# Patient Record
Sex: Female | Born: 1945 | Race: White | Hispanic: No | Smoking: Never smoker
Health system: Southern US, Community
[De-identification: ages and names within clinical notes are randomized; demographics above are authoritative.]

## PROBLEM LIST (undated history)

## (undated) DIAGNOSIS — J019 Acute sinusitis, unspecified: Secondary | ICD-10-CM

## (undated) DIAGNOSIS — E785 Hyperlipidemia, unspecified: Secondary | ICD-10-CM

## (undated) DIAGNOSIS — Z9109 Other allergy status, other than to drugs and biological substances: Secondary | ICD-10-CM

## (undated) DIAGNOSIS — K631 Perforation of intestine (nontraumatic): Secondary | ICD-10-CM

## (undated) DIAGNOSIS — I10 Essential (primary) hypertension: Secondary | ICD-10-CM

## (undated) DIAGNOSIS — J342 Deviated nasal septum: Secondary | ICD-10-CM

## (undated) DIAGNOSIS — K219 Gastro-esophageal reflux disease without esophagitis: Secondary | ICD-10-CM

## (undated) HISTORY — DX: Deviated nasal septum: J34.2

## (undated) HISTORY — PX: CHOLECYSTECTOMY: SHX55

## (undated) HISTORY — DX: Gastro-esophageal reflux disease without esophagitis: K21.9

## (undated) HISTORY — PX: TONSILLECTOMY: SUR1361

## (undated) HISTORY — DX: Perforation of intestine (nontraumatic): K63.1

## (undated) HISTORY — DX: Essential (primary) hypertension: I10

## (undated) HISTORY — PX: TUBAL LIGATION: SHX77

## (undated) HISTORY — DX: Acute sinusitis, unspecified: J01.90

## (undated) HISTORY — PX: HERNIA REPAIR: SHX51

## (undated) HISTORY — DX: Hyperlipidemia, unspecified: E78.5

---

## 1999-12-14 ENCOUNTER — Encounter: Admission: RE | Admit: 1999-12-14 | Discharge: 1999-12-14 | Payer: Self-pay | Admitting: Family Medicine

## 1999-12-14 ENCOUNTER — Encounter: Payer: Self-pay | Admitting: Family Medicine

## 2000-01-25 ENCOUNTER — Other Ambulatory Visit: Admission: RE | Admit: 2000-01-25 | Discharge: 2000-01-25 | Payer: Self-pay | Admitting: Obstetrics and Gynecology

## 2001-02-18 ENCOUNTER — Other Ambulatory Visit: Admission: RE | Admit: 2001-02-18 | Discharge: 2001-02-18 | Payer: Self-pay | Admitting: Obstetrics and Gynecology

## 2002-02-25 ENCOUNTER — Other Ambulatory Visit: Admission: RE | Admit: 2002-02-25 | Discharge: 2002-02-25 | Payer: Self-pay | Admitting: Obstetrics and Gynecology

## 2002-10-02 ENCOUNTER — Encounter: Admission: RE | Admit: 2002-10-02 | Discharge: 2002-10-02 | Payer: Self-pay | Admitting: Family Medicine

## 2002-10-02 ENCOUNTER — Encounter: Payer: Self-pay | Admitting: Family Medicine

## 2004-09-25 DIAGNOSIS — K631 Perforation of intestine (nontraumatic): Secondary | ICD-10-CM

## 2004-09-25 HISTORY — PX: COLON SURGERY: SHX602

## 2004-09-25 HISTORY — DX: Perforation of intestine (nontraumatic): K63.1

## 2005-01-18 ENCOUNTER — Ambulatory Visit: Payer: Self-pay | Admitting: Internal Medicine

## 2005-02-02 ENCOUNTER — Ambulatory Visit: Payer: Self-pay | Admitting: Internal Medicine

## 2005-02-02 ENCOUNTER — Encounter (INDEPENDENT_AMBULATORY_CARE_PROVIDER_SITE_OTHER): Payer: Self-pay | Admitting: Specialist

## 2005-02-02 ENCOUNTER — Inpatient Hospital Stay (HOSPITAL_COMMUNITY): Admission: EM | Admit: 2005-02-02 | Discharge: 2005-02-16 | Payer: Self-pay | Admitting: Emergency Medicine

## 2005-02-03 ENCOUNTER — Ambulatory Visit: Payer: Self-pay | Admitting: Gastroenterology

## 2005-02-24 ENCOUNTER — Ambulatory Visit (HOSPITAL_COMMUNITY): Admission: RE | Admit: 2005-02-24 | Discharge: 2005-02-24 | Payer: Self-pay | Admitting: General Surgery

## 2006-04-05 ENCOUNTER — Other Ambulatory Visit: Admission: RE | Admit: 2006-04-05 | Discharge: 2006-04-05 | Payer: Self-pay | Admitting: Family Medicine

## 2008-02-24 ENCOUNTER — Ambulatory Visit (HOSPITAL_COMMUNITY): Admission: RE | Admit: 2008-02-24 | Discharge: 2008-02-24 | Payer: Self-pay | Admitting: Family Medicine

## 2008-02-24 ENCOUNTER — Ambulatory Visit: Payer: Self-pay | Admitting: *Deleted

## 2008-04-09 ENCOUNTER — Ambulatory Visit: Payer: Self-pay | Admitting: Cardiovascular Disease

## 2008-05-14 ENCOUNTER — Ambulatory Visit: Payer: Self-pay

## 2010-02-16 ENCOUNTER — Encounter (INDEPENDENT_AMBULATORY_CARE_PROVIDER_SITE_OTHER): Payer: Self-pay | Admitting: *Deleted

## 2010-10-27 NOTE — Letter (Signed)
Summary: Colonoscopy-Changed to Office Visit Letter  [facility]  [address] [place], [place] [address]   Phone: [phone]  Fax: [phone]      Feb 16, 2010 MRN: [id number]   Amanda Potter [address], [place]  [address]   Dear Amanda Potter,   According to our records, it is time for you to schedule a Colonoscopy. However, after reviewing your medical record, I feel that an office visit would be most appropriate to more completely evaluate you and determine your need for a repeat procedure.  Please call [phone] (option #2) at your convenience to schedule an office visit. If you have any questions, concerns, or feel that this letter is in error, we would appreciate your call.   Sincerely,  Iva Boop, M.D.  [facility] [phone]

## 2011-02-07 NOTE — Assessment & Plan Note (Signed)
[facility]                            CARDIOLOGY OFFICE NOTE   Amanda Potter, Amanda Potter                     MRN:          [id number]  DATE:04/09/2008                            DOB:          1946-03-18    Amanda Potter returned and she is seen today as a new patient per Dr. Herb Grays.  She has coronary risk factors including positive family history  of hypertension and hypercholesterolemia.  She has had chest pain and  shortness of breath.  Her pain initially occurred on about February 24, 2008.   She describes significant substernal chest pain.  It radiated more to  the right side.  It was associated with heaviness.  She had some  shortness of breath.  There was no PND or orthopnea.  She is a  nonsmoker.  There was no cough or sputum production.  She was referred  to [facility] for an upper extremity duplex, which showed no evidence of  DVT.  The pain in the chest and arm resolved after about 2-3 days.   She does on occasion get exertional dyspnea.  On occasion when she is  short of breath, there is a pressure in her chest.  However, she does a  lot of work in the yard, rides a IT trainer, and works at [employer] and in  general does fairly well without classic angina.   REVIEW OF SYSTEMS:  Otherwise negative.   PAST MEDICAL HISTORY:  Remarkable for hypertension,  hypercholesterolemia, previous gallbladder surgery, and previous  colectomy.   FAMILY HISTORY:  Positive for premature coronary artery disease on her  father's side, he died of a heart attack at a young age, less than 40.  Mother is still alive.   The patient is a Merchandiser, retail at [employer].  She is divorced.  She has 4  children, 1 of her son still lives with her.  She is otherwise fairly  sedentary.  She does not smoke or drink.   The patient has no known allergies.   CURRENT MEDICATIONS:  1. Amlodipine 10 a day.  2. Ramipril 10 a day.  3. Simvastatin 40 a day.  4. Hydrochlorothiazide 12.5  a day.   PHYSICAL EXAMINATION:  GENERAL:  Remarkable for an elderly white female  in no distress.  She looks older than her stated age.  VITAL SIGNS:  Weight is 202, blood pressure 150/80, pulse 90 and  regular, respiratory rate 14, and afebrile.  HEENT:  Unremarkable.  NECK:  Carotids are normal without bruit.  No lymphadenopathy,  thyromegaly, or JVP elevation.  LUNGS:  Clear with good diaphragmatic motion.  No wheezing.  HEART:  S1 and S2 with normal heart sounds.  PMI normal.  ABDOMEN:  Benign.  Bowel sounds positive.  No AAA.  No tenderness.  No  hepatosplenomegaly or hepatojugular reflux.  She is status post  colectomy.  No bruit.  EXTREMITIES:  Distal pulses are intact.  No edema.  NEURO:  Nonfocal.  SKIN:  Warm and dry.  MUSCULOSKELETAL:  No muscular weakness.   Her electrocardiogram shows sinus rhythm with nonspecific ST-T  wave  changes.   IMPRESSION:  1. Chest pain with multiple coronary risk factors.  Followup stress      Myoview.  2. Hypertension, currently well controlled.  Continue low-salt diet as      well as calcium blocker, angiotensin converting enzyme inhibitor,      and diuretic.  3. Hypercholesterolemia.  Continue simvastatin.  Lipid and liver      profile in 6 months.  4. Right upper extremity pain.  Duplex showed no deep vein thrombosis,      possible muscle strain versus rotator cuff injury.  Continue to      treat conservatively.  Continue anti-inflammatories.   The patient will be seen on an as-needed basis so long as her stress  Myoview is normal.     Theron Arista C. Eden Emms, MD, [facility]  Electronically Signed    PCN/MedQ  DD: 04/09/2008  DT: 04/10/2008  Job #: [id number]   cc:   Amanda Potter, M.D.

## 2011-02-10 NOTE — Op Note (Signed)
[name], [name] NO.:  [id number]   MEDICAL RECORD NO.:  [id number]          PATIENT TYPE:  INP   LOCATION:  0156                         FACILITY:  [facility]   PHYSICIAN:  Adolph Pollack, M.D.DATE OF BIRTH:  [birth year]   DATE OF PROCEDURE:  02/02/2005  DATE OF DISCHARGE:                                 OPERATIVE REPORT   PREOPERATIVE DIAGNOSIS:  Perforated colon.   POSTOPERATIVE DIAGNOSIS:  Perforated cecum.   OPERATION/PROCEDURE:  1.  Exploratory laparotomy.  2.  Right colectomy.   SURGEON:  Adolph Pollack, M.D.   ASSISTANT:  Currie Paris, M.D.   ANESTHESIA:  General.   INDICATIONS:  [name] is a 65 year old female who had a screening  colonoscopy today at approximately 2:30.  Developing worsening abdominal  pain, bloating, nausea and vomiting and x-rays were performed demonstrating  a large pneumoperitoneum.  She subsequently was brought to the operating  room for emergency exploratory laparotomy for perforated colon.   DESCRIPTION OF PROCEDURE:  She was brought to the operating room, placed  supine on the operating table and a general anesthetic was administered.  Foley catheter was placed in the bladder and nasogastric tube inserted.  The  abdominal wall was sterilely prepped and draped.   A lower midline incision was made through the skin and subcutaneous tissue,  fascia and peritoneum and a large amount of air was released.  I then  eviscerated the small bowel and examined the sigmoid colon.  Although there  was some inflammatory change and cloudy fluid in the pelvis, there was no  perforation of the sigmoid colon.  I subsequently examined the cecum and saw  a perforation in the cecal area with deserosalization.  I closed the  perforation hole with silk suture.  I subsequently mobilized the proximal  ascending colon and distal ileum.  I performed an ileocecectomy by dividing  the ascending colon just distal to the perforation  and dividing the distal  ileum.  Mesentery was divided between clamps.  She had very fragile tissue  and had a fair amount of oozing.  I sent this specimen off the field.  I  then performed a side-to-side anastomosis between the ileum and ascending  colon.  However, part of the ascending colon was ischemic.  I resected part  of this anastomosis and performed another side-to-side anastomosis and part  of the ascending colon was still ischemic.  At this time I decided to  mobilize the entire right colon and hepatic flexure which I did.  I  subsequently divided the colon with a stapler just distal to the hepatic  flexure, the proximal transverse colon and completed a formal right  hemicolectomy by dividing the mesentery between clamps and ligating the  vessels.  A side-to-side anastomosis between the distal ileum and the  transverse colon was then performed in a stapled fashion.  The remaining  enterotomy was closed with a linear non-cutting stapler.  This was oversewn  with interrupted 3-0 silk sutures and the distal staple line reinforced  with a 3-0 silk suture.  This anastomosis was patent, under no  tension.  The  mesenteric defect was then closed with interrupted 3-0 Vicryl sutures.   Gloves were changed.  The abdominal cavity was copiously irrigated.  Bleeding points were controlled with electrocautery and suture ligation.  Once hemostasis was adequate, I asked for a needle, sponge and instrument  count which was reported to be correct.  The anastomosis was once again  inspected and viability insured.  Intestines were placed back in the  abdominal cavity and omentum placed over them.  The fascia was then closed  with a running #1 PDS suture.  The subcutaneous tissue was irrigated.  The  skin was partially closed with staples and the rest was packed with moist  gauze followed by a dry dressing.   She tolerated the procedure fairly well.  She did have a blood loss of  approximately 1000  mL from just diffuse-type oozing (and she is known to  take aspirin).  She subsequently was taken to the recovery room in critical  condition and will be taken to the step-down/ICU for observation.      TJR/MEDQ  D:  02/02/2005  T:  02/03/2005  Job:  [id number]   cc:   Iva Boop, M.D. [facility]

## 2011-02-10 NOTE — Consult Note (Signed)
NAMEMALIKA, DEMARIO NO.:  [id number]   MEDICAL RECORD NO.:  [id number]          PATIENT TYPE:  EMS   LOCATION:  ED                           FACILITY:  [facility]   PHYSICIAN:  Adolph Pollack, M.D.DATE OF BIRTH:  Mar 13, 1946   DATE OF CONSULTATION:  02/02/2005  DATE OF DISCHARGE:                                   CONSULTATION   REASON FOR CONSULTATION:  Severe abdominal pain, pneumoperitoneum following  colonoscopy.   HISTORY OF PRESENT ILLNESS:  This 66 year old female presented today to  [facility] for a screening colonoscopy.  This occurred about 2:30.  She had trouble evacuating the gas and complained of lower abdominal pain  and distention.  She had some belching but could not pass flatus.  A rectal  tube was tried.  She subsequently had increasing severity of her pain and  began vomiting.  She then had abdominal x-rays performed which showed a  large pneumoperitoneum.  She was then brought to the emergency department,  and I was asked to see her.   PAST MEDICAL HISTORY:  1.  Hypertension.  2.  Hypercholesterolemia.  3.  Sinus disease.   PREVIOUS OPERATIONS:  1.  Cholecystectomy.  2.  Laparoscopic tubal ligation.  3.  Tonsillectomy.   ALLERGIES:  None.   MEDICATIONS:  Hydrochlorothiazide, Altace, Claritin, aspirin, calcium  carbonate, vitamin B12.  She is also on Zocor.   SOCIAL HISTORY:  Per her significant other, no tobacco or alcohol use.  She  does work at [employer].   REVIEW OF SYSTEMS:  It is unobtainable in her current state.   PHYSICAL EXAMINATION:  GENERAL:  A very ill-appearing female, somewhat  sedated, not able to converse much.  She has received some Phenergan.  VITAL SIGNS:  Temperature is 98.1, blood pressure 111/68, pulse 92,  respiratory rate is 28.  EYES:  Extraocular motions are intact.  There is no icterus present.  NECK:  Supple without masses or thyroid enlargement.  RESPIRATORY:  The breath sounds are  shallow but equal and clear.  CARDIOVASCULAR:  Heart demonstrates a regular rate with a regular rhythm.  No JVD noted.  No lower extremity edema.  ABDOMEN:  Distended with some lower abdominal firmness.  Tenderness to  palpation and percussion.  Hypoactive bowel sounds noted.  Right upper  quadrant scar noted.  Subumbilical scar noted.  SKIN:  Pale and cool.  EXTREMITIES:  No edema noted.  Adequate muscle tone.  NEUROLOGIC:  She is somewhat sedated after receiving some Phenergan.   LABORATORY DATA:  Potassium 3.0, glucose 163.  The rest of her electrolytes  within normal limits.  Liver function is normal.  White blood cell count  7500 but with a leftward shift.  Hemoglobin 13.4, INR normal.   Acute abdominal series x-rays demonstrate a large amount of free air  underneath the diaphragms.   IMPRESSION:  Postcolonoscopy colonic perforation.  Did have a biopsy of a  rectosigmoid polyp.   PLAN:  To the operating room for emergency exploratory laparotomy.  Possibilities include primary closure versus partial colectomy and colostomy  or partial  colectomy with anastomosis and diverting loop ileostomy.  I have  discussed the procedure and the risks with her and her son.  The risks  include but not limited to bleeding, infection, wound healing problems,  actual damage to intra-abdominal organs, risk of anesthesia.  They both seem  to understand this and are agreeable to proceeding.      TJR/MEDQ  D:  02/02/2005  T:  02/02/2005  Job:  [id number]   cc:   Iva Boop, M.D. [facility] C. Andrey Campanile, M.D.  [address]  [place]  [place] [address]  Fax: [phone]

## 2011-02-10 NOTE — Discharge Summary (Signed)
Amanda Potter, FAUX NO.:  [id number]   MEDICAL RECORD NO.:  [id number]          PATIENT TYPE:  INP   LOCATION:  1516                         [facility]ACILITY[facility]  [facility]   PHYSICIAN:  Adolph Pollack, M.D.DATE OF BIRTH:  1946/04/16   DATE OF ADMISSION:  02/02/2005  DATE OF DISCHARGE:  02/05/2005                                 DISCHARGE SUMMARY   DISCHARGE DIAGNOSIS:  Right colon perforation followed colonoscopy.   SECONDARY DIAGNOSES:  1.  Hypertension.  2.  Postoperative intraabdominal abscess.  3.  Postoperative ileus.  4.  Hypertension.  5.  Hyperglycemia.   PROCEDURE:  Emergency exploratory laparotomy with right colectomy.   REASON FOR ADMISSION:  This is a 65 year old female who had a screening  colonoscopy in the afternoon, and had lower abdominal pain, nausea and  vomiting after the procedure.  X-ray was performed which demonstrated  pneumoperitoneum.   HOSPITAL COURSE:  She was started on antibiotics, resuscitated with IV  fluids, and taken to the operating room where she underwent emergency  exploratory laparotomy and a right colectomy.  Postoperatively, she had an  NG tube, and was kept on broad-spectrum antibiotics.  Volume resuscitation  was initiated.  She is noted to have hyperglycemia with a blood sugar as  high as 213.  This is covered with some sliding scale insulin.  Her  abdominal wound was left open, and dressing changes were started.  She was  maintained on IV Zosyn, and has continued to have some fever and ileus.  I  was able to DC the NG and Foley on postop day #6, and tried a liquid diet.  She did spike a fever to 102.5 on her 7th postoperative day, and I had a CT  of the abdomen and pelvis performed which demonstrated a large fluid  collection in the right gut.  However, some smaller fluid collections were  in the pelvis along the paraduodenal area.  I had percutaneous drainage  performed of the fluid collection in the right lower  quadrant region, and  cultures performed.  All of these came back sterile.  However, her  temperature did seem to improve some after that.  I changed the antibiotics  to Primaxin for broader coverage, and she has responded to this well.  White  count was normal a few days after this.  She was starting to tolerate a  regular diet, and I started some iron for some blood-loss anemia and  hemoglobin of 8.8.  Repeat CT scan demonstrated improvement at all of the  fluid collections including the one that was drained but also some of the  others that were not.  The drain output was less, and I had to remove the  drain.  She was improving.  Her diet was advanced.  She, subsequently, on  Feb 16, 2005, was afebrile.  The wound was healing well.  She was tolerating  a diet, ambulating independently, and was felt to be ready for discharge.   DISPOSITION:  Discharged home on Feb 16, 2005.  She has been given Cipro and  Flagyl to take for continued antibiotic  coverage as well as iron and  Vicodin.  She is told to continue her home medicines.  Activity restrictions  have been given.  I have suggested that she have her blood sugar checked by  Dr. Andrey Campanile in the near future.  Home health nurses will help with her  dressing changes.  Back to see me in the office in 1-2 weeks.      TJR/MEDQ  D:  02/22/2005  T:  02/22/2005  Job:  [id number]   cc:   [place]. Andrey Campanile, M.D.  [address]  [place]  [place] [address]  Fax: [phone]   Iva Boop, M.D. [facility]

## 2011-02-10 NOTE — H&P (Signed)
NAME:  Amanda Potter, Amanda Potter NO.:  [id number]   MEDICAL RECORD NO.:  [id number]          PATIENT TYPE:  EMS   LOCATION:  ED                           FACILITY:  [facility]   PHYSICIAN:  Iva Boop, M.D. LHCDATE OF BIRTH:  [birth year]   DATE OF ADMISSION:  02/02/2005  DATE OF DISCHARGE:                                HISTORY & PHYSICAL   CHIEF COMPLAINT:  Abdominal pain, vomiting after colonoscopy and  polypectomy.   HISTORY:  Ms. Sivertson is a 65 year old white woman that presented for a  screening colonoscopy secondary to family history of colon cancer (in her  brother). The procedure was uneventful, she did have moderately severe  sigmoid diverticulosis, a rectal polyp was snared with cautery and external  hemorrhoids were noted. She could not pass air well in recovery, did not  respond to rectal tubes and began to have belching and vomiting and  worsening abdominal pain over time. Initially she seemed to improve a little  bit and then worsened. She had quite a few sweats as well. She was  transferred to the emergency department at [facility] after we  gave her 12.5 mg Phenergan IV to relieve her vomiting at the endoscopy  center. She was tender in the lower quadrants, left more than right at first  and then right more than left. Bowel sounds were present but very diminished  it seemed. Acute abdominal series taken here in the emergency department  demonstrates marked free air.   MEDICATIONS:  1.  Zocor 40 mg q.d.  2.  HCTZ 25 mg q.d.  3.  Altace 10 mg q.d.  4.  Claritin p.r.n.  5.  Vitamin B12 q.d.  6.  Calcium, magnesium and zinc supplements q.d.  7.  Mucinex DM as needed.  8.  Aspirin 81 mg per day.  9.  Aleve p.r.n.   ALLERGIES:  No known drug allergies.   PAST MEDICAL HISTORY:  1.  Hypertension.  2.  Tonsillectomy and adenoidectomy.  3.  Cholecystectomy.  4.  Tubal ligation.  5.  Dyslipidemia.   PHYSICAL EXAMINATION:  GENERAL:  At this  point, the patient is sleepy after  her Phenergan, she is arousable but really not conversant.  VITAL SIGNS:  Her pulse is 94, respirations 24, blood pressure 156/102.  LUNGS:  Clear.  HEART:  S1, S2, no rubs or gallops.  ABDOMEN:  Obese, soft, tender in the lower quadrants. There was no clear  rebound initially though she was tender to movement and could not get  comfortable. Bowel sounds were remarkably diminished.  EXTREMITIES:  No edema.   LABORATORY DATA:  Pending at this time.   ASSESSMENT:  1.  Perforated colon after colonoscopy. I am presuming is related to a      ruptured diverticulum. I think that is most likely. The polyp was low      and not that large so I do not think it is a post polypectomy      perforation.  2.  Additional medical problems include hypertension and allergies and her      previous  surgeries as listed and dyslipidemia.   PLAN:  I have called Dr. Abbey Chatters of surgery and he is coming to evaluate  the patient. Unasyn 3 g IV is given stat and intravenous fluids will be  administered. I assume she will need to go to the operating room but will  defer to Dr. Maris Berger judgment at this time. Further plans pending  clinical course. At this point, I have held her blood pressure medicines as  well as her Zocor and we will determine the need for those overtime.      CEG/MEDQ  D:  02/02/2005  T:  02/02/2005  Job:  [id number]   cc:   Adolph Pollack, M.D.  [address]  [place]  [place] [address]   [place]. Andrey Campanile, M.D.  [address]  [place]  [place] [address]  Fax: [phone]

## 2011-04-27 ENCOUNTER — Telehealth: Payer: Self-pay | Admitting: *Deleted

## 2011-04-27 NOTE — Telephone Encounter (Signed)
Called patient and left message to call back and scheduled office visit.

## 2011-05-04 NOTE — Telephone Encounter (Signed)
2nd attempt to contact the patient. Left a message on home voicemail and with emergency contact person. Noted to send the patient a letter.

## 2011-05-05 ENCOUNTER — Telehealth: Payer: Self-pay | Admitting: Gastroenterology

## 2011-05-05 NOTE — Telephone Encounter (Signed)
Patient called back stating she "DOES NOT want to schedule an Office visit with [place]."

## 2012-01-02 ENCOUNTER — Encounter: Payer: Self-pay | Admitting: Internal Medicine

## 2013-01-29 ENCOUNTER — Telehealth (INDEPENDENT_AMBULATORY_CARE_PROVIDER_SITE_OTHER): Payer: Self-pay | Admitting: General Surgery

## 2013-01-29 ENCOUNTER — Ambulatory Visit (INDEPENDENT_AMBULATORY_CARE_PROVIDER_SITE_OTHER): Payer: Medicare Other | Admitting: General Surgery

## 2013-01-29 ENCOUNTER — Encounter (INDEPENDENT_AMBULATORY_CARE_PROVIDER_SITE_OTHER): Payer: Self-pay

## 2013-01-29 ENCOUNTER — Other Ambulatory Visit (INDEPENDENT_AMBULATORY_CARE_PROVIDER_SITE_OTHER): Payer: Self-pay | Admitting: General Surgery

## 2013-01-29 ENCOUNTER — Encounter (INDEPENDENT_AMBULATORY_CARE_PROVIDER_SITE_OTHER): Payer: Self-pay | Admitting: General Surgery

## 2013-01-29 VITALS — BP 152/98 | HR 96 | Resp 16 | Ht 66.25 in | Wt 204.0 lb

## 2013-01-29 DIAGNOSIS — K432 Incisional hernia without obstruction or gangrene: Secondary | ICD-10-CM

## 2013-01-29 NOTE — Patient Instructions (Signed)
Avoid repetitive heavy lifting. 

## 2013-01-29 NOTE — Progress Notes (Signed)
Patient ID: Amanda Potter, female   DOB: Oct 20, 1945, 67 y.o.   MRN: [id number]  No chief complaint on file.   HPI Amanda Potter is a 67 y.o. female.   HPI  She is referred by Mady Gemma, physician's assistant, to evaluate a ventral hernia.  In 2006, she underwent an emergency right hemicolectomy for a perforation following colonoscopy. This was complicated by postoperative abscesses which were treated with antibiotics and drainage and eventually resolved. Over the past 3 years, she's been gaining some weight and has noticed some asymmetric bulging in her lower abdomen which is persistently been getting larger. It does not interfere with her bowel or bladder habits. She denies difficulty with urination or chronic constipation. She was referred here to evaluate this. It does not cause her a lot of pain.  Past Medical History  Diagnosis Date  . Hyperlipidemia   . Hypertension   . GERD (gastroesophageal reflux disease)   . Deviated nasal septum   . Sinusitis, acute     Perforation of colon following colonoscopy in 2006  Past surgical history: Tubal ligation, open cholecystectomy, emergency right hemicolectomy in 2006.  No family history on file.  Social History History  Substance Use Topics  . Smoking status: Never Smoker   . Smokeless tobacco: Not on file  . Alcohol Use: No    No Known Allergies  Current Outpatient Prescriptions  Medication Sig Dispense Refill  . AMLODIPINE BESYLATE PO Take 10 mg by mouth.      Marland Kitchen aspirin 81 MG tablet Take 81 mg by mouth daily.      Marland Kitchen esomeprazole (NEXIUM) 40 MG capsule Take 40 mg by mouth daily before breakfast.      . fish oil-omega-3 fatty acids 1000 MG capsule Take 2 g by mouth daily.      . ramipril (ALTACE) 10 MG capsule Take 10 mg by mouth daily.      . simvastatin (ZOCOR) 20 MG tablet Take 20 mg by mouth every evening.       No current facility-administered medications for this visit.    Review of Systems Review of Systems   Constitutional: Negative.   HENT: Positive for voice change (Hoarseness from postnasal drip.) and postnasal drip.   Respiratory: Positive for cough.   Cardiovascular: Negative.   Gastrointestinal: Positive for abdominal distention. Negative for vomiting and constipation.  Genitourinary: Negative.   Allergic/Immunologic: Negative.   Neurological: Negative.   Hematological: Negative.     Blood pressure 152/98, pulse 96, resp. rate 16, height 5' 6.25" (1.683 m), weight 204 lb (92.534 kg).  Physical Exam Physical Exam  Constitutional: No distress.  Obese female.  HENT:  Head: Normocephalic and atraumatic.  Eyes: EOM are normal. No scleral icterus.  Neck: Neck supple.  Cardiovascular: Normal rate and regular rhythm.   Pulmonary/Chest: Effort normal and breath sounds normal.  Abdominal: She exhibits distension. She exhibits no mass. There is no tenderness.  Right upper quadrant transverse scars present. Long midline scar is present with a reducible bulge and a palpable defect in the fascia. There is asymmetric bulging with the right side being greater than the left side.  Musculoskeletal: She exhibits no edema.  Lymphadenopathy:    She has no cervical adenopathy.  Neurological: She is alert.  Skin: Skin is warm and dry.  Psychiatric: She has a normal mood and affect. Her behavior is normal.    Data Reviewed Ms. Kaplan's note.  Old discharge summary from 2006.  Assessment    Enlarging ventral incisional  hernia. She is interested in repair versus expectant management.     Plan    Will obtain CT scan of the abdomen and pelvis to look at the definite size of the hernia and what it involves. Following this we'll schedule a laparoscopic possible open ventral hernia with mesh.  I have discussed the procedure, risks, and aftercare. Risks include but are not limited to bleeding, infection, wound healing problems, anesthesia, recurrence, accidental injury to intra-abdominal organs-such  as intestine, liver, spleen, bladder, etc. We also discussed the rare complication of mesh rejection. All questions were answered.        Skyelar Swigart J 01/29/2013, 11:01 AM

## 2013-01-29 NOTE — Telephone Encounter (Signed)
Spoke with patient  She is aware of ct scan on 02/04/13 at 930  She was given instructions on how to drink contrast and eating labs will be drawn first

## 2013-01-29 NOTE — Addendum Note (Signed)
Addended by: Milas Hock on: 01/29/2013 11:59 AM   Modules accepted: Orders

## 2013-02-03 ENCOUNTER — Other Ambulatory Visit (INDEPENDENT_AMBULATORY_CARE_PROVIDER_SITE_OTHER): Payer: Self-pay | Admitting: General Surgery

## 2013-02-03 DIAGNOSIS — K432 Incisional hernia without obstruction or gangrene: Secondary | ICD-10-CM

## 2013-02-04 ENCOUNTER — Encounter (INDEPENDENT_AMBULATORY_CARE_PROVIDER_SITE_OTHER): Payer: Self-pay | Admitting: General Surgery

## 2013-02-04 ENCOUNTER — Inpatient Hospital Stay: Admission: RE | Admit: 2013-02-04 | Payer: Self-pay | Source: Ambulatory Visit

## 2013-02-04 ENCOUNTER — Ambulatory Visit
Admission: RE | Admit: 2013-02-04 | Discharge: 2013-02-04 | Disposition: A | Discharge status: Home / Self Care | Payer: Medicare Other | Source: Ambulatory Visit | Attending: General Surgery | Admitting: General Surgery

## 2013-02-04 DIAGNOSIS — K432 Incisional hernia without obstruction or gangrene: Secondary | ICD-10-CM

## 2013-02-04 NOTE — Progress Notes (Signed)
Patient ID: Amanda Potter, female   DOB: [birth year], 67 y.o.   MRN: [id number] CT shows a broad based hernia with small bowel protruding through it.  Discussed this with her.  Operation is schedule for next month.

## 2013-02-18 ENCOUNTER — Encounter (HOSPITAL_COMMUNITY): Payer: Self-pay | Admitting: Pharmacy Technician

## 2013-02-24 ENCOUNTER — Other Ambulatory Visit: Payer: Self-pay

## 2013-02-24 ENCOUNTER — Encounter (HOSPITAL_COMMUNITY)
Admission: RE | Admit: 2013-02-24 | Discharge: 2013-02-24 | Disposition: A | Discharge status: Home / Self Care | Payer: Medicare Other | Source: Ambulatory Visit | Attending: Internal Medicine | Admitting: Internal Medicine

## 2013-02-24 ENCOUNTER — Encounter (HOSPITAL_COMMUNITY): Payer: Self-pay

## 2013-02-24 ENCOUNTER — Ambulatory Visit (HOSPITAL_COMMUNITY)
Admission: RE | Admit: 2013-02-24 | Discharge: 2013-02-24 | Disposition: A | Discharge status: Home / Self Care | Payer: Medicare Other | Source: Ambulatory Visit | Attending: General Surgery | Admitting: General Surgery

## 2013-02-24 DIAGNOSIS — R05 Cough: Secondary | ICD-10-CM | POA: Insufficient documentation

## 2013-02-24 DIAGNOSIS — Z01818 Encounter for other preprocedural examination: Secondary | ICD-10-CM | POA: Insufficient documentation

## 2013-02-24 DIAGNOSIS — Z01810 Encounter for preprocedural cardiovascular examination: Secondary | ICD-10-CM | POA: Insufficient documentation

## 2013-02-24 DIAGNOSIS — K432 Incisional hernia without obstruction or gangrene: Secondary | ICD-10-CM | POA: Insufficient documentation

## 2013-02-24 DIAGNOSIS — Z01812 Encounter for preprocedural laboratory examination: Secondary | ICD-10-CM | POA: Insufficient documentation

## 2013-02-24 DIAGNOSIS — I10 Essential (primary) hypertension: Secondary | ICD-10-CM | POA: Insufficient documentation

## 2013-02-24 DIAGNOSIS — R059 Cough, unspecified: Secondary | ICD-10-CM | POA: Insufficient documentation

## 2013-02-24 HISTORY — DX: Other allergy status, other than to drugs and biological substances: Z91.09

## 2013-02-24 LAB — CBC WITH DIFFERENTIAL/PLATELET
Basophils Absolute: 0 10*3/uL (ref 0.0–0.1)
Basophils Relative: 1 % (ref 0–1)
Eosinophils Absolute: 0.1 10*3/uL (ref 0.0–0.7)
Eosinophils Relative: 2 % (ref 0–5)
HCT: 36.8 % (ref 36.0–46.0)
Hemoglobin: 12.7 g/dL (ref 12.0–15.0)
Lymphocytes Relative: 39 % (ref 12–46)
Lymphs Abs: 2.3 10*3/uL (ref 0.7–4.0)
MCH: 29.4 pg (ref 26.0–34.0)
MCHC: 34.5 g/dL (ref 30.0–36.0)
MCV: 85.2 fL (ref 78.0–100.0)
Monocytes Absolute: 0.4 10*3/uL (ref 0.1–1.0)
Monocytes Relative: 7 % (ref 3–12)
Neutro Abs: 3 10*3/uL (ref 1.7–7.7)
Neutrophils Relative %: 51 % (ref 43–77)
Platelets: 277 10*3/uL (ref 150–400)
RBC: 4.32 MIL/uL (ref 3.87–5.11)
RDW: 12.8 % (ref 11.5–15.5)
WBC: 5.9 10*3/uL (ref 4.0–10.5)

## 2013-02-24 LAB — COMPREHENSIVE METABOLIC PANEL
ALT: 15 U/L (ref 0–35)
AST: 16 U/L (ref 0–37)
Albumin: 3.7 g/dL (ref 3.5–5.2)
Alkaline Phosphatase: 87 U/L (ref 39–117)
BUN: 6 mg/dL (ref 6–23)
Calcium: 9.8 mg/dL (ref 8.4–10.5)
Creatinine, Ser: 0.63 mg/dL (ref 0.50–1.10)
GFR calc Af Amer: >90 mL/min (ref >90)
GFR calc non Af Amer: 89 mL/min — ABNORMAL LOW (ref >90)
Glucose, Bld: 102 mg/dL — ABNORMAL HIGH (ref 70–99)

## 2013-02-24 LAB — ABO/RH: ABO/RH(D): A POS

## 2013-02-24 NOTE — Patient Instructions (Addendum)
Amanda Potter  02/24/2013                           YOUR PROCEDURE IS SCHEDULED ON: 03/03/13               PLEASE REPORT TO SHORT STAY CENTER AT : 5:15 AM               CALL THIS NUMBER IF ANY PROBLEMS THE DAY OF SURGERY :               [phone]                      REMEMBER:   Do not eat food or drink liquids AFTER MIDNIGHT .  Take these medicines the morning of surgery with A SIP OF WATER: NONE   Do not wear jewelry, make-up   Do not wear lotions, powders, or perfumes.   Do not shave legs or underarms 12 hrs. before surgery (men may shave face)  Do not bring valuables to the hospital.  Contacts, dentures or bridgework may not be worn into surgery.  Leave suitcase in the car. After surgery it may be brought to your room.  For patients admitted to the hospital more than one night, checkout time is 11:00                          The day of discharge.   Patients discharged the day of surgery will not be allowed to drive home                             If going home same day of surgery, must have someone stay with you first                           24 hrs at home and arrange for some one to drive you home from hospital.    Special Instructions:   Please read over the following fact sheets that you were given:               1. MRSA  INFORMATION                      2. [facility] PREPARING FOR SURGERY SHEET               3. STOP ASPIRIN / FISH OIL 7 DAYS PREOP                                                X_____________________________________________________________________        Failure to follow these instructions may result in cancellation of your surgery

## 2013-03-03 ENCOUNTER — Encounter (HOSPITAL_COMMUNITY)
Admission: RE | Disposition: A | Discharge status: Home / Self Care | Payer: Self-pay | Source: Ambulatory Visit | Attending: General Surgery

## 2013-03-03 ENCOUNTER — Inpatient Hospital Stay (HOSPITAL_COMMUNITY)
Admission: RE | Admit: 2013-03-03 | Discharge: 2013-03-07 | DRG: 336 | Disposition: A | Discharge status: Home / Self Care | Payer: Medicare Other | Source: Ambulatory Visit | Attending: General Surgery | Admitting: General Surgery

## 2013-03-03 ENCOUNTER — Ambulatory Visit (HOSPITAL_COMMUNITY): Payer: Medicare Other | Admitting: Certified Registered Nurse Anesthetist

## 2013-03-03 ENCOUNTER — Encounter (HOSPITAL_COMMUNITY): Payer: Self-pay | Admitting: Certified Registered Nurse Anesthetist

## 2013-03-03 ENCOUNTER — Encounter (HOSPITAL_COMMUNITY): Payer: Self-pay | Admitting: *Deleted

## 2013-03-03 DIAGNOSIS — I10 Essential (primary) hypertension: Secondary | ICD-10-CM | POA: Diagnosis present

## 2013-03-03 DIAGNOSIS — Z7982 Long term (current) use of aspirin: Secondary | ICD-10-CM

## 2013-03-03 DIAGNOSIS — Z79899 Other long term (current) drug therapy: Secondary | ICD-10-CM

## 2013-03-03 DIAGNOSIS — K432 Incisional hernia without obstruction or gangrene: Principal | ICD-10-CM | POA: Diagnosis present

## 2013-03-03 DIAGNOSIS — K660 Peritoneal adhesions (postprocedural) (postinfection): Secondary | ICD-10-CM | POA: Diagnosis present

## 2013-03-03 DIAGNOSIS — E785 Hyperlipidemia, unspecified: Secondary | ICD-10-CM | POA: Diagnosis present

## 2013-03-03 DIAGNOSIS — K560 Paralytic ileus: Secondary | ICD-10-CM | POA: Diagnosis not present

## 2013-03-03 DIAGNOSIS — K219 Gastro-esophageal reflux disease without esophagitis: Secondary | ICD-10-CM | POA: Diagnosis present

## 2013-03-03 HISTORY — PX: INSERTION OF MESH: SHX5868

## 2013-03-03 HISTORY — PX: INCISIONAL HERNIA REPAIR: SHX193

## 2013-03-03 LAB — TYPE AND SCREEN

## 2013-03-03 SURGERY — REPAIR, HERNIA, INCISIONAL, LAPAROSCOPIC
Anesthesia: General | Site: Abdomen | Wound class: Clean

## 2013-03-03 MED ORDER — 0.9 % SODIUM CHLORIDE (POUR BTL) OPTIME
TOPICAL | Status: DC | PRN
  Administered 2013-03-03: 1000 mL

## 2013-03-03 MED ORDER — OXYCODONE HCL 5 MG/5ML PO SOLN
5.0000 mg | Freq: Once | ORAL | Status: DC | PRN
  Filled 2013-03-03: qty 5

## 2013-03-03 MED ORDER — AMLODIPINE BESYLATE 10 MG PO TABS
10.0000 mg | ORAL_TABLET | Freq: Every day | ORAL | Status: DC
  Administered 2013-03-03 – 2013-03-06 (×4): 10 mg via ORAL
  Filled 2013-03-03 (×7): qty 1

## 2013-03-03 MED ORDER — GLYCOPYRROLATE 0.2 MG/ML IJ SOLN
INTRAMUSCULAR | Status: DC | PRN
  Administered 2013-03-03: .4 mg via INTRAVENOUS

## 2013-03-03 MED ORDER — BENZONATATE 100 MG PO CAPS
100.0000 mg | ORAL_CAPSULE | Freq: Three times a day (TID) | ORAL | Status: DC | PRN
  Filled 2013-03-03: qty 1

## 2013-03-03 MED ORDER — CISATRACURIUM BESYLATE (PF) 10 MG/5ML IV SOLN
INTRAVENOUS | Status: DC | PRN
  Administered 2013-03-03: 6 mg via INTRAVENOUS
  Administered 2013-03-03: 4 mg via INTRAVENOUS
  Administered 2013-03-03: 2 mg via INTRAVENOUS

## 2013-03-03 MED ORDER — CEFAZOLIN SODIUM 1-5 GM-% IV SOLN
1.0000 g | Freq: Four times a day (QID) | INTRAVENOUS | Status: AC
  Administered 2013-03-03 – 2013-03-04 (×3): 1 g via INTRAVENOUS
  Filled 2013-03-03 (×4): qty 50

## 2013-03-03 MED ORDER — ONDANSETRON HCL 4 MG/2ML IJ SOLN
4.0000 mg | INTRAMUSCULAR | Status: DC | PRN
  Administered 2013-03-05: 4 mg via INTRAVENOUS
  Filled 2013-03-03: qty 2

## 2013-03-03 MED ORDER — LACTATED RINGERS IV SOLN: INTRAVENOUS | Status: DC

## 2013-03-03 MED ORDER — ONDANSETRON HCL 4 MG PO TABS: 4.0000 mg | ORAL_TABLET | Freq: Four times a day (QID) | ORAL | Status: DC | PRN

## 2013-03-03 MED ORDER — MORPHINE SULFATE 2 MG/ML IJ SOLN
INTRAMUSCULAR | Status: AC
  Administered 2013-03-03: 2 mg via INTRAVENOUS
  Filled 2013-03-03: qty 1

## 2013-03-03 MED ORDER — OXYCODONE-ACETAMINOPHEN 5-325 MG PO TABS
1.0000 | ORAL_TABLET | ORAL | Status: DC | PRN
  Administered 2013-03-03 – 2013-03-05 (×8): 1 via ORAL
  Administered 2013-03-05: 2 via ORAL
  Administered 2013-03-05 – 2013-03-07 (×6): 1 via ORAL
  Filled 2013-03-03 (×5): qty 1
  Filled 2013-03-03: qty 2
  Filled 2013-03-03 (×6): qty 1
  Filled 2013-03-03: qty 2
  Filled 2013-03-03 (×2): qty 1

## 2013-03-03 MED ORDER — CEFAZOLIN SODIUM-DEXTROSE 2-3 GM-% IV SOLR
2.0000 g | INTRAVENOUS | Status: AC
  Administered 2013-03-03: 2 g via INTRAVENOUS

## 2013-03-03 MED ORDER — LIDOCAINE HCL (CARDIAC) 20 MG/ML IV SOLN
INTRAVENOUS | Status: DC | PRN
  Administered 2013-03-03: 80 mg via INTRAVENOUS

## 2013-03-03 MED ORDER — LACTATED RINGERS IV SOLN
INTRAVENOUS | Status: DC | PRN
  Administered 2013-03-03 (×2): via INTRAVENOUS

## 2013-03-03 MED ORDER — BUPIVACAINE HCL (PF) 0.5 % IJ SOLN
INTRAMUSCULAR | Status: DC | PRN
  Administered 2013-03-03: 15 mL

## 2013-03-03 MED ORDER — ONDANSETRON HCL 4 MG/2ML IJ SOLN
INTRAMUSCULAR | Status: DC | PRN
  Administered 2013-03-03: 4 mg via INTRAVENOUS

## 2013-03-03 MED ORDER — PROPOFOL 10 MG/ML IV BOLUS
INTRAVENOUS | Status: DC | PRN
  Administered 2013-03-03: 180 mg via INTRAVENOUS

## 2013-03-03 MED ORDER — OXYCODONE HCL 5 MG PO TABS: 5.0000 mg | ORAL_TABLET | Freq: Once | ORAL | Status: DC | PRN

## 2013-03-03 MED ORDER — LACTATED RINGERS IR SOLN
Status: DC | PRN
  Administered 2013-03-03: 1

## 2013-03-03 MED ORDER — NEOSTIGMINE METHYLSULFATE 1 MG/ML IJ SOLN
INTRAMUSCULAR | Status: DC | PRN
  Administered 2013-03-03: 4 mg via INTRAVENOUS

## 2013-03-03 MED ORDER — LABETALOL HCL 5 MG/ML IV SOLN
INTRAVENOUS | Status: DC | PRN
  Administered 2013-03-03: 5 mg via INTRAVENOUS

## 2013-03-03 MED ORDER — BUPIVACAINE HCL (PF) 0.5 % IJ SOLN
INTRAMUSCULAR | Status: AC
  Filled 2013-03-03: qty 30

## 2013-03-03 MED ORDER — FENTANYL CITRATE 0.05 MG/ML IJ SOLN
INTRAMUSCULAR | Status: DC | PRN
  Administered 2013-03-03 (×3): 50 ug via INTRAVENOUS
  Administered 2013-03-03: 100 ug via INTRAVENOUS

## 2013-03-03 MED ORDER — ACETAMINOPHEN 10 MG/ML IV SOLN: 1000.0000 mg | Freq: Once | INTRAVENOUS | Status: DC | PRN

## 2013-03-03 MED ORDER — MORPHINE SULFATE 2 MG/ML IJ SOLN
INTRAMUSCULAR | Status: AC
  Filled 2013-03-03: qty 1

## 2013-03-03 MED ORDER — PROMETHAZINE HCL 25 MG/ML IJ SOLN: 6.2500 mg | INTRAMUSCULAR | Status: DC | PRN

## 2013-03-03 MED ORDER — HYDROMORPHONE HCL PF 1 MG/ML IJ SOLN
0.2500 mg | INTRAMUSCULAR | Status: DC | PRN
  Administered 2013-03-03: 0.5 mg via INTRAVENOUS

## 2013-03-03 MED ORDER — CEFAZOLIN SODIUM-DEXTROSE 2-3 GM-% IV SOLR
INTRAVENOUS | Status: AC
  Filled 2013-03-03: qty 50

## 2013-03-03 MED ORDER — SUCCINYLCHOLINE CHLORIDE 20 MG/ML IJ SOLN
INTRAMUSCULAR | Status: DC | PRN
  Administered 2013-03-03: 100 mg via INTRAVENOUS

## 2013-03-03 MED ORDER — MEPERIDINE HCL 50 MG/ML IJ SOLN: 6.2500 mg | INTRAMUSCULAR | Status: DC | PRN

## 2013-03-03 MED ORDER — MIDAZOLAM HCL 5 MG/5ML IJ SOLN
INTRAMUSCULAR | Status: DC | PRN
  Administered 2013-03-03: 2 mg via INTRAVENOUS

## 2013-03-03 MED ORDER — HYDROMORPHONE HCL PF 1 MG/ML IJ SOLN
INTRAMUSCULAR | Status: AC
  Filled 2013-03-03: qty 1

## 2013-03-03 MED ORDER — KCL-LACTATED RINGERS-D5W 20 MEQ/L IV SOLN
INTRAVENOUS | Status: DC
  Administered 2013-03-03 – 2013-03-06 (×6): via INTRAVENOUS
  Filled 2013-03-03 (×14): qty 1000

## 2013-03-03 MED ORDER — MORPHINE SULFATE 10 MG/ML IJ SOLN
2.0000 mg | INTRAMUSCULAR | Status: DC | PRN
  Administered 2013-03-03: 2 mg via INTRAVENOUS
  Administered 2013-03-06: 6 mg via INTRAVENOUS
  Filled 2013-03-03: qty 1

## 2013-03-03 SURGICAL SUPPLY — 49 items
ADH SKN CLS APL DERMABOND .7 (GAUZE/BANDAGES/DRESSINGS)
APL SKNCLS STERI-STRIP NONHPOA (GAUZE/BANDAGES/DRESSINGS) ×1
BANDAGE ADHESIVE 1X3 (GAUZE/BANDAGES/DRESSINGS) ×2 IMPLANT
BENZOIN TINCTURE PRP APPL 2/3 (GAUZE/BANDAGES/DRESSINGS) ×2 IMPLANT
BINDER ABD UNIV 12 45-62 (WOUND CARE) ×1 IMPLANT
BINDER ABDOMINAL 46IN 62IN (WOUND CARE) ×2
CANISTER SUCTION 2500CC (MISCELLANEOUS) ×2 IMPLANT
CANNULA ENDOPATH XCEL 11M (ENDOMECHANICALS) IMPLANT
CHLORAPREP W/TINT 26ML (MISCELLANEOUS) ×2 IMPLANT
CLOTH BEACON ORANGE TIMEOUT ST (SAFETY) ×2 IMPLANT
DECANTER SPIKE VIAL GLASS SM (MISCELLANEOUS) IMPLANT
DERMABOND ADVANCED (GAUZE/BANDAGES/DRESSINGS)
DERMABOND ADVANCED .7 DNX12 (GAUZE/BANDAGES/DRESSINGS) IMPLANT
DEVICE TROCAR PUNCTURE CLOSURE (ENDOMECHANICALS) ×2 IMPLANT
DISSECTOR BLUNT TIP ENDO 5MM (MISCELLANEOUS) IMPLANT
DRAPE INCISE IOBAN 66X45 STRL (DRAPES) ×2 IMPLANT
DRAPE LAPAROSCOPIC ABDOMINAL (DRAPES) ×2 IMPLANT
DRAPE UTILITY XL STRL (DRAPES) ×2 IMPLANT
DRSG TEGADERM 2-3/8X2-3/4 SM (GAUZE/BANDAGES/DRESSINGS) ×8 IMPLANT
ELECT REM PT RETURN 9FT ADLT (ELECTROSURGICAL) ×2
ELECTRODE REM PT RTRN 9FT ADLT (ELECTROSURGICAL) ×1 IMPLANT
GLOVE BIOGEL PI IND STRL 7.0 (GLOVE) ×1 IMPLANT
GLOVE BIOGEL PI INDICATOR 7.0 (GLOVE) ×1
GLOVE ECLIPSE 8.0 STRL XLNG CF (GLOVE) ×4 IMPLANT
GLOVE INDICATOR 8.0 STRL GRN (GLOVE) ×4 IMPLANT
GOWN STRL NON-REIN LRG LVL3 (GOWN DISPOSABLE) ×2 IMPLANT
GOWN STRL REIN XL XLG (GOWN DISPOSABLE) ×6 IMPLANT
HAND ACTIVATED (MISCELLANEOUS) ×2 IMPLANT
KIT BASIN OR (CUSTOM PROCEDURE TRAY) ×2 IMPLANT
MARKER SKIN DUAL TIP RULER LAB (MISCELLANEOUS) ×2 IMPLANT
MESH VENTRALIGHT ST 10X13IN (Mesh General) ×2 IMPLANT
NEEDLE INSUFFLATION 14GA 120MM (NEEDLE) IMPLANT
NEEDLE SPNL 22GX3.5 QUINCKE BK (NEEDLE) ×2 IMPLANT
NS IRRIG 1000ML POUR BTL (IV SOLUTION) ×2 IMPLANT
PENCIL BUTTON HOLSTER BLD 10FT (ELECTRODE) IMPLANT
SCISSORS LAP 5X35 DISP (ENDOMECHANICALS) ×2 IMPLANT
SET IRRIG TUBING LAPAROSCOPIC (IRRIGATION / IRRIGATOR) ×2 IMPLANT
SOLUTION ANTI FOG 6CC (MISCELLANEOUS) ×2 IMPLANT
STRIP CLOSURE SKIN 1/2X4 (GAUZE/BANDAGES/DRESSINGS) ×2 IMPLANT
SUT MNCRL AB 4-0 PS2 18 (SUTURE) ×2 IMPLANT
SUT NOVA NAB DX-16 0-1 5-0 T12 (SUTURE) ×4 IMPLANT
TACKER 5MM HERNIA 3.5CML NAB (ENDOMECHANICALS) ×4 IMPLANT
TOWEL OR 17X26 10 PK STRL BLUE (TOWEL DISPOSABLE) ×4 IMPLANT
TRAY FOLEY CATH 14FRSI W/METER (CATHETERS) ×4 IMPLANT
TRAY LAP CHOLE (CUSTOM PROCEDURE TRAY) ×2 IMPLANT
TROCAR BLADELESS OPT 5 75 (ENDOMECHANICALS) ×8 IMPLANT
TROCAR XCEL BLUNT TIP 100MML (ENDOMECHANICALS) IMPLANT
TROCAR XCEL NON-BLD 11X100MML (ENDOMECHANICALS) ×2 IMPLANT
TUBING INSUFFLATION 10FT LAP (TUBING) ×2 IMPLANT

## 2013-03-03 NOTE — Plan of Care (Signed)
Problem: Phase I Progression Outcomes Goal: OOB as tolerated unless otherwise ordered Outcome: Progressing progressing

## 2013-03-03 NOTE — Interval H&P Note (Signed)
History and Physical Interval Note:  03/03/2013 7:41 AM  Amanda Potter  has presented today for surgery, with the diagnosis of  incisional hernia  The various methods of treatment have been discussed with the patient and family. After consideration of risks, benefits and other options for treatment, the patient has consented to  Procedure(s): LAPAROSCOPIC POSSIBLE OPEN INCISIONAL HERNIA REPAIR (N/A) INSERTION OF MESH (N/A) as a surgical intervention .  The patient's history has been reviewed, patient examined, no change in status, stable for surgery.  I have reviewed the patient's chart and labs.  Questions were answered to the patient's satisfaction.     [name] [name]

## 2013-03-03 NOTE — Anesthesia Postprocedure Evaluation (Signed)
Anesthesia Post Note  Patient: Amanda Potter  Procedure(s) Performed: Procedure(s) (LRB): LAPAROSCOPIC INCISIONAL HERNIA REPAIR (N/A) INSERTION OF MESH (N/A)  Anesthesia type: General  Patient location: PACU  Post pain: Pain level controlled  Post assessment: Post-op Vital signs reviewed  Last Vitals: BP 135/85  Pulse 83  Temp(Src) 36.9 C (Oral)  Resp 18  Ht 5\' 6"  (1.676 m)  Wt 217 lb 3.7 oz (98.534 kg)  BMI 35.08 kg/m2  SpO2 98%  Post vital signs: Reviewed  Level of consciousness: sedated  Complications: No apparent anesthesia complications

## 2013-03-03 NOTE — Op Note (Signed)
Operative Note  Amanda Potter female  67 y.o. 03/03/2013  PREOPERATIVE DX:  Ventral Incisional Hernia  POSTOPERATIVE DX:  Same  PROCEDURE:  Laparoscopic repair of ventral incisional hernia (19 cm x 12 cm) with composite mesh.  Laparoscopic lysis of adhesions for one hour.         Surgeon: Adolph Pollack   Assistants: Estelle Grumbles M.D.  Anesthesia: General endotracheal anesthesia  Indications:   This is a 67 year old female who underwent an emergency right hemicolectomy in the past. She has an enlarging ventral incisional hernia containing small intestine. She now presents for repair. The procedure, risks, and after care were discussed with her preoperatively.    Procedure Detail:  She was brought to the operating room placed supine on the operating table and a general anesthetic was administered. Some of the hair in the groin was clipped. A Foley catheter was inserted. An oral gastric tube was inserted through the abdominal wall was widely sterilely prepped and draped.  She was placed in slight reverse Trendelenburg position. A 5 mm incision was made in the left subcostal area. Using a 5 mm Optiview trocar a laparoscope access was gained to the peritoneal cavity. A pneumoperitoneum was created. The laparoscope was introduced into the peritoneal cavity and there was no evidence of underlying organ injury or bleeding. Multiple adhesions were noted between the omentum and anterior abdominal wall. A hernia containing omentum and bowel was noted in the lower midline area.  A 5 mm trocar was placed in the left midabdomen and in the left lower quadrant. Using sharp dissection, blunt dissection, and harmonic scalpel lysis of adhesions was performed between the omentum and hernia sac and omentum and anterior abdominal wall. The small bowel was able to be reduced out of the hernia. Adhesions were separated from the abdominal wall the entire midline area as well as the right side of the midline.   An 11 mm trocar was then placed in the right upper quadrant. A 5 mm trocar was placed in the right lower quadrant. There were adhesions between the liver and anterior abdominal wall and these were divided sharply along the liver to fall back. Fatty liver was noted. Lysis of adhesions took approximately one hour.  I inspected all quadrants and the intestine and noted no evidence of bleeding or organ injury.    Using a spinal needle, the edges the hernia were marked. I marked 4 cm back from this. A piece of polypropylene composite mesh was then brought into the field and cut to appropriate size. 8 anchoring sutures of #1 Novofil were placed around the edges of the mesh. The rough side of the mesh was marked. The mesh was then hydrated and then placed into the abdominal cavity Through the right upper quadrant incision. The mesh was deployed so that the nonadherent side faced the viscera and the rough side placed abdominal wall. 8 small incisions were then made around the abdominal wall. The anchoring sutures were then pulled up across a fascial bridge and tied down anchoring the mesh to the abdominal wall with the rough side facing the abdominal wall. Using the spiral tacker the mesh was further anchored with an outer rim and an inner rim of tacks. This provided for good coverage with good overlap of the hernia defect.  Four-quadrant incidental inspection was then performed. There is no evidence of bleeding or organ injury.  CO2 gas was released and I watched the mesh approximate the viscera. The trocars were removed.  All skin  incisions were closed with 4-0 Monocryl subcuticular stitches. Steri-Strips and sterile dressings were applied.  She tolerated the procedure without apparent complications and was taken to the recovery room in satisfactory condition.   Estimated Blood Loss:  200 mL         Drains: none  Blood Given: none          Specimens: none        Complications:  * No complications  entered in OR log *         Disposition: PACU - hemodynamically stable.         Condition: stable

## 2013-03-03 NOTE — Transfer of Care (Signed)
Immediate Anesthesia Transfer of Care Note  Patient: Amanda Potter  Procedure(s) Performed: Procedure(s) with comments: LAPAROSCOPIC INCISIONAL HERNIA REPAIR (N/A) - LYSIS OF ADHESIONS FOR 60 MINUTES INSERTION OF MESH (N/A)  Patient Location: PACU  Anesthesia Type:General  Level of Consciousness: awake, alert  and sedated  Airway & Oxygen Therapy: Patient Spontanous Breathing and Patient connected to face mask oxygen  Post-op Assessment: Report given to PACU RN  Post vital signs: Reviewed and stable  Complications: No apparent anesthesia complications

## 2013-03-03 NOTE — H&P (Signed)
Amanda Potter is an 67 y.o. female.   Chief Complaint:   Here for elective surgery HPI:  She has an enlarging ventral incisional hernia following an emergency right colectomy in the past.  Past Medical History  Diagnosis Date  . Hyperlipidemia   . Hypertension   . GERD (gastroesophageal reflux disease)   . Deviated nasal septum   . Sinusitis, acute   . Perforation of colon 2006    Following colonoscopy  . Environmental allergies     Past Surgical History  Procedure Laterality Date  . Colon surgery Right 2006    hemicolectomy  . Tubal ligation    . Cholecystectomy    . Tonsillectomy      History reviewed. No pertinent family history. Social History:  reports that she has never smoked. She does not have any smokeless tobacco history on file. She reports that she does not drink alcohol or use illicit drugs.  Allergies: No Known Allergies  Medications Prior to Admission  Medication Sig Dispense Refill  . amLODipine (NORVASC) 10 MG tablet Take 10 mg by mouth at bedtime.      Marland Kitchen aspirin 81 MG tablet Take 81 mg by mouth daily.      . benzonatate (TESSALON) 100 MG capsule Take 100 mg by mouth 3 (three) times daily as needed for cough.      . Carboxymethylcellul-Glycerin (OPTIVE) 0.5-0.9 % SOLN Apply 1 drop to eye daily as needed (for dry eyes).      . fish oil-omega-3 fatty acids 1000 MG capsule Take 1 g by mouth daily.       . naproxen sodium (ANAPROX) 220 MG tablet Take 220 mg by mouth 2 (two) times daily as needed (for pain).      . ramipril (ALTACE) 10 MG capsule Take 10 mg by mouth daily.      . simvastatin (ZOCOR) 20 MG tablet Take 20 mg by mouth at bedtime.         No results found for this or any previous visit (from the past 48 hour(s)). No results found.  Review of Systems  Constitutional: Negative for fever and chills.  Respiratory: Positive for cough.   Gastrointestinal: Negative for nausea, vomiting and diarrhea.    Blood pressure 140/85, pulse 84, temperature  97.8 F (36.6 C), temperature source Oral, resp. rate 18, SpO2 99.00%. Physical Exam  Constitutional: No distress.  Obese.  HENT:  Head: Normocephalic and atraumatic.  Neck: Neck supple.  Cardiovascular: Normal rate.   Respiratory: Effort normal and breath sounds normal.  GI: There is no tenderness.  Long midline scar with inferior bulge the deviates to the right.  RUQ scar.  Musculoskeletal: She exhibits no edema.  Lymphadenopathy:    She has no cervical adenopathy.  Neurological: She is alert.  Skin: Skin is warm and dry.     Assessment/Plan Enlarging ventral incisional hernia  Plan:  Laparoscopic possible open repair.  Haunani Dickard J 03/03/2013, 7:39 AM

## 2013-03-03 NOTE — Anesthesia Preprocedure Evaluation (Addendum)
Anesthesia Evaluation  Patient identified by MRN, date of birth, ID band Patient awake    Reviewed: Allergy & Precautions, H&P , NPO status , Patient's Chart, lab work & pertinent test results  Airway Mallampati: II TM Distance: >3 FB Neck ROM: Full    Dental  (+) Dental Advisory Given and Teeth Intact   Pulmonary neg pulmonary ROS,  breath sounds clear to auscultation  Pulmonary exam normal       Cardiovascular hypertension, Pt. on medications Rhythm:Regular Rate:Normal     Neuro/Psych negative neurological ROS  negative psych ROS   GI/Hepatic Neg liver ROS, GERD-  Medicated,  Endo/Other  negative endocrine ROS  Renal/GU negative Renal ROS     Musculoskeletal negative musculoskeletal ROS (+)   Abdominal   Peds  Hematology negative hematology ROS (+)   Anesthesia Other Findings   Reproductive/Obstetrics negative OB ROS                          Anesthesia Physical Anesthesia Plan  ASA: II  Anesthesia Plan: General   Post-op Pain Management:    Induction: Intravenous  Airway Management Planned: Oral ETT  Additional Equipment:   Intra-op Plan:   Post-operative Plan: Extubation in OR  Informed Consent: I have reviewed the patients History and Physical, chart, labs and discussed the procedure including the risks, benefits and alternatives for the proposed anesthesia with the patient or authorized representative who has indicated his/her understanding and acceptance.   Dental advisory given  Plan Discussed with: CRNA  Anesthesia Plan Comments:         Anesthesia Quick Evaluation

## 2013-03-03 NOTE — Anesthesia Procedure Notes (Signed)
Procedure Name: Intubation Date/Time: 03/03/2013 7:53 AM Performed by: Hulan Fess Pre-anesthesia Checklist: Patient identified, Emergency Drugs available, Suction available, Patient being monitored and Timeout performed Patient Re-evaluated:Patient Re-evaluated prior to inductionOxygen Delivery Method: Circle system utilized Preoxygenation: Pre-oxygenation with 100% oxygen Intubation Type: IV induction Ventilation: Mask ventilation without difficulty Laryngoscope Size: Mac and 3 Grade View: Grade II Tube type: Oral Tube size: 7.5 mm Number of attempts: 1 Placement Confirmation: ETT inserted through vocal cords under direct vision,  positive ETCO2 and breath sounds checked- equal and bilateral Secured at: 22.5 cm Dental Injury: Teeth and Oropharynx as per pre-operative assessment

## 2013-03-04 ENCOUNTER — Encounter (HOSPITAL_COMMUNITY): Payer: Self-pay | Admitting: General Surgery

## 2013-03-04 NOTE — Care Management Note (Signed)
    Page 1 of 1   03/04/2013     10:52:57 AM   CARE MANAGEMENT NOTE 03/04/2013  Patient:  Amanda Potter, Amanda Potter   Account Number:  [id number]  Date Initiated:  03/04/2013  Documentation initiated by:  Lorenda Ishihara  Subjective/Objective Assessment:   67 yo female admitted s/p ventral hernia repair. PTA lived at home with family.     Action/Plan:   Home when stable   Anticipated DC Date:  03/06/2013   Anticipated DC Plan:  HOME/SELF CARE      DC Planning Services  CM consult      Choice offered to / List presented to:             Status of service:  Completed, signed off Medicare Important Message given?   (If response is "NO", the following Medicare IM given date fields will be blank) Date Medicare IM given:   Date Additional Medicare IM given:    Discharge Disposition:  HOME/SELF CARE  Per UR Regulation:  Reviewed for med. necessity/level of care/duration of stay  If discussed at Long Length of Stay Meetings, dates discussed:    Comments:

## 2013-03-04 NOTE — Progress Notes (Signed)
1 Day Post-Op  Subjective: Very sore.  Difficult to move because of the pain.  Has not been out of bed yet.  Objective: Vital signs in last 24 hours: Temp:  [97.5 F (36.4 C)-99.4 F (37.4 C)] 98.4 F (36.9 C) (06/10 0553) Pulse Rate:  [72-96] 90 (06/10 0553) Resp:  [16-21] 18 (06/10 0553) BP: (95-139)/(54-85) 122/75 mmHg (06/10 0553) SpO2:  [96 %-100 %] 99 % (06/10 0553) Weight:  [217 lb 3.7 oz (98.534 kg)] 217 lb 3.7 oz (98.534 kg) (06/09 1300)    Intake/Output from previous day: 06/09 0701 - 06/10 0700 In: 3905 [P.O.:580; I.V.:3275; IV Piggyback:50] Out: 1660 [Urine:1660] Intake/Output this shift:    PE: General- In NAD Abdomen-soft, dressings dry  Lab Results:  No results found for this basename: WBC, HGB, HCT, PLT,  in the last 72 hours BMET No results found for this basename: NA, K, CL, CO2, GLUCOSE, BUN, CREATININE, CALCIUM,  in the last 72 hours PT/INR No results found for this basename: LABPROT, INR,  in the last 72 hours Comprehensive Metabolic Panel:    Component Value Date/Time   NA 140 02/24/2013 1120   K 4.2 02/24/2013 1120   CL 104 02/24/2013 1120   CO2 27 02/24/2013 1120   BUN 6 02/24/2013 1120   CREATININE 0.63 02/24/2013 1120   GLUCOSE 102* 02/24/2013 1120   CALCIUM 9.8 02/24/2013 1120   AST 16 02/24/2013 1120   ALT 15 02/24/2013 1120   ALKPHOS 87 02/24/2013 1120   BILITOT 0.4 02/24/2013 1120   PROT 7.2 02/24/2013 1120   ALBUMIN 3.7 02/24/2013 1120     Studies/Results: No results found.  Anti-infectives: Anti-infectives   Start     Dose/Rate Route Frequency Ordered Stop   03/03/13 1400  ceFAZolin (ANCEF) IVPB 1 g/50 mL premix     1 g 100 mL/hr over 30 Minutes Intravenous Every 6 hours 03/03/13 1139 03/04/13 0224   03/03/13 0548  ceFAZolin (ANCEF) IVPB 2 g/50 mL premix     2 g 100 mL/hr over 30 Minutes Intravenous On call to O.R. 03/03/13 0548 03/03/13 0745      Assessment Active Problems:   S/p lap VIR repair with mesh 03/03/13-still with significant postop  pain    LOS: 1 day   Plan: Mobilize.  Remove foley tomorrow.   Amanda Potter J 03/04/2013

## 2013-03-05 ENCOUNTER — Encounter (HOSPITAL_COMMUNITY): Payer: Self-pay | Admitting: *Deleted

## 2013-03-05 NOTE — Progress Notes (Signed)
2 Days Post-Op  Subjective: Having some nausea.  Not eating much.  Passed a little gas. Pain not as bad. Objective: Vital signs in last 24 hours: Temp:  [98.1 F (36.7 C)-98.8 F (37.1 C)] 98.5 F (36.9 C) (06/11 0500) Pulse Rate:  [90-100] 100 (06/11 0500) Resp:  [14-18] 16 (06/11 0500) BP: (130-147)/(74-83) 138/77 mmHg (06/11 0500) SpO2:  [97 %-98 %] 98 % (06/11 0500)    Intake/Output from previous day: 06/10 0701 - 06/11 0700 In: 3120 [P.O.:720; I.V.:2400] Out: 4800 [Urine:4800] Intake/Output this shift: Total I/O In: -  Out: 400 [Urine:400]  PE: General- In NAD Abdomen-soft, slightly distended, dressings dry, hypoactive bowel sounds  Lab Results:  No results found for this basename: WBC, HGB, HCT, PLT,  in the last 72 hours BMET No results found for this basename: NA, K, CL, CO2, GLUCOSE, BUN, CREATININE, CALCIUM,  in the last 72 hours PT/INR No results found for this basename: LABPROT, INR,  in the last 72 hours Comprehensive Metabolic Panel:    Component Value Date/Time   NA 140 02/24/2013 1120   K 4.2 02/24/2013 1120   CL 104 02/24/2013 1120   CO2 27 02/24/2013 1120   BUN 6 02/24/2013 1120   CREATININE 0.63 02/24/2013 1120   GLUCOSE 102* 02/24/2013 1120   CALCIUM 9.8 02/24/2013 1120   AST 16 02/24/2013 1120   ALT 15 02/24/2013 1120   ALKPHOS 87 02/24/2013 1120   BILITOT 0.4 02/24/2013 1120   PROT 7.2 02/24/2013 1120   ALBUMIN 3.7 02/24/2013 1120     Studies/Results: No results found.  Anti-infectives: Anti-infectives   Start     Dose/Rate Route Frequency Ordered Stop   03/03/13 1400  ceFAZolin (ANCEF) IVPB 1 g/50 mL premix     1 g 100 mL/hr over 30 Minutes Intravenous Every 6 hours 03/03/13 1139 03/04/13 0224   03/03/13 0548  ceFAZolin (ANCEF) IVPB 2 g/50 mL premix     2 g 100 mL/hr over 30 Minutes Intravenous On call to O.R. 03/03/13 0548 03/03/13 0745      Assessment Active Problems:   S/p lap VIR repair with mesh 03/03/13-some post op ileus    LOS: 2 days    Plan:  Keep on clear liquids until nausea and bowel function improve.   Gaius Ishaq J 03/05/2013

## 2013-03-06 MED ORDER — MORPHINE SULFATE 10 MG/ML IJ SOLN: 2.0000 mg | INTRAMUSCULAR | Status: DC | PRN

## 2013-03-06 NOTE — Progress Notes (Signed)
3 Days Post-Op  Subjective: Having less pain.  Passing a lot of gas. Objective: Vital signs in last 24 hours: Temp:  [97.6 F (36.4 C)-98.4 F (36.9 C)] 97.6 F (36.4 C) (06/12 0520) Pulse Rate:  [97-100] 97 (06/12 0520) Resp:  [20] 20 (06/12 0520) BP: (135-144)/(80-84) 135/83 mmHg (06/12 0520) SpO2:  [93 %-96 %] 96 % (06/12 0520)    Intake/Output from previous day: 06/11 0701 - 06/12 0700 In: 3000 [P.O.:1200; I.V.:1800] Out: 3050 [Urine:3050] Intake/Output this shift:    PE: General- In NAD Abdomen-soft, no distension, incisions are clean and intact Lab Results:  No results found for this basename: WBC, HGB, HCT, PLT,  in the last 72 hours BMET No results found for this basename: NA, K, CL, CO2, GLUCOSE, BUN, CREATININE, CALCIUM,  in the last 72 hours PT/INR No results found for this basename: LABPROT, INR,  in the last 72 hours Comprehensive Metabolic Panel:    Component Value Date/Time   NA 140 02/24/2013 1120   K 4.2 02/24/2013 1120   CL 104 02/24/2013 1120   CO2 27 02/24/2013 1120   BUN 6 02/24/2013 1120   CREATININE 0.63 02/24/2013 1120   GLUCOSE 102* 02/24/2013 1120   CALCIUM 9.8 02/24/2013 1120   AST 16 02/24/2013 1120   ALT 15 02/24/2013 1120   ALKPHOS 87 02/24/2013 1120   BILITOT 0.4 02/24/2013 1120   PROT 7.2 02/24/2013 1120   ALBUMIN 3.7 02/24/2013 1120     Studies/Results: No results found.  Anti-infectives: Anti-infectives   Start     Dose/Rate Route Frequency Ordered Stop   03/03/13 1400  ceFAZolin (ANCEF) IVPB 1 g/50 mL premix     1 g 100 mL/hr over 30 Minutes Intravenous Every 6 hours 03/03/13 1139 03/04/13 0224   03/03/13 0548  ceFAZolin (ANCEF) IVPB 2 g/50 mL premix     2 g 100 mL/hr over 30 Minutes Intravenous On call to O.R. 03/03/13 0548 03/03/13 0745      Assessment Active Problems:   S/p lap VIR repair with mesh 03/03/13-improving slowly    LOS: 3 days   Plan:  Advance diet.  Oral analgesic.  Decrease IVF.   Vang Kraeger J 03/06/2013

## 2013-03-07 MED ORDER — OXYCODONE-ACETAMINOPHEN 5-325 MG PO TABS: 1.0000 | ORAL_TABLET | ORAL | Status: DC | PRN

## 2013-03-07 NOTE — Progress Notes (Signed)
Patient states understanding of discharge instructions. Rx for percocet given.

## 2013-03-07 NOTE — Progress Notes (Signed)
4 Days Post-Op  Subjective: Bowels are moving.  Tolerating diet.  Moving easier. Objective: Vital signs in last 24 hours: Temp:  [98.1 F (36.7 C)-98.7 F (37.1 C)] 98.1 F (36.7 C) (06/13 0620) Pulse Rate:  [95-105] 97 (06/13 0620) Resp:  [18] 18 (06/13 0620) BP: (112-137)/(73-85) 115/73 mmHg (06/13 0620) SpO2:  [96 %-99 %] 99 % (06/13 0620) Last BM Date: 03/06/13  Intake/Output from previous day: 06/12 0701 - 06/13 0700 In: 2900 [P.O.:840; I.V.:2060] Out: 3250 [Urine:3250] Intake/Output this shift:    PE: General- In NAD Abdomen-soft, no distension, incisions are clean and intact Lab Results:  No results found for this basename: WBC, HGB, HCT, PLT,  in the last 72 hours BMET No results found for this basename: NA, K, CL, CO2, GLUCOSE, BUN, CREATININE, CALCIUM,  in the last 72 hours PT/INR No results found for this basename: LABPROT, INR,  in the last 72 hours Comprehensive Metabolic Panel:    Component Value Date/Time   NA 140 02/24/2013 1120   K 4.2 02/24/2013 1120   CL 104 02/24/2013 1120   CO2 27 02/24/2013 1120   BUN 6 02/24/2013 1120   CREATININE 0.63 02/24/2013 1120   GLUCOSE 102* 02/24/2013 1120   CALCIUM 9.8 02/24/2013 1120   AST 16 02/24/2013 1120   ALT 15 02/24/2013 1120   ALKPHOS 87 02/24/2013 1120   BILITOT 0.4 02/24/2013 1120   PROT 7.2 02/24/2013 1120   ALBUMIN 3.7 02/24/2013 1120     Studies/Results: No results found.  Anti-infectives: Anti-infectives   Start     Dose/Rate Route Frequency Ordered Stop   03/03/13 1400  ceFAZolin (ANCEF) IVPB 1 g/50 mL premix     1 g 100 mL/hr over 30 Minutes Intravenous Every 6 hours 03/03/13 1139 03/04/13 0224   03/03/13 0548  ceFAZolin (ANCEF) IVPB 2 g/50 mL premix     2 g 100 mL/hr over 30 Minutes Intravenous On call to O.R. 03/03/13 0548 03/03/13 0745      Assessment Active Problems:   S/p lap VIR repair with mesh 03/03/13-continues to improve.    LOS: 4 days   Plan:  Discharge.  Instructions given.   Amanda Suchan  Potter 03/07/2013

## 2013-03-07 NOTE — Discharge Summary (Signed)
Physician Discharge Summary  Patient ID: Amanda Potter MRN: [id number] DOB/AGE: 27-Mar-1946 67 y.o.  Admit date: 03/03/2013 Discharge date: 03/07/2013  Admission Diagnoses:  Large ventral incisional hernia  Discharge Diagnoses:  Principal Problem:   Incisional hernia, without obstruction or gangrene s/p laparoscopic repair 03/03/13   Discharged Condition: good  Hospital Course: She underwent laparoscopic repair of a large ventral incisional hernia 03/03/2013. Initially, she had difficulty getting out of bed because of pain issues. She also had some nausea. Over time he slowly improved. Her bowels started moving. She is tolerating her diet. She is able to get out of bed independently and comfortably. Her incisions were clean and intact and the hernia repair is solid. She was able to be discharged 03/07/2013. Specific discharge instructions were given to her.  Consults: None  Significant Diagnostic Studies: none  Treatments: surgery: Laparoscopic ventral incisional hernia repair mesh  Discharge Exam: Blood pressure 115/73, pulse 97, temperature 98.1 F (36.7 C), temperature source Oral, resp. rate 18, height 5\' 6"  (1.676 m), weight 217 lb 3.7 oz (98.534 kg), SpO2 99.00%.   Disposition: 01-Home or Self Care   Future Appointments Provider Department Dept Phone   03/17/2013 10:40 AM Adolph Pollack, MD [facility], [place] [phone]       Medication List    TAKE these medications       amLODipine 10 MG tablet  Commonly known as:  NORVASC  Take 10 mg by mouth at bedtime.     aspirin 81 MG tablet  Take 81 mg by mouth daily.     benzonatate 100 MG capsule  Commonly known as:  TESSALON  Take 100 mg by mouth 3 (three) times daily as needed for cough.     fish oil-omega-3 fatty acids 1000 MG capsule  Take 1 g by mouth daily.     naproxen sodium 220 MG tablet  Commonly known as:  ANAPROX  Take 220 mg by mouth 2 (two) times daily as needed (for pain).     OPTIVE  0.5-0.9 % Soln  Generic drug:  Carboxymethylcellul-Glycerin  Apply 1 drop to eye daily as needed (for dry eyes).     oxyCODONE-acetaminophen 5-325 MG per tablet  Commonly known as:  PERCOCET/ROXICET  Take 1-2 tablets by mouth every 4 (four) hours as needed.     ramipril 10 MG capsule  Commonly known as:  ALTACE  Take 10 mg by mouth daily.     simvastatin 20 MG tablet  Commonly known as:  ZOCOR  Take 20 mg by mouth at bedtime.         Signed: Adolph Pollack 03/07/2013, 2:02 PM

## 2013-03-17 ENCOUNTER — Encounter (INDEPENDENT_AMBULATORY_CARE_PROVIDER_SITE_OTHER): Payer: Self-pay | Admitting: General Surgery

## 2013-03-17 ENCOUNTER — Ambulatory Visit (INDEPENDENT_AMBULATORY_CARE_PROVIDER_SITE_OTHER): Payer: Medicare Other | Admitting: General Surgery

## 2013-03-17 VITALS — BP 108/68 | HR 90 | Temp 97.4°F | Resp 16 | Ht 66.0 in | Wt 198.4 lb

## 2013-03-17 DIAGNOSIS — Z9889 Other specified postprocedural states: Secondary | ICD-10-CM

## 2013-03-17 NOTE — Patient Instructions (Signed)
Continue light activities. 

## 2013-03-17 NOTE — Progress Notes (Signed)
Procedure:  Laparoscopic repair of ventral incisional hernia  Date:  03/03/2013  Pathology: na  History:  She is here for her first postoperative visit. She is moving better. She is less sore. Bowels are working.  Exam: General- Is in NAD.  Abdomen-soft, incisions are clean and intact with Steri-Strips present, hernia repair and appears solid.  Assessment:  Doing well postoperatively.  Plan:  Continue light activities for now. Return visit in one month.

## 2013-04-14 ENCOUNTER — Encounter (INDEPENDENT_AMBULATORY_CARE_PROVIDER_SITE_OTHER): Payer: Self-pay | Admitting: General Surgery

## 2013-04-14 ENCOUNTER — Ambulatory Visit (INDEPENDENT_AMBULATORY_CARE_PROVIDER_SITE_OTHER): Payer: Medicare Other | Admitting: General Surgery

## 2013-04-14 VITALS — BP 140/80 | HR 88 | Resp 14 | Ht 66.0 in | Wt 195.8 lb

## 2013-04-14 DIAGNOSIS — Z9889 Other specified postprocedural states: Secondary | ICD-10-CM

## 2013-04-14 NOTE — Progress Notes (Signed)
Procedure:  Laparoscopic repair of ventral incisional hernia  Date:  03/03/2013  Pathology: na  History:  She is here for her second postoperative visit.  She is slowly improving. She is slowly regaining her strength. Exam: General- Is in NAD.  Abdomen-soft, incisions are clean and intact, hernia repair  appears solid.  Assessment:  Continues to improve postoperatively.  Plan:  Start to resume her usual activities. Return visit one month.

## 2013-04-14 NOTE — Patient Instructions (Signed)
May resume your usual activities as tolerated as we discussed.

## 2013-05-16 ENCOUNTER — Encounter (INDEPENDENT_AMBULATORY_CARE_PROVIDER_SITE_OTHER): Payer: Self-pay | Admitting: General Surgery

## 2013-05-16 ENCOUNTER — Ambulatory Visit (INDEPENDENT_AMBULATORY_CARE_PROVIDER_SITE_OTHER): Payer: Medicare Other | Admitting: General Surgery

## 2013-05-16 VITALS — BP 158/100 | HR 80 | Temp 98.4°F | Resp 15 | Ht 66.0 in | Wt 192.0 lb

## 2013-05-16 DIAGNOSIS — Z9889 Other specified postprocedural states: Secondary | ICD-10-CM

## 2013-05-16 NOTE — Patient Instructions (Signed)
Avoid weight gain. Avoid repetitive heavy lifting. Activities as tolerated as we discussed.

## 2013-05-16 NOTE — Progress Notes (Signed)
Procedure:  Laparoscopic repair of ventral incisional hernia  Date:  03/03/2013  Pathology: na  History:  She is here for another postoperative visit.  She is doing well and has regained her strength. Exam: General- Is in NAD.  Abdomen-soft, incisions are clean and intact, hernia repair  is solid.  Assessment:  Doing well.  Plan: Activities as tolerated. Return when necessary.

## 2013-07-31 ENCOUNTER — Other Ambulatory Visit: Payer: Self-pay

## 2015-05-08 IMAGING — CT CT ABD-PELV W/O CM
3 of 4 series · 13 of 36 positions shown, 19 images · non-contrast
Comparison: 02/24/2005 CT

CLINICAL DATA: 67-year-old female with abdominal and pelvic pain.
Large incisional hernia.

CT ABDOMEN AND PELVIS WITHOUT CONTRAST
TECHNIQUE: Multidetector CT imaging of the abdomen and pelvis was
performed following the standard protocol without intravenous
contrast.

[Series 3: abd/pelvis w/o · axial · non-contrast · 0.86mm/px · z∈[-440,-55]mm · 8 of 99 slices shown, 13 images]
[im 11/99  soft-tissue]
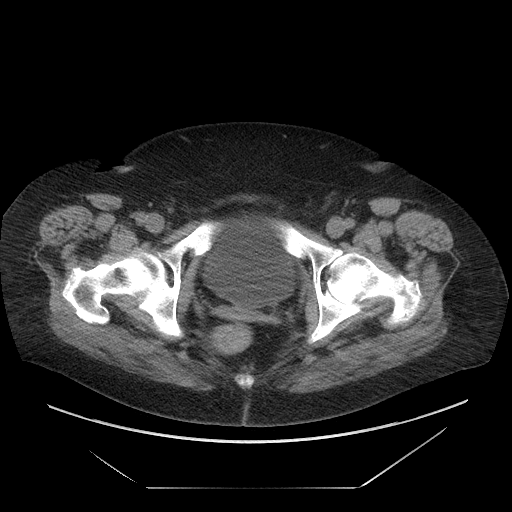
[im 11/99  bone]
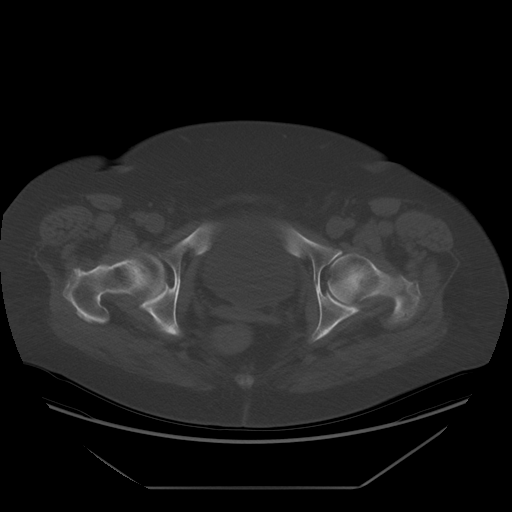
[im 22/99  soft-tissue]
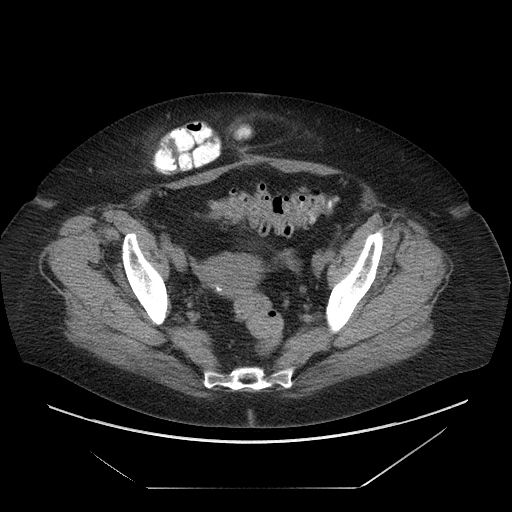
[im 33/99  soft-tissue]
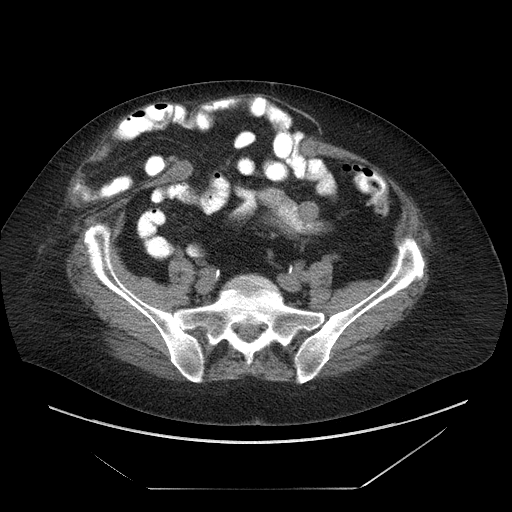
[im 44/99  soft-tissue]
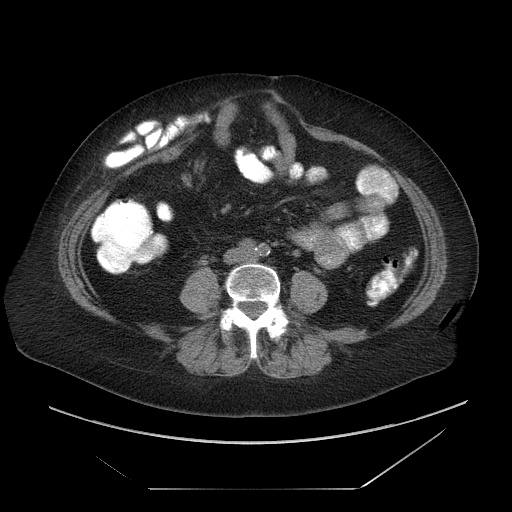
[im 55/99  soft-tissue]
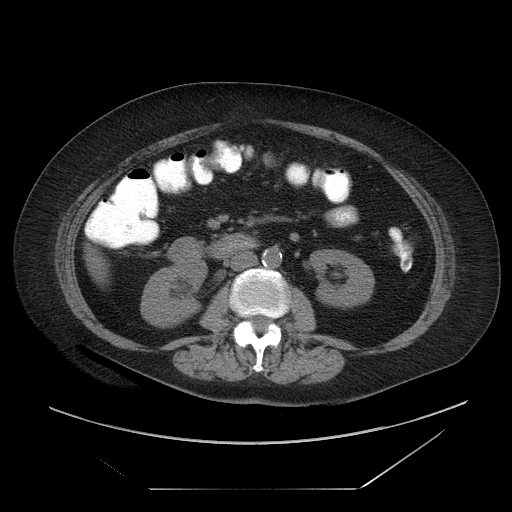
[im 55/99  lung]
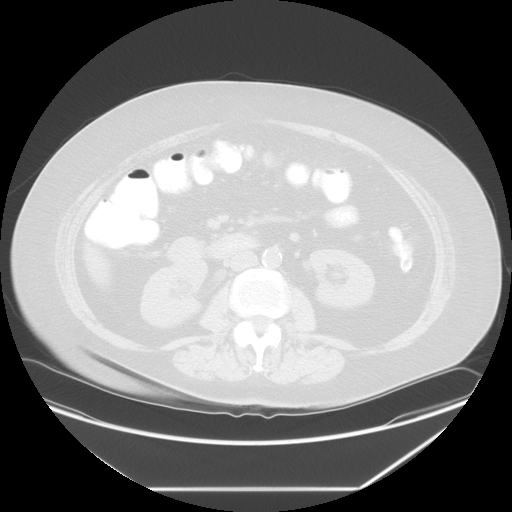
[im 66/99  soft-tissue]
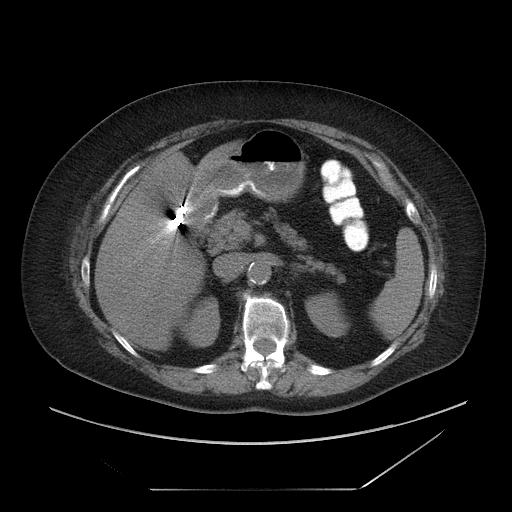
[im 66/99  lung]
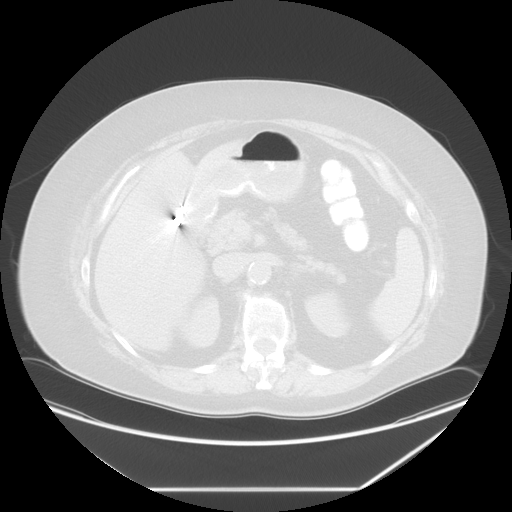
[im 77/99  soft-tissue]
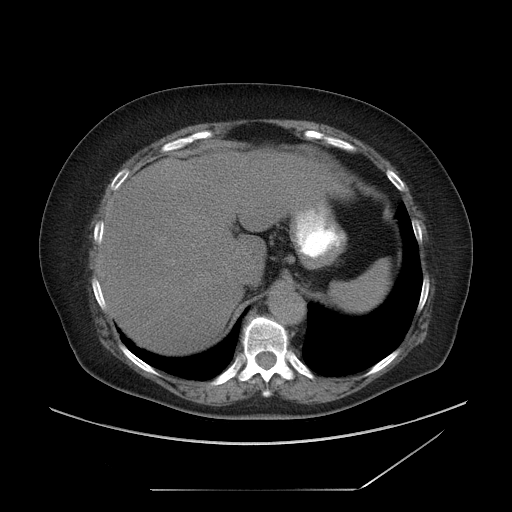
[im 77/99  lung]
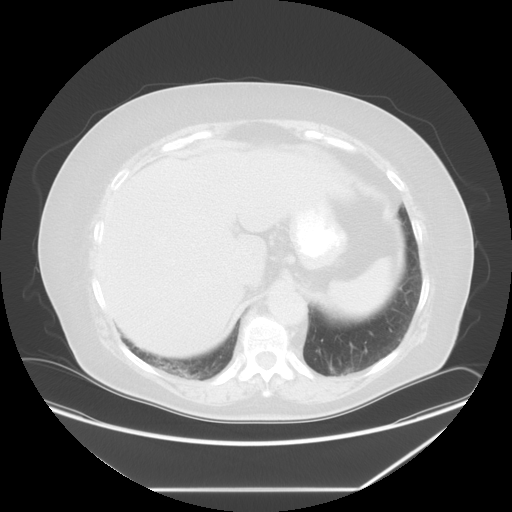
[im 88/99  soft-tissue]
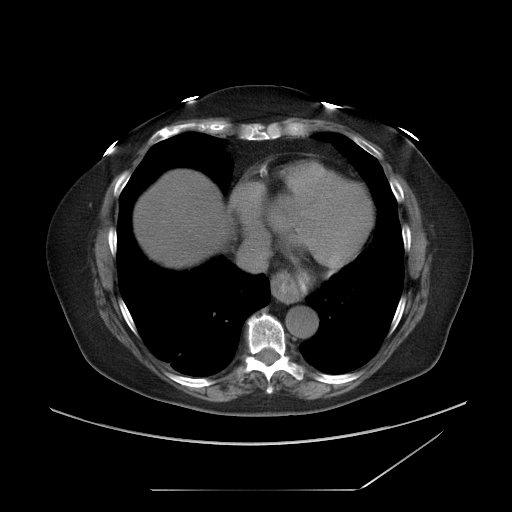
[im 88/99  lung]
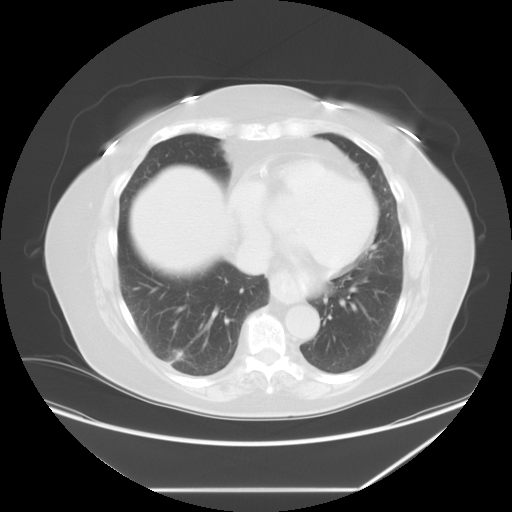

[Series 601: coronal body · coronal · 0.98mm/px · 1 of 127 slices shown, 2 images]
[im 43/127  soft-tissue]
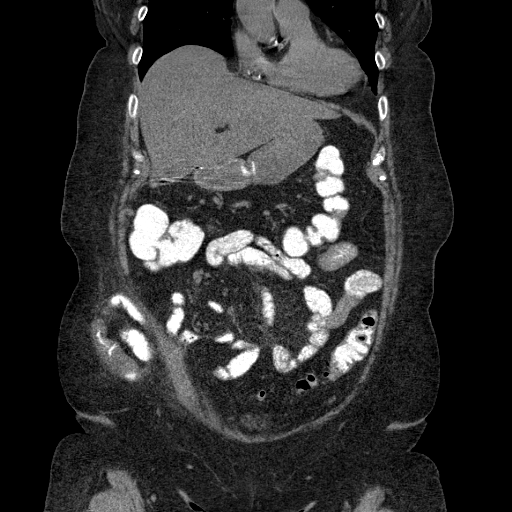
[im 43/127  bone]
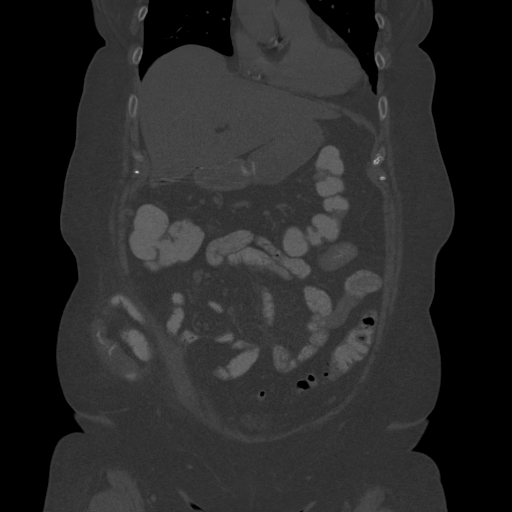

[Series 602: sagittal body · sagittal · 0.98mm/px · 4 of 177 slices shown]
[im 20/177  soft-tissue]
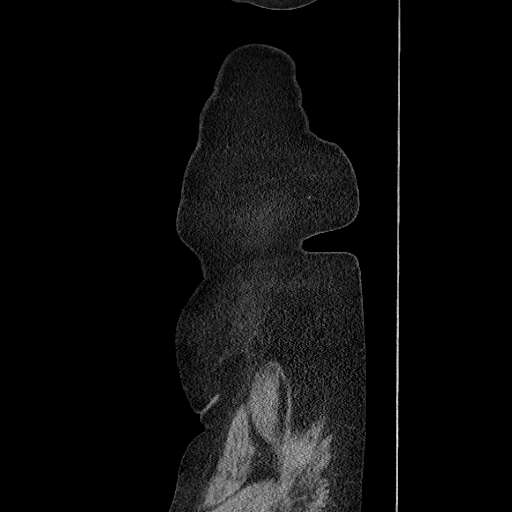
[im 40/177  soft-tissue]
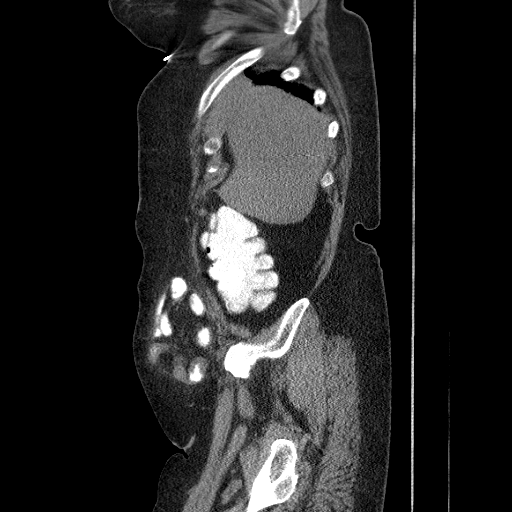
[im 59/177  soft-tissue]
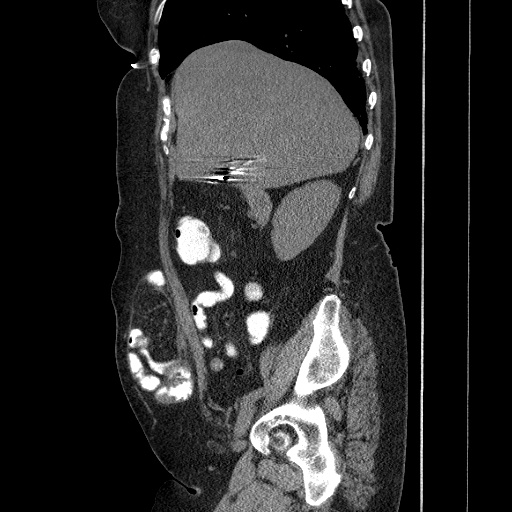
[im 79/177  soft-tissue]
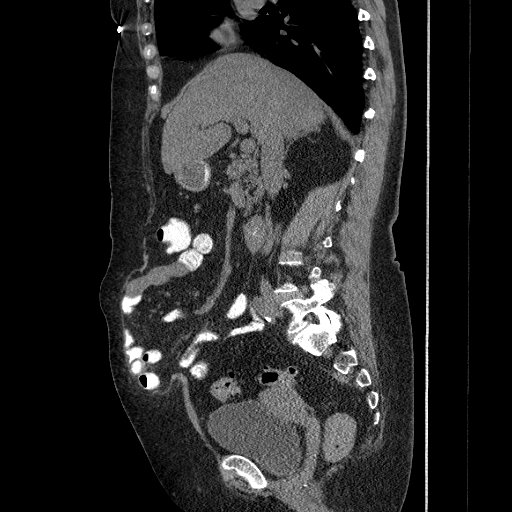

[13 of 36 positions shown; findings below may reference images not displayed]

FINDINGS: A moderate to large hiatal hernia is noted.

Mild fatty infiltration of the liver is noted.
No focal hepatic abnormalities are noted.
The spleen, adrenal glands, pancreas and kidneys are unremarkable.
Please note that parenchymal abnormalities may be missed as
intravenous contrast was not administered.
The patient is status post cholecystectomy.

A large wide mouthed abdominal/upper pelvic ventral hernia is noted
containing multiple small bowel loops.  There is no evidence of
bowel obstruction, small bowel wall thickening or pneumoperitoneum.

Descending and sigmoid colonic diverticulosis is noted without
evidence of diverticulitis.

No free fluid, enlarged lymph nodes, biliary dilation or abdominal
aortic aneurysm identified.

The bladder is unremarkable.
No acute or suspicious bony abnormalities are identified.
IMPRESSION: Large wide mouthed abdominal/upper pelvic ventral hernia containing
multiple small bowel loops - no evidence of bowel obstruction or
bowel wall thickening.

Moderate to large hiatal hernia.

Mild fatty infiltration of the liver.

## 2015-05-28 IMAGING — CR DG CHEST 2V
2 series · 2 of 2 positions shown · non-contrast
Comparison: None.

CLINICAL DATA: Cough.  Incisional hernia.  Preop respiratory exam.

CHEST - 2 VIEW

[w chest pa]
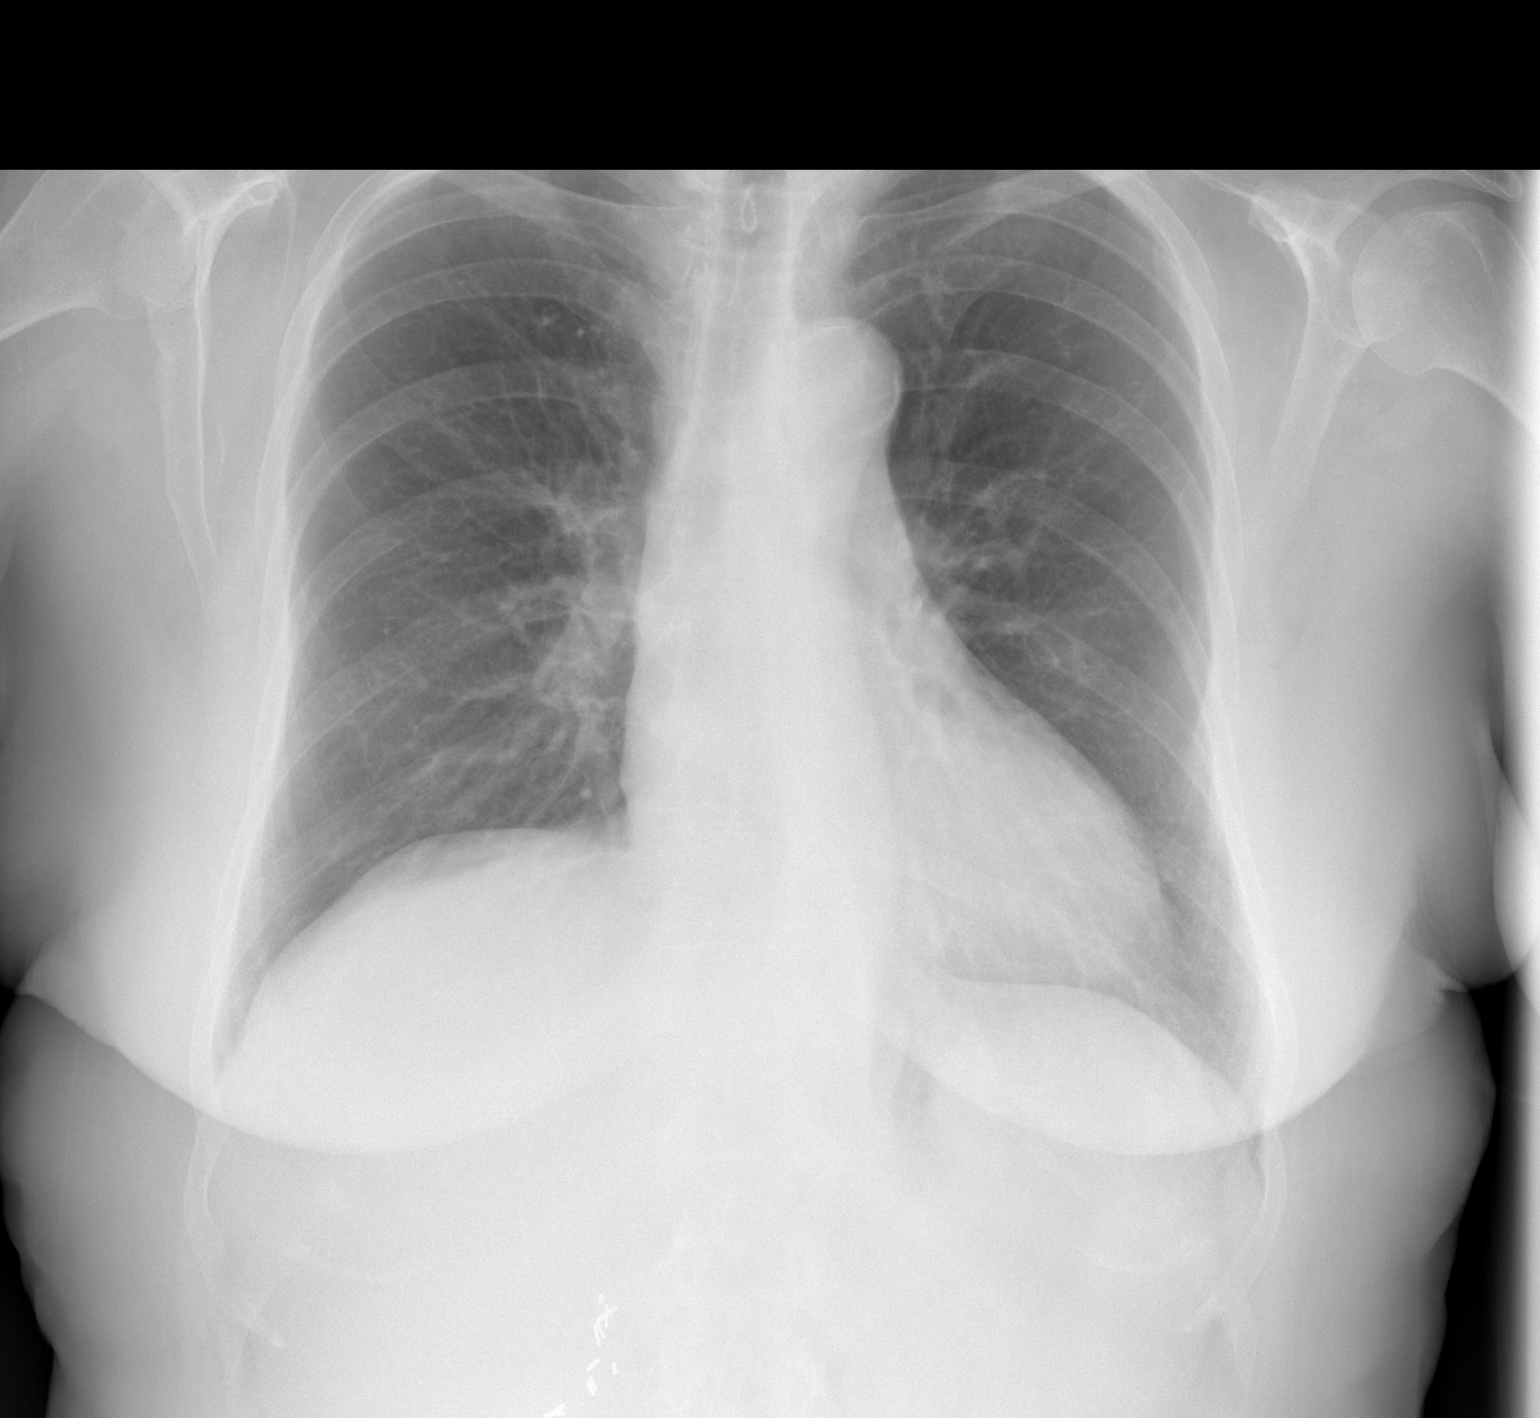

[w chest lat]
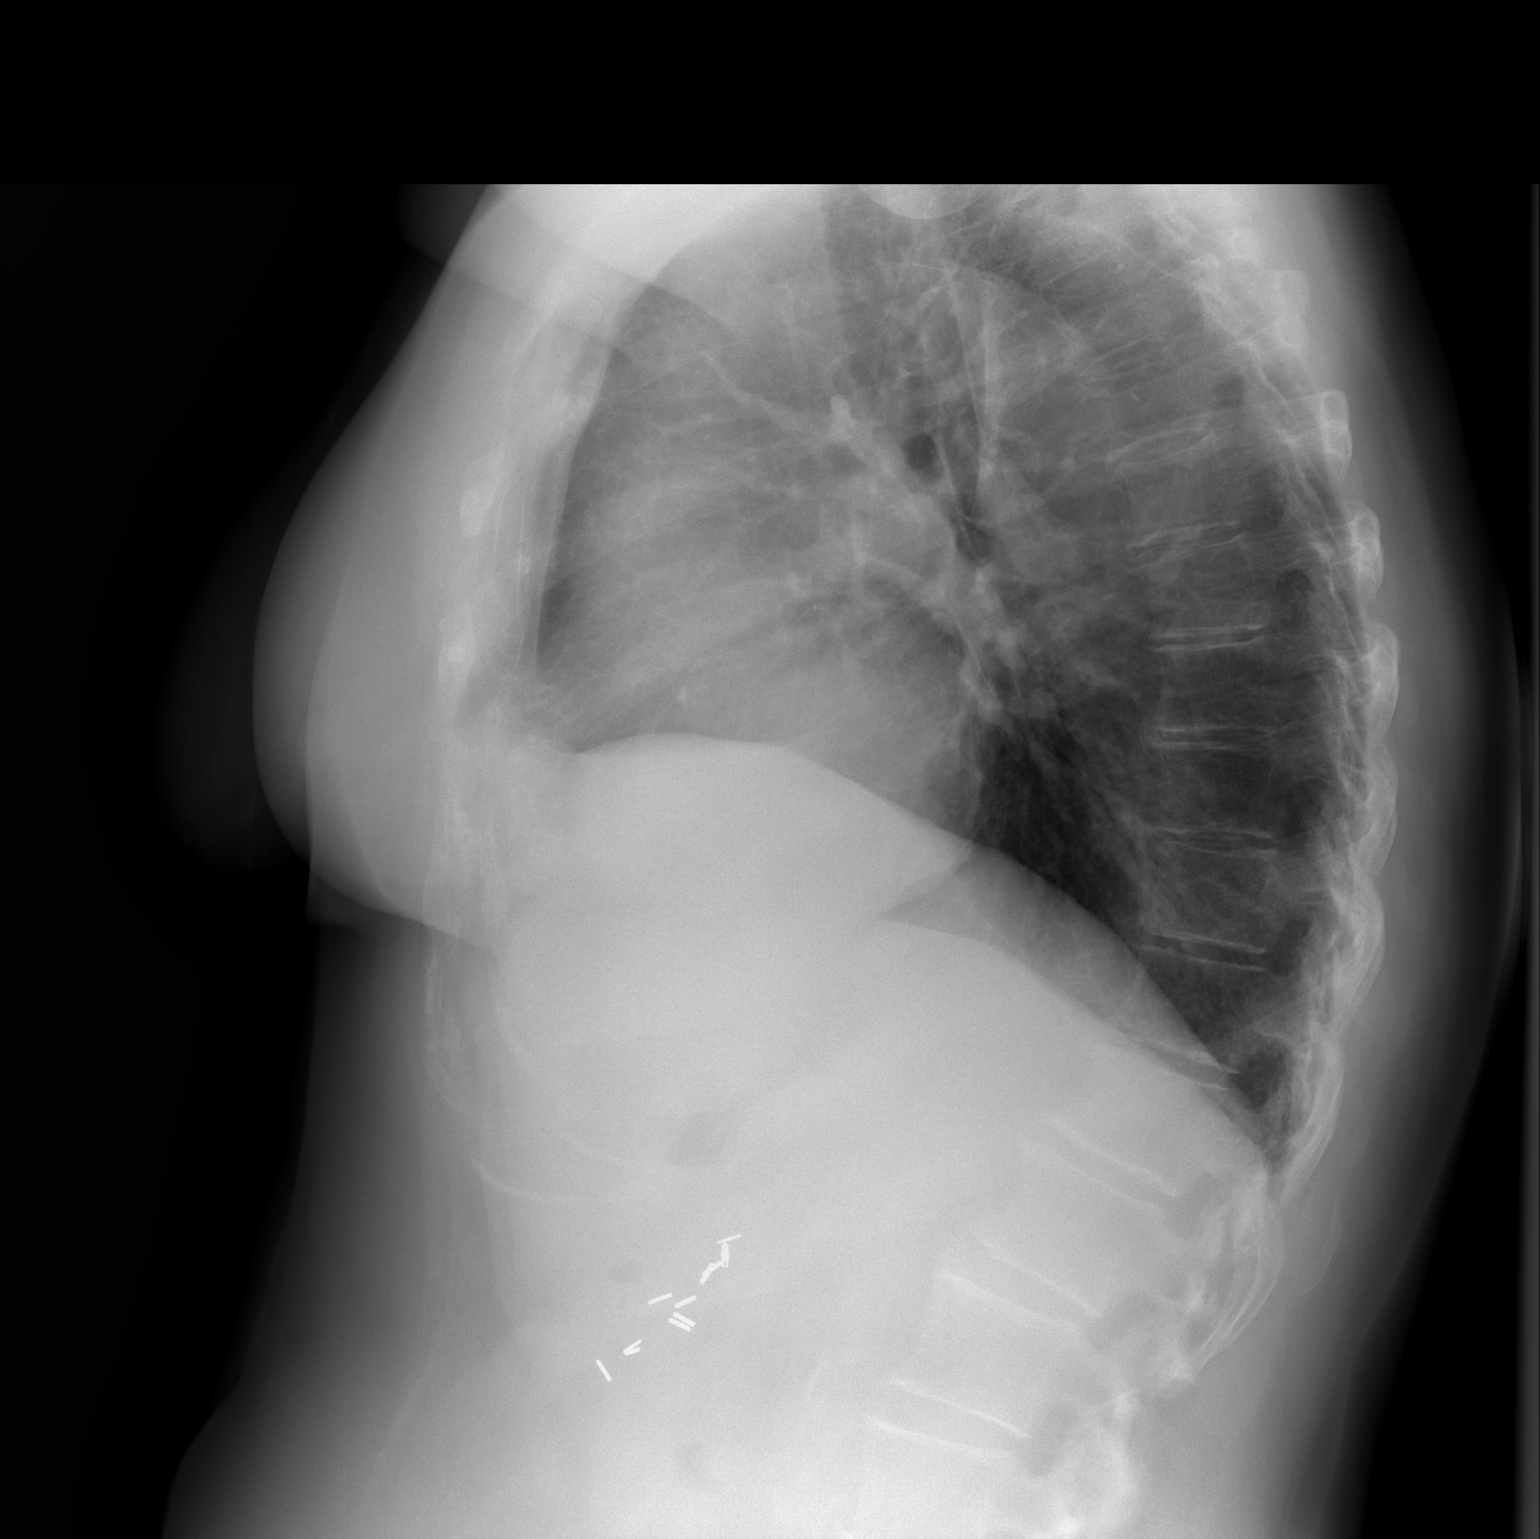

[2 of 2 positions shown; findings below may reference images not displayed]

FINDINGS: The heart size and mediastinal contours are within
normal limits.  Both lungs are clear.  The visualized skeletal
structures are unremarkable.
IMPRESSION: No active cardiopulmonary disease.

## 2016-04-05 DIAGNOSIS — E781 Pure hyperglyceridemia: Secondary | ICD-10-CM | POA: Diagnosis present

## 2016-04-05 DIAGNOSIS — K2100 Gastro-esophageal reflux disease with esophagitis, without bleeding: Secondary | ICD-10-CM | POA: Diagnosis present

## 2016-04-05 DIAGNOSIS — I10 Essential (primary) hypertension: Secondary | ICD-10-CM | POA: Diagnosis present

## 2016-12-16 ENCOUNTER — Emergency Department (HOSPITAL_BASED_OUTPATIENT_CLINIC_OR_DEPARTMENT_OTHER)
Admission: EM | Admit: 2016-12-16 | Discharge: 2016-12-16 | Disposition: A | Discharge status: Home / Self Care | Payer: Medicare Other | Attending: Emergency Medicine | Admitting: Emergency Medicine

## 2016-12-16 ENCOUNTER — Emergency Department (HOSPITAL_BASED_OUTPATIENT_CLINIC_OR_DEPARTMENT_OTHER): Payer: Medicare Other

## 2016-12-16 ENCOUNTER — Encounter (HOSPITAL_BASED_OUTPATIENT_CLINIC_OR_DEPARTMENT_OTHER): Payer: Self-pay | Admitting: Emergency Medicine

## 2016-12-16 DIAGNOSIS — R079 Chest pain, unspecified: Secondary | ICD-10-CM | POA: Diagnosis present

## 2016-12-16 DIAGNOSIS — I10 Essential (primary) hypertension: Secondary | ICD-10-CM | POA: Insufficient documentation

## 2016-12-16 DIAGNOSIS — Z79899 Other long term (current) drug therapy: Secondary | ICD-10-CM | POA: Insufficient documentation

## 2016-12-16 DIAGNOSIS — J189 Pneumonia, unspecified organism: Secondary | ICD-10-CM | POA: Diagnosis not present

## 2016-12-16 DIAGNOSIS — Z7982 Long term (current) use of aspirin: Secondary | ICD-10-CM | POA: Insufficient documentation

## 2016-12-16 DIAGNOSIS — R112 Nausea with vomiting, unspecified: Secondary | ICD-10-CM | POA: Diagnosis not present

## 2016-12-16 LAB — BASIC METABOLIC PANEL
Anion gap: 10 (ref 5–15)
BUN: 12 mg/dL (ref 6–20)
CALCIUM: 9.3 mg/dL (ref 8.9–10.3)
CO2: 24 mmol/L (ref 22–32)
CREATININE: 0.65 mg/dL (ref 0.44–1.00)
Chloride: 106 mmol/L (ref 101–111)
GFR calc non Af Amer: >60 mL/min (ref >60)
GLUCOSE: 131 mg/dL — AB (ref 65–99)
Potassium: 3.1 mmol/L — ABNORMAL LOW (ref 3.5–5.1)
Sodium: 140 mmol/L (ref 135–145)

## 2016-12-16 LAB — CBC
HCT: 35.3 % — ABNORMAL LOW (ref 36.0–46.0)
Hemoglobin: 11.8 g/dL — ABNORMAL LOW (ref 12.0–15.0)
MCH: 28.5 pg (ref 26.0–34.0)
MCHC: 33.4 g/dL (ref 30.0–36.0)
MCV: 85.3 fL (ref 78.0–100.0)
Platelets: 332 10*3/uL (ref 150–400)
RBC: 4.14 MIL/uL (ref 3.87–5.11)
RDW: 14.6 % (ref 11.5–15.5)
WBC: 9.6 10*3/uL (ref 4.0–10.5)

## 2016-12-16 LAB — TROPONIN I: Troponin I: <0.03 ng/mL (ref <0.03)

## 2016-12-16 MED ORDER — AZITHROMYCIN 250 MG PO TABS: ORAL_TABLET | ORAL | 0 refills | Status: DC

## 2016-12-16 MED ORDER — IPRATROPIUM-ALBUTEROL 0.5-2.5 (3) MG/3ML IN SOLN
3.0000 mL | Freq: Four times a day (QID) | RESPIRATORY_TRACT | Status: DC
  Administered 2016-12-16: 3 mL via RESPIRATORY_TRACT
  Filled 2016-12-16: qty 3

## 2016-12-16 MED ORDER — ALBUTEROL SULFATE HFA 108 (90 BASE) MCG/ACT IN AERS
2.0000 | INHALATION_SPRAY | RESPIRATORY_TRACT | Status: DC | PRN
  Administered 2016-12-16: 2 via RESPIRATORY_TRACT
  Filled 2016-12-16: qty 6.7

## 2016-12-16 MED ORDER — GUAIFENESIN-CODEINE 100-10 MG/5ML PO SOLN: 10.0000 mL | Freq: Four times a day (QID) | ORAL | 0 refills | Status: DC | PRN

## 2016-12-16 NOTE — ED Notes (Signed)
Pt given d/c instructions as per chart. Rx x 2. Verbalizes understanding. No questions. 

## 2016-12-16 NOTE — ED Triage Notes (Signed)
Pt reports heaviness in chest since about 1300 today, along with sudden nausea and vomiting.  Pt states symptoms persist and pt also feels lightheadedness off and on since 1300.  Pt actively vomiting clear emesis in triage.

## 2016-12-16 NOTE — ED Provider Notes (Signed)
[facility] Provider Note   CSN: [id number] Arrival date & time: 12/16/16  1829  By signing my name below, I, Dora Sims, attest that this documentation has been prepared under the direction and in the presence of physician practitioner, Veryl Speak, MD. Electronically Signed: Dora Sims, Scribe. 12/16/2016. 8:17 PM.  History   Chief Complaint Chief Complaint  Patient presents with  . Chest Pain    The history is provided by the patient. No language interpreter was used.     HPI Comments: Amanda Potter is a 71 y.o. female with PMHx including HTN, HLD, and GERD who presents to the Emergency Department complaining of intermittent chest pain beginning around 1 PM this afternoon. She states she developed a cough productive of clear sputum earlier today and her chest pain is only present when she coughs. She notes associated subjective fevers/chills and some occasional nausea/vomiting. No known sick contacts. No h/o asthma, emphysema, or COPD. Pt is a non-smoker. She denies abdominal pain, lower extremity pain/swelling, or any other associated symptoms.  Past Medical History:  Diagnosis Date  . Deviated nasal septum   . Environmental allergies   . GERD (gastroesophageal reflux disease)   . Hyperlipidemia   . Hypertension   . Perforation of colon ([place]) 2006   Following colonoscopy  . Sinusitis, acute     Patient Active Problem List   Diagnosis Date Noted  . Incisional hernia, without obstruction or gangrene s/p laparoscopic repair 03/03/13 01/29/2013    Past Surgical History:  Procedure Laterality Date  . CHOLECYSTECTOMY    . COLON SURGERY Right 2006   hemicolectomy  . HERNIA REPAIR  [id number]  . INCISIONAL HERNIA REPAIR N/A 03/03/2013   Procedure: LAPAROSCOPIC INCISIONAL HERNIA REPAIR;  Surgeon: Odis Hollingshead, MD;  Location: [facility] ORS;  Service: General;  Laterality: N/A;  LYSIS OF ADHESIONS FOR 60 MINUTES  . INSERTION OF MESH N/A 03/03/2013   Procedure:  INSERTION OF MESH;  Surgeon: Odis Hollingshead, MD;  Location: [facility] ORS;  Service: General;  Laterality: N/A;  . TONSILLECTOMY    . TUBAL LIGATION      OB History    No data available       Home Medications    Prior to Admission medications   Medication Sig Start Date End Date Taking? Authorizing Provider  amLODipine (NORVASC) 10 MG tablet Take 10 mg by mouth at bedtime.   Yes Historical Provider, MD  aspirin 81 MG tablet Take 81 mg by mouth daily.   Yes Historical Provider, MD  ramipril (ALTACE) 10 MG capsule Take 10 mg by mouth daily.   Yes Historical Provider, MD  simvastatin (ZOCOR) 20 MG tablet Take 20 mg by mouth at bedtime.    Yes Historical Provider, MD  benzonatate (TESSALON) 100 MG capsule Take 100 mg by mouth 3 (three) times daily as needed for cough.    Historical Provider, MD  Carboxymethylcellul-Glycerin (OPTIVE) 0.5-0.9 % SOLN Apply 1 drop to eye daily as needed (for dry eyes).    Historical Provider, MD  naproxen sodium (ANAPROX) 220 MG tablet Take 220 mg by mouth 2 (two) times daily as needed (for pain).    Historical Provider, MD  oxyCODONE-acetaminophen (PERCOCET/ROXICET) 5-325 MG per tablet Take 1-2 tablets by mouth every 4 (four) hours as needed. 03/07/13   Jackolyn Confer, MD    Family History Family History  Problem Relation Age of Onset  . Hearing loss Mother   . Diabetes Mother   . Hyperlipidemia Mother   .  Hypertension Mother   . Heart disease Father     Social History Social History  Substance Use Topics  . Smoking status: Never Smoker  . Smokeless tobacco: Never Used  . Alcohol use No     Allergies   Tape   Review of Systems Review of Systems  Constitutional: Positive for chills and fever.  Respiratory: Positive for cough.   Cardiovascular: Positive for chest pain. Negative for leg swelling.  Gastrointestinal: Positive for nausea and vomiting. Negative for abdominal pain.  Musculoskeletal: Negative for myalgias.  All other systems  reviewed and are negative.  Physical Exam Updated Vital Signs BP 137/77 (BP Location: Left Arm)   Pulse (!) 109   Temp 99.2 F (37.3 C) (Oral)   Resp (!) 28   Ht 5' 6.5" (1.689 m)   Wt 190 lb (86.2 kg)   SpO2 100%   BMI 30.21 kg/m   Physical Exam  Constitutional: She is oriented to person, place, and time. She appears well-developed and well-nourished. No distress.  HENT:  Head: Normocephalic and atraumatic.  Eyes: EOM are normal.  Neck: Normal range of motion.  Cardiovascular: Normal rate, regular rhythm and normal heart sounds.   Pulmonary/Chest: Effort normal and breath sounds normal.  Abdominal: Soft. She exhibits no distension. There is no tenderness.  Musculoskeletal: Normal range of motion.  Neurological: She is alert and oriented to person, place, and time.  Skin: Skin is warm and dry.  Psychiatric: She has a normal mood and affect. Judgment normal.  Nursing note and vitals reviewed.  ED Treatments / Results  Labs (all labs ordered are listed, but only abnormal results are displayed) Labs Reviewed  CBC  BASIC METABOLIC PANEL  TROPONIN I    EKG  EKG Interpretation  Date/Time:  Saturday December 16 2016 20:01:06 EDT Ventricular Rate:  102 PR Interval:  162 QRS Duration: 86 QT Interval:  364 QTC Calculation: 474 R Axis:   24 Text Interpretation:  Sinus tachycardia Otherwise normal ECG Confirmed by Sten Dematteo  MD, Shravya Wickwire ([id number]) on 12/16/2016 8:14:45 PM       Radiology No results found.  Procedures Procedures (including critical care time)  DIAGNOSTIC STUDIES: Oxygen Saturation is 100% on RA, normal by my interpretation.    COORDINATION OF CARE: 8:21 PM Discussed treatment plan with pt at bedside and pt agreed to plan.  Medications Ordered in ED Medications - No data to display   Initial Impression / Assessment and Plan / ED Course  I have reviewed the triage vital signs and the nursing notes.  Pertinent labs & imaging results that were available  during my care of the patient were reviewed by me and considered in my medical decision making (see chart for details).  Patient presents here with cough and difficulty breathing. She is also describing burning in her chest when she coughs. Her EKG is unchanged and troponin is negative. I highly doubt a cardiac etiology. She is feeling better after a breathing treatment. Workup is essentially unremarkable with the exception of the possibility of a subtle opacity on the chest x-ray consistent with developing pneumonia. She will be treated with Zithromax, Robitussin with codeine, and given an albuterol inhaler which she can use as needed for difficulty breathing.  Final Clinical Impressions(s) / ED Diagnoses   Final diagnoses:  None    New Prescriptions New Prescriptions   No medications on file   I personally performed the services described in this documentation, which was scribed in my presence. The recorded information  has been reviewed and is accurate.       Veryl Speak, MD 12/16/16 2129

## 2016-12-16 NOTE — ED Notes (Addendum)
Dr. Stark Jock into room. Alert, NAD, calm, interactive, resps e/u, voice hoarse, speaking in clearly, skin W&D, VSS.  c/o allergies, cough, "coughing leads to sob while coughing", also mentions episodes of vomiting, feeling hot, and chest pressure, describes sx as "comes and goes", (denies at this time: pain, sob, nausea, dizziness or visual changes). Family at [facility].

## 2016-12-16 NOTE — Discharge Instructions (Signed)
Zithromax as prescribed.  Robitussin with codeine as prescribed as needed for cough.  Albuterol inhaler: 2 puffs every 4 hours as needed for wheezing or difficulty breathing.  Return to the emergency department if your symptoms significantly worsen or change.

## 2017-12-10 ENCOUNTER — Other Ambulatory Visit: Payer: Self-pay | Admitting: Family Medicine

## 2017-12-10 ENCOUNTER — Ambulatory Visit
Admission: RE | Admit: 2017-12-10 | Discharge: 2017-12-10 | Disposition: A | Discharge status: Home / Self Care | Payer: Medicare Other | Source: Ambulatory Visit | Attending: Family Medicine | Admitting: Family Medicine

## 2017-12-10 DIAGNOSIS — R05 Cough: Secondary | ICD-10-CM

## 2017-12-10 DIAGNOSIS — R059 Cough, unspecified: Secondary | ICD-10-CM

## 2017-12-10 DIAGNOSIS — R0689 Other abnormalities of breathing: Secondary | ICD-10-CM

## 2019-05-12 ENCOUNTER — Other Ambulatory Visit: Payer: Self-pay | Admitting: Gastroenterology

## 2019-05-27 ENCOUNTER — Other Ambulatory Visit (HOSPITAL_COMMUNITY)
Admission: RE | Admit: 2019-05-27 | Discharge: 2019-05-27 | Disposition: A | Discharge status: Home / Self Care | Payer: Medicare Other | Source: Ambulatory Visit | Attending: Gastroenterology | Admitting: Gastroenterology

## 2019-05-27 DIAGNOSIS — Z20828 Contact with and (suspected) exposure to other viral communicable diseases: Secondary | ICD-10-CM | POA: Insufficient documentation

## 2019-05-27 DIAGNOSIS — Z01812 Encounter for preprocedural laboratory examination: Secondary | ICD-10-CM | POA: Insufficient documentation

## 2019-05-27 LAB — SARS CORONAVIRUS 2 (TAT 6-24 HRS): SARS Coronavirus 2: NEGATIVE

## 2019-05-28 ENCOUNTER — Encounter (HOSPITAL_COMMUNITY): Payer: Self-pay | Admitting: *Deleted

## 2019-05-28 ENCOUNTER — Other Ambulatory Visit: Payer: Self-pay

## 2019-05-30 ENCOUNTER — Encounter (HOSPITAL_COMMUNITY)
Admission: RE | Disposition: A | Discharge status: Home / Self Care | Payer: Self-pay | Source: Home / Self Care | Attending: Gastroenterology

## 2019-05-30 ENCOUNTER — Ambulatory Visit (HOSPITAL_COMMUNITY): Payer: Medicare Other | Admitting: Certified Registered"

## 2019-05-30 ENCOUNTER — Ambulatory Visit (HOSPITAL_COMMUNITY)
Admission: RE | Admit: 2019-05-30 | Discharge: 2019-05-30 | Disposition: A | Discharge status: Home / Self Care | Payer: Medicare Other | Attending: Gastroenterology | Admitting: Gastroenterology

## 2019-05-30 ENCOUNTER — Encounter (HOSPITAL_COMMUNITY): Payer: Self-pay | Admitting: Emergency Medicine

## 2019-05-30 ENCOUNTER — Other Ambulatory Visit: Payer: Self-pay

## 2019-05-30 DIAGNOSIS — Z7982 Long term (current) use of aspirin: Secondary | ICD-10-CM | POA: Insufficient documentation

## 2019-05-30 DIAGNOSIS — K219 Gastro-esophageal reflux disease without esophagitis: Secondary | ICD-10-CM | POA: Diagnosis not present

## 2019-05-30 DIAGNOSIS — D122 Benign neoplasm of ascending colon: Secondary | ICD-10-CM | POA: Insufficient documentation

## 2019-05-30 DIAGNOSIS — Z980 Intestinal bypass and anastomosis status: Secondary | ICD-10-CM | POA: Diagnosis not present

## 2019-05-30 DIAGNOSIS — K449 Diaphragmatic hernia without obstruction or gangrene: Secondary | ICD-10-CM | POA: Insufficient documentation

## 2019-05-30 DIAGNOSIS — E785 Hyperlipidemia, unspecified: Secondary | ICD-10-CM | POA: Diagnosis not present

## 2019-05-30 DIAGNOSIS — K222 Esophageal obstruction: Secondary | ICD-10-CM | POA: Insufficient documentation

## 2019-05-30 DIAGNOSIS — I10 Essential (primary) hypertension: Secondary | ICD-10-CM | POA: Insufficient documentation

## 2019-05-30 DIAGNOSIS — Z7951 Long term (current) use of inhaled steroids: Secondary | ICD-10-CM | POA: Diagnosis not present

## 2019-05-30 DIAGNOSIS — Z79899 Other long term (current) drug therapy: Secondary | ICD-10-CM | POA: Insufficient documentation

## 2019-05-30 DIAGNOSIS — D509 Iron deficiency anemia, unspecified: Secondary | ICD-10-CM | POA: Insufficient documentation

## 2019-05-30 DIAGNOSIS — K5730 Diverticulosis of large intestine without perforation or abscess without bleeding: Secondary | ICD-10-CM | POA: Diagnosis not present

## 2019-05-30 HISTORY — PX: ENTEROSCOPY: SHX5533

## 2019-05-30 HISTORY — PX: COLONOSCOPY WITH PROPOFOL: SHX5780

## 2019-05-30 HISTORY — PX: POLYPECTOMY: SHX5525

## 2019-05-30 SURGERY — ENTEROSCOPY
Anesthesia: Monitor Anesthesia Care

## 2019-05-30 MED ORDER — PROPOFOL 10 MG/ML IV BOLUS
INTRAVENOUS | Status: DC | PRN
  Administered 2019-05-30 (×8): 40 mg via INTRAVENOUS

## 2019-05-30 MED ORDER — LIDOCAINE 2% (20 MG/ML) 5 ML SYRINGE
INTRAMUSCULAR | Status: DC | PRN
  Administered 2019-05-30: 20 mg via INTRAVENOUS
  Administered 2019-05-30 (×2): 40 mg via INTRAVENOUS

## 2019-05-30 MED ORDER — LACTATED RINGERS IV SOLN
INTRAVENOUS | Status: DC
  Administered 2019-05-30: 08:00:00 via INTRAVENOUS

## 2019-05-30 MED ORDER — PROPOFOL 10 MG/ML IV BOLUS
INTRAVENOUS | Status: AC
  Filled 2019-05-30: qty 40

## 2019-05-30 MED ORDER — ONDANSETRON HCL 4 MG/2ML IJ SOLN
INTRAMUSCULAR | Status: DC | PRN
  Administered 2019-05-30: 4 mg via INTRAVENOUS

## 2019-05-30 MED ORDER — PROPOFOL 10 MG/ML IV BOLUS
INTRAVENOUS | Status: AC
  Filled 2019-05-30: qty 20

## 2019-05-30 MED ORDER — DEXAMETHASONE SODIUM PHOSPHATE 10 MG/ML IJ SOLN
INTRAMUSCULAR | Status: DC | PRN
  Administered 2019-05-30: 4 mg via INTRAVENOUS

## 2019-05-30 MED ORDER — SODIUM CHLORIDE 0.9 % IV SOLN: INTRAVENOUS | Status: DC

## 2019-05-30 SURGICAL SUPPLY — 22 items

## 2019-05-30 NOTE — Op Note (Signed)
[facility] Patient Name: Amanda Potter Procedure Date: 05/30/2019 MRN: [id number] Attending MD: Carol Ada , MD Date of Birth: June 11, 1946 CSN: [id number] Age: 73 Admit Type: Outpatient Procedure:                Small bowel enteroscopy Indications:              Iron deficiency anemia Providers:                Carol Ada, MD, Raynelle Bring, RN, Elspeth Cho                            Tech., Technician, Glenis Smoker, CRNA Referring MD:              Medicines:                Propofol per Anesthesia Complications:            No immediate complications. Estimated Blood Loss:     Estimated blood loss: none. Procedure:                Pre-Anesthesia Assessment:                           - Prior to the procedure, a History and Physical                            was performed, and patient medications and                            allergies were reviewed. The patient's tolerance of                            previous anesthesia was also reviewed. The risks                            and benefits of the procedure and the sedation                            options and risks were discussed with the patient.                            All questions were answered, and informed consent                            was obtained. Prior Anticoagulants: The patient has                            taken no previous anticoagulant or antiplatelet                            agents. ASA Grade Assessment: III - A patient with                            severe systemic disease. After reviewing the risks  and benefits, the patient was deemed in                            satisfactory condition to undergo the procedure.                           - Sedation was administered by an anesthesia                            professional. Deep sedation was attained.                           After obtaining informed consent, the endoscope was   passed under direct vision. Throughout the                            procedure, the patient's blood pressure, pulse, and                            oxygen saturations were monitored continuously. The                            [id number] Olympus pediatric colonscope                            was introduced through the mouth and advanced to                            the small bowel distal to the Ligament of Treitz.                            The small bowel enteroscopy was accomplished                            without difficulty. The patient tolerated the                            procedure well. Scope In: Scope Out: Findings:      A non-obstructing and mild Schatzki ring was found at the       gastroesophageal junction.      A 5 cm hiatal hernia was present.      The stomach was normal.      The examined duodenum was normal.      There was no evidence of significant pathology in the entire examined       portion of jejunum. Impression:               - Non-obstructing and mild Schatzki ring.                           - 5 cm hiatal hernia.                           - Normal stomach.                           -  Normal examined duodenum.                           - The examined portion of the jejunum was normal.                           - No specimens collected. Recommendation:           - Patient has a contact number available for                            emergencies. The signs and symptoms of potential                            delayed complications were discussed with the                            patient. Return to normal activities tomorrow.                            Written discharge instructions were provided to the                            patient.                           - Resume previous diet.                           - Schedule VCE. Procedure Code(s):        --- Professional ---                           [phone], Small intestinal endoscopy,  enteroscopy                            beyond second portion of duodenum, not including                            ileum; diagnostic, including collection of                            specimen(s) by brushing or washing, when performed                            (separate procedure) Diagnosis Code(s):        --- Professional ---                           D50.9, Iron deficiency anemia, unspecified                           K22.2, Esophageal obstruction                           K44.9, Diaphragmatic hernia without obstruction or  gangrene CPT copyright 2019 [employer]. All rights reserved. The codes documented in this report are preliminary and upon coder review may  be revised to meet current compliance requirements. Carol Ada, MD Carol Ada, MD 05/30/2019 10:23:34 AM This report has been signed electronically. Number of Addenda: 0

## 2019-05-30 NOTE — Discharge Instructions (Signed)

## 2019-05-30 NOTE — Op Note (Addendum)
[facility] Patient Name: Amanda Potter Procedure Date: 05/30/2019 MRN: [id number] Attending MD: Carol Ada , MD Date of Birth: 16-Apr-1946 CSN: [id number] Age: 73 Admit Type: Outpatient Procedure:                Colonoscopy Indications:              Iron deficiency anemia Providers:                Carol Ada, MD, Raynelle Bring, RN, Elspeth Cho                            Tech., Technician, Glenis Smoker, CRNA Referring MD:              Medicines:                Propofol per Anesthesia Complications:            No immediate complications. Estimated Blood Loss:     Estimated blood loss: none. Procedure:                Pre-Anesthesia Assessment:                           - Prior to the procedure, a History and Physical                            was performed, and patient medications and                            allergies were reviewed. The patient's tolerance of                            previous anesthesia was also reviewed. The risks                            and benefits of the procedure and the sedation                            options and risks were discussed with the patient.                            All questions were answered, and informed consent                            was obtained. Prior Anticoagulants: The patient has                            taken no previous anticoagulant or antiplatelet                            agents. ASA Grade Assessment: III - A patient with                            severe systemic disease. After reviewing the risks  and benefits, the patient was deemed in                            satisfactory condition to undergo the procedure.                           - Deep sedation was attained.                           After obtaining informed consent, the colonoscope                            was passed under direct vision. Throughout the                            procedure, the patient's  blood pressure, pulse, and                            oxygen saturations were monitored continuously. The                            [id number] Olympus pediatric colonscope                            was introduced through the anus and advanced to the                            the ileocolonic anastomosis. The colonoscopy was                            performed without difficulty. The patient tolerated                            the procedure well. The quality of the bowel                            preparation was good. The terminal ileum was                            photographed. Scope In: 9:53:51 AM Scope Out: 10:17:19 AM Scope Withdrawal Time: 0 hours 16 minutes 47 seconds  Total Procedure Duration: 0 hours 23 minutes 28 seconds  Findings:      Two sessile polyps were found in the ascending colon. The polyps were 2       to 5 mm in size. These polyps were removed with a cold snare. Resection       and retrieval were complete.      There was evidence of a prior end-to-side ileo-colonic anastomosis in       the ascending colon. This was patent and was characterized by healthy       appearing mucosa.      Scattered small and large-mouthed diverticula were found in the sigmoid       colon.      With her history of the prior perforation, great care was taken with the       colonosope  insertion. There was no difficulty encountered and looping       did not occur. Abdominal pressure was not required. Impression:               - Two 2 to 5 mm polyps in the ascending colon,                            removed with a cold snare. Resected and retrieved.                           - Patent end-to-side ileo-colonic anastomosis,                            characterized by healthy appearing mucosa.                           - Diverticulosis in the sigmoid colon. Moderate Sedation:      Not Applicable - Patient had care per Anesthesia. Recommendation:           - Patient has a contact  number available for                            emergencies. The signs and symptoms of potential                            delayed complications were discussed with the                            patient. Return to normal activities tomorrow.                            Written discharge instructions were provided to the                            patient.                           - Resume previous diet.                           - Continue present medications.                           - Await pathology results.                           - Repeat colonoscopy is not recommended due to                            current age (8 years or older) for surveillance. Procedure Code(s):        --- Professional ---                           [id number], Colonoscopy, flexible; with removal of  tumor(s), polyp(s), or other lesion(s) by snare                            technique Diagnosis Code(s):        --- Professional ---                           K63.5, Polyp of colon                           Z98.0, Intestinal bypass and anastomosis status                           D50.9, Iron deficiency anemia, unspecified                           K57.30, Diverticulosis of large intestine without                            perforation or abscess without bleeding CPT copyright 2019 [employer]. All rights reserved. The codes documented in this report are preliminary and upon coder review may  be revised to meet current compliance requirements. Carol Ada, MD Carol Ada, MD 05/30/2019 10:27:08 AM This report has been signed electronically. Number of Addenda: 0

## 2019-05-30 NOTE — Anesthesia Postprocedure Evaluation (Signed)
Anesthesia Post Note  Patient: Amanda Potter  Procedure(s) Performed: ENTEROSCOPY (N/A ) COLONOSCOPY WITH PROPOFOL (N/A ) POLYPECTOMY     Patient location during evaluation: Endoscopy Anesthesia Type: MAC Level of consciousness: awake and alert Pain management: pain level controlled Vital Signs Assessment: post-procedure vital signs reviewed and stable Respiratory status: spontaneous breathing, nonlabored ventilation, respiratory function stable and patient connected to nasal cannula oxygen Cardiovascular status: stable and blood pressure returned to baseline Postop Assessment: no apparent nausea or vomiting Anesthetic complications: no    Last Vitals:  Vitals:   05/30/19 1030 05/30/19 1040  BP: (!) 132/99 139/71  Pulse: 91 87  Resp: 17 15  Temp:    SpO2: 96% 98%    Last Pain:  Vitals:   05/30/19 1040  TempSrc:   PainSc: 0-No pain                 Montez Hageman

## 2019-05-30 NOTE — Transfer of Care (Signed)
Immediate Anesthesia Transfer of Care Note  Patient: Amanda Potter  Procedure(s) Performed: ENTEROSCOPY (N/A ) COLONOSCOPY WITH PROPOFOL (N/A ) POLYPECTOMY  Patient Location: PACU and Endoscopy Unit  Anesthesia Type:MAC  Level of Consciousness: awake and alert   Airway & Oxygen Therapy: Patient Spontanous Breathing and Patient connected to nasal cannula oxygen  Post-op Assessment: Report given to RN and Post -op Vital signs reviewed and stable  Post vital signs: Reviewed and stable  Last Vitals:  Vitals Value Taken Time  BP    Temp    Pulse 87 05/30/19 1026  Resp 17 05/30/19 1026  SpO2 98 % 05/30/19 1026  Vitals shown include unvalidated device data.  Last Pain:  Vitals:   05/30/19 0810  TempSrc: Oral         Complications: No apparent anesthesia complications

## 2019-05-30 NOTE — Anesthesia Procedure Notes (Signed)
Procedure Name: MAC Date/Time: 05/30/2019 9:40 AM Performed by: Cynda Familia, CRNA Pre-anesthesia Checklist: Patient identified, Emergency Drugs available, Suction available, Patient being monitored and Timeout performed Oxygen Delivery Method: Nasal cannula Airway Equipment and Method: Bite block (by gi tech) Placement Confirmation: positive ETCO2 and breath sounds checked- equal and bilateral Dental Injury: Teeth and Oropharynx as per pre-operative assessment

## 2019-05-30 NOTE — Anesthesia Preprocedure Evaluation (Addendum)
Anesthesia Evaluation  Patient identified by MRN, date of birth, ID band Patient awake    Reviewed: Allergy & Precautions, H&P , NPO status , Patient's Chart, lab work & pertinent test results  Airway Mallampati: II  TM Distance: >3 FB Neck ROM: Full    Dental  (+) Dental Advisory Given, Teeth Intact   Pulmonary neg pulmonary ROS,    Pulmonary exam normal breath sounds clear to auscultation       Cardiovascular hypertension, Pt. on medications Normal cardiovascular exam Rhythm:Regular Rate:Normal     Neuro/Psych negative neurological ROS  negative psych ROS   GI/Hepatic Neg liver ROS, GERD  Medicated,  Endo/Other  negative endocrine ROS  Renal/GU negative Renal ROS     Musculoskeletal negative musculoskeletal ROS (+)   Abdominal   Peds  Hematology negative hematology ROS (+)   Anesthesia Other Findings   Reproductive/Obstetrics negative OB ROS                             Anesthesia Physical  Anesthesia Plan  ASA: II  Anesthesia Plan: MAC   Post-op Pain Management:    Induction:   PONV Risk Score and Plan: 2 and Ondansetron, Dexamethasone and Treatment may vary due to age or medical condition  Airway Management Planned: Nasal Cannula and Simple Face Mask  Additional Equipment:   Intra-op Plan:   Post-operative Plan:   Informed Consent: I have reviewed the patients History and Physical, chart, labs and discussed the procedure including the risks, benefits and alternatives for the proposed anesthesia with the patient or authorized representative who has indicated his/her understanding and acceptance.     Dental advisory given  Plan Discussed with: CRNA  Anesthesia Plan Comments:        Anesthesia Quick Evaluation

## 2019-05-30 NOTE — H&P (Signed)
  Bhavika Bianconi HPI: The patient's HGB on 04/02/2019 was 9.3 g/dL with an MCV of 73. In Epic a value of 11.8 with an MCV of 85 was noted on 12/16/2016. The patient underwent a colonoscopy with Dr. Carlean Purl on 02/02/2005 for a family history of colon cancer. She was noted to have diverticula in the sigmoid colon and an 8 mm polyp. 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 A perforation occurred and she was rushed to [facility]. Dr. Zella Richer performed a right hemicolectomy for the perforation and in 2014 she underwent a ventral hernia repair. She currently denies any issues with hematochezia or melena.   Past Medical History:  Diagnosis Date  . Deviated nasal septum   . Environmental allergies   . GERD (gastroesophageal reflux disease)   . Hyperlipidemia   . Hypertension   . Perforation of colon ([place]) 2006   Following colonoscopy  . Sinusitis, acute     Past Surgical History:  Procedure Laterality Date  . CHOLECYSTECTOMY    . COLON SURGERY Right 2006   hemicolectomy  . HERNIA REPAIR  [id number]  . INCISIONAL HERNIA REPAIR N/A 03/03/2013   Procedure: LAPAROSCOPIC INCISIONAL HERNIA REPAIR;  Surgeon: Odis Hollingshead, MD;  Location: [facility] ORS;  Service: General;  Laterality: N/A;  LYSIS OF ADHESIONS FOR 60 MINUTES  . INSERTION OF MESH N/A 03/03/2013   Procedure: INSERTION OF MESH;  Surgeon: Odis Hollingshead, MD;  Location: [facility]S;  Service: General;  Laterality: N/A;  . TONSILLECTOMY    . TUBAL LIGATION      Family History  Problem Relation Age of Onset  . Hearing loss Mother   . Diabetes Mother   . Hyperlipidemia Mother   . Hypertension Mother   . Heart disease Father     Social History:  reports that she has never smoked. She has never used smokeless tobacco. She reports that she does not drink alcohol or use drugs.  Allergies:  Allergies  Allergen Reactions  . Tape Rash    Glue on tape    Medications: Scheduled: Continuous:  No results found for this or any previous visit (from the past 24  hour(s)).   No results found.  ROS:  As stated above in the HPI otherwise negative.  There were no vitals taken for this visit.    PE: Gen: NAD, Alert and Oriented HEENT:  [place]/AT, EOMI Neck: Supple, no LAD Lungs: CTA Bilaterally CV: RRR without M/G/R ABM: Soft, NTND, +BS Ext: No C/C/E  Assessment/Plan: 1) IDA.   There is a definite drop in her HGB. She will undergo an enteroscopy and colonoscopy for further evaluation. She is willing to undergo the procedures, but it is very anxiety provoking to undergo the colonoscopy. Care will be taken with inserting the colonoscope and to avoid excessive looping. I have discussed the risks of bleeding, infection, perforation, medication reactions, a 10% miss rate for a small colon cancer or polyp, and the risk of death. All questions were answered and the patient acknowledges these risks and wishes to proceed.    Megham Dwyer D 05/30/2019, 7:44 AM

## 2019-06-03 ENCOUNTER — Encounter (HOSPITAL_COMMUNITY): Payer: Self-pay | Admitting: Gastroenterology

## 2020-04-26 ENCOUNTER — Other Ambulatory Visit: Payer: Self-pay | Admitting: Radiology

## 2023-09-26 ENCOUNTER — Encounter (HOSPITAL_BASED_OUTPATIENT_CLINIC_OR_DEPARTMENT_OTHER): Payer: Self-pay

## 2023-09-26 ENCOUNTER — Other Ambulatory Visit: Payer: Self-pay

## 2023-09-26 ENCOUNTER — Emergency Department (HOSPITAL_BASED_OUTPATIENT_CLINIC_OR_DEPARTMENT_OTHER): Payer: Medicare Other

## 2023-09-26 ENCOUNTER — Inpatient Hospital Stay (HOSPITAL_BASED_OUTPATIENT_CLINIC_OR_DEPARTMENT_OTHER)
Admission: EM | Admit: 2023-09-26 | Discharge: 2023-10-04 | DRG: 843 | Disposition: A | Discharge status: Home / Self Care | Payer: Medicare Other | Attending: Internal Medicine | Admitting: Internal Medicine

## 2023-09-26 DIAGNOSIS — Z8249 Family history of ischemic heart disease and other diseases of the circulatory system: Secondary | ICD-10-CM

## 2023-09-26 DIAGNOSIS — E878 Other disorders of electrolyte and fluid balance, not elsewhere classified: Secondary | ICD-10-CM | POA: Diagnosis present

## 2023-09-26 DIAGNOSIS — J9382 Other air leak: Secondary | ICD-10-CM | POA: Diagnosis present

## 2023-09-26 DIAGNOSIS — Z8719 Personal history of other diseases of the digestive system: Secondary | ICD-10-CM

## 2023-09-26 DIAGNOSIS — Z8701 Personal history of pneumonia (recurrent): Secondary | ICD-10-CM

## 2023-09-26 DIAGNOSIS — J90 Pleural effusion, not elsewhere classified: Secondary | ICD-10-CM | POA: Diagnosis present

## 2023-09-26 DIAGNOSIS — E860 Dehydration: Secondary | ICD-10-CM | POA: Diagnosis present

## 2023-09-26 DIAGNOSIS — J939 Pneumothorax, unspecified: Secondary | ICD-10-CM | POA: Diagnosis present

## 2023-09-26 DIAGNOSIS — K219 Gastro-esophageal reflux disease without esophagitis: Secondary | ICD-10-CM | POA: Insufficient documentation

## 2023-09-26 DIAGNOSIS — J320 Chronic maxillary sinusitis: Secondary | ICD-10-CM | POA: Diagnosis present

## 2023-09-26 DIAGNOSIS — J342 Deviated nasal septum: Secondary | ICD-10-CM | POA: Diagnosis present

## 2023-09-26 DIAGNOSIS — K2100 Gastro-esophageal reflux disease with esophagitis, without bleeding: Secondary | ICD-10-CM | POA: Diagnosis present

## 2023-09-26 DIAGNOSIS — R0603 Acute respiratory distress: Principal | ICD-10-CM

## 2023-09-26 DIAGNOSIS — Z83438 Family history of other disorder of lipoprotein metabolism and other lipidemia: Secondary | ICD-10-CM

## 2023-09-26 DIAGNOSIS — J9601 Acute respiratory failure with hypoxia: Secondary | ICD-10-CM | POA: Diagnosis not present

## 2023-09-26 DIAGNOSIS — E876 Hypokalemia: Secondary | ICD-10-CM | POA: Diagnosis present

## 2023-09-26 DIAGNOSIS — E781 Pure hyperglyceridemia: Secondary | ICD-10-CM | POA: Diagnosis present

## 2023-09-26 DIAGNOSIS — I10 Essential (primary) hypertension: Secondary | ICD-10-CM | POA: Diagnosis present

## 2023-09-26 DIAGNOSIS — E871 Hypo-osmolality and hyponatremia: Secondary | ICD-10-CM | POA: Diagnosis not present

## 2023-09-26 DIAGNOSIS — J9602 Acute respiratory failure with hypercapnia: Secondary | ICD-10-CM | POA: Diagnosis present

## 2023-09-26 DIAGNOSIS — E0590 Thyrotoxicosis, unspecified without thyrotoxic crisis or storm: Secondary | ICD-10-CM | POA: Diagnosis present

## 2023-09-26 DIAGNOSIS — Z9109 Other allergy status, other than to drugs and biological substances: Secondary | ICD-10-CM

## 2023-09-26 DIAGNOSIS — J948 Other specified pleural conditions: Secondary | ICD-10-CM | POA: Diagnosis present

## 2023-09-26 DIAGNOSIS — Z1152 Encounter for screening for COVID-19: Secondary | ICD-10-CM

## 2023-09-26 DIAGNOSIS — Z79899 Other long term (current) drug therapy: Secondary | ICD-10-CM

## 2023-09-26 DIAGNOSIS — Z7982 Long term (current) use of aspirin: Secondary | ICD-10-CM

## 2023-09-26 DIAGNOSIS — D509 Iron deficiency anemia, unspecified: Secondary | ICD-10-CM | POA: Diagnosis present

## 2023-09-26 DIAGNOSIS — J910 Malignant pleural effusion: Secondary | ICD-10-CM | POA: Diagnosis present

## 2023-09-26 DIAGNOSIS — J189 Pneumonia, unspecified organism: Principal | ICD-10-CM | POA: Diagnosis present

## 2023-09-26 DIAGNOSIS — C801 Malignant (primary) neoplasm, unspecified: Secondary | ICD-10-CM | POA: Diagnosis not present

## 2023-09-26 DIAGNOSIS — E861 Hypovolemia: Secondary | ICD-10-CM | POA: Diagnosis present

## 2023-09-26 DIAGNOSIS — R0602 Shortness of breath: Secondary | ICD-10-CM | POA: Diagnosis not present

## 2023-09-26 LAB — I-STAT VENOUS BLOOD GAS, ED
Acid-Base Excess: 7 mmol/L — ABNORMAL HIGH (ref 0.0–2.0)
Bicarbonate: 30.8 mmol/L — ABNORMAL HIGH (ref 20.0–28.0)
Calcium, Ion: 1.15 mmol/L (ref 1.15–1.40)
HCT: 37 % (ref 36.0–46.0)
Hemoglobin: 12.6 g/dL (ref 12.0–15.0)
O2 Saturation: 61 %
Patient temperature: 98.4
Potassium: 2.1 mmol/L — CL (ref 3.5–5.1)
Sodium: 127 mmol/L — ABNORMAL LOW (ref 135–145)
TCO2: 32 mmol/L (ref 22–32)
pCO2, Ven: 39.5 mm[Hg] — ABNORMAL LOW (ref 44–60)
pH, Ven: 7.5 — ABNORMAL HIGH (ref 7.25–7.43)
pO2, Ven: 29 mm[Hg] — CL (ref 32–45)

## 2023-09-26 LAB — C-REACTIVE PROTEIN: CRP: 0.7 mg/dL (ref <1.0)

## 2023-09-26 LAB — OSMOLALITY: Osmolality: 274 mosm/kg — ABNORMAL LOW (ref 275–295)

## 2023-09-26 LAB — RESP PANEL BY RT-PCR (RSV, FLU A&B, COVID)  RVPGX2
Influenza A by PCR: NEGATIVE
Influenza B by PCR: NEGATIVE
Resp Syncytial Virus by PCR: NEGATIVE
SARS Coronavirus 2 by RT PCR: NEGATIVE

## 2023-09-26 LAB — COMPREHENSIVE METABOLIC PANEL
ALT: 7 U/L (ref 0–44)
AST: 12 U/L — ABNORMAL LOW (ref 15–41)
Albumin: 3.6 g/dL (ref 3.5–5.0)
Alkaline Phosphatase: 75 U/L (ref 38–126)
Anion gap: 9 (ref 5–15)
BUN: 9 mg/dL (ref 8–23)
CO2: 33 mmol/L — ABNORMAL HIGH (ref 22–32)
Calcium: 8.9 mg/dL (ref 8.9–10.3)
Chloride: 85 mmol/L — ABNORMAL LOW (ref 98–111)
Creatinine, Ser: 0.6 mg/dL (ref 0.44–1.00)
GFR, Estimated: >60 mL/min (ref >60)
Glucose, Bld: 130 mg/dL — ABNORMAL HIGH (ref 70–99)
Potassium: 2.4 mmol/L — CL (ref 3.5–5.1)
Sodium: 127 mmol/L — ABNORMAL LOW (ref 135–145)
Total Bilirubin: 1.1 mg/dL (ref 0.0–1.2)
Total Protein: 6.2 g/dL — ABNORMAL LOW (ref 6.5–8.1)

## 2023-09-26 LAB — CBC
HCT: 38.1 % (ref 36.0–46.0)
HCT: 36.6 % (ref 36.0–46.0)
Hemoglobin: 13 g/dL (ref 12.0–15.0)
Hemoglobin: 12.5 g/dL (ref 12.0–15.0)
MCH: 27.7 pg (ref 26.0–34.0)
MCH: 27.8 pg (ref 26.0–34.0)
MCHC: 34.1 g/dL (ref 30.0–36.0)
MCHC: 34.2 g/dL (ref 30.0–36.0)
MCV: 81.2 fL (ref 80.0–100.0)
MCV: 81.3 fL (ref 80.0–100.0)
Platelets: 370 10*3/uL (ref 150–400)
Platelets: 355 10*3/uL (ref 150–400)
RBC: 4.69 MIL/uL (ref 3.87–5.11)
RBC: 4.5 MIL/uL (ref 3.87–5.11)
RDW: 13.1 % (ref 11.5–15.5)
RDW: 13.2 % (ref 11.5–15.5)
WBC: 10.6 10*3/uL — ABNORMAL HIGH (ref 4.0–10.5)
WBC: 14 10*3/uL — ABNORMAL HIGH (ref 4.0–10.5)
nRBC: 0 % (ref 0.0–0.2)
nRBC: 0 % (ref 0.0–0.2)

## 2023-09-26 LAB — MRSA NEXT GEN BY PCR, NASAL: MRSA by PCR Next Gen: NOT DETECTED

## 2023-09-26 LAB — URIC ACID: Uric Acid, Serum: 4.7 mg/dL (ref 2.5–7.1)

## 2023-09-26 LAB — CREATININE, SERUM
Creatinine, Ser: 0.65 mg/dL (ref 0.44–1.00)
GFR, Estimated: >60 mL/min (ref >60)

## 2023-09-26 LAB — PROTEIN, PLEURAL OR PERITONEAL FLUID: Total protein, fluid: 3.8 g/dL

## 2023-09-26 LAB — BODY FLUID CELL COUNT WITH DIFFERENTIAL
Eos, Fluid: 1 %
Lymphs, Fluid: 59 %
Monocyte-Macrophage-Serous Fluid: 18 % — ABNORMAL LOW (ref 50–90)
Neutrophil Count, Fluid: 22 % (ref 0–25)
Total Nucleated Cell Count, Fluid: 66 uL (ref 0–1000)

## 2023-09-26 LAB — TSH: TSH: 0.301 u[IU]/mL — ABNORMAL LOW (ref 0.350–4.500)

## 2023-09-26 LAB — ALBUMIN, PLEURAL OR PERITONEAL FLUID: Albumin, Fluid: 2.2 g/dL

## 2023-09-26 LAB — POTASSIUM: Potassium: 4.2 mmol/L (ref 3.5–5.1)

## 2023-09-26 LAB — OSMOLALITY, URINE: Osmolality, Ur: 148 mosm/kg — ABNORMAL LOW (ref 300–900)

## 2023-09-26 LAB — GLUCOSE, PLEURAL OR PERITONEAL FLUID: Glucose, Fluid: 119 mg/dL

## 2023-09-26 LAB — STREP PNEUMONIAE URINARY ANTIGEN: Strep Pneumo Urinary Antigen: NEGATIVE

## 2023-09-26 LAB — PROCALCITONIN: Procalcitonin: <0.1 ng/mL

## 2023-09-26 LAB — BRAIN NATRIURETIC PEPTIDE: B Natriuretic Peptide: 61.4 pg/mL (ref 0.0–100.0)

## 2023-09-26 LAB — LACTATE DEHYDROGENASE: LDH: 144 U/L (ref 98–192)

## 2023-09-26 LAB — LACTATE DEHYDROGENASE, PLEURAL OR PERITONEAL FLUID: LD, Fluid: 118 U/L — ABNORMAL HIGH (ref 3–23)

## 2023-09-26 LAB — SODIUM, URINE, RANDOM: Sodium, Ur: <10 mmol/L

## 2023-09-26 LAB — CREATININE, URINE, RANDOM: Creatinine, Urine: 51 mg/dL

## 2023-09-26 MED ORDER — SODIUM CHLORIDE 0.9 % IV SOLN
INTRAVENOUS | Status: AC
Start: 2023-09-26 — End: 2023-09-27

## 2023-09-26 MED ORDER — ALBUTEROL SULFATE (2.5 MG/3ML) 0.083% IN NEBU
2.5000 mg | INHALATION_SOLUTION | Freq: Once | RESPIRATORY_TRACT | Status: AC
  Administered 2023-09-26: 2.5 mg via RESPIRATORY_TRACT
  Filled 2023-09-26: qty 3

## 2023-09-26 MED ORDER — HEPARIN SODIUM (PORCINE) 5000 UNIT/ML IJ SOLN
5000.0000 [IU] | Freq: Three times a day (TID) | INTRAMUSCULAR | Status: DC
  Administered 2023-09-26 – 2023-10-04 (×23): 5000 [IU] via SUBCUTANEOUS
  Filled 2023-09-26 (×22): qty 1

## 2023-09-26 MED ORDER — IPRATROPIUM-ALBUTEROL 0.5-2.5 (3) MG/3ML IN SOLN
3.0000 mL | Freq: Once | RESPIRATORY_TRACT | Status: AC
  Administered 2023-09-26: 3 mL via RESPIRATORY_TRACT
  Filled 2023-09-26: qty 3

## 2023-09-26 MED ORDER — POTASSIUM CHLORIDE CRYS ER 20 MEQ PO TBCR
60.0000 meq | EXTENDED_RELEASE_TABLET | Freq: Once | ORAL | Status: AC
  Administered 2023-09-26: 60 meq via ORAL
  Filled 2023-09-26: qty 3

## 2023-09-26 MED ORDER — POTASSIUM CHLORIDE 10 MEQ/100ML IV SOLN
10.0000 meq | INTRAVENOUS | Status: AC
  Administered 2023-09-26 (×2): 10 meq via INTRAVENOUS
  Filled 2023-09-26 (×3): qty 100

## 2023-09-26 MED ORDER — DOXYCYCLINE HYCLATE 100 MG PO TABS
100.0000 mg | ORAL_TABLET | Freq: Two times a day (BID) | ORAL | Status: AC
  Administered 2023-09-26 – 2023-10-02 (×13): 100 mg via ORAL
  Filled 2023-09-26 (×13): qty 1

## 2023-09-26 MED ORDER — ACETAMINOPHEN 325 MG PO TABS: 650.0000 mg | ORAL_TABLET | Freq: Four times a day (QID) | ORAL | Status: DC | PRN

## 2023-09-26 MED ORDER — DIAZEPAM 5 MG/ML IJ SOLN: 2.5000 mg | Freq: Once | INTRAMUSCULAR | Status: DC

## 2023-09-26 MED ORDER — POTASSIUM CHLORIDE 10 MEQ/100ML IV SOLN
10.0000 meq | INTRAVENOUS | Status: AC
  Administered 2023-09-26 (×4): 10 meq via INTRAVENOUS
  Filled 2023-09-26 (×4): qty 100

## 2023-09-26 MED ORDER — ACETAMINOPHEN 650 MG RE SUPP: 650.0000 mg | Freq: Four times a day (QID) | RECTAL | Status: DC | PRN

## 2023-09-26 MED ORDER — BISACODYL 5 MG PO TBEC
5.0000 mg | DELAYED_RELEASE_TABLET | Freq: Every day | ORAL | Status: DC | PRN
  Administered 2023-09-28: 5 mg via ORAL
  Filled 2023-09-26: qty 1

## 2023-09-26 MED ORDER — AMLODIPINE BESYLATE 10 MG PO TABS: 10.0000 mg | ORAL_TABLET | Freq: Every day | ORAL | Status: DC

## 2023-09-26 MED ORDER — SIMVASTATIN 20 MG PO TABS
20.0000 mg | ORAL_TABLET | Freq: Every day | ORAL | Status: DC
  Administered 2023-09-26 – 2023-10-03 (×8): 20 mg via ORAL
  Filled 2023-09-26 (×8): qty 1

## 2023-09-26 MED ORDER — MAGNESIUM SULFATE 2 GM/50ML IV SOLN
2.0000 g | INTRAVENOUS | Status: AC
  Administered 2023-09-26: 2 g via INTRAVENOUS
  Filled 2023-09-26: qty 50

## 2023-09-26 MED ORDER — MAGNESIUM OXIDE -MG SUPPLEMENT 400 (240 MG) MG PO TABS
800.0000 mg | ORAL_TABLET | Freq: Once | ORAL | Status: AC
  Administered 2023-09-26: 800 mg via ORAL
  Filled 2023-09-26: qty 2

## 2023-09-26 MED ORDER — SODIUM CHLORIDE 0.9 % IV SOLN: Freq: Once | INTRAVENOUS | Status: AC

## 2023-09-26 MED ORDER — METHYLPREDNISOLONE SODIUM SUCC 125 MG IJ SOLR
125.0000 mg | Freq: Once | INTRAMUSCULAR | Status: AC
  Administered 2023-09-26: 125 mg via INTRAVENOUS
  Filled 2023-09-26: qty 2

## 2023-09-26 MED ORDER — FENTANYL CITRATE PF 50 MCG/ML IJ SOSY
50.0000 ug | PREFILLED_SYRINGE | Freq: Once | INTRAMUSCULAR | Status: AC
  Administered 2023-09-26: 50 ug via INTRAVENOUS
  Filled 2023-09-26: qty 1

## 2023-09-26 MED ORDER — ALBUTEROL SULFATE (2.5 MG/3ML) 0.083% IN NEBU: 2.5000 mg | INHALATION_SOLUTION | RESPIRATORY_TRACT | Status: DC | PRN

## 2023-09-26 MED ORDER — LIDOCAINE-EPINEPHRINE (PF) 2 %-1:200000 IJ SOLN
20.0000 mL | Freq: Once | INTRAMUSCULAR | Status: AC
  Administered 2023-09-26: 20 mL

## 2023-09-26 MED ORDER — ASPIRIN 81 MG PO TBEC
81.0000 mg | DELAYED_RELEASE_TABLET | Freq: Every day | ORAL | Status: DC
  Administered 2023-09-27 – 2023-10-03 (×7): 81 mg via ORAL
  Filled 2023-09-26 (×8): qty 1

## 2023-09-26 MED ORDER — DIAZEPAM 5 MG/ML IJ SOLN
2.5000 mg | Freq: Once | INTRAMUSCULAR | Status: AC
  Administered 2023-09-26: 2.5 mg via INTRAVENOUS
  Filled 2023-09-26: qty 2

## 2023-09-26 MED ORDER — SODIUM CHLORIDE 0.9 % IV SOLN
3.0000 g | Freq: Four times a day (QID) | INTRAVENOUS | Status: DC
  Administered 2023-09-26 – 2023-09-28 (×8): 3 g via INTRAVENOUS
  Filled 2023-09-26 (×9): qty 8

## 2023-09-26 MED ORDER — PANTOPRAZOLE SODIUM 40 MG PO TBEC
40.0000 mg | DELAYED_RELEASE_TABLET | Freq: Every day | ORAL | Status: DC
  Administered 2023-09-27 – 2023-10-04 (×8): 40 mg via ORAL
  Filled 2023-09-26 (×10): qty 1

## 2023-09-26 MED ORDER — PROMETHAZINE HCL 12.5 MG PO TABS
12.5000 mg | ORAL_TABLET | Freq: Four times a day (QID) | ORAL | Status: DC | PRN
  Filled 2023-09-26: qty 1

## 2023-09-26 NOTE — ED Notes (Signed)
 Called [facility] for transport, pt bed assignment is ready

## 2023-09-26 NOTE — Progress Notes (Signed)
 Pharmacy Antibiotic Note  Amanda Potter is a 78 y.o. female admitted on 09/26/2023 with  aspiration pneumonia .  Pharmacy has been consulted for Unasyn  dosing.  Plan: Start Unasyn  3g IV Q6H F/u cultures, clinical progression, renal function, and LOT Pharmacy will continue to monitor peripherally  Height: 5' 6 (167.6 cm) Weight: 81.6 kg (179 lb 14.3 oz) IBW/kg (Calculated) : 59.3  Temp (24hrs), Avg:97.9 F (36.6 C), Min:97.6 F (36.4 C), Max:98.2 F (36.8 C)  Recent Labs  Lab 09/26/23 1100  WBC 10.6*  CREATININE 0.60    Estimated Creatinine Clearance: 61.4 mL/min (by C-G formula based on SCr of 0.6 mg/dL).    Allergies  Allergen Reactions   Tape Rash    Glue on tape    Antimicrobials this admission: Unasyn  1/1 >>  Microbiology results: Legionella IP BCx: to be collected Sputum: to be collected  MRSA PCR: to be collected  Thank you for allowing pharmacy to be a part of this patient's care.  Amanda Potter 09/26/2023 6:24 PM

## 2023-09-26 NOTE — ED Triage Notes (Signed)
 Onset three weeks of short of breath, congestion.  Cough non productive.  Tightness in chest.  Labored breathing.  Diminished lung sounds

## 2023-09-26 NOTE — H&P (Signed)
 [facility] H&P   Patient Demographics:    Amanda Potter, is a 78 y.o. female  MRN: [id number]   DOB - 1946-09-24  Admit Date - 09/26/2023  Outpatient Primary MD for the patient is Debrah Josette ORN., PA-C    Patient coming from: [facility]  Chief Complaint  Patient presents with   Shortness of Breath      HPI:    Amanda Potter  is a 78 y.o. female, with known past medical history of hypertension, GERD, dyslipidemia, recurrent sinusitis infections, pneumonia in the past, deviated nasal septum, perforated colon during a colonoscopy who was in good state of health until about 3 to 4 weeks ago when she started having left-sided maxillary sinus fullness and redness, she has had issues with sinus infections multiple times in the past.  She subsequently saw her PCP and was given 10-day course of oral antibiotics and steroid taper after which her sinus infections improved however about 7 to 8 days ago she started experiencing some shortness of breath and chest tightness, she became progressively more short of breath with minimal exertion and laying flat, denied any cough or fevers, no nausea vomiting or aspiration, no unexpected weight gain or weight loss, no personal or family history of lung cancers, no history of smoking or tuberculosis, no significant fluid retention and her extremities.    Her shortness of breath continued to get worse so she presented to med [facility] ER today where she was diagnosed with a significantly large right-sided pleural effusion, she underwent right-sided chest tube placement in the ER, appropriate studies were sent out for pleural fluid testing, her shortness of breath improved she was placed on 3 L  nasal cannula oxygen and sent to [facility] for admission.  After the placement of chest tube she feels significantly better, currently denies any headache, no fever chills now or before, shortness of breath much improved, no problems with swallowing food or liquids, no nausea vomiting, no diarrhea or dysuria, no blood in stool or urine.  No focal weakness.    Review of systems:       A full 10 point Review of Systems was done, except as stated above, all other Review of Systems were negative.   With Past History of the following :    Past Medical History:  Diagnosis Date  Deviated nasal septum    Environmental allergies    GERD (gastroesophageal reflux disease)    Hyperlipidemia    Hypertension    Perforation of colon (HCC) 2006   Following colonoscopy   Sinusitis, acute       Past Surgical History:  Procedure Laterality Date   CHOLECYSTECTOMY     COLON SURGERY Right 2006   hemicolectomy   COLONOSCOPY WITH PROPOFOL  N/A 05/30/2019   Procedure: COLONOSCOPY WITH PROPOFOL ;  Surgeon: Rollin Dover, MD;  Location: [facility];  Service: Endoscopy;  Laterality: N/A;   ENTEROSCOPY N/A 05/30/2019   Procedure: ENTEROSCOPY;  Surgeon: Rollin Dover, MD;  Location: [facility];  Service: Endoscopy;  Laterality: N/A;   HERNIA REPAIR  [id number]   INCISIONAL HERNIA REPAIR N/A 03/03/2013   Procedure: LAPAROSCOPIC INCISIONAL HERNIA REPAIR;  Surgeon: Krystal JINNY Russell, MD;  Location: [facility];  Service: General;  Laterality: N/A;  LYSIS OF ADHESIONS FOR 60 MINUTES   INSERTION OF MESH N/A 03/03/2013   Procedure: INSERTION OF MESH;  Surgeon: Krystal JINNY Russell, MD;  Location: [facility];  Service: General;  Laterality: N/A;   POLYPECTOMY  05/30/2019   Procedure: POLYPECTOMY;  Surgeon: Rollin Dover, MD;  Location: [facility];  Service: Endoscopy;;   TONSILLECTOMY     TUBAL LIGATION        Social History:     Social History   Tobacco Use   Smoking status: Never   Smokeless tobacco: Never   Substance Use Topics   Alcohol  use: No        Family History :     Family History  Problem Relation Age of Onset   Hearing loss Mother    Diabetes Mother    Hyperlipidemia Mother    Hypertension Mother    Heart disease Father       Home Medications:   Prior to Admission medications   Medication Sig Start Date End Date Taking? Authorizing Provider  amLODipine  (NORVASC ) 10 MG tablet Take 10 mg by mouth daily.     [provider]  aspirin  81 MG tablet Take 81 mg by mouth daily at 12 noon.     [provider]  diphenhydrAMINE  (BENADRYL ) 25 MG tablet Take 25 mg by mouth daily as needed for allergies.    [provider]  fluticasone  (FLONASE ) 50 MCG/ACT nasal spray Place 1 spray into both nostrils daily as needed for allergies or rhinitis.    [provider]  naproxen sodium (ANAPROX) 220 MG tablet Take 220 mg by mouth 2 (two) times daily as needed (for pain).    [provider]  omeprazole (PRILOSEC) 20 MG capsule Take 20 mg by mouth daily.    [provider]  Polyvinyl Alcohol -Povidone (REFRESH OP) Place 1 drop into both eyes daily as needed (dry eyes).    [provider]  ramipril (ALTACE) 10 MG capsule Take 10 mg by mouth daily.    [provider]  simvastatin  (ZOCOR ) 20 MG tablet Take 20 mg by mouth at bedtime.     [provider]     Allergies:     Allergies  Allergen Reactions   Tape Rash    Glue on tape     Physical Exam:   Vitals  Blood pressure 102/65, pulse 97, temperature 97.6 F (36.4 C), temperature source Oral, resp. rate 20, height 5' 6 (1.676 m), weight 81.6 kg, SpO2 93%.   1. General Patient elderly Caucasian female lying in hospital bed in no discomfort wearing 3  L nasal cannula oxygen and has right-sided chest tube,  2. Normal affect and insight, Not Suicidal or Homicidal, Awake Alert,   3. No F.N deficits, ALL C.Nerves Intact, Strength 5/5 all 4 extremities,  Sensation intact all 4 extremities, Plantars down going.  4. Ears and Eyes appear Normal, Conjunctivae clear, PERRLA. Moist Oral Mucosa.  5. Supple Neck, No JVD, No cervical lymphadenopathy appriciated, No Carotid Bruits.  6. Symmetrical Chest wall movement, diminished and coarse right-sided breath sounds, good air movement on the left side without any rales or wheezes.  7. RRR, No Gallops, Rubs or Murmurs, No Parasternal Heave.  8. Positive Bowel Sounds, Abdomen Soft, No tenderness, No organomegaly appriciated,No rebound -guarding or rigidity.  9.  No Cyanosis, Normal Skin Turgor, No Skin Rash or Bruise.  10. Good muscle tone,  joints appear normal , no effusions, Normal ROM.  11. No Palpable Lymph Nodes in Neck or Axillae      Data Review:   Recent Labs  Lab 09/26/23 1100 09/26/23 1138  WBC 10.6*  --   HGB 13.0 12.6  HCT 38.1 37.0  PLT 370  --   MCV 81.2  --   MCH 27.7  --   MCHC 34.1  --   RDW 13.1  --     Recent Labs  Lab 09/26/23 1100 09/26/23 1101 09/26/23 1138  NA 127*  --  127*  K 2.4*  --  2.1*  CL 85*  --   --   CO2 33*  --   --   ANIONGAP 9  --   --   GLUCOSE 130*  --   --   BUN 9  --   --   CREATININE 0.60  --   --   AST 12*  --   --   ALT 7  --   --   ALKPHOS 75  --   --   BILITOT 1.1  --   --   ALBUMIN 3.6  --   --   BNP  --  61.4  --   CALCIUM 8.9  --   --     No results found for: CHOL, HDL, LDLCALC, LDLDIRECT, TRIG, CHOLHDL  Recent Labs  Lab 09/26/23 1100 09/26/23 1101  BNP  --  61.4  CALCIUM 8.9  --     Recent Labs  Lab 09/26/23 1100  WBC 10.6*  PLT 370  CREATININE 0.60    Urinalysis No results found for: COLORURINE, APPEARANCEUR, LABSPEC, PHURINE, GLUCOSEU, HGBUR, BILIRUBINUR, KETONESUR, PROTEINUR, UROBILINOGEN, NITRITE, LEUKOCYTESUR    Imaging Results:    DG Chest Port 1 View Result Date: 09/26/2023 CLINICAL DATA:  [id number] Chest tube in place [id number] EXAM: PORTABLE CHEST 1 VIEW  COMPARISON:  Chest radiograph from earlier today. FINDINGS: Stable cardiomediastinal silhouette with normal heart size and moderate hiatal hernia. Right basilar pigtail pleural catheter in place. Persistent moderate right hydropneumothorax with substantially decreased right pleural effusion component and with new moderate ex vacuo right pneumothorax component. No mediastinal shift. No left pneumothorax. No left pleural effusion. Improved aeration of the right lung with persistent mild to moderate patchy bibasilar lung opacities. No overt pulmonary edema. IMPRESSION: 1. Persistent moderate right hydropneumothorax status post right basilar pigtail pleural catheter placement, with substantially decreased right pleural effusion component and with new moderate ex vacuo right pneumothorax component. No mediastinal shift. 2. Improved aeration of the right lung with persistent mild to moderate patchy bibasilar lung opacities. 3. Chronic moderate hiatal hernia. Electronically Signed   By:  Selinda DELENA Blue M.D.   On: 09/26/2023 14:38   DG Chest Port 1 View Result Date: 09/26/2023 CLINICAL DATA:  Shortness of breath. EXAM: PORTABLE CHEST 1 VIEW COMPARISON:  12/10/2017 FINDINGS: Right base collapse/consolidation with large right pleural effusion. Left lung is hyperexpanded but appears clear. Moderate to large hiatal hernia. The cardio pericardial silhouette is enlarged. Bones are diffusely demineralized. Telemetry leads overlie the chest. IMPRESSION: 1. Right base collapse/consolidation with large right pleural effusion. Given the unilateral disease, neoplasm is a concern. Chest CT (with contrast if renal function permits) recommended to further evaluate. 2. Moderate to large hiatal hernia. Electronically Signed   By: Camellia Candle M.D.   On: 09/26/2023 11:50    My personal review of EKG: Rhythm sinus tachycardia with 110 bpm, nonspecific ST changes    Assessment & Plan:   1.  Acute hypoxic respiratory failure due to  significant right-sided pleural effusion.  In a patient with recent history of complicated left maxillary sinus infection requiring 10-day course of antibiotics and steroid taper.  Patient already has right-sided chest tube placed at [facility] ER, preliminary fluid studies have been sent, upon visual inspection looks highly suspicious for exudative pleural effusion.  It is quite possible that during her maxillary sinus infection she had postnasal drip that she could have aspirated during her sleep and developed a silent pneumonia with exudative effusion, due to her age underlying malignancy cannot be ruled out although she does not have any history of smoking, personal or family history of lung cancers or any unexpected weight loss.  Pulmonary critical care has been consulted, they will manage the chest tube, she will likely require a CT chest tomorrow but will defer this to pulmonary.  For now empiric antibiotics which will be Unasyn  and doxycycline , blood cultures, sputum cultures, MRSA nasal PCR.  Check echocardiogram although I do not think this is CHF, continue with supportive care with oxygen, already better, encouraged to sit in chair in the daytime use I-S for pulmonary toiletry.  Will continue to monitor.   2.  Significant hypokalemia, mild to moderate hyponatremia.  Appears to be dehydrated clinically, potassium has been appropriately replaced, gentle IV fluids, serum osmolality and uric acid level, urine osmolality and sodium levels ordered.  Repeat BMP along with magnesium  and phosphorus levels in the morning.  Have already given her some empiric magnesium  replacement as well.  3.  Hypertension.  Blood pressure soft, likely not eating and drinking well due to her overall poor health lately, hold blood pressure medications hydrate and monitor.  4.  GERD.  PPI.  5.  Dyslipidemia.  Statin.  6. Recurrent sinusitis.  Outpatient ENT follow-up recommended   DVT Prophylaxis Heparin     AM Labs Ordered, also please review Full Orders  Family Communication: Admission, patients condition and plan of care including tests being ordered have been discussed with the patient and daughter who indicate understanding and agree with the plan and Code Status.  Code Status full code  Likely DC to be decided  Condition fair  Consults called: PCCM  Admission status: Observation by the accepting MD  Time spent in minutes : 45 minutes  Signature  -    Lavada Stank M.D on 09/26/2023 at 6:17 PM   -  To page go to [email or web address]

## 2023-09-26 NOTE — ED Provider Notes (Signed)
 [place] EMERGENCY DEPARTMENT AT [facility] Provider Note   CSN: [id number] Arrival date & time: 09/26/23  1005     History  Chief Complaint  Patient presents with   Shortness of Breath    Amanda Potter is a 78 y.o. female who presents emergency department with chief complaint of cough and shortness of breath.  History is gathered by review of outside records from her PCP, daughter at bedside and the patient.  Patient states that she is normally healthy.  For the past month she has had progressively worsening shortness of breath now severe dyspnea at rest and even worse with any sort of movement, orthopnea without peripheral edema and persistent coughing.  She was seen by her PCP 3 weeks ago, presumed to have a sinus infection per EMR and started on doxycycline  for 10 days, given a shot of Decadron , given a prednisone taper, and an albuterol  inhaler.  Per report from the patient she was told not to use the inhaler but if she had to she needed to go to the ER so she has not been using it.  Patient reports that she is now sleeping in an upright position sitting because she is stifled and gasping for air if she tries to lay back.  She reports that even slight exertion including movement of position or trying to stand causes her to gasp for air.  She has no previous history of the same.  She denies a history of COPD, asthma.  She has had no fever.  Her cough is nonproductive.  She feels a sense of tightness and pressure.  She cannot stop coughing.   Shortness of Breath      Home Medications Prior to Admission medications   Medication Sig Start Date End Date Taking? Authorizing Provider  amLODipine  (NORVASC ) 10 MG tablet Take 10 mg by mouth daily.     [provider]  aspirin  81 MG tablet Take 81 mg by mouth daily at 12 noon.     [provider]  diphenhydrAMINE  (BENADRYL ) 25 MG tablet Take 25 mg by mouth daily as needed for allergies.    [provider]   fluticasone  (FLONASE ) 50 MCG/ACT nasal spray Place 1 spray into both nostrils daily as needed for allergies or rhinitis.    [provider]  naproxen sodium (ANAPROX) 220 MG tablet Take 220 mg by mouth 2 (two) times daily as needed (for pain).    [provider]  omeprazole (PRILOSEC) 20 MG capsule Take 20 mg by mouth daily.    [provider]  Polyvinyl Alcohol -Povidone (REFRESH OP) Place 1 drop into both eyes daily as needed (dry eyes).    [provider]  ramipril (ALTACE) 10 MG capsule Take 10 mg by mouth daily.    [provider]  simvastatin  (ZOCOR ) 20 MG tablet Take 20 mg by mouth at bedtime.     [provider]      Allergies    Tape    Review of Systems   Review of Systems  Respiratory:  Positive for shortness of breath.     Physical Exam Updated Vital Signs BP 126/62 (BP Location: Left Arm)   Pulse (!) 109   Temp 98.2 F (36.8 C) (Oral)   Resp (!) 26   Ht 5' 6 (1.676 m)   Wt 81.6 kg   SpO2 94%   BMI 29.04 kg/m  Physical Exam Vitals and nursing note reviewed.  Constitutional:      General: She  is not in acute distress.    Appearance: She is well-developed. She is not diaphoretic.  HENT:     Head: Normocephalic and atraumatic.     Right Ear: External ear normal.     Left Ear: External ear normal.     Nose: Nose normal.     Mouth/Throat:     Mouth: Mucous membranes are moist.  Eyes:     General: No scleral icterus.    Conjunctiva/sclera: Conjunctivae normal.  Cardiovascular:     Rate and Rhythm: Normal rate and regular rhythm.     Heart sounds: Normal heart sounds. No murmur heard.    No friction rub. No gallop.  Pulmonary:     Effort: Tachypnea and respiratory distress (moderate, labored) present.     Breath sounds: Examination of the left-upper field reveals wheezing. Examination of the left-middle field reveals wheezing. Examination of the right-lower field reveals rales. Examination of the left-lower  field reveals wheezing. Decreased breath sounds (Absent breath sounds, all lung field on the Right), wheezing and rales present. No rhonchi.  Abdominal:     General: Bowel sounds are normal. There is no distension.     Palpations: Abdomen is soft. There is no mass.     Tenderness: There is no abdominal tenderness. There is no guarding.  Musculoskeletal:     Cervical back: Normal range of motion.  Skin:    General: Skin is warm and dry.  Neurological:     Mental Status: She is alert and oriented to person, place, and time.  Psychiatric:        Behavior: Behavior normal.     ED Results / Procedures / Treatments   Labs (all labs ordered are listed, but only abnormal results are displayed) Labs Reviewed  CBC - Abnormal; Notable for the following components:      Result Value   WBC 10.6 (*)    All other components within normal limits  RESP PANEL BY RT-PCR (RSV, FLU A&B, COVID)  [id number]  COMPREHENSIVE METABOLIC PANEL  BRAIN NATRIURETIC PEPTIDE  BLOOD GAS, VENOUS  I-STAT VENOUS BLOOD GAS, ED    EKG None  Radiology No results found.  Procedures CHEST TUBE INSERTION  Date/Time: 09/26/2023 1:42 PM  Performed by: Arloa Chroman, PA-C Authorized by: Arloa Chroman, PA-C   Consent:    Consent obtained:  Written and verbal   Consent given by:  Patient   Risks discussed:  Bleeding, damage to surrounding structures, incomplete drainage, infection, nerve damage and pain   Alternatives discussed:  No treatment and delayed treatment Universal protocol:    Procedure explained and questions answered to patient or proxy's satisfaction: yes     Relevant documents present and verified: yes     Test results available: yes     Imaging studies available: yes     Required blood products, implants, devices, and special equipment available: yes     Site/side marked: yes     Immediately prior to procedure, a time out was called: yes (12:50)     Patient identity confirmed:  Verbally with  patient Pre-procedure details:    Skin preparation:  Chlorhexidine    Preparation: Patient was prepped and draped in the usual sterile fashion   Sedation:    Sedation type:  Anxiolysis Anesthesia:    Anesthesia method:  Local infiltration   Local anesthetic:  Lidocaine  1% w/o epi Procedure details:    Placement location:  R lateral   Scalpel size:  11   Tube size (Fr):  Minicatheter (14 fr pigtail)   Dissection instrument: Dilator, Seldinger technique.   Tube connected to:  Water seal   Drainage characteristics:  Yellow and clear   Suture material:  2-0 silk   Dressing: Biopatch, sterile Tegaderm. Post-procedure details:    Post-insertion x-ray findings: tube in good position     Procedure completion:  Tolerated well, no immediate complications .Critical Care  Performed by: Arloa Chroman, PA-C Authorized by: Arloa Chroman, PA-C   Critical care provider statement:    Critical care time (minutes):  75   Critical care time was exclusive of:  Separately billable procedures and treating other patients   Critical care was necessary to treat or prevent imminent or life-threatening deterioration of the following conditions:  Respiratory failure and metabolic crisis (Hypokalemia, respiratory distress)   Critical care was time spent personally by me on the following activities:  Development of treatment plan with patient or surrogate, discussions with consultants, evaluation of patient's response to treatment, examination of patient, ordering and review of laboratory studies, ordering and review of radiographic studies, ordering and performing treatments and interventions, pulse oximetry, re-evaluation of patient's condition, review of old charts and interpretation of cardiac output measurements     Medications Ordered in ED Medications  ipratropium-albuterol  (DUONEB) 0.5-2.5 (3) MG/3ML nebulizer solution 3 mL (has no administration in time range)  magnesium  sulfate IVPB 2 g 50 mL (has  no administration in time range)  diazepam  (VALIUM ) injection 2.5 mg (has no administration in time range)  ipratropium-albuterol  (DUONEB) 0.5-2.5 (3) MG/3ML nebulizer solution 3 mL (3 mLs Nebulization Given 09/26/23 1038)  albuterol  (PROVENTIL ) (2.5 MG/3ML) 0.083% nebulizer solution 2.5 mg (2.5 mg Nebulization Given 09/26/23 1038)  methylPREDNISolone  sodium succinate (SOLU-MEDROL ) 125 mg/2 mL injection 125 mg (125 mg Intravenous Given 09/26/23 1125)    ED Course/ Medical Decision Making/ A&P Clinical Course as of 09/26/23 1358  Wed Sep 26, 2023  1132 Potassium(!!): 2.4 [AH]  1132 Sodium(!): 127 [AH]  1132 Glucose(!): 130 [AH]  1132 DG Chest Port 1 [employer] read of patient's one-view portable chest x-ray shows a large pleural effusion. [AH]  1143 pH, Ven(!): 7.500 [AH]  1150 Discussed with Dr. Theophilus of pulmonary critical care medicine.  He is recommending chest tube placement and admission.  I discussed this with the patient he agrees with and consents to the procedure.  She understands all risks and benefits. [AH]  1200 Case discussed with Dr. Marolyn Scurry who will admit the patient. [AH]    Clinical Course User Index [AH] Arloa Chroman, PA-C                                 Medical Decision Making Amount and/or Complexity of Data Reviewed Labs: ordered. Decision-making details documented in ED Course. Radiology: ordered. Decision-making details documented in ED Course.  Risk Prescription drug management. Decision regarding hospitalization.   This patient presents to the ED with chief complaint(s) of sob with pertinent past medical history of  has a past medical history of Deviated nasal septum, Environmental allergies, GERD (gastroesophageal reflux disease), Hyperlipidemia, Hypertension, Perforation of colon (HCC) (2006), and Sinusitis, acute.  which further complicates the presenting complaint. The complaint involves an extensive differential diagnosis and treatment options and also  carries with it a high risk of complications and morbidity.    The differential diagnosis includes The emergent differential diagnosis for shortness of breath includes, but is not limited to, Pulmonary edema, bronchoconstriction, Pneumonia,  Pulmonary embolism, Pneumotherax/ Hemothorax, Dysrythmia, ACS.     The initial plan is to order labs, imaging, cardiac monitor,  and to treat patient with duounebs, steroids, mag for sob  Additional Tests and treatment considered: I considered CT scan however will wait for full reinflation of the lung  Additional history obtained: Additional history obtained from family Records reviewed Primary Care Documents  Reassessment and review (also see workup area): Lab Tests: I Ordered, and personally interpreted labs.  The pertinent results include: Imaging Studies: I ordered and independently visualized and interpreted the following imaging  this x-ray   which showed  Initial chest x-ray which shows a large right sided pleural effusion.  Wet read of the postprocedural chest x-ray shows the chest tube in good alignment, reinflated lung.  Awaiting official read  The interpretation of the imaging was limited to assessing for emergent pathology, for which purpose it was ordered.  Consultations Obtained: As per ED course.  I also consulted with Dr. Marolyn Lance who will admit the patient to the hospitalist service  Medicines ordered and prescription drug management: I ordered the following medications DuoNebs, Valium  and fentanyl  for lysis and procedure   Reevaluation of the patient after these medicines showed that the patient    improved  Social Determinants of Health: SDOH Screenings   Tobacco Use: Low Risk  (09/26/2023)     Cardiac Monitoring: The patient was maintained on a cardiac monitor.  I personally viewed and interpreted the cardiac monitor which showed an underlying rhythm of:  sinus rhythm  Complexity of problems addressed: Patient's  presentation is most consistent with  acute presentation with potential threat to life or bodily function During patient's assessment  Disposition: After consideration of the diagnostic results and the patient's response to treatment,  I feel that the patent would benefit from admission   .          Final Clinical Impression(s) / ED Diagnoses Final diagnoses:  None    Rx / DC Orders ED Discharge Orders     None         Arloa Chroman, PA-C 09/26/23 1416    Patsey Lot, MD 09/26/23 1454

## 2023-09-26 NOTE — Progress Notes (Signed)
 eLink Physician-Brief Progress Note Patient Name: Amanda Potter DOB: [birth year] MRN: [id number]   Date of Service  09/26/2023  HPI/Events of Note  78 year old female with a history of right-sided pleural To be secondary to right-sided pneumonia hypoxic respiratory failure.  Chest tube in place with hydropneumothorax on chest radiography.  Notified that the patient has an air leak  eICU Interventions  An air leak is an expected outcome of a hydropneumothorax.  Maintain the chest tube to -20 suction.  No intervention is indicated at this time.     Intervention Category Minor Interventions: Clinical assessment - ordering diagnostic tests  Tanita Palinkas 09/26/2023, 7:33 PM

## 2023-09-26 NOTE — Progress Notes (Signed)
 Patient transferred to [facility] to obtain specimens for urine studies. After sitting on the Bsc and standing up patient c/o sob and chest tube container indicating large air leak MD on call for Pulmonary pager number contacted via E-link. Information given about issue awaiting orders and Pulmonary to come to bedside to evaluate air leak. Next shift RN briefed on situation.  Patient in bed with call light in reach safety ensured with current plan of care in progress

## 2023-09-26 NOTE — Consult Note (Signed)
 NAME:  Amanda Potter, MRN:  [id number], DOB:  June 07, 1946, LOS: 0 ADMISSION DATE:  09/26/2023, CONSULTATION DATE: 09/26/2023 REFERRING MD: Rankin River, MD, CHIEF COMPLAINT: Pneumonia, pleural effusion  History of Present Illness:   78 year old with history of GERD, hyperlipidemia, hypertension, sinusitis presenting with progressive cough, shortness of breath.  Symptoms started about 3 weeks ago with a sinus infection which proceeded to cough and dyspnea.  Was given doxycycline , Decadron  and prednisone taper and albuterol  inhaler by primary care as an outpatient.  Complains of worsening dyspnea on minor exertion, inability to lay flat.  Found to have a large right effusion on chest x-ray.  Chest tube placed by emergency room and transferred to [facility] for further management  Pertinent  Medical History    has a past medical history of Deviated nasal septum, Environmental allergies, GERD (gastroesophageal reflux disease), Hyperlipidemia, Hypertension, Perforation of colon (HCC) (2006), and Sinusitis, acute.   Significant Hospital Events: Including procedures, antibiotic start and stop dates in addition to other pertinent events     Interim History / Subjective:    Objective   Blood pressure 102/65, pulse 97, temperature 97.6 F (36.4 C), temperature source Oral, resp. rate 20, height 5' 6 (1.676 m), weight 81.6 kg, SpO2 93%.        Intake/Output Summary (Last 24 hours) at 09/26/2023 1655 Last data filed at 09/26/2023 1412 Gross per 24 hour  Intake --  Output 2000 ml  Net -2000 ml   Filed Weights   09/26/23 1021  Weight: 81.6 kg    Examination: Blood pressure 102/65, pulse 97, temperature 97.6 F (36.4 C), temperature source Oral, resp. rate 20, height 5' 6 (1.676 m), weight 81.6 kg, SpO2 93%. Gen:      No acute distress HEENT:  EOMI, sclera anicteric Neck:     No masses; no thyromegaly Lungs:    Diminished breath sounds on the right CV:         Regular rate and rhythm; no  murmurs Abd:      + bowel sounds; soft, non-tender; no palpable masses, no distension Ext:    No edema; adequate peripheral perfusion Skin:      Warm and dry; no rash Neuro: alert and oriented x 3 Psych: normal mood and affect   Lab/imaging reviewed Significant for sodium 127, potassium 2.1 WBC 10.6, hemoglobin 12.6, platelets 370  Chest x-ray 1/1 reviewed with right consolidation with large effusion, moderate to large hiatal hernia  Resolved Hospital Problem list     Assessment & Plan:  Acute hypoxic respiratory failure Right effusion likely secondary to pneumonia Lesser concern for malignancy as she is a non-smoker Review of chest x-ray shows incomplete expansion of lung with possible entrapment Chest tube placed to suction which may reexpand the lung Will review pleural fluid studies.  Order CT chest without contrast for tomorrow a.m. Consider intrapleural lytics based on review of CT scan Recommend ceftriaxone, azithromycin  for community-acquired pneumonia coverage  Hyponatremia, hypokalemia Replete electrolytes, can start normal saline and follow labs.  Best Practice (right click and Reselect all SmartList Selections daily)   Diet/type: Regular consistency (see orders) DVT prophylaxis not indicated Pressure ulcer(s): N/A GI prophylaxis: N/A Lines: N/A Foley:  N/A Code Status:  full code Last date of multidisciplinary goals of care discussion []   Signature:   Shatara Stanek MD [facility] See Amion for pager  If no response to pager , please call [phone] until 7pm After 7:00 pm call Elink  [phone]  09/26/2023, 6:07 PM

## 2023-09-27 ENCOUNTER — Observation Stay (HOSPITAL_COMMUNITY): Payer: Medicare Other

## 2023-09-27 DIAGNOSIS — Z83438 Family history of other disorder of lipoprotein metabolism and other lipidemia: Secondary | ICD-10-CM | POA: Diagnosis not present

## 2023-09-27 DIAGNOSIS — E871 Hypo-osmolality and hyponatremia: Secondary | ICD-10-CM | POA: Diagnosis present

## 2023-09-27 DIAGNOSIS — I5031 Acute diastolic (congestive) heart failure: Secondary | ICD-10-CM | POA: Diagnosis not present

## 2023-09-27 DIAGNOSIS — I10 Essential (primary) hypertension: Secondary | ICD-10-CM | POA: Diagnosis present

## 2023-09-27 DIAGNOSIS — Z1152 Encounter for screening for COVID-19: Secondary | ICD-10-CM | POA: Diagnosis not present

## 2023-09-27 DIAGNOSIS — J9382 Other air leak: Secondary | ICD-10-CM | POA: Diagnosis present

## 2023-09-27 DIAGNOSIS — Z79899 Other long term (current) drug therapy: Secondary | ICD-10-CM | POA: Diagnosis not present

## 2023-09-27 DIAGNOSIS — J939 Pneumothorax, unspecified: Secondary | ICD-10-CM | POA: Diagnosis present

## 2023-09-27 DIAGNOSIS — J9601 Acute respiratory failure with hypoxia: Secondary | ICD-10-CM | POA: Diagnosis present

## 2023-09-27 DIAGNOSIS — E860 Dehydration: Secondary | ICD-10-CM | POA: Diagnosis present

## 2023-09-27 DIAGNOSIS — J910 Malignant pleural effusion: Secondary | ICD-10-CM | POA: Diagnosis present

## 2023-09-27 DIAGNOSIS — J320 Chronic maxillary sinusitis: Secondary | ICD-10-CM | POA: Diagnosis present

## 2023-09-27 DIAGNOSIS — J189 Pneumonia, unspecified organism: Secondary | ICD-10-CM | POA: Diagnosis present

## 2023-09-27 DIAGNOSIS — Z8719 Personal history of other diseases of the digestive system: Secondary | ICD-10-CM | POA: Diagnosis not present

## 2023-09-27 DIAGNOSIS — E861 Hypovolemia: Secondary | ICD-10-CM | POA: Diagnosis present

## 2023-09-27 DIAGNOSIS — J90 Pleural effusion, not elsewhere classified: Secondary | ICD-10-CM | POA: Diagnosis not present

## 2023-09-27 DIAGNOSIS — J948 Other specified pleural conditions: Secondary | ICD-10-CM | POA: Diagnosis present

## 2023-09-27 DIAGNOSIS — E0590 Thyrotoxicosis, unspecified without thyrotoxic crisis or storm: Secondary | ICD-10-CM | POA: Diagnosis present

## 2023-09-27 DIAGNOSIS — E878 Other disorders of electrolyte and fluid balance, not elsewhere classified: Secondary | ICD-10-CM | POA: Diagnosis present

## 2023-09-27 DIAGNOSIS — J9602 Acute respiratory failure with hypercapnia: Secondary | ICD-10-CM | POA: Diagnosis present

## 2023-09-27 DIAGNOSIS — Z8249 Family history of ischemic heart disease and other diseases of the circulatory system: Secondary | ICD-10-CM | POA: Diagnosis not present

## 2023-09-27 DIAGNOSIS — C801 Malignant (primary) neoplasm, unspecified: Secondary | ICD-10-CM | POA: Diagnosis present

## 2023-09-27 DIAGNOSIS — R0602 Shortness of breath: Secondary | ICD-10-CM | POA: Diagnosis present

## 2023-09-27 DIAGNOSIS — D509 Iron deficiency anemia, unspecified: Secondary | ICD-10-CM | POA: Diagnosis present

## 2023-09-27 DIAGNOSIS — E876 Hypokalemia: Secondary | ICD-10-CM | POA: Diagnosis present

## 2023-09-27 DIAGNOSIS — K2100 Gastro-esophageal reflux disease with esophagitis, without bleeding: Secondary | ICD-10-CM | POA: Diagnosis present

## 2023-09-27 DIAGNOSIS — E781 Pure hyperglyceridemia: Secondary | ICD-10-CM | POA: Diagnosis present

## 2023-09-27 LAB — CBC WITH DIFFERENTIAL/PLATELET
Abs Immature Granulocytes: 0.07 10*3/uL (ref 0.00–0.07)
Basophils Absolute: 0 10*3/uL (ref 0.0–0.1)
Basophils Relative: 0 %
Eosinophils Absolute: 0 10*3/uL (ref 0.0–0.5)
Eosinophils Relative: 0 %
HCT: 37.5 % (ref 36.0–46.0)
Hemoglobin: 12.8 g/dL (ref 12.0–15.0)
Immature Granulocytes: 1 %
Lymphocytes Relative: 10 %
Lymphs Abs: 1 10*3/uL (ref 0.7–4.0)
MCH: 27.8 pg (ref 26.0–34.0)
MCHC: 34.1 g/dL (ref 30.0–36.0)
MCV: 81.3 fL (ref 80.0–100.0)
Monocytes Absolute: 0.5 10*3/uL (ref 0.1–1.0)
Monocytes Relative: 5 %
Neutro Abs: 8.7 10*3/uL — ABNORMAL HIGH (ref 1.7–7.7)
Neutrophils Relative %: 84 %
Platelets: 402 10*3/uL — ABNORMAL HIGH (ref 150–400)
RBC: 4.61 MIL/uL (ref 3.87–5.11)
RDW: 13.4 % (ref 11.5–15.5)
WBC: 10.4 10*3/uL (ref 4.0–10.5)
nRBC: 0 % (ref 0.0–0.2)

## 2023-09-27 LAB — COMPREHENSIVE METABOLIC PANEL
ALT: 9 U/L (ref 0–44)
AST: 16 U/L (ref 15–41)
Albumin: 2.5 g/dL — ABNORMAL LOW (ref 3.5–5.0)
Alkaline Phosphatase: 63 U/L (ref 38–126)
Anion gap: 12 (ref 5–15)
BUN: <5 mg/dL — ABNORMAL LOW (ref 8–23)
CO2: 24 mmol/L (ref 22–32)
Calcium: 8.5 mg/dL — ABNORMAL LOW (ref 8.9–10.3)
Chloride: 93 mmol/L — ABNORMAL LOW (ref 98–111)
Creatinine, Ser: 0.64 mg/dL (ref 0.44–1.00)
GFR, Estimated: >60 mL/min (ref >60)
Glucose, Bld: 139 mg/dL — ABNORMAL HIGH (ref 70–99)
Potassium: 4 mmol/L (ref 3.5–5.1)
Sodium: 129 mmol/L — ABNORMAL LOW (ref 135–145)
Total Bilirubin: 0.9 mg/dL (ref 0.0–1.2)
Total Protein: 5.9 g/dL — ABNORMAL LOW (ref 6.5–8.1)

## 2023-09-27 LAB — ECHOCARDIOGRAM COMPLETE
AR max vel: 3.14 cm2
AV Area VTI: 3.43 cm2
AV Area mean vel: 3.06 cm2
AV Mean grad: 6 mm[Hg]
AV Peak grad: 13.1 mm[Hg]
Ao pk vel: 1.81 m/s
Area-P 1/2: 5.2 cm2
Height: 66 in
S' Lateral: 2.2 cm
Weight: 2878.33 [oz_av]

## 2023-09-27 LAB — MAGNESIUM: Magnesium: 2.2 mg/dL (ref 1.7–2.4)

## 2023-09-27 LAB — RESPIRATORY PANEL BY PCR

## 2023-09-27 LAB — BRAIN NATRIURETIC PEPTIDE: B Natriuretic Peptide: 77.5 pg/mL (ref 0.0–100.0)

## 2023-09-27 LAB — PATHOLOGIST SMEAR REVIEW

## 2023-09-27 LAB — T4, FREE: Free T4: 1.47 ng/dL — ABNORMAL HIGH (ref 0.61–1.12)

## 2023-09-27 LAB — PHOSPHORUS: Phosphorus: 3 mg/dL (ref 2.5–4.6)

## 2023-09-27 MED ORDER — KETOROLAC TROMETHAMINE 15 MG/ML IJ SOLN
15.0000 mg | Freq: Four times a day (QID) | INTRAMUSCULAR | Status: AC
  Administered 2023-09-27 – 2023-10-01 (×17): 15 mg via INTRAVENOUS
  Filled 2023-09-27 (×18): qty 1

## 2023-09-27 MED ORDER — PERFLUTREN LIPID MICROSPHERE
1.0000 mL | INTRAVENOUS | Status: AC | PRN
  Administered 2023-09-27: 3 mL via INTRAVENOUS

## 2023-09-27 MED ORDER — SODIUM CHLORIDE 0.9 % IV SOLN: INTRAVENOUS | Status: AC

## 2023-09-27 NOTE — Progress Notes (Signed)
 Echocardiogram 2D Echocardiogram has been performed.  Amanda Potter 09/27/2023, 9:55 AM

## 2023-09-27 NOTE — Progress Notes (Signed)
 NAME:  Amanda Potter, MRN:  [id number], DOB:  03-14-1946, LOS: 0 ADMISSION DATE:  09/26/2023, CONSULTATION DATE: 09/26/2023 REFERRING MD: Rankin River, MD, CHIEF COMPLAINT: Pneumonia, pleural effusion  History of Present Illness:   78 year old with history of GERD, hyperlipidemia, hypertension, sinusitis presenting with progressive cough, shortness of breath.  Symptoms started about 3 weeks ago with a sinus infection which proceeded to cough and dyspnea.  Was given doxycycline , Decadron  and prednisone taper and albuterol  inhaler by primary care as an outpatient.  Complains of worsening dyspnea on minor exertion, inability to lay flat.  Found to have a large right effusion on chest x-ray.  Chest tube placed by emergency room and transferred to [facility] for further management  Pertinent  Medical History    has a past medical history of Deviated nasal septum, Environmental allergies, GERD (gastroesophageal reflux disease), Hyperlipidemia, Hypertension, Perforation of colon (HCC) (2006), and Sinusitis, acute.   Significant Hospital Events: Including procedures, antibiotic start and stop dates in addition to other pertinent events   1/1 admitted with acute hypoxic respiratory failure in the setting of pneumonia and large right pleural effusion.  Right-sided thoracostomy tube placed in the emergency room fluid consistent with exudate post insertion chest x-ray worrisome for lung entrapment 1/2 oxygen requirements improved, pain controlled, lung aeration improved with decreased pneumothorax (really only minimal apical involvement now).  Added RVP, awaiting pleural culture and cytology, added Toradol  for pain, getting CT chest  Interim History / Subjective:  Feels better no distress  Objective   Blood pressure 106/71, pulse 94, temperature 97.7 F (36.5 C), temperature source Oral, resp. rate 20, height 5' 6 (1.676 m), weight 81.6 kg, SpO2 98%.        Intake/Output Summary (Last 24 hours) at  09/27/2023 0731 Last data filed at 09/27/2023 0400 Gross per 24 hour  Intake 1183.67 ml  Output 3975 ml  Net -2791.33 ml   Filed Weights   09/26/23 1021  Weight: 81.6 kg    Examination:  General This is a very pleasant 78 year old female patient she is lying in bed currently in no acute distress but does have some discomfort when moving, pleuritic discomforts improved, but she still has some chest discomfort at the insertion site HEENT normocephalic atraumatic her mucous membranes are moist there is no jugular venous distention Pulmonary scattered rhonchi, basilar rales right greater than left, currently on 1 L nasal cannula without accessory use.  Chest tube at 20 cm water suction, 1 out of 7 airleak which is a little more persistent than yesterday 1375 mL out from chest tube, since insertion PCXR aeration improved. Minimal apical PTX. Still has some R>L airspace disease w/ right basilar consolidation  Cardiac: Regular rate and rhythm without murmur rub or gallop Extremities warm and dry trace lower extremity edema brisk capillary refill Neuro awake oriented and without focal deficits.  Resolved Hospital Problem list     Assessment & Plan:   Acute hypoxic respiratory failure 2/2 exudative Right effusion complicated by post procedural trapped lung Exudate by Light's criteria  Plan Continue to wean supplemental oxygen Continue pulse oximetry Keeping chest tube to 20 cm water suction, have spoken with nursing staff okay to change to waterseal for ambulation and imaging but then return to suction Added scheduled low-dose Toradol  for pain Follow-up pleural culture and cytology A.m. chest x-ray to follow-up on effusion  Prob CAP Pct neg, ustrep neg, flu and covid neg, bnp unremarkable,  Plan Day 2 unasyn  and doxy F/u u legionella Send RVP  Needs CT non-contrast of chest to eval for lung mass  Fluid and electrolyte imbalance: Hyponatremia (hypo-osmolar hypovolemic),  hypokalemia Plan Per primary   Best Practice (right click and Reselect all SmartList Selections daily)   Diet/type: Regular consistency (see orders) DVT prophylaxis not indicated Pressure ulcer(s): N/A GI prophylaxis: N/A Lines: N/A Foley:  N/A Code Status:  full code Last date of multidisciplinary goals of care discussion []   Signature:

## 2023-09-27 NOTE — Evaluation (Signed)
 Physical Therapy Evaluation Patient Details Name: Amanda Potter MRN: [id number] DOB: 01-07-1946 Today's Date: 09/27/2023  History of Present Illness  Pt is 78 yo presenting to [facility] ED 1/1 with sinus infection which proceeded to respiratory tract infection with large R pleural effusion s/p chest tube placement with incomplete expansion of the lung. PMH: GERD, hyperlipidemia, HTN.  Clinical Impression  Pt presents close to baseline level of functioning. Pt was Mod I for sit to stand, short distance gait and bed mobility without an AD. Pt is ind at baseline. Pt treatment session was limited due to ECHO came into the room and needed to perform imaging. Due to pt current functional status, home set up and available assistance at home no recommended skilled physical therapy services at this time on discharge from acute care hospital setting. Will continue to follow in acute setting in order to ensure that pt returns home with decreased risk for falls, injury, re-hospitalization and improved activity tolerance.          If plan is discharge home, recommend the following: Assistance with cooking/housework;Assist for transportation     Equipment Recommendations None recommended by PT     Functional Status Assessment Patient has had a recent decline in their functional status and demonstrates the ability to make significant improvements in function in a reasonable and predictable amount of time.     Precautions / Restrictions Precautions Precautions: Fall Restrictions Weight Bearing Restrictions Per Provider Order: No      Mobility  Bed Mobility Overal bed mobility: Modified Independent     General bed mobility comments: increased time for sitting to supine with no use of rails and HOB slightly elevated.    Transfers Overall transfer level: Modified independent Equipment used: None     General transfer comment: no noted deficits with sit to stand. Pt moves slowly due to pain from chest  tube.    Ambulation/Gait Ambulation/Gait assistance: Modified independent (Device/Increase time) Gait Distance (Feet): 5 Feet Assistive device: None Gait Pattern/deviations: Step-through pattern, Decreased stride length Gait velocity: decreased Gait velocity interpretation: <1.31 ft/sec, indicative of household ambulator   General Gait Details: Decreased stride length. Steady steps from recliner to EOB without an AD.  Stairs Stairs:  (pt has a ramp)            Balance Overall balance assessment: No apparent balance deficits (not formally assessed)           Pertinent Vitals/Pain Pain Assessment Pain Assessment: 0-10 Pain Score: 3  Pain Location: 1 at rest and 3/10 with movement. Site of chest tube Pain Descriptors / Indicators: Aching Pain Intervention(s): Limited activity within patient's tolerance, Monitored during session    Home Living Family/patient expects to be discharged to:: Private residence Living Arrangements: Children;Other (Comment) (lives with son, has a friend who checks on her) Available Help at Discharge: Available 24 hours/day;Friend(s);Family (daughter and son live close by and youngest son lives with her. She has a friend who comes by during the day) Type of Home: House Home Access: Ramped entrance       Home Layout: One level Home Equipment: Shower seat;Grab bars - toilet;Cane - quad;Cane - single point;Lift chair      Prior Function Prior Level of Function : Independent/Modified Independent;Driving             Mobility Comments: Pt reports that she ambulates without an AD. ADLs Comments: Pt states that she was still driving. Performs all ADL's and IADL's including finances and medications.  Extremity/Trunk Assessment   Upper Extremity Assessment Upper Extremity Assessment: Overall WFL for tasks assessed    Lower Extremity Assessment Lower Extremity Assessment: Overall WFL for tasks assessed    Cervical / Trunk  Assessment Cervical / Trunk Assessment: Normal  Communication   Communication Communication: No apparent difficulties;Other (comment) (slight difficulty speaking due to respiratory status.)  Cognition Arousal: Alert Behavior During Therapy: WFL for tasks assessed/performed Overall Cognitive Status: Within Functional Limits for tasks assessed        General Comments General comments (skin integrity, edema, etc.): No noted skin issues outside of gown. No noted drainage from chest tube.        Assessment/Plan    PT Assessment Patient needs continued PT services  PT Problem List Decreased activity tolerance       PT Treatment Interventions DME instruction;Therapeutic activities;Patient/family education    PT Goals (Current goals can be found in the Care Plan section)  Acute Rehab PT Goals Patient Stated Goal: to return home. PT Goal Formulation: With patient Time For Goal Achievement: 10/11/23 Potential to Achieve Goals: Good    Frequency Min 1X/week        AM-PAC PT 6 Clicks Mobility  Outcome Measure Help needed turning from your back to your side while in a flat bed without using bedrails?: None Help needed moving from lying on your back to sitting on the side of a flat bed without using bedrails?: None Help needed moving to and from a bed to a chair (including a wheelchair)?: None Help needed standing up from a chair using your arms (e.g., wheelchair or bedside chair)?: None Help needed to walk in hospital room?: A Little Help needed climbing 3-5 steps with a railing? : A Little 6 Click Score: 22    End of Session   Activity Tolerance: Patient tolerated treatment well;Patient limited by fatigue Patient left: in bed;with call bell/phone within reach;Other (comment) (ECHO in room) Nurse Communication: Mobility status PT Visit Diagnosis: Other abnormalities of gait and mobility (R26.89)    Time: 9090-9067 PT Time Calculation (min) (ACUTE ONLY): 23  min   Charges:   PT Evaluation $PT Eval Low Complexity: 1 Low PT Treatments $Therapeutic Activity: 8-22 mins PT General Charges $$ ACUTE PT VISIT: 1 Visit         Dorothyann Maier, DPT, CLT  Acute Rehabilitation Services Office: [phone] (Secure chat preferred)   Dorothyann VEAR Maier 09/27/2023, 9:38 AM

## 2023-09-27 NOTE — Progress Notes (Signed)
 PCCM note  CT chest reviewed with moderate-sized right pneumothorax, airspace disease and consolidations in the right lung.  The effusion appears to be completely drained.  Replace chest tube to suction.  Continues to have an air leak which will need monitoring.  Repeat chest x-ray in the morning.  Ivah Girardot MD [facility] See Amion for pager  If no response to pager , please call [phone] until 7pm After 7:00 pm call Elink  [phone] 09/27/2023, 6:26 PM

## 2023-09-27 NOTE — Progress Notes (Addendum)
 Mobility Specialist Progress Note:   09/27/23 1147  Mobility  Activity Transferred from bed to chair  Level of Assistance Standby assist, set-up cues, supervision of patient - no hands on  Assistive Device None  Distance Ambulated (ft) 3 ft  Activity Response Tolerated well  Mobility Referral Yes  Mobility visit 1 Mobility  Mobility Specialist Start Time (ACUTE ONLY) 1130  Mobility Specialist Stop Time (ACUTE ONLY) 1140  Mobility Specialist Time Calculation (min) (ACUTE ONLY) 10 min   Pt received in bed, agreeable to mobility. Unable to ambulate d/t pt being on chest tube suction per RN request. No hands on assist needed to stand and pivot to chair from bedside. VSS. Pt left in chair with call bell in reach and all needs met. Chest tube left on suction.  Amanda Potter  Mobility Specialist Please contact via [employer] office at [phone]

## 2023-09-27 NOTE — Progress Notes (Signed)
 PROGRESS NOTE    Kasha Howeth  FMW:[id number] DOB: Mar 16, 1946 DOA: 09/26/2023 PCP: Debrah Josette ORN., PA-C    Brief Narrative:   Amanda Potter is a 78 y.o. female with past medical history significant for HTN, GERD, dyslipidemia, recurrent sinusitis, perforated colon following colonoscopy who presented to [facility] ED on 09/25/2022 progressive shortness of breath, cough, congestion over the past 3-4 weeks.  Initially was reporting left sided maxillary sinus fullness/redness and saw her PCP and was given a 10-day course of oral antibiotics and steroid taper.  She reports her symptoms improved however 7-8 days ago she started experiencing progressive shortness of breath, chest tightness.  Worse with minimal exertion and laying flat.  In the ED, temperature 97.7 F, HR 109, RR 26, BP 126/62, SpO2 94% on room air.  WBC 10.6, hemoglobin 13.0, platelet count 370.  Sodium 127, potassium 2.4, chloride 85, CO2 33, glucose 130, BUN 9, creatinine 0.60.  AST 12, ALT 7, total bilirubin 1.7.  BNP 16.4.  COVID/influenza/RSV PCR negative.  Chest x-ray with right base collapse/consolidation of large right pleural effusion, with concern of neoplasm given unilateral disease.  Chest tube was placed by ED physician at [facility].  PCCM consulted and patient was transferred to [facility] on the hospital service for further evaluation and management of large right-sided pleural effusion and community-acquired pneumonia.  Assessment & Plan:   Acute hypoxic respiratory failure Exudative right pleural effusion Community-acquired pneumonia Patient presents to ED with progressive shortness of breath.  Chest x-ray with large right-sided pleural effusion.  Chest tube placed by ED physician on 09/26/2023.  COVID/RSV/influenza PCR negative.  Respiratory viral panel negative.  MRSA PCR negative.  Pleural fluid analysis consistent with exudative effusion, possibly from underlying pneumonia but also  concern for malignancy. -- PCCM following, appreciate assistance -- Blood cultures x 2: Pending -- General Fluid culture: No growth less than 12 hours -- Cytology: Pending --Sputum culture: Ordered -- Chest tube output: 3.6L since placement -- Doxycycline  5 mg p.o. every 12 hours -- Unasyn  3 g IV every 6 hours -- Continue chest tube to suction; monitor output -- CXR in am  Hyponatremia Sodium 127 on admission.  Serum osmolality 274 (low), urine sodium less than 20, urine osmolality 148 (low).  TSH slightly low at 0.301. -- Na 127>129 -- Encourage increased oral intake -- BMP daily  Hypokalemia Potassium 2.4 on admission, repleted.  Repeat potassium 4.0 this morning. -- Monitor electrolytes daily  Hypochloremia Chloride 85 on admission, likely secondary to poor oral intake/dehydration -- Cl 85>93 -- BMP daily  Essential hypertension On lisinopril 10 mg p.o. daily outpatient.  Blood pressures have been borderline low. --TTE: Pending -- Continue to hold home lisinopril -- Monitor BP closely  Dyslipidemia -- Zocor  20 mg p.o. daily  GERD -- Protonix  20 mg p.o. daily   DVT prophylaxis: heparin  injection 5,000 Units Start: 09/26/23 2200    Code Status: Full Code Family Communication: No family present at bedside this morning  Disposition Plan:  Level of care: Progressive Status is: Inpatient Remains inpatient appropriate because: Chest tube, IV antibiotics, awaiting further recommendations from PCCM    Consultants:  PCCM  Procedures:  Chest tube placement by ED physician, 1/1  Antimicrobials:  Doxycycline  1/1>> Unasyn  1/1>>   Subjective: Patient seen examined bedside, resting,.  Sitting in bedside chair.  Dyspnea much improved.  Continues with chest tube in place.  Discussed with PCCM, Dr. Theophilus this morning.  Obtaining CT chest without contrast.  Patient with no  other specific complaints, questions, or concerns at this time.  Denies headache, no dizziness, no  chest pain, no fever/chills/night sweats, no nausea/vomiting/diarrhea, abdominal pain, no focal weakness, no fatigue, no paresthesia.  No acute events overnight per nursing staff.  Objective: Vitals:   09/27/23 1000 09/27/23 1002 09/27/23 1003 09/27/23 1212  BP:  106/69  (!) 98/55  Pulse:  (!) 104 (!) 101 95  Resp: 19 19 19 19   Temp:   98.5 F (36.9 C) 98 F (36.7 C)  TempSrc:    Oral  SpO2:  98% 99% 99%  Weight:      Height:        Intake/Output Summary (Last 24 hours) at 09/27/2023 1346 Last data filed at 09/27/2023 1000 Gross per 24 hour  Intake 1735.14 ml  Output 5085 ml  Net -3349.86 ml   Filed Weights   09/26/23 1021  Weight: 81.6 kg    Examination:  Physical Exam: GEN: NAD, alert and oriented x 3, elderly in appearance HEENT: NCAT, PERRL, EOMI, sclera clear, MMM PULM: Diminished breath sounds right midlung/base, normal respiratory effort without accessory muscle use, on 2 L nasal cannula with SpO2 98% at rest, chest tube noted to suction CV: RRR w/o M/G/R GI: abd soft, NTND, NABS, no R/G/M MSK: no peripheral edema, muscle strength globally intact 5/5 bilateral upper/lower extremities NEURO: CN II-XII intact, no focal deficits, sensation to light touch intact PSYCH: normal mood/affect Integumentary: dry/intact, no rashes or wounds    Data Reviewed: I have personally reviewed following labs and imaging studies  CBC: Recent Labs  Lab 09/26/23 1100 09/26/23 1138 09/26/23 1815 09/27/23 0313  WBC 10.6*  --  14.0* 10.4  NEUTROABS  --   --   --  8.7*  HGB 13.0 12.6 12.5 12.8  HCT 38.1 37.0 36.6 37.5  MCV 81.2  --  81.3 81.3  PLT 370  --  355 402*   Basic Metabolic Panel: Recent Labs  Lab 09/26/23 1100 09/26/23 1138 09/26/23 1815 09/26/23 2234 09/27/23 0313  NA 127* 127*  --   --  129*  K 2.4* 2.1*  --  4.2 4.0  CL 85*  --   --   --  93*  CO2 33*  --   --   --  24  GLUCOSE 130*  --   --   --  139*  BUN 9  --   --   --  <5*  CREATININE 0.60  --  0.65   --  0.64  CALCIUM 8.9  --   --   --  8.5*  MG  --   --   --   --  2.2  PHOS  --   --   --   --  3.0   GFR: Estimated Creatinine Clearance: 61.4 mL/min (by C-G formula based on SCr of 0.64 mg/dL). Liver Function Tests: Recent Labs  Lab 09/26/23 1100 09/27/23 0313  AST 12* 16  ALT 7 9  ALKPHOS 75 63  BILITOT 1.1 0.9  PROT 6.2* 5.9*  ALBUMIN 3.6 2.5*   No results for input(s): LIPASE, AMYLASE in the last 168 hours. No results for input(s): AMMONIA in the last 168 hours. Coagulation Profile: No results for input(s): INR, PROTIME in the last 168 hours. Cardiac Enzymes: No results for input(s): CKTOTAL, CKMB, CKMBINDEX, TROPONINI in the last 168 hours. BNP (last 3 results) No results for input(s): PROBNP in the last 8760 hours. HbA1C: No results for input(s): HGBA1C in the last 72  hours. CBG: No results for input(s): GLUCAP in the last 168 hours. Lipid Profile: No results for input(s): CHOL, HDL, LDLCALC, TRIG, CHOLHDL, LDLDIRECT in the last 72 hours. Thyroid  Function Tests: Recent Labs    09/26/23 1815 09/27/23 0313  TSH 0.301*  --   FREET4  --  1.47*   Anemia Panel: No results for input(s): VITAMINB12, FOLATE, FERRITIN, TIBC, IRON, RETICCTPCT in the last 72 hours. Sepsis Labs: Recent Labs  Lab 09/26/23 1817  PROCALCITON <0.10    Recent Results (from the past 240 hours)  Resp panel by RT-PCR (RSV, Flu A&B, Covid) Anterior Nasal Swab     Status: None   Collection Time: 09/26/23 11:39 AM   Specimen: Anterior Nasal Swab  Result Value Ref Range Status   SARS Coronavirus 2 by RT PCR NEGATIVE NEGATIVE Final    Comment: (NOTE) SARS-CoV-2 target nucleic acids are NOT DETECTED.  The SARS-CoV-2 RNA is generally detectable in upper respiratory specimens during the acute phase of infection. The lowest concentration of SARS-CoV-2 viral copies this assay can detect is 138 copies/mL. A negative result does not preclude  SARS-Cov-2 infection and should not be used as the sole basis for treatment or other patient management decisions. A negative result may occur with  improper specimen collection/handling, submission of specimen other than nasopharyngeal swab, presence of viral mutation(s) within the areas targeted by this assay, and inadequate number of viral copies(<138 copies/mL). A negative result must be combined with clinical observations, patient history, and epidemiological information. The expected result is Negative.  Fact Sheet for Patients:  bloggercourse.com  Fact Sheet for Healthcare Providers:  seriousbroker.it  This test is no t yet approved or cleared by the [place]  FDA and  has been authorized for detection and/or diagnosis of SARS-CoV-2 by FDA under an Emergency Use Authorization (EUA). This EUA will remain  in effect (meaning this test can be used) for the duration of the COVID-19 declaration under Section 564(b)(1) of the Act, 21 U.S.C.section 360bbb-3(b)(1), unless the authorization is terminated  or revoked sooner.       Influenza A by PCR NEGATIVE NEGATIVE Final   Influenza B by PCR NEGATIVE NEGATIVE Final    Comment: (NOTE) The Xpert Xpress SARS-CoV-2/FLU/RSV plus assay is intended as an aid in the diagnosis of influenza from Nasopharyngeal swab specimens and should not be used as a sole basis for treatment. Nasal washings and aspirates are unacceptable for Xpert Xpress SARS-CoV-2/FLU/RSV testing.  Fact Sheet for Patients: bloggercourse.com  Fact Sheet for Healthcare Providers: seriousbroker.it  This test is not yet approved or cleared by the [place]  FDA and has been authorized for detection and/or diagnosis of SARS-CoV-2 by FDA under an Emergency Use Authorization (EUA). This EUA will remain in effect (meaning this test can be used) for the duration of  the COVID-19 declaration under Section 564(b)(1) of the Act, 21 U.S.C. section 360bbb-3(b)(1), unless the authorization is terminated or revoked.     Resp Syncytial Virus by PCR NEGATIVE NEGATIVE Final    Comment: (NOTE) Fact Sheet for Patients: bloggercourse.com  Fact Sheet for Healthcare Providers: seriousbroker.it  This test is not yet approved or cleared by the [place]  FDA and has been authorized for detection and/or diagnosis of SARS-CoV-2 by FDA under an Emergency Use Authorization (EUA). This EUA will remain in effect (meaning this test can be used) for the duration of the COVID-19 declaration under Section 564(b)(1) of the Act, 21 U.S.C. section 360bbb-3(b)(1), unless the authorization is terminated or revoked.  Performed  at [facility], [address], [place], [place] [address]   Body fluid culture w Gram Stain     Status: None (Preliminary result)   Collection Time: 09/26/23  1:22 PM   Specimen: Pleural Fluid  Result Value Ref Range Status   Specimen Description   Final    PLEURAL RIGHT Performed at [facility], [address], [place], [place] [address]    Special Requests   Final    NONE Performed at [facility], [address] [place], [place] [address]    Gram Stain PENDING  Incomplete   Culture   Final    NO GROWTH < 12 HOURS Performed at [facility], [address], [place], [place] [address]    Report Status PENDING  Incomplete  MRSA Next Gen by PCR, Nasal     Status: None   Collection Time: 09/26/23  7:00 PM  Result Value Ref Range Status   MRSA by PCR Next Gen NOT DETECTED NOT DETECTED Final    Comment: (NOTE) The GeneXpert MRSA Assay (FDA approved for NASAL specimens only), is one component of a comprehensive MRSA colonization surveillance program. It is not intended to diagnose MRSA infection nor to guide or monitor treatment for MRSA  infections. Test performance is not FDA approved in patients less than 75 years old. Performed at [facility], [address], [place], [place] [address]   Culture, blood (Routine X 2) w Reflex to ID Panel     Status: None (Preliminary result)   Collection Time: 09/26/23  7:27 PM   Specimen: BLOOD RIGHT HAND  Result Value Ref Range Status   Specimen Description BLOOD RIGHT HAND  Final   Special Requests   Final    BOTTLES DRAWN AEROBIC AND ANAEROBIC Blood Culture results may not be optimal due to an inadequate volume of blood received in culture bottles   Culture   Final    NO GROWTH < 12 HOURS Performed at [facility], [address], [place], [place] [address]    Report Status PENDING  Incomplete  Culture, blood (Routine X 2) w Reflex to ID Panel     Status: None (Preliminary result)   Collection Time: 09/26/23  7:46 PM   Specimen: BLOOD LEFT ARM  Result Value Ref Range Status   Specimen Description BLOOD LEFT ARM  Final   Special Requests   Final    BOTTLES DRAWN AEROBIC AND ANAEROBIC Blood Culture results may not be optimal due to an inadequate volume of blood received in culture bottles   Culture   Final    NO GROWTH < 12 HOURS Performed at [facility], [address] [place], [place] [address]    Report Status PENDING  Incomplete  Respiratory (~20 pathogens) panel by PCR     Status: None   Collection Time: 09/27/23  8:59 AM   Specimen: Nasopharyngeal Swab; Respiratory  Result Value Ref Range Status   Adenovirus NOT DETECTED NOT DETECTED Final   Coronavirus 229E NOT DETECTED NOT DETECTED Final    Comment: (NOTE) The Coronavirus on the Respiratory Panel, DOES NOT test for the novel  Coronavirus (2019 nCoV)    Coronavirus HKU1 NOT DETECTED NOT DETECTED Final   Coronavirus NL63 NOT DETECTED NOT DETECTED Final   Coronavirus OC43 NOT DETECTED NOT DETECTED Final   Metapneumovirus NOT DETECTED NOT DETECTED Final   Rhinovirus / Enterovirus NOT DETECTED  NOT DETECTED Final   Influenza  A NOT DETECTED NOT DETECTED Final   Influenza B NOT DETECTED NOT DETECTED Final   Parainfluenza Virus 1 NOT DETECTED NOT DETECTED Final   Parainfluenza Virus 2 NOT DETECTED NOT DETECTED Final   Parainfluenza Virus 3 NOT DETECTED NOT DETECTED Final   Parainfluenza Virus 4 NOT DETECTED NOT DETECTED Final   Respiratory Syncytial Virus NOT DETECTED NOT DETECTED Final   Bordetella pertussis NOT DETECTED NOT DETECTED Final   Bordetella Parapertussis NOT DETECTED NOT DETECTED Final   Chlamydophila pneumoniae NOT DETECTED NOT DETECTED Final   Mycoplasma pneumoniae NOT DETECTED NOT DETECTED Final    Comment: Performed at [facility], [address] [place], [place] [address]         Radiology Studies: CT CHEST WO CONTRAST Result Date: 09/27/2023 CLINICAL DATA:  Pneumonia. EXAM: CT CHEST WITHOUT CONTRAST TECHNIQUE: Multidetector CT imaging of the chest was performed following the standard protocol without IV contrast. RADIATION DOSE REDUCTION: This exam was performed according to the departmental dose-optimization program which includes automated exposure control, adjustment of the mA and/or kV according to patient size and/or use of iterative reconstruction technique. COMPARISON:  Radiograph of same day. FINDINGS: Cardiovascular: Atherosclerosis of thoracic aorta is noted without aneurysm formation. Normal cardiac size. No pericardial effusion. Coronary artery calcifications are noted. Mediastinum/Nodes: Thyroid  gland is unremarkable. No adenopathy. Moderate size hiatal hernia. Lungs/Pleura: Right-sided chest tube is noted. Moderate size right apical and basilar pneumothorax is noted. Minimal right pleural effusion is noted as well. Airspace opacities are noted in right lung concerning for pneumonia or possibly atelectasis. Bronchiectasis is noted in left lower lobe. Upper Abdomen: No acute abnormality. Musculoskeletal: No chest wall mass or suspicious bone lesions  identified. IMPRESSION: Right-sided chest tube is noted with moderate size right apical and basilar pneumothorax with minimal right pleural effusion. Airspace opacities are noted in the right lung concerning for pneumonia or possibly atelectasis. Bronchiectasis is noted in left lower lobe. Moderate size hiatal hernia. Coronary artery calcifications are noted. Aortic Atherosclerosis (ICD10-I70.0). Electronically Signed   By: Lynwood Landy Raddle M.D.   On: 09/27/2023 13:05   DG CHEST PORT 1 VIEW Result Date: 09/27/2023 CLINICAL DATA:  Right-sided chest tube. EXAM: PORTABLE CHEST 1 VIEW COMPARISON:  September 26, 2023. FINDINGS: Stable cardiomediastinal silhouette. Right-sided chest tube is noted with minimal right apical pneumothorax which is improved compared to prior exam, a continued mild right basilar pneumothorax with associated effusion. IMPRESSION: Stable right-sided chest tube with improved left apical pneumothorax, but continued right basilar hydropneumothorax. Electronically Signed   By: Lynwood Landy Raddle M.D.   On: 09/27/2023 09:47   DG Chest Port 1 View Result Date: 09/26/2023 CLINICAL DATA:  [id number] Chest tube in place [id number] EXAM: PORTABLE CHEST 1 VIEW COMPARISON:  Chest radiograph from earlier today. FINDINGS: Stable cardiomediastinal silhouette with normal heart size and moderate hiatal hernia. Right basilar pigtail pleural catheter in place. Persistent moderate right hydropneumothorax with substantially decreased right pleural effusion component and with new moderate ex vacuo right pneumothorax component. No mediastinal shift. No left pneumothorax. No left pleural effusion. Improved aeration of the right lung with persistent mild to moderate patchy bibasilar lung opacities. No overt pulmonary edema. IMPRESSION: 1. Persistent moderate right hydropneumothorax status post right basilar pigtail pleural catheter placement, with substantially decreased right pleural effusion component and with new moderate ex  vacuo right pneumothorax component. No mediastinal shift. 2. Improved aeration of the right lung with persistent mild to moderate patchy bibasilar lung opacities. 3. Chronic moderate hiatal hernia. Electronically  Signed   By: Selinda DELENA Blue M.D.   On: 09/26/2023 14:38   DG Chest Port 1 View Result Date: 09/26/2023 CLINICAL DATA:  Shortness of breath. EXAM: PORTABLE CHEST 1 VIEW COMPARISON:  12/10/2017 FINDINGS: Right base collapse/consolidation with large right pleural effusion. Left lung is hyperexpanded but appears clear. Moderate to large hiatal hernia. The cardio pericardial silhouette is enlarged. Bones are diffusely demineralized. Telemetry leads overlie the chest. IMPRESSION: 1. Right base collapse/consolidation with large right pleural effusion. Given the unilateral disease, neoplasm is a concern. Chest CT (with contrast if renal function permits) recommended to further evaluate. 2. Moderate to large hiatal hernia. Electronically Signed   By: Camellia Candle M.D.   On: 09/26/2023 11:50        Scheduled Meds:  aspirin  EC  81 mg Oral Q1200   doxycycline   100 mg Oral Q12H   heparin   5,000 Units Subcutaneous Q8H   ketorolac   15 mg Intravenous Q6H   pantoprazole   40 mg Oral Daily   simvastatin   20 mg Oral QHS   Continuous Infusions:  sodium chloride  75 mL/hr at 09/27/23 1000   ampicillin -sulbactam (UNASYN ) IV 3 g (09/27/23 1204)     LOS: 0 days    Time spent: 53 minutes spent on chart review, discussion with nursing staff, consultants, updating family and interview/physical exam; more than 50% of that time was spent in counseling and/or coordination of care.    Camellia PARAS Barbara Ahart, DO [facility] Available via Epic secure chat 7am-7pm After these hours, please refer to coverage provider listed on [email or web address] 09/27/2023, 1:46 PM

## 2023-09-27 NOTE — Plan of Care (Signed)

## 2023-09-28 ENCOUNTER — Inpatient Hospital Stay (HOSPITAL_COMMUNITY): Payer: Medicare Other

## 2023-09-28 DIAGNOSIS — E871 Hypo-osmolality and hyponatremia: Secondary | ICD-10-CM | POA: Diagnosis not present

## 2023-09-28 DIAGNOSIS — J9601 Acute respiratory failure with hypoxia: Secondary | ICD-10-CM | POA: Diagnosis not present

## 2023-09-28 DIAGNOSIS — J90 Pleural effusion, not elsewhere classified: Secondary | ICD-10-CM | POA: Diagnosis not present

## 2023-09-28 LAB — COMPREHENSIVE METABOLIC PANEL
ALT: 10 U/L (ref 0–44)
AST: 14 U/L — ABNORMAL LOW (ref 15–41)
Albumin: 2.2 g/dL — ABNORMAL LOW (ref 3.5–5.0)
Alkaline Phosphatase: 48 U/L (ref 38–126)
Anion gap: 7 (ref 5–15)
BUN: 13 mg/dL (ref 8–23)
CO2: 30 mmol/L (ref 22–32)
Calcium: 8.3 mg/dL — ABNORMAL LOW (ref 8.9–10.3)
Chloride: 99 mmol/L (ref 98–111)
Creatinine, Ser: 0.71 mg/dL (ref 0.44–1.00)
GFR, Estimated: >60 mL/min (ref >60)
Glucose, Bld: 95 mg/dL (ref 70–99)
Potassium: 3.5 mmol/L (ref 3.5–5.1)
Sodium: 136 mmol/L (ref 135–145)
Total Bilirubin: 0.4 mg/dL (ref 0.0–1.2)
Total Protein: 5.1 g/dL — ABNORMAL LOW (ref 6.5–8.1)

## 2023-09-28 LAB — CBC WITH DIFFERENTIAL/PLATELET
Abs Immature Granulocytes: 0.04 10*3/uL (ref 0.00–0.07)
Basophils Absolute: 0 10*3/uL (ref 0.0–0.1)
Basophils Relative: 0 %
Eosinophils Absolute: 0.1 10*3/uL (ref 0.0–0.5)
Eosinophils Relative: 1 %
HCT: 33 % — ABNORMAL LOW (ref 36.0–46.0)
Hemoglobin: 10.6 g/dL — ABNORMAL LOW (ref 12.0–15.0)
Immature Granulocytes: 0 %
Lymphocytes Relative: 31 %
Lymphs Abs: 3.1 10*3/uL (ref 0.7–4.0)
MCH: 27.7 pg (ref 26.0–34.0)
MCHC: 32.1 g/dL (ref 30.0–36.0)
MCV: 86.4 fL (ref 80.0–100.0)
Monocytes Absolute: 0.8 10*3/uL (ref 0.1–1.0)
Monocytes Relative: 8 %
Neutro Abs: 6.1 10*3/uL (ref 1.7–7.7)
Neutrophils Relative %: 60 %
Platelets: 304 10*3/uL (ref 150–400)
RBC: 3.82 MIL/uL — ABNORMAL LOW (ref 3.87–5.11)
RDW: 14 % (ref 11.5–15.5)
WBC: 10.2 10*3/uL (ref 4.0–10.5)
nRBC: 0 % (ref 0.0–0.2)

## 2023-09-28 LAB — PHOSPHORUS: Phosphorus: 3.2 mg/dL (ref 2.5–4.6)

## 2023-09-28 LAB — BRAIN NATRIURETIC PEPTIDE: B Natriuretic Peptide: 64.7 pg/mL (ref 0.0–100.0)

## 2023-09-28 LAB — MAGNESIUM: Magnesium: 2 mg/dL (ref 1.7–2.4)

## 2023-09-28 MED ORDER — AMOXICILLIN-POT CLAVULANATE 875-125 MG PO TABS
1.0000 | ORAL_TABLET | Freq: Two times a day (BID) | ORAL | Status: AC
  Administered 2023-09-28 – 2023-10-01 (×6): 1 via ORAL
  Filled 2023-09-28 (×6): qty 1

## 2023-09-28 NOTE — Plan of Care (Signed)
  Problem: Pain Management: Goal: General experience of comfort will improve Outcome: Progressing

## 2023-09-28 NOTE — Progress Notes (Signed)
  Progress Note   Patient: Amanda Potter FMW:[id number] DOB: 01-08-1946 DOA: 09/26/2023     1 DOS: the patient was seen and examined on 09/28/2023 at 2:25PM      Brief hospital course: 78 y.o. female with past medical history significant for HTN, GERD, dyslipidemia, recurrent sinusitis, perforated colon following colonoscopy who presented with pneumonia and right-sided pleural effusion.     Assessment and Plan: Acute respiratory failure with hypoxia and Community-acquired pneumonia Right pleural effusion Respiratory virus panel negative Body fluid culture from pleural fluid no growth yet, cytology pending -Maintain chest tube to suction - Continue antibiotics, narrowed to Augmentin , doxycycline   Subclinical hyperthyroidism TSH low, free T4 elevated. Asymptomatic. - Check T3  Hyponatremia Sodium resolved and normal  Hypertension Blood pressure controlled - Hold lisinopril  Hyperlipidemia -Continue simvastatin   Hypokalemia Supplemented and resolved        Subjective: Patient has some chest pain when she moves around or coughs.  Otherwise she is doing well.  No fever, confusion     Physical Exam: BP 117/73 (BP Location: Right Arm)   Pulse 94   Temp 98 F (36.7 C) (Oral)   Resp 20   Ht 5' 6 (1.676 m)   Wt 81.6 kg   SpO2 91%   BMI 29.04 kg/m   Elderly adult female, lying in bed, interactive and appropriate RRR, no murmurs, no peripheral edema Respiratory rate normal, lungs clear, no airway sounds normal on the left, diminished on the right, chest tube to suction Attention normal, affect appropriate, judgment and insight appear normal    Data Reviewed: Basic metabolic panel shows normal electrolytes and renal function, low albumin BNP normal Magnesium  normal Hemoglobin down to 10, white count normal    Family Communication: None present    Disposition: Status is: Inpatient         Author: Lonni SHAUNNA Dalton, MD 09/28/2023 4:35  PM  For on call review [email or web address].

## 2023-09-28 NOTE — Progress Notes (Signed)
 Mobility Specialist Progress Note:   09/28/23 1037  Mobility  Activity Ambulated with assistance in room  Level of Assistance Standby assist, set-up cues, supervision of patient - no hands on  Assistive Device Front wheel walker  Distance Ambulated (ft) 30 ft  Activity Response Tolerated well  Mobility Referral Yes  Mobility visit 1 Mobility  Mobility Specialist Start Time (ACUTE ONLY) 1000  Mobility Specialist Stop Time (ACUTE ONLY) 1020  Mobility Specialist Time Calculation (min) (ACUTE ONLY) 20 min   Pt received in bed, agreeable to mobility. Chest tube still on suction limiting gait distance but pt was able to ambulate short distance in room without fault with SB assist. Ambulated on RA. SpO2 in low 90's. Pt denied any SOB during session. C/o soreness form chest tube insertion, otherwise asx throughout. Pt left in chair with call bell in reach and all needs met.   Brown Husband  Mobility Specialist Please contact via [employer] office at [phone]

## 2023-09-28 NOTE — Hospital Course (Signed)
 78 y.o. female with past medical history significant for HTN, GERD, dyslipidemia, recurrent sinusitis, perforated colon following colonoscopy who presented with pneumonia and right-sided pleural effusion.

## 2023-09-28 NOTE — Progress Notes (Signed)
 PHARMACY - PHYSICIAN COMMUNICATION CRITICAL VALUE ALERT - BLOOD CULTURE IDENTIFICATION (BCID)  Amanda Potter is an 78 y.o. female who presented to [facility] on 09/26/2023 with a chief complaint of pneumonia and right sided pleural effusion.   Assessment:  1/4 blood cultures with gram stain showing gram positive rods  Name of physician (or Provider) Contacted: Dr. Franky  Current antibiotics: doxycycline  100mg  PO q12 and augmentin  875-125mg  PO q12  Changes to prescribed antibiotics recommended:   -Most like contaminate, continue to monitor patient on current treatment  No results found for this or any previous visit.  [facility] 09/28/2023  11:12 PM

## 2023-09-28 NOTE — Progress Notes (Signed)
   NAME:  Amanda Potter, MRN:  [id number], DOB:  1946/07/29, LOS: 1 ADMISSION DATE:  09/26/2023, CONSULTATION DATE: 09/26/2023 REFERRING MD: Rankin River, MD, CHIEF COMPLAINT: Pneumonia, pleural effusion  History of Present Illness:   78 year old with history of GERD, hyperlipidemia, hypertension, sinusitis presenting with progressive cough, shortness of breath.  Symptoms started about 3 weeks ago with a sinus infection which proceeded to cough and dyspnea.  Was given doxycycline , Decadron  and prednisone taper and albuterol  inhaler by primary care as an outpatient.  Complains of worsening dyspnea on Mikisha Roseland exertion, inability to lay flat.  Found to have a large right effusion on chest x-ray.  Chest tube placed by emergency room and transferred to [facility] for further management  Pertinent  Medical History    has a past medical history of Deviated nasal septum, Environmental allergies, GERD (gastroesophageal reflux disease), Hyperlipidemia, Hypertension, Perforation of colon (HCC) (2006), and Sinusitis, acute.   Significant Hospital Events: Including procedures, antibiotic start and stop dates in addition to other pertinent events   1/1 admitted with acute hypoxic respiratory failure in the setting of pneumonia and large right pleural effusion.  Right-sided thoracostomy tube placed in the emergency room fluid consistent with exudate post insertion chest x-ray worrisome for lung entrapment 1/2 oxygen requirements improved, pain controlled, lung aeration improved with decreased pneumothorax (really only minimal apical involvement now).  Added RVP, awaiting pleural culture and cytology, added Toradol  for pain, getting CT chest  Interim History / Subjective:  Reports feeling better  Objective   Blood pressure (!) 113/56, pulse 94, temperature 97.7 F (36.5 C), temperature source Oral, resp. rate 20, height 5' 6 (1.676 m), weight 81.6 kg, SpO2 96%.        Intake/Output Summary (Last 24 hours) at  09/28/2023 1207 Last data filed at 09/28/2023 9182 Gross per 24 hour  Intake 1323.8 ml  Output 1900 ml  Net -576.2 ml   Filed Weights   09/26/23 1021  Weight: 81.6 kg    Examination:  Awake alert no acute distress Chest tube with 220 cc drainage Creased breath sounds in the bases Heart sounds are regular Abdomen soft nontender Remedies warm and dry Resolved Hospital Problem list     Assessment & Plan:   Acute hypoxic respiratory failure 2/2 exudative Right effusion complicated by post procedural trapped lung Exudate by Light's criteria  Plan Wean O2 as tolerated Chest tube with 665 cc of drainage but slowing Will mobilize as able Chest x-ray pending     Prob CAP Pct neg, ustrep neg, flu and covid neg, bnp unremarkable, rvp neg Plan Chest CT without any mass   Fluid and electrolyte imbalance: Hyponatremia (hypo-osmolar hypovolemic), hypokalemia Plan Per primary   Best Practice (right click and Reselect all SmartList Selections daily)   Diet/type: Regular consistency (see orders) DVT prophylaxis not indicated Pressure ulcer(s): N/A GI prophylaxis: N/A Lines: N/A Foley:  N/A Code Status:  full code Last date of multidisciplinary goals of care discussion []   Signature:   Marcey Carlina Derks ACNP Acute Care Nurse Practitioner [facility] Please consult Amion 09/28/2023, 12:08 PM

## 2023-09-29 ENCOUNTER — Inpatient Hospital Stay (HOSPITAL_COMMUNITY): Payer: Medicare Other

## 2023-09-29 DIAGNOSIS — E871 Hypo-osmolality and hyponatremia: Secondary | ICD-10-CM | POA: Diagnosis not present

## 2023-09-29 DIAGNOSIS — J9601 Acute respiratory failure with hypoxia: Secondary | ICD-10-CM | POA: Diagnosis not present

## 2023-09-29 DIAGNOSIS — J90 Pleural effusion, not elsewhere classified: Secondary | ICD-10-CM | POA: Diagnosis not present

## 2023-09-29 LAB — COMPREHENSIVE METABOLIC PANEL
ALT: 11 U/L (ref 0–44)
AST: 13 U/L — ABNORMAL LOW (ref 15–41)
Albumin: 2.2 g/dL — ABNORMAL LOW (ref 3.5–5.0)
Alkaline Phosphatase: 50 U/L (ref 38–126)
Anion gap: 9 (ref 5–15)
BUN: 9 mg/dL (ref 8–23)
CO2: 29 mmol/L (ref 22–32)
Calcium: 8.3 mg/dL — ABNORMAL LOW (ref 8.9–10.3)
Chloride: 98 mmol/L (ref 98–111)
Creatinine, Ser: 0.56 mg/dL (ref 0.44–1.00)
GFR, Estimated: >60 mL/min (ref >60)
Glucose, Bld: 95 mg/dL (ref 70–99)
Potassium: 3.4 mmol/L — ABNORMAL LOW (ref 3.5–5.1)
Sodium: 136 mmol/L (ref 135–145)
Total Bilirubin: 0.8 mg/dL (ref 0.0–1.2)
Total Protein: 5.1 g/dL — ABNORMAL LOW (ref 6.5–8.1)

## 2023-09-29 LAB — CBC WITH DIFFERENTIAL/PLATELET
Abs Immature Granulocytes: 0.02 10*3/uL (ref 0.00–0.07)
Basophils Absolute: 0 10*3/uL (ref 0.0–0.1)
Basophils Relative: 0 %
Eosinophils Absolute: 0.2 10*3/uL (ref 0.0–0.5)
Eosinophils Relative: 2 %
HCT: 32.8 % — ABNORMAL LOW (ref 36.0–46.0)
Hemoglobin: 10.9 g/dL — ABNORMAL LOW (ref 12.0–15.0)
Immature Granulocytes: 0 %
Lymphocytes Relative: 31 %
Lymphs Abs: 2.5 10*3/uL (ref 0.7–4.0)
MCH: 28.2 pg (ref 26.0–34.0)
MCHC: 33.2 g/dL (ref 30.0–36.0)
MCV: 84.8 fL (ref 80.0–100.0)
Monocytes Absolute: 0.6 10*3/uL (ref 0.1–1.0)
Monocytes Relative: 8 %
Neutro Abs: 4.6 10*3/uL (ref 1.7–7.7)
Neutrophils Relative %: 59 %
Platelets: 289 10*3/uL (ref 150–400)
RBC: 3.87 MIL/uL (ref 3.87–5.11)
RDW: 13.9 % (ref 11.5–15.5)
WBC: 8 10*3/uL (ref 4.0–10.5)
nRBC: 0 % (ref 0.0–0.2)

## 2023-09-29 LAB — LEGIONELLA PNEUMOPHILA SEROGP 1 UR AG: L. pneumophila Serogp 1 Ur Ag: NEGATIVE

## 2023-09-29 LAB — BRAIN NATRIURETIC PEPTIDE: B Natriuretic Peptide: 64.1 pg/mL (ref 0.0–100.0)

## 2023-09-29 LAB — PHOSPHORUS: Phosphorus: 3.3 mg/dL (ref 2.5–4.6)

## 2023-09-29 LAB — MAGNESIUM: Magnesium: 1.7 mg/dL (ref 1.7–2.4)

## 2023-09-29 MED ORDER — POTASSIUM CHLORIDE CRYS ER 20 MEQ PO TBCR
40.0000 meq | EXTENDED_RELEASE_TABLET | Freq: Once | ORAL | Status: AC
Start: 2023-09-29 — End: 2023-09-29
  Administered 2023-09-29: 40 meq via ORAL
  Filled 2023-09-29: qty 2

## 2023-09-29 MED ORDER — IPRATROPIUM-ALBUTEROL 0.5-2.5 (3) MG/3ML IN SOLN
3.0000 mL | Freq: Four times a day (QID) | RESPIRATORY_TRACT | Status: DC
  Administered 2023-09-29 – 2023-09-30 (×3): 3 mL via RESPIRATORY_TRACT
  Filled 2023-09-29 (×3): qty 3

## 2023-09-29 NOTE — Plan of Care (Signed)
   Problem: Education: Goal: Knowledge of General Education information will improve Description Including pain rating scale, medication(s)/side effects and non-pharmacologic comfort measures Outcome: Progressing

## 2023-09-29 NOTE — Progress Notes (Signed)
  Progress Note   Patient: Amanda Potter FMW:[id number] DOB: [birth year] DOA: 09/26/2023     2 DOS: the patient was seen and examined on 09/29/2023 at 9:28 AM      Brief hospital course: 78 y.o. female with past medical history significant for HTN, GERD, dyslipidemia, recurrent sinusitis, perforated colon following colonoscopy who presented with pneumonia and right-sided pleural effusion.     Assessment and Plan:  Acute respiratory failure with hypoxia and hypercapnia due to community-acquired pneumonia and right pleural effusion - Continue Augmentin , doxycycline  - Maintain chest tube to suction -Consult pulmonology, appreciate cares -Hold aspirin   Subclinical hyperthyroidism Asymptoamtic - Follow T3 - Plan for methimazole  and TRAbs  Hypertension Blood pressure controlled - Hold amlodipine  and lisinopril  Hyperlipidemia - Continue simvastatin   Iron deficiency anemia - Hold ferrous sulfate  Hyponatremia Resolved  Hypokalemia Supplemented and resolved       Subjective: Feeling okay, no confusion, dyspnea, fever.  Some cough.  Pain with deep breath.     Physical Exam: BP 116/74 (BP Location: Left Arm)   Pulse 90   Temp 97.7 F (36.5 C) (Oral)   Resp 20   Ht 5' 6 (1.676 m)   Wt 81.6 kg   SpO2 98%   BMI 29.04 kg/m   Elderly adult female, lying in bed, interactive and appropriate RRR, no murmurs, no peripheral edema Respiratory normal, lungs clear without rales or wheezes no obvious goiter, no nodularity on palpation of the thyroid  Attention normal, affect normal, judgment and insight appear normal    Data Reviewed: Free T4 elevated Brain atretic peptide normal Magnesium  and phosphorus normal Potassium 3.4 Hemoglobin stable    Family Communication: None present    Disposition: Status is: Inpatient         Author: Lonni SHAUNNA Dalton, MD 09/29/2023 2:10 PM  For on call review [email or web address].

## 2023-09-29 NOTE — Progress Notes (Signed)
 NAME:  Amanda Potter, MRN:  [id number], DOB:  06-14-1946, LOS: 2 ADMISSION DATE:  09/26/2023, CONSULTATION DATE: 09/26/2023 REFERRING MD: Rankin River, MD, CHIEF COMPLAINT: Pneumonia, pleural effusion  History of Present Illness:   78 year old with history of GERD, hyperlipidemia, hypertension, sinusitis presenting with progressive cough, shortness of breath.  Symptoms started about 3 weeks ago with a sinus infection which proceeded to cough and dyspnea.  Was given doxycycline , Decadron  and prednisone taper and albuterol  inhaler by primary care as an outpatient.  Complains of worsening dyspnea on minor exertion, inability to lay flat.  Found to have a large right effusion on chest x-ray.  Chest tube placed by emergency room and transferred to [facility] for further management  Pertinent  Medical History    has a past medical history of Deviated nasal septum, Environmental allergies, GERD (gastroesophageal reflux disease), Hyperlipidemia, Hypertension, Perforation of colon (HCC) (2006), and Sinusitis, acute.   Significant Hospital Events: Including procedures, antibiotic start and stop dates in addition to other pertinent events   1/1 admitted with acute hypoxic respiratory failure in the setting of pneumonia and large right pleural effusion.  Right-sided thoracostomy tube placed in the emergency room fluid consistent with exudate post insertion chest x-ray worrisome for lung entrapment 1/2 oxygen requirements improved, pain controlled, lung aeration improved with decreased pneumothorax (really only minimal apical involvement now).  Added RVP, awaiting pleural culture and cytology, added Toradol  for pain, getting CT chest 1/4 No issues overnight, in current drainage system, low air leak (level 1) on am assessment    Interim History / Subjective:  States she feels well this am apart from intermittent chest discomfort at chest tube insertion site.   Objective   Blood pressure 125/60, pulse 95,  temperature (!) 97.5 F (36.4 C), temperature source Oral, resp. rate 14, height 5' 6 (1.676 m), weight 81.6 kg, SpO2 98%.        Intake/Output Summary (Last 24 hours) at 09/29/2023 0905 Last data filed at 09/29/2023 9342 Gross per 24 hour  Intake 120 ml  Output 1100 ml  Net -980 ml   Filed Weights   09/26/23 1021  Weight: 81.6 kg    Examination: General: Well appearing adult female sitting up in bed in NAD HEENT: [place]/AT, MM pink/moist, PERRL,  Neuro: Alert and oriented x3, non-focal  CV: s1s2 regular rate and rhythm, no murmur, rubs, or gallops,  PULM:  Clear to auscultation, faint wheeze to right posterior lung, chest tube in place GI: soft, bowel sounds active in all 4 quadrants, non-tender, non-distended, tolerating oral diet Extremities: warm/dry, no edema  Skin: no rashes or lesions  Resolved Hospital Problem list     Assessment & Plan:   Acute hypoxic respiratory failure 2/2 exudative Right effusion complicated by post procedural trapped lung -Exudate by Light's criteria suspect parapneumonic effusion  -Chest CT without any mass Possible CAP -Pct neg, ustrep neg, flu and covid neg, bnp unremarkable, rvp neg P: Continue chest tube as low air leak persist (level 1)  Follow chest tube output  Daily CXR while chest tube is in place  Follow cytology  Mobilize as able  Ensure adequate pain control    PCCM will continue to follow   Best Practice (right click and Reselect all SmartList Selections daily)   Diet/type: Regular consistency (see orders) DVT prophylaxis not indicated Pressure ulcer(s): N/A GI prophylaxis: N/A Lines: N/A Foley:  N/A Code Status:  full code Last date of multidisciplinary goals of care discussion []   Signature:  Echo Propp D. Harris, NP-C [facility] Personal contact information can be found on Amion  If no contact or response made please call [phone] 09/29/2023, 9:44 AM

## 2023-09-29 NOTE — Progress Notes (Signed)
 Mobility Specialist Progress Note:    09/29/23 1200  Mobility  Activity Ambulated with assistance in room  Level of Assistance Contact guard assist, steadying assist  Assistive Device Front wheel walker  Distance Ambulated (ft) 60 ft (10' x6)  Activity Response Tolerated well  Mobility Referral Yes  Mobility visit 1 Mobility  Mobility Specialist Start Time (ACUTE ONLY) 1153  Mobility Specialist Stop Time (ACUTE ONLY) 1210  Mobility Specialist Time Calculation (min) (ACUTE ONLY) 17 min   Pt received in bed, agreeable to mobility. Ambulated laps around bed w/ chest tube remaining on suction. C/o feeling sleepy and groggy, otherwise asymptomatic. Pt left in chair with RN and family present.  D'Vante Nicholaus Mobility Specialist Please contact via Special Educational Needs Teacher or Rehab office at [phone]

## 2023-09-30 ENCOUNTER — Inpatient Hospital Stay (HOSPITAL_COMMUNITY): Payer: Medicare Other

## 2023-09-30 DIAGNOSIS — J9601 Acute respiratory failure with hypoxia: Secondary | ICD-10-CM | POA: Diagnosis not present

## 2023-09-30 DIAGNOSIS — J90 Pleural effusion, not elsewhere classified: Secondary | ICD-10-CM | POA: Diagnosis not present

## 2023-09-30 LAB — CBC WITH DIFFERENTIAL/PLATELET
Abs Immature Granulocytes: 0.03 K/uL (ref 0.00–0.07)
Basophils Absolute: 0 K/uL (ref 0.0–0.1)
Basophils Relative: 0 %
Eosinophils Absolute: 0.2 K/uL (ref 0.0–0.5)
Eosinophils Relative: 3 %
HCT: 34.2 % — ABNORMAL LOW (ref 36.0–46.0)
Hemoglobin: 11.2 g/dL — ABNORMAL LOW (ref 12.0–15.0)
Immature Granulocytes: 0 %
Lymphocytes Relative: 31 %
Lymphs Abs: 2.5 K/uL (ref 0.7–4.0)
MCH: 27.9 pg (ref 26.0–34.0)
MCHC: 32.7 g/dL (ref 30.0–36.0)
MCV: 85.1 fL (ref 80.0–100.0)
Monocytes Absolute: 0.6 K/uL (ref 0.1–1.0)
Monocytes Relative: 8 %
Neutro Abs: 4.7 K/uL (ref 1.7–7.7)
Neutrophils Relative %: 58 %
Platelets: 291 K/uL (ref 150–400)
RBC: 4.02 MIL/uL (ref 3.87–5.11)
RDW: 13.9 % (ref 11.5–15.5)
WBC: 8.1 K/uL (ref 4.0–10.5)
nRBC: 0 % (ref 0.0–0.2)

## 2023-09-30 LAB — BODY FLUID CULTURE W GRAM STAIN: Culture: NO GROWTH

## 2023-09-30 LAB — MAGNESIUM: Magnesium: 1.7 mg/dL (ref 1.7–2.4)

## 2023-09-30 LAB — COMPREHENSIVE METABOLIC PANEL WITH GFR
ALT: 10 U/L (ref 0–44)
AST: 13 U/L — ABNORMAL LOW (ref 15–41)
Albumin: 2.3 g/dL — ABNORMAL LOW (ref 3.5–5.0)
Alkaline Phosphatase: 52 U/L (ref 38–126)
Anion gap: 10 (ref 5–15)
BUN: 7 mg/dL — ABNORMAL LOW (ref 8–23)
CO2: 29 mmol/L (ref 22–32)
Calcium: 8.5 mg/dL — ABNORMAL LOW (ref 8.9–10.3)
Chloride: 98 mmol/L (ref 98–111)
Creatinine, Ser: 0.67 mg/dL (ref 0.44–1.00)
GFR, Estimated: >60 mL/min (ref >60)
Glucose, Bld: 111 mg/dL — ABNORMAL HIGH (ref 70–99)
Potassium: 4 mmol/L (ref 3.5–5.1)
Sodium: 137 mmol/L (ref 135–145)
Total Bilirubin: 0.7 mg/dL (ref 0.0–1.2)
Total Protein: 5.2 g/dL — ABNORMAL LOW (ref 6.5–8.1)

## 2023-09-30 LAB — T3: T3, Total: 108 ng/dL (ref 71–180)

## 2023-09-30 LAB — PHOSPHORUS: Phosphorus: 3.7 mg/dL (ref 2.5–4.6)

## 2023-09-30 LAB — BRAIN NATRIURETIC PEPTIDE: B Natriuretic Peptide: 37.5 pg/mL (ref 0.0–100.0)

## 2023-09-30 MED ORDER — IPRATROPIUM-ALBUTEROL 0.5-2.5 (3) MG/3ML IN SOLN
3.0000 mL | Freq: Two times a day (BID) | RESPIRATORY_TRACT | Status: DC
  Administered 2023-09-30 – 2023-10-01 (×2): 3 mL via RESPIRATORY_TRACT
  Filled 2023-09-30 (×2): qty 3

## 2023-09-30 MED ORDER — METHIMAZOLE 10 MG PO TABS
20.0000 mg | ORAL_TABLET | Freq: Every day | ORAL | Status: DC
  Administered 2023-09-30 – 2023-10-03 (×4): 20 mg via ORAL
  Filled 2023-09-30 (×5): qty 2

## 2023-09-30 NOTE — Progress Notes (Signed)
 NAME:  Amanda Potter, MRN:  [id number], DOB:  [birth year], LOS: 3 ADMISSION DATE:  09/26/2023, CONSULTATION DATE: 09/26/2023 REFERRING MD: Rankin River, MD, CHIEF COMPLAINT: Pneumonia, pleural effusion  History of Present Illness:   78 year old with history of GERD, hyperlipidemia, hypertension, sinusitis presenting with progressive cough, shortness of breath.  Symptoms started about 3 weeks ago with a sinus infection which proceeded to cough and dyspnea.  Was given doxycycline , Decadron  and prednisone taper and albuterol  inhaler by primary care as an outpatient.  Complains of worsening dyspnea on minor exertion, inability to lay flat.  Found to have a large right effusion on chest x-ray.  Chest tube placed by emergency room and transferred to [facility] for further management  Pertinent  Medical History    has a past medical history of Deviated nasal septum, Environmental allergies, GERD (gastroesophageal reflux disease), Hyperlipidemia, Hypertension, Perforation of colon (HCC) (2006), and Sinusitis, acute.   Significant Hospital Events: Including procedures, antibiotic start and stop dates in addition to other pertinent events   1/1 admitted with acute hypoxic respiratory failure in the setting of pneumonia and large right pleural effusion.  Right-sided thoracostomy tube placed in the emergency room fluid consistent with exudate post insertion chest x-ray worrisome for lung entrapment 1/2 oxygen requirements improved, pain controlled, lung aeration improved with decreased pneumothorax (really only minimal apical involvement now).  Added RVP, awaiting pleural culture and cytology, added Toradol  for pain, getting CT chest 1/4 No issues overnight, in current drainage system, low air leak (level 1) on am assessment    Interim History / Subjective:  Feeling ok. No shortness of breath. Was supposed to be on suction overnight but on my arrival suction was attached but off at the wall. Son and  daughter at bedside. Questions answered. Patient has been moving around minimally in the room.  Objective   Blood pressure 135/82, pulse 98, temperature 97.8 F (36.6 C), temperature source Oral, resp. rate 17, height 5' 6 (1.676 m), weight 81.6 kg, SpO2 97%.        Intake/Output Summary (Last 24 hours) at 09/30/2023 1241 Last data filed at 09/30/2023 1030 Gross per 24 hour  Intake 240 ml  Output 0 ml  Net 240 ml   Filed Weights   09/26/23 1021  Weight: 81.6 kg    Examination: Elderly woman, no respiratory distress, on nasal cannula Breath sounds diminished right lung base, breathing non labored Tachycardic, regular Chest tube on right side in appropriate position, small air leak with coughing only No peripheral edema  Pleural fluid studies - exudative, lymphocyte predominant  Resolved Hospital Problem list     Assessment & Plan:   Acute hypoxic respiratory  Possible CAP Right parapneumonic effusion Suspect pneuomothorax ex-vacuo -Pct neg, ustrep neg, flu and covid neg, bnp unremarkable, rvp neg P: Continue chest tube to waterseal for the next 24 hours. Chest xray in am.  Follow cytology - still in process Discuss with thoracic surgery on Monday regarding ongoing air leak. I wonder if she will just fail to re-expand.  suspect Mobilize as able  Ensure adequate pain control    PCCM will continue to follow   Best Practice (right click and Reselect all SmartList Selections daily)   Per primary  Signature:   Verdon Gore, MD Pulmonary and Critical Care Medicine [facility] 09/30/2023 12:46 PM Pager: see AMION  If no response to pager, please call critical care on call (see AMION) until 7pm After 7:00 pm call Elink

## 2023-09-30 NOTE — Progress Notes (Signed)
 Duoneb changed to BID per RT assessment. BBS dec'd and clear. Pt does not take home nebs

## 2023-09-30 NOTE — Progress Notes (Signed)
  Progress Note   Patient: Amanda Potter FMW:[id number] DOB: [birth year] DOA: 09/26/2023     3 DOS: the patient was seen and examined on 09/30/2023 at 10:22AM      Brief hospital course: 78 y.o. female with past medical history significant for HTN, GERD, dyslipidemia, recurrent sinusitis, perforated colon following colonoscopy who presented with pneumonia and right-sided pleural effusion.     Assessment and Plan: Acute respiratory failure with hypoxia and hypercapnia due to community-acquired pneumonia and right pleural effusion Large right pleural effusion status post right thoracostomy tube Echocardiogram unremarkable -Continue Augmentin  and doxycycline , day 5 of 7 - Follow-up pleural fluid cytology - Consult pulmonology, management of chest tube per pulmonology  Hyperthyroidism Asymptomatic, may be slightly tachycardic.  Interesting incidental finding. - Follow T3 - Check TSI is - Start methimazole   Hypertension Blood pressure normal - Hold amlodipine  and lisinopril - Hyperlipidemia - Continue simvastatin   Iron deficiency anemia - On ferrous sulfate as an outpatient  Hyponatremia Resolved  Hypokalemia Supplemented and resolved      Subjective: Adult female, she still has pain from her chest tube, but otherwise she is asymptomatic.  Nursing have no concerns.  Chest tube remains in place, evidently there is problem with suction yesterday or this morning.     Physical Exam: BP 135/82 (BP Location: Right Arm)   Pulse 98   Temp 97.8 F (36.6 C) (Oral)   Resp 17   Ht 5' 6 (1.676 m)   Wt 81.6 kg   SpO2 97%   BMI 29.04 kg/m   Elderly adult female, lying in bed, interactive and appropriate Thyroid  is not enlarged or tender, no nodules palpated Tachycardic, regular, no peripheral edema, no respiratory symptoms, lung sounds clear and equal Attention normal, affect appropriate, judgment and insight appear normal   Data Reviewed: Basic metabolic panel shows  normal electrolytes and renal function LFTs normal, albumin low, BNP normal CBC unremarkable  Family Communication: Daughter and son at the bedside    Disposition: Status is: Inpatient         Author: Lonni SHAUNNA Dalton, MD 09/30/2023 1:13 PM  For on call review [email or web address].

## 2023-09-30 NOTE — Progress Notes (Signed)
 Mobility Specialist Progress Note:    09/30/23 1400  Mobility  Activity Ambulated with assistance in hallway  Level of Assistance Contact guard assist, steadying assist  Assistive Device Front wheel walker  Distance Ambulated (ft) 250 ft  Activity Response Tolerated well  Mobility Referral Yes  Mobility visit 1 Mobility  Mobility Specialist Start Time (ACUTE ONLY) 1353  Mobility Specialist Stop Time (ACUTE ONLY) 1405  Mobility Specialist Time Calculation (min) (ACUTE ONLY) 12 min   Pt received in bed, eager for mobility. HR peaked at 142 bpm. Asymptomatic w/ no complaints throughout ambulation. Pt left in chair with call bell and all needs met. Family present.  D'Vante Nicholaus Mobility Specialist Please contact via Special Educational Needs Teacher or Rehab office at [phone]

## 2023-10-01 ENCOUNTER — Inpatient Hospital Stay (HOSPITAL_COMMUNITY): Payer: Medicare Other

## 2023-10-01 DIAGNOSIS — J90 Pleural effusion, not elsewhere classified: Secondary | ICD-10-CM | POA: Diagnosis not present

## 2023-10-01 DIAGNOSIS — J9601 Acute respiratory failure with hypoxia: Secondary | ICD-10-CM | POA: Diagnosis not present

## 2023-10-01 LAB — CULTURE, BLOOD (ROUTINE X 2): Culture: NO GROWTH

## 2023-10-01 LAB — COMPREHENSIVE METABOLIC PANEL
ALT: 11 U/L (ref 0–44)
AST: 12 U/L — ABNORMAL LOW (ref 15–41)
Albumin: 2.3 g/dL — ABNORMAL LOW (ref 3.5–5.0)
Alkaline Phosphatase: 53 U/L (ref 38–126)
Anion gap: 11 (ref 5–15)
BUN: 7 mg/dL — ABNORMAL LOW (ref 8–23)
CO2: 26 mmol/L (ref 22–32)
Calcium: 8.6 mg/dL — ABNORMAL LOW (ref 8.9–10.3)
Chloride: 99 mmol/L (ref 98–111)
Creatinine, Ser: 0.7 mg/dL (ref 0.44–1.00)
GFR, Estimated: >60 mL/min (ref >60)
Glucose, Bld: 110 mg/dL — ABNORMAL HIGH (ref 70–99)
Potassium: 3.7 mmol/L (ref 3.5–5.1)
Sodium: 136 mmol/L (ref 135–145)
Total Bilirubin: 0.8 mg/dL (ref 0.0–1.2)
Total Protein: 5.3 g/dL — ABNORMAL LOW (ref 6.5–8.1)

## 2023-10-01 LAB — CBC WITH DIFFERENTIAL/PLATELET
Abs Immature Granulocytes: 0.03 10*3/uL (ref 0.00–0.07)
Basophils Absolute: 0 10*3/uL (ref 0.0–0.1)
Basophils Relative: 0 %
Eosinophils Absolute: 0.3 10*3/uL (ref 0.0–0.5)
Eosinophils Relative: 4 %
HCT: 35.1 % — ABNORMAL LOW (ref 36.0–46.0)
Hemoglobin: 11.3 g/dL — ABNORMAL LOW (ref 12.0–15.0)
Immature Granulocytes: 0 %
Lymphocytes Relative: 36 %
Lymphs Abs: 2.5 10*3/uL (ref 0.7–4.0)
MCH: 27.6 pg (ref 26.0–34.0)
MCHC: 32.2 g/dL (ref 30.0–36.0)
MCV: 85.8 fL (ref 80.0–100.0)
Monocytes Absolute: 0.5 10*3/uL (ref 0.1–1.0)
Monocytes Relative: 7 %
Neutro Abs: 3.6 10*3/uL (ref 1.7–7.7)
Neutrophils Relative %: 53 %
Platelets: 297 10*3/uL (ref 150–400)
RBC: 4.09 MIL/uL (ref 3.87–5.11)
RDW: 14 % (ref 11.5–15.5)
WBC: 7 10*3/uL (ref 4.0–10.5)
nRBC: 0 % (ref 0.0–0.2)

## 2023-10-01 NOTE — Plan of Care (Signed)
   Problem: Education: Goal: Knowledge of General Education information will improve Description Including pain rating scale, medication(s)/side effects and non-pharmacologic comfort measures Outcome: Progressing

## 2023-10-01 NOTE — Progress Notes (Signed)
   NAME:  Amanda Potter, MRN:  [id number], DOB:  10-16-1945, LOS: 4 ADMISSION DATE:  09/26/2023, CONSULTATION DATE: 09/26/2023 REFERRING MD: Rankin River, MD, CHIEF COMPLAINT: Pneumonia, pleural effusion  History of Present Illness:   78 year old with history of GERD, hyperlipidemia, hypertension, sinusitis presenting with progressive cough, shortness of breath.  Symptoms started about 3 weeks ago with a sinus infection which proceeded to cough and dyspnea.  Was given doxycycline , Decadron  and prednisone taper and albuterol  inhaler by primary care as an outpatient.  Complains of worsening dyspnea on minor exertion, inability to lay flat.  Found to have a large right effusion on chest x-ray.  Chest tube placed by emergency room and transferred to [facility] for further management  Pertinent  Medical History    has a past medical history of Deviated nasal septum, Environmental allergies, GERD (gastroesophageal reflux disease), Hyperlipidemia, Hypertension, Perforation of colon (HCC) (2006), and Sinusitis, acute.   Significant Hospital Events: Including procedures, antibiotic start and stop dates in addition to other pertinent events   1/1 admitted with acute hypoxic respiratory failure in the setting of pneumonia and large right pleural effusion.  Right-sided thoracostomy tube placed in the emergency room fluid consistent with exudate post insertion chest x-ray worrisome for lung entrapment 1/2 oxygen requirements improved, pain controlled, lung aeration improved with decreased pneumothorax (really only minimal apical involvement now).  Added RVP, awaiting pleural culture and cytology, added Toradol  for pain, getting CT chest 1/4 No issues overnight, in current drainage system, low air leak (level 1) on am assessment   1/6-lateral pneumothorax stable   Interim History / Subjective:  No overnight events Feels well generally no significant pain Objective   Blood pressure (!) 116/59, pulse 78,  temperature 97.8 F (36.6 C), temperature source Oral, resp. rate 18, height 5' 6 (1.676 m), weight 81.6 kg, SpO2 95%.        Intake/Output Summary (Last 24 hours) at 10/01/2023 1422 Last data filed at 10/01/2023 0700 Gross per 24 hour  Intake --  Output 100 ml  Net -100 ml   Filed Weights   09/26/23 1021  Weight: 81.6 kg    Examination: Elderly woman, no respiratory distress, on nasal cannula Fair air entry bilaterally, decreased air movement at the bases S1-S2 appreciated Right-sided chest tube with small air leak with coughing  Pleural fluid studies - exudative, lymphocyte predominant  Resolved Hospital Problem list     Assessment & Plan:   Acute hypoxic respiratory  Possible CAP Right parapneumonic effusion Suspect pneuomothorax ex-vacuo -Pct neg, ustrep neg, flu and covid neg, bnp unremarkable, rvp neg P: Continue chest tube to waterseal Chest x-ray is stable with right-sided pneumothorax  The only time that airleak to the waterseal system was with an active cough otherwise no continuous air leak  Encourage mobilization as able  Adequate pain management  May not completely reexpand Will consider discontinue chest tube if no active air leak  Best Practice (right click and Reselect all SmartList Selections daily)   Per primary  Jennet Epley, MD [facility] Pager: See Tracey

## 2023-10-01 NOTE — Progress Notes (Signed)
  Progress Note   Patient: Amanda Potter FMW:[id number] DOB: 1946-05-08 DOA: 09/26/2023     4 DOS: the patient was seen and examined on 10/01/2023 at 8:38AM      Brief hospital course: 78 y.o. female with past medical history significant for HTN, GERD, dyslipidemia, recurrent sinusitis, perforated colon following colonoscopy who presented with pneumonia and right-sided pleural effusion.     Assessment and Plan: Acute respiratory failure with hypoxia and hypercapnia due to community-acquired pneumonia and right pleural effusion Large right pleural effusion status post right thoracostomy tube Echocardiogram unremarkable Chest tube remains in. - Continue Augmentin  and doxycycline , day 6 of 7 - Follow-up pleural fluid cytology - Consult pulmonology, management of chest tube per pulmonology    Hyperthyroidism TSH low, T3 normal but FT4 elevated.  Asymptomatic, may be slightly tachycardic.  Interesting incidental finding. - Check TSI  - Continue new methimazole  - Outpatient endocrinology follow up - CBC  Hypertension Blood pressure normal - Hold amlodipine  and lisinopril  Hyperlipidemia - Continue simvastatin   Iron deficiency anemia - On ferrous sulfate as an outpatient  Hyponatremia Resolved  Hypokalemia Supplemented and resolved      Subjective: No new complaints, no nursing concerns.  Chest tube remains in     Physical Exam: BP (!) 116/59 (BP Location: Right Arm)   Pulse 78   Temp 97.8 F (36.6 C) (Oral)   Resp 18   Ht 5' 6 (1.676 m)   Wt 81.6 kg   SpO2 95%   BMI 29.04 kg/m   Elderly adult female, lying in bed, interactive and appropriate Thyroid  is not enlarged or tender, no nodules palpated Tachycardic, regular, no peripheral edema, no respiratory symptoms, lung sounds clear and equal Attention normal, affect appropriate, judgment and insight appear normal   Data Reviewed: Hemoglobin stable at 11 Comprehensive metabolic panel normal        Disposition: Status is: Inpatient Patient was admitted for respiratory failure and pneumonia with right parapneumonic effusion  She has had a chest tube placed for parapneumonic effusion, and this does not appear to be expanding  Defer chest tube management to pulmonology, once the chest tube is removed, likely she can discharge        Author: Lonni SHAUNNA Dalton, MD 10/01/2023 6:26 PM  For on call review [email or web address]

## 2023-10-01 NOTE — Progress Notes (Signed)
 Physical Therapy Treatment Patient Details Name: Amanda Potter MRN: [id number] DOB: [birth year] Today's Date: 10/01/2023   History of Present Illness Pt is 78 yo presenting to [facility] ED 1/1 with sinus infection which proceeded to respiratory tract infection with large R pleural effusion s/p chest tube placement with incomplete expansion of the lung. PMH: GERD, hyperlipidemia, HTN.    PT Comments  Pt demo mod I bed mobility and transfers. Supervision amb 250' with RW. Steady gait. Primary use of RW to manage CT. Pt in recliner with feet elevated at end of session.     If plan is discharge home, recommend the following: Assistance with cooking/housework;Assist for transportation   Can travel by private vehicle        Equipment Recommendations  None recommended by PT    Recommendations for Other Services       Precautions / Restrictions Precautions Precautions: Fall;Other (comment) Precaution Comments: R CT on waterseal Restrictions Weight Bearing Restrictions Per Provider Order: No     Mobility  Bed Mobility Overal bed mobility: Modified Independent                  Transfers Overall transfer level: Modified independent Equipment used: Ambulation equipment used                    Ambulation/Gait Ambulation/Gait assistance: Supervision Gait Distance (Feet): 250 Feet Assistive device: Rolling walker (2 wheels) Gait Pattern/deviations: Step-through pattern, Decreased stride length Gait velocity: decreased Gait velocity interpretation: <1.31 ft/sec, indicative of household ambulator   General Gait Details: Steady gait. Primary use of RW for CT management.Mild fatigue upon return to room.   Stairs             Wheelchair Mobility     Tilt Bed    Modified Rankin (Stroke Patients Only)       Balance Overall balance assessment: No apparent balance deficits (not formally assessed)                                           Cognition Arousal: Alert Behavior During Therapy: WFL for tasks assessed/performed Overall Cognitive Status: Within Functional Limits for tasks assessed                                          Exercises      General Comments General comments (skin integrity, edema, etc.): VSS on RA      Pertinent Vitals/Pain Pain Assessment Pain Assessment: No/denies pain    Home Living                          Prior Function            PT Goals (current goals can now be found in the care plan section) Acute Rehab PT Goals Patient Stated Goal: home Progress towards PT goals: Progressing toward goals    Frequency    Min 1X/week      PT Plan      Co-evaluation              AM-PAC PT 6 Clicks Mobility   Outcome Measure  Help needed turning from your back to your side while in a flat bed without using bedrails?: None Help needed moving from lying  on your back to sitting on the side of a flat bed without using bedrails?: None Help needed moving to and from a bed to a chair (including a wheelchair)?: A Little Help needed standing up from a chair using your arms (e.g., wheelchair or bedside chair)?: A Little Help needed to walk in hospital room?: A Little Help needed climbing 3-5 steps with a railing? : A Little 6 Click Score: 20    End of Session Equipment Utilized During Treatment: Gait belt Activity Tolerance: Patient tolerated treatment well Patient left: in chair;with call bell/phone within reach Nurse Communication: Mobility status PT Visit Diagnosis: Other abnormalities of gait and mobility (R26.89)     Time: 8956-8892 PT Time Calculation (min) (ACUTE ONLY): 24 min  Charges:    $Gait Training: 23-37 mins PT General Charges $$ ACUTE PT VISIT: 1 Visit                     Sari MATSU., PT  Office # [phone]    Erven Sari Shaker 10/01/2023, 11:44 AM

## 2023-10-02 DIAGNOSIS — J90 Pleural effusion, not elsewhere classified: Secondary | ICD-10-CM | POA: Diagnosis not present

## 2023-10-02 LAB — BASIC METABOLIC PANEL
Anion gap: 12 (ref 5–15)
BUN: 10 mg/dL (ref 8–23)
CO2: 26 mmol/L (ref 22–32)
Calcium: 8.9 mg/dL (ref 8.9–10.3)
Chloride: 97 mmol/L — ABNORMAL LOW (ref 98–111)
Creatinine, Ser: 0.73 mg/dL (ref 0.44–1.00)
GFR, Estimated: >60 mL/min (ref >60)
Glucose, Bld: 111 mg/dL — ABNORMAL HIGH (ref 70–99)
Potassium: 3.4 mmol/L — ABNORMAL LOW (ref 3.5–5.1)
Sodium: 135 mmol/L (ref 135–145)

## 2023-10-02 LAB — CBC
HCT: 34.6 % — ABNORMAL LOW (ref 36.0–46.0)
Hemoglobin: 11.5 g/dL — ABNORMAL LOW (ref 12.0–15.0)
MCH: 28 pg (ref 26.0–34.0)
MCHC: 33.2 g/dL (ref 30.0–36.0)
MCV: 84.2 fL (ref 80.0–100.0)
Platelets: 289 10*3/uL (ref 150–400)
RBC: 4.11 MIL/uL (ref 3.87–5.11)
RDW: 14.1 % (ref 11.5–15.5)
WBC: 8.5 10*3/uL (ref 4.0–10.5)
nRBC: 0 % (ref 0.0–0.2)

## 2023-10-02 LAB — THYROID STIMULATING IMMUNOGLOBULIN: Thyroid Stimulating Immunoglob: <0.1 [IU]/L (ref 0.00–0.55)

## 2023-10-02 MED ORDER — AMOXICILLIN-POT CLAVULANATE 875-125 MG PO TABS
1.0000 | ORAL_TABLET | Freq: Two times a day (BID) | ORAL | Status: AC
Start: 2023-10-02 — End: 2023-10-02
  Administered 2023-10-02 (×2): 1 via ORAL
  Filled 2023-10-02 (×2): qty 1

## 2023-10-02 MED ORDER — POTASSIUM CHLORIDE CRYS ER 20 MEQ PO TBCR
30.0000 meq | EXTENDED_RELEASE_TABLET | ORAL | Status: AC
  Administered 2023-10-02 (×2): 30 meq via ORAL
  Filled 2023-10-02 (×2): qty 1

## 2023-10-02 MED ORDER — GUAIFENESIN ER 600 MG PO TB12: 600.0000 mg | ORAL_TABLET | Freq: Two times a day (BID) | ORAL | Status: DC | PRN

## 2023-10-02 MED ORDER — ONDANSETRON HCL 4 MG/2ML IJ SOLN
INTRAMUSCULAR | Status: AC
  Filled 2023-10-02: qty 2

## 2023-10-02 MED ORDER — ONDANSETRON HCL 4 MG/2ML IJ SOLN
4.0000 mg | Freq: Four times a day (QID) | INTRAMUSCULAR | Status: DC | PRN
  Administered 2023-10-02: 4 mg via INTRAVENOUS

## 2023-10-02 NOTE — Progress Notes (Signed)
 Mobility Specialist Progress Note:   10/02/23 0950  Mobility  Activity Ambulated with assistance in hallway  Level of Assistance Standby assist, set-up cues, supervision of patient - no hands on  Assistive Device Front wheel walker  Distance Ambulated (ft) 350 ft  Activity Response Tolerated well  Mobility Referral Yes  Mobility visit 1 Mobility  Mobility Specialist Start Time (ACUTE ONLY) 0930  Mobility Specialist Stop Time (ACUTE ONLY) 0940  Mobility Specialist Time Calculation (min) (ACUTE ONLY) 10 min   Pt received in bed, agreeable to mobility. No physical assistance needed for OOB. Pt denied any discomfort during ambulation, asx throughout. Pt left in chair with call bell in reach and all needs met.   Brown Husband  Mobility Specialist Please contact via [employer] office at [phone]

## 2023-10-02 NOTE — Progress Notes (Signed)
 PROGRESS NOTE    Amanda Potter  FMW:[id number] DOB: [birth year] DOA: 09/26/2023 PCP: Debrah Josette ORN., PA-C    Brief Narrative:   Amanda Potter is a 78 y.o. female with past medical history significant for HTN, GERD, dyslipidemia, recurrent sinusitis, perforated colon following colonoscopy who presented to [facility] ED on 09/25/2022 progressive shortness of breath, cough, congestion over the past 3-4 weeks.  Initially was reporting left sided maxillary sinus fullness/redness and saw her PCP and was given a 10-day course of oral antibiotics and steroid taper.  She reports her symptoms improved however 7-8 days ago she started experiencing progressive shortness of breath, chest tightness.  Worse with minimal exertion and laying flat.  In the ED, temperature 97.7 F, HR 109, RR 26, BP 126/62, SpO2 94% on room air.  WBC 10.6, hemoglobin 13.0, platelet count 370.  Sodium 127, potassium 2.4, chloride 85, CO2 33, glucose 130, BUN 9, creatinine 0.60.  AST 12, ALT 7, total bilirubin 1.7.  BNP 16.4.  COVID/influenza/RSV PCR negative.  Chest x-ray with right base collapse/consolidation of large right pleural effusion, with concern of neoplasm given unilateral disease.  Chest tube was placed by ED physician at [facility].  PCCM consulted and patient was transferred to [facility] on the hospital service for further evaluation and management of large right-sided pleural effusion and community-acquired pneumonia.  Assessment & Plan:   Acute hypoxic respiratory failure Exudative right pleural effusion Community-acquired pneumonia Patient presents to ED with progressive shortness of breath.  Chest x-ray with large right-sided pleural effusion.  Chest tube placed by ED physician on 09/26/2023.  COVID/RSV/influenza PCR negative.  Respiratory viral panel negative.  MRSA PCR negative.  Pleural fluid analysis consistent with exudative effusion, possibly from underlying pneumonia but also  concern for malignancy. -- PCCM following, appreciate assistance -- Blood cultures x 2: No growth x 5 days -- General Fluid culture: No growth x 3 days -- Cytology: Reactive mesothelial cells with macrophages, no atypia seen. -- Chest tube output: 3.6L since placement -- Doxycycline  100 mg p.o. every 12 hours -- Augmentin  875/125 mg p.o. every 12 hours -- Continue chest tube to suction; monitor output -- Repeat chest x-ray in a.m. -- Further per PCCM  Hyperthyroidism TSH low, T3 normal but FT4 elevated.  Thyroid -stimulating immunoglobulin less then 0.10.  Patient asymptomatic.  - Check TSI  - Continue new methimazole  20mg  PO daily - Outpatient endocrinology follow up  Hyponatremia Sodium 127 on admission.  Serum osmolality 274 (low), urine sodium less than 20, urine osmolality 148 (low).  TSH slightly low at 0.301. -- Na 127>129>>135 -- Encourage increased oral intake -- BMP daily  Hypokalemia Potassium 3.4 this morning, will replete. -- Repeat electrolytes in a.m. to include magnesium   Hypochloremia Chloride 85 on admission, likely secondary to poor oral intake/dehydration -- Cl 85>93>>97 -- BMP daily  Essential hypertension On lisinopril 10 mg p.o. daily and amlodipine  10 mg PO daily outpatient.  Blood pressures have been borderline low at time of admission.  TTE with LVEF 65-70%, no LV regional wall motion abnormalities, grade 1 diastolic dysfunction, LA/RA mildly dilated, IVC normal in size, no aortic stenosis. -- Continue to hold home lisinopril and amlodipine  -- Monitor BP closely  Dyslipidemia -- Zocor  20 mg p.o. daily  GERD -- Protonix  20 mg p.o. daily   DVT prophylaxis: heparin  injection 5,000 Units Start: 09/26/23 2200    Code Status: Full Code Family Communication: No family present at bedside this morning  Disposition Plan:  Level of care:  Telemetry Medical Status is: Inpatient Remains inpatient appropriate because: Chest tube, awaiting further  recommendations from PCCM    Consultants:  PCCM  Procedures:  Chest tube placement by ED physician, 1/1  Antimicrobials:  Doxycycline  1/1>> Unasyn  1/1 - 1/3 Augmentin  1/3>>   Subjective: Patient seen examined bedside, lying in bed.  No specific complaints this morning.  Continues with chest tube in place.   Patient with no specific complaints, questions, or concerns at this time.  Denies headache, no dizziness, no chest pain, no fever/chills/night sweats, no nausea/vomiting/diarrhea, abdominal pain, no focal weakness, no fatigue, no paresthesia.  No acute events overnight per nursing staff.  Objective: Vitals:   10/02/23 0321 10/02/23 0843 10/02/23 1322 10/02/23 1633  BP: 133/75 121/64 122/66 108/72  Pulse: 93 99 (!) 106 (!) 101  Resp: 18     Temp: 97.6 F (36.4 C) 98 F (36.7 C) 97.7 F (36.5 C) 97.7 F (36.5 C)  TempSrc: Oral Oral Oral Oral  SpO2: 95% 95% 96% 95%  Weight:      Height:        Intake/Output Summary (Last 24 hours) at 10/02/2023 1658 Last data filed at 10/02/2023 0550 Gross per 24 hour  Intake --  Output 50 ml  Net -50 ml   Filed Weights   09/26/23 1021  Weight: 81.6 kg    Examination:  Physical Exam: GEN: NAD, alert and oriented x 3, elderly in appearance HEENT: NCAT, PERRL, EOMI, sclera clear, MMM PULM: Diminished breath sounds right midlung/base, normal respiratory effort without accessory muscle use, on room air at rest with SpO2 95% CV: RRR w/o M/G/R GI: abd soft, NTND, NABS, no R/G/M MSK: no peripheral edema, muscle strength globally intact 5/5 bilateral upper/lower extremities NEURO: CN II-XII intact, no focal deficits, sensation to light touch intact PSYCH: normal mood/affect Integumentary: dry/intact, no rashes or wounds    Data Reviewed: I have personally reviewed following labs and imaging studies  CBC: Recent Labs  Lab 09/27/23 0313 09/28/23 0350 09/29/23 0247 09/30/23 0305 10/01/23 0239 10/02/23 0315  WBC 10.4 10.2 8.0  8.1 7.0 8.5  NEUTROABS 8.7* 6.1 4.6 4.7 3.6  --   HGB 12.8 10.6* 10.9* 11.2* 11.3* 11.5*  HCT 37.5 33.0* 32.8* 34.2* 35.1* 34.6*  MCV 81.3 86.4 84.8 85.1 85.8 84.2  PLT 402* 304 289 291 297 289   Basic Metabolic Panel: Recent Labs  Lab 09/27/23 0313 09/28/23 0350 09/29/23 0247 09/30/23 0305 10/01/23 0239 10/02/23 0315  NA 129* 136 136 137 136 135  K 4.0 3.5 3.4* 4.0 3.7 3.4*  CL 93* 99 98 98 99 97*  CO2 24 30 29 29 26 26   GLUCOSE 139* 95 95 111* 110* 111*  BUN <5* 13 9 7* 7* 10  CREATININE 0.64 0.71 0.56 0.67 0.70 0.73  CALCIUM 8.5* 8.3* 8.3* 8.5* 8.6* 8.9  MG 2.2 2.0 1.7 1.7  --   --   PHOS 3.0 3.2 3.3 3.7  --   --    GFR: Estimated Creatinine Clearance: 61.4 mL/min (by C-G formula based on SCr of 0.73 mg/dL). Liver Function Tests: Recent Labs  Lab 09/27/23 0313 09/28/23 0350 09/29/23 0247 09/30/23 0305 10/01/23 0239  AST 16 14* 13* 13* 12*  ALT 9 10 11 10 11   ALKPHOS 63 48 50 52 53  BILITOT 0.9 0.4 0.8 0.7 0.8  PROT 5.9* 5.1* 5.1* 5.2* 5.3*  ALBUMIN 2.5* 2.2* 2.2* 2.3* 2.3*   No results for input(s): LIPASE, AMYLASE in the last 168 hours. No  results for input(s): AMMONIA in the last 168 hours. Coagulation Profile: No results for input(s): INR, PROTIME in the last 168 hours. Cardiac Enzymes: No results for input(s): CKTOTAL, CKMB, CKMBINDEX, TROPONINI in the last 168 hours. BNP (last 3 results) No results for input(s): PROBNP in the last 8760 hours. HbA1C: No results for input(s): HGBA1C in the last 72 hours. CBG: No results for input(s): GLUCAP in the last 168 hours. Lipid Profile: No results for input(s): CHOL, HDL, LDLCALC, TRIG, CHOLHDL, LDLDIRECT in the last 72 hours. Thyroid  Function Tests: No results for input(s): TSH, T4TOTAL, FREET4, T3FREE, THYROIDAB in the last 72 hours.  Anemia Panel: No results for input(s): VITAMINB12, FOLATE, FERRITIN, TIBC, IRON, RETICCTPCT in the last 72  hours. Sepsis Labs: Recent Labs  Lab 09/26/23 1817  PROCALCITON <0.10    Recent Results (from the past 240 hours)  Resp panel by RT-PCR (RSV, Flu A&B, Covid) Anterior Nasal Swab     Status: None   Collection Time: 09/26/23 11:39 AM   Specimen: Anterior Nasal Swab  Result Value Ref Range Status   SARS Coronavirus 2 by RT PCR NEGATIVE NEGATIVE Final    Comment: (NOTE) SARS-CoV-2 target nucleic acids are NOT DETECTED.  The SARS-CoV-2 RNA is generally detectable in upper respiratory specimens during the acute phase of infection. The lowest concentration of SARS-CoV-2 viral copies this assay can detect is 138 copies/mL. A negative result does not preclude SARS-Cov-2 infection and should not be used as the sole basis for treatment or other patient management decisions. A negative result may occur with  improper specimen collection/handling, submission of specimen other than nasopharyngeal swab, presence of viral mutation(s) within the areas targeted by this assay, and inadequate number of viral copies(<138 copies/mL). A negative result must be combined with clinical observations, patient history, and epidemiological information. The expected result is Negative.  Fact Sheet for Patients:  bloggercourse.com  Fact Sheet for Healthcare Providers:  seriousbroker.it  This test is no t yet approved or cleared by the [place]  FDA and  has been authorized for detection and/or diagnosis of SARS-CoV-2 by FDA under an Emergency Use Authorization (EUA). This EUA will remain  in effect (meaning this test can be used) for the duration of the COVID-19 declaration under Section 564(b)(1) of the Act, 21 U.S.C.section 360bbb-3(b)(1), unless the authorization is terminated  or revoked sooner.       Influenza A by PCR NEGATIVE NEGATIVE Final   Influenza B by PCR NEGATIVE NEGATIVE Final    Comment: (NOTE) The Xpert Xpress SARS-CoV-2/FLU/RSV  plus assay is intended as an aid in the diagnosis of influenza from Nasopharyngeal swab specimens and should not be used as a sole basis for treatment. Nasal washings and aspirates are unacceptable for Xpert Xpress SARS-CoV-2/FLU/RSV testing.  Fact Sheet for Patients: bloggercourse.com  Fact Sheet for Healthcare Providers: seriousbroker.it  This test is not yet approved or cleared by the [place]  FDA and has been authorized for detection and/or diagnosis of SARS-CoV-2 by FDA under an Emergency Use Authorization (EUA). This EUA will remain in effect (meaning this test can be used) for the duration of the COVID-19 declaration under Section 564(b)(1) of the Act, 21 U.S.C. section 360bbb-3(b)(1), unless the authorization is terminated or revoked.     Resp Syncytial Virus by PCR NEGATIVE NEGATIVE Final    Comment: (NOTE) Fact Sheet for Patients: bloggercourse.com  Fact Sheet for Healthcare Providers: seriousbroker.it  This test is not yet approved or cleared by the [place]  FDA and has been  authorized for detection and/or diagnosis of SARS-CoV-2 by FDA under an Emergency Use Authorization (EUA). This EUA will remain in effect (meaning this test can be used) for the duration of the COVID-19 declaration under Section 564(b)(1) of the Act, 21 U.S.C. section 360bbb-3(b)(1), unless the authorization is terminated or revoked.  Performed at [facility], [address] [place], [place] [address]   Body fluid culture w Gram Stain     Status: None   Collection Time: 09/26/23  1:22 PM   Specimen: Pleural Fluid  Result Value Ref Range Status   Specimen Description   Final    PLEURAL RIGHT Performed at [name], [address], [place], [place] [address]    Special Requests   Final    NONE Performed at [facility], [address], [place], [place] [address]    Gram Stain   Final    WBC PRESENT,BOTH PMN AND MONONUCLEAR NO ORGANISMS SEEN    Culture   Final    NO GROWTH 3 DAYS Performed at [facility], [address] [place], [place] [address]    Report Status 09/30/2023 FINAL  Final  MRSA Next Gen by PCR, Nasal     Status: None   Collection Time: 09/26/23  7:00 PM  Result Value Ref Range Status   MRSA by PCR Next Gen NOT DETECTED NOT DETECTED Final    Comment: (NOTE) The GeneXpert MRSA Assay (FDA approved for NASAL specimens only), is one component of a comprehensive MRSA colonization surveillance program. It is not intended to diagnose MRSA infection nor to guide or monitor treatment for MRSA infections. Test performance is not FDA approved in patients less than 28 years old. Performed at [facility], [address], [place], [place] [address]   Culture, blood (Routine X 2) w Reflex to ID Panel     Status: None   Collection Time: 09/26/23  7:27 PM   Specimen: BLOOD RIGHT HAND  Result Value Ref Range Status   Specimen Description BLOOD RIGHT HAND  Final   Special Requests   Final    BOTTLES DRAWN AEROBIC AND ANAEROBIC Blood Culture results may not be optimal due to an inadequate volume of blood received in culture bottles   Culture   Final    NO GROWTH 5 DAYS Performed at [facility], [address] [place], [place] [address]    Report Status 10/01/2023 FINAL  Final  Culture, blood (Routine X 2) w Reflex to ID Panel     Status: None (Preliminary result)   Collection Time: 09/26/23  7:46 PM   Specimen: BLOOD LEFT ARM  Result Value Ref Range Status   Specimen Description BLOOD LEFT ARM  Final   Special Requests   Final    BOTTLES DRAWN AEROBIC AND ANAEROBIC Blood Culture results may not be optimal due to an inadequate volume of blood received in culture bottles   Culture  Setup Time   Final    GRAM POSITIVE RODS AEROBIC BOTTLE ONLY CRITICAL RESULT CALLED TO, READ  BACK BY AND VERIFIED WITH: PHARMD JIMMY WYLAND ON 09/28/23 @ 2301 BY DRT    Culture   Final    CULTURE REINCUBATED FOR BETTER GROWTH Performed at [facility], [address] [place], [place] [address]    Report Status PENDING  Incomplete  Respiratory (~20 pathogens) panel by PCR     Status: None   Collection Time: 09/27/23  8:59 AM  Specimen: Nasopharyngeal Swab; Respiratory  Result Value Ref Range Status   Adenovirus NOT DETECTED NOT DETECTED Final   Coronavirus 229E NOT DETECTED NOT DETECTED Final    Comment: (NOTE) The Coronavirus on the Respiratory Panel, DOES NOT test for the novel  Coronavirus (2019 nCoV)    Coronavirus HKU1 NOT DETECTED NOT DETECTED Final   Coronavirus NL63 NOT DETECTED NOT DETECTED Final   Coronavirus OC43 NOT DETECTED NOT DETECTED Final   Metapneumovirus NOT DETECTED NOT DETECTED Final   Rhinovirus / Enterovirus NOT DETECTED NOT DETECTED Final   Influenza A NOT DETECTED NOT DETECTED Final   Influenza B NOT DETECTED NOT DETECTED Final   Parainfluenza Virus 1 NOT DETECTED NOT DETECTED Final   Parainfluenza Virus 2 NOT DETECTED NOT DETECTED Final   Parainfluenza Virus 3 NOT DETECTED NOT DETECTED Final   Parainfluenza Virus 4 NOT DETECTED NOT DETECTED Final   Respiratory Syncytial Virus NOT DETECTED NOT DETECTED Final   Bordetella pertussis NOT DETECTED NOT DETECTED Final   Bordetella Parapertussis NOT DETECTED NOT DETECTED Final   Chlamydophila pneumoniae NOT DETECTED NOT DETECTED Final   Mycoplasma pneumoniae NOT DETECTED NOT DETECTED Final    Comment: Performed at [facility], [address], [place], [place] [address]         Radiology Studies: DG CHEST PORT 1 VIEW Result Date: 10/01/2023 CLINICAL DATA:  Chest tube EXAM: PORTABLE CHEST - 1 VIEW COMPARISON:  the previous day's study FINDINGS: Stable right lateral pigtail chest tube, with small adjacent residual lateral pneumothorax. minimal regional subcutaneous emphysema. Some patchy  airspace opacities laterally at the right lung base, and in the infrahilar regions left greater than right. Heart size and mediastinal contours are within normal limits. Aortic Atherosclerosis (ICD10-170.0). No effusion. Bilateral shoulder DJD.  Cholecystectomy clips. IMPRESSION: Stable right chest tube with tiny lateral residual pneumothorax. Electronically Signed   By: JONETTA Faes M.D.   On: 10/01/2023 10:24        Scheduled Meds:  amoxicillin -clavulanate  1 tablet Oral Q12H   aspirin  EC  81 mg Oral Q1200   doxycycline   100 mg Oral Q12H   heparin   5,000 Units Subcutaneous Q8H   methIMAzole   20 mg Oral Daily   pantoprazole   40 mg Oral Daily   simvastatin   20 mg Oral QHS   Continuous Infusions:     LOS: 5 days    Time spent: 53 minutes spent on chart review, discussion with nursing staff, consultants, updating family and interview/physical exam; more than 50% of that time was spent in counseling and/or coordination of care.    Amanda PARAS Kristeen Lantz, DO [facility] Available via Epic secure chat 7am-7pm After these hours, please refer to coverage provider listed on [email or web address] 10/02/2023, 4:58 PM

## 2023-10-03 ENCOUNTER — Inpatient Hospital Stay (HOSPITAL_COMMUNITY): Payer: Medicare Other

## 2023-10-03 DIAGNOSIS — J90 Pleural effusion, not elsewhere classified: Secondary | ICD-10-CM | POA: Diagnosis not present

## 2023-10-03 DIAGNOSIS — J910 Malignant pleural effusion: Secondary | ICD-10-CM | POA: Diagnosis present

## 2023-10-03 DIAGNOSIS — J9601 Acute respiratory failure with hypoxia: Secondary | ICD-10-CM | POA: Diagnosis not present

## 2023-10-03 LAB — BASIC METABOLIC PANEL
Anion gap: 7 (ref 5–15)
BUN: 9 mg/dL (ref 8–23)
CO2: 29 mmol/L (ref 22–32)
Calcium: 9.1 mg/dL (ref 8.9–10.3)
Chloride: 101 mmol/L (ref 98–111)
Creatinine, Ser: 0.86 mg/dL (ref 0.44–1.00)
GFR, Estimated: >60 mL/min (ref >60)
Glucose, Bld: 109 mg/dL — ABNORMAL HIGH (ref 70–99)
Potassium: 4.5 mmol/L (ref 3.5–5.1)
Sodium: 137 mmol/L (ref 135–145)

## 2023-10-03 LAB — CULTURE, BLOOD (ROUTINE X 2)

## 2023-10-03 LAB — MAGNESIUM: Magnesium: 1.8 mg/dL (ref 1.7–2.4)

## 2023-10-03 LAB — CYTOLOGY - NON PAP

## 2023-10-03 MED ORDER — IOHEXOL 350 MG/ML SOLN
75.0000 mL | Freq: Once | INTRAVENOUS | Status: AC | PRN
  Administered 2023-10-03: 75 mL via INTRAVENOUS

## 2023-10-03 NOTE — Progress Notes (Signed)
 PROGRESS NOTE    Amanda Potter  FMW:[id number] DOB: 1946/05/18 DOA: 09/26/2023 PCP: Debrah Josette ORN., PA-C    Brief Narrative:   Amanda Potter is a 78 y.o. female with past medical history significant for HTN, GERD, dyslipidemia, recurrent sinusitis, perforated colon following colonoscopy who presented to [facility] ED on 09/25/2022 progressive shortness of breath, cough, congestion over the past 3-4 weeks.  Initially was reporting left sided maxillary sinus fullness/redness and saw her PCP and was given a 10-day course of oral antibiotics and steroid taper.  She reports her symptoms improved however 7-8 days ago she started experiencing progressive shortness of breath, chest tightness.  Worse with minimal exertion and laying flat.  In the ED, temperature 97.7 F, HR 109, RR 26, BP 126/62, SpO2 94% on room air.  WBC 10.6, hemoglobin 13.0, platelet count 370.  Sodium 127, potassium 2.4, chloride 85, CO2 33, glucose 130, BUN 9, creatinine 0.60.  AST 12, ALT 7, total bilirubin 1.7.  BNP 16.4.  COVID/influenza/RSV PCR negative.  Chest x-ray with right base collapse/consolidation of large right pleural effusion, with concern of neoplasm given unilateral disease.  Chest tube was placed by ED physician at [facility].  PCCM consulted and patient was transferred to [facility] on the hospital service for further evaluation and management of large right-sided pleural effusion and community-acquired pneumonia.  Assessment & Plan:   Acute hypoxic respiratory failure Malignant right pleural effusion Community-acquired pneumonia Patient presents to ED with progressive shortness of breath.  Chest x-ray with large right-sided pleural effusion.  Chest tube placed by ED physician on 09/26/2023.  COVID/RSV/influenza PCR negative.  Respiratory viral panel negative.  MRSA PCR negative.  Pleural fluid analysis consistent with exudative effusion, possibly from underlying pneumonia but also  concern for malignancy. 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  Completed course of antibiotics with doxycycline  and Augmentin . -- PCCM following, appreciate assistance -- Blood cultures x 2: No growth x 5 days -- Pleural fluid culture: No growth x 3 days -- Chest tube removed 1/8 -- Repeat chest x-ray in a.m. -- Workup for malignancy to include CT chest/abdomen/pelvis with contrast, CEA, CA 19-9, AFP, CA125. -- Outpatient referral to medical oncology  Hyperthyroidism TSH low, T3 normal but FT4 elevated.  Thyroid -stimulating immunoglobulin less then 0.10.  Patient asymptomatic.  - Continue new methimazole  20mg  PO daily - Outpatient endocrinology follow up  Hyponatremia Sodium 127 on admission.  Serum osmolality 274 (low), urine sodium less than 20, urine osmolality 148 (low).  TSH slightly low at 0.301. -- Na 127>129>>135>137 -- Encourage increased oral intake -- BMP daily  Hypokalemia Potassium 4.5 this morning -- Repeat electrolytes in a.m. to include magnesium   Hypochloremia Chloride 85 on admission, likely secondary to poor oral intake/dehydration -- Cl 85>93>>97>105 -- BMP daily  Essential hypertension On lisinopril 10 mg p.o. daily and amlodipine  10 mg PO daily outpatient.  Blood pressures have been borderline low at time of admission.  TTE with LVEF 65-70%, no LV regional wall motion abnormalities, grade 1 diastolic dysfunction, LA/RA mildly dilated, IVC normal in size, no aortic stenosis. -- Continue to hold home lisinopril and amlodipine  -- Monitor BP closely  Dyslipidemia -- Zocor  20 mg p.o. daily  GERD -- Protonix  20 mg p.o. daily   DVT prophylaxis: heparin  injection 5,000 Units Start: 09/26/23 2200    Code Status: Full Code Family Communication: No family present at bedside this morning  Disposition Plan:  Level of care:  Telemetry Medical Status is: Inpatient Remains  inpatient appropriate because: Chest tube removed today, plan repeat chest x-ray in the morning, malignancy workup as above,    Consultants:  PCCM  Procedures:  Chest tube placement by ED physician, 1/1  Antimicrobials:  Doxycycline  1/1>> Unasyn  1/1 - 1/3 Augmentin  1/3>>   Subjective: Patient seen examined bedside, lying in bed.  No specific complaints this morning.  Continues with chest tube in place.   Patient with no specific complaints, questions, or concerns at this time.  Denies headache, no dizziness, no chest pain, no fever/chills/night sweats, no nausea/vomiting/diarrhea, abdominal pain, no focal weakness, no fatigue, no paresthesia.  No acute events overnight per nursing staff.  Objective: Vitals:   10/02/23 2356 10/03/23 0314 10/03/23 0900 10/03/23 1100  BP:  122/73 96/61 111/72  Pulse:  98 65 99  Resp: 18 18 18    Temp: 98 F (36.7 C) 97.6 F (36.4 C) 97.9 F (36.6 C)   TempSrc: Oral Oral Oral   SpO2: 98% 98% 94% 99%  Weight:      Height:        Intake/Output Summary (Last 24 hours) at 10/03/2023 1131 Last data filed at 10/03/2023 0616 Gross per 24 hour  Intake --  Output 110 ml  Net -110 ml   Filed Weights   09/26/23 1021  Weight: 81.6 kg    Examination:  Physical Exam: GEN: NAD, alert and oriented x 3, elderly in appearance HEENT: NCAT, PERRL, EOMI, sclera clear, MMM PULM: Diminished breath sounds right midlung/base, normal respiratory effort without accessory muscle use, on room air at rest with SpO2 95% CV: RRR w/o M/G/R GI: abd soft, NTND, NABS, no R/G/M MSK: no peripheral edema, muscle strength globally intact 5/5 bilateral upper/lower extremities NEURO: CN II-XII intact, no focal deficits, sensation to light touch intact PSYCH: normal mood/affect Integumentary: dry/intact, no rashes or wounds    Data Reviewed: I have personally reviewed following labs and imaging studies  CBC: Recent Labs  Lab 09/27/23 0313 09/28/23 0350 09/29/23 0247  09/30/23 0305 10/01/23 0239 10/02/23 0315  WBC 10.4 10.2 8.0 8.1 7.0 8.5  NEUTROABS 8.7* 6.1 4.6 4.7 3.6  --   HGB 12.8 10.6* 10.9* 11.2* 11.3* 11.5*  HCT 37.5 33.0* 32.8* 34.2* 35.1* 34.6*  MCV 81.3 86.4 84.8 85.1 85.8 84.2  PLT 402* 304 289 291 297 289   Basic Metabolic Panel: Recent Labs  Lab 09/27/23 0313 09/28/23 0350 09/29/23 0247 09/30/23 0305 10/01/23 0239 10/02/23 0315 10/03/23 0341  NA 129* 136 136 137 136 135 137  K 4.0 3.5 3.4* 4.0 3.7 3.4* 4.5  CL 93* 99 98 98 99 97* 101  CO2 24 30 29 29 26 26 29   GLUCOSE 139* 95 95 111* 110* 111* 109*  BUN <5* 13 9 7* 7* 10 9  CREATININE 0.64 0.71 0.56 0.67 0.70 0.73 0.86  CALCIUM 8.5* 8.3* 8.3* 8.5* 8.6* 8.9 9.1  MG 2.2 2.0 1.7 1.7  --   --  1.8  PHOS 3.0 3.2 3.3 3.7  --   --   --    GFR: Estimated Creatinine Clearance: 57.1 mL/min (by C-G formula based on SCr of 0.86 mg/dL). Liver Function Tests: Recent Labs  Lab 09/27/23 0313 09/28/23 0350 09/29/23 0247 09/30/23 0305 10/01/23 0239  AST 16 14* 13* 13* 12*  ALT 9 10 11 10 11   ALKPHOS 63 48 50 52 53  BILITOT 0.9 0.4 0.8 0.7 0.8  PROT 5.9* 5.1* 5.1* 5.2* 5.3*  ALBUMIN 2.5* 2.2*  2.2* 2.3* 2.3*   No results for input(s): LIPASE, AMYLASE in the last 168 hours. No results for input(s): AMMONIA in the last 168 hours. Coagulation Profile: No results for input(s): INR, PROTIME in the last 168 hours. Cardiac Enzymes: No results for input(s): CKTOTAL, CKMB, CKMBINDEX, TROPONINI in the last 168 hours. BNP (last 3 results) No results for input(s): PROBNP in the last 8760 hours. HbA1C: No results for input(s): HGBA1C in the last 72 hours. CBG: No results for input(s): GLUCAP in the last 168 hours. Lipid Profile: No results for input(s): CHOL, HDL, LDLCALC, TRIG, CHOLHDL, LDLDIRECT in the last 72 hours. Thyroid  Function Tests: No results for input(s): TSH, T4TOTAL, FREET4, T3FREE, THYROIDAB in the last 72 hours.  Anemia  Panel: No results for input(s): VITAMINB12, FOLATE, FERRITIN, TIBC, IRON, RETICCTPCT in the last 72 hours. Sepsis Labs: Recent Labs  Lab 09/26/23 1817  PROCALCITON <0.10    Recent Results (from the past 240 hours)  Resp panel by RT-PCR (RSV, Flu A&B, Covid) Anterior Nasal Swab     Status: None   Collection Time: 09/26/23 11:39 AM   Specimen: Anterior Nasal Swab  Result Value Ref Range Status   SARS Coronavirus 2 by RT PCR NEGATIVE NEGATIVE Final    Comment: (NOTE) SARS-CoV-2 target nucleic acids are NOT DETECTED.  The SARS-CoV-2 RNA is generally detectable in upper respiratory specimens during the acute phase of infection. The lowest concentration of SARS-CoV-2 viral copies this assay can detect is 138 copies/mL. A negative result does not preclude SARS-Cov-2 infection and should not be used as the sole basis for treatment or other patient management decisions. A negative result may occur with  improper specimen collection/handling, submission of specimen other than nasopharyngeal swab, presence of viral mutation(s) within the areas targeted by this assay, and inadequate number of viral copies(<138 copies/mL). A negative result must be combined with clinical observations, patient history, and epidemiological information. The expected result is Negative.  Fact Sheet for Patients:  bloggercourse.com  Fact Sheet for Healthcare Providers:  seriousbroker.it  This test is no t yet approved or cleared by the [place]  FDA and  has been authorized for detection and/or diagnosis of SARS-CoV-2 by FDA under an Emergency Use Authorization (EUA). This EUA will remain  in effect (meaning this test can be used) for the duration of the COVID-19 declaration under Section 564(b)(1) of the Act, 21 U.S.C.section 360bbb-3(b)(1), unless the authorization is terminated  or revoked sooner.       Influenza A by PCR NEGATIVE  NEGATIVE Final   Influenza B by PCR NEGATIVE NEGATIVE Final    Comment: (NOTE) The Xpert Xpress SARS-CoV-2/FLU/RSV plus assay is intended as an aid in the diagnosis of influenza from Nasopharyngeal swab specimens and should not be used as a sole basis for treatment. Nasal washings and aspirates are unacceptable for Xpert Xpress SARS-CoV-2/FLU/RSV testing.  Fact Sheet for Patients: bloggercourse.com  Fact Sheet for Healthcare Providers: seriousbroker.it  This test is not yet approved or cleared by the [place]  FDA and has been authorized for detection and/or diagnosis of SARS-CoV-2 by FDA under an Emergency Use Authorization (EUA). This EUA will remain in effect (meaning this test can be used) for the duration of the COVID-19 declaration under Section 564(b)(1) of the Act, 21 U.S.C. section 360bbb-3(b)(1), unless the authorization is terminated or revoked.     Resp Syncytial Virus by PCR NEGATIVE NEGATIVE Final    Comment: (NOTE) Fact Sheet for Patients: bloggercourse.com  Fact Sheet for Healthcare Providers: seriousbroker.it  This test is not yet approved or cleared by the [place]  FDA and has been authorized for detection and/or diagnosis of SARS-CoV-2 by FDA under an Emergency Use Authorization (EUA). This EUA will remain in effect (meaning this test can be used) for the duration of the COVID-19 declaration under Section 564(b)(1) of the Act, 21 U.S.C. section 360bbb-3(b)(1), unless the authorization is terminated or revoked.  Performed at [facility], [address], [place], [place] [address]   Body fluid culture w Gram Stain     Status: None   Collection Time: 09/26/23  1:22 PM   Specimen: Pleural Fluid  Result Value Ref Range Status   Specimen Description   Final    PLEURAL RIGHT Performed at [facility], [address]  [place], [place] [address]    Special Requests   Final    NONE Performed at [facility], [address], [place], [place] [address]    Gram Stain   Final    WBC PRESENT,BOTH PMN AND MONONUCLEAR NO ORGANISMS SEEN    Culture   Final    NO GROWTH 3 DAYS Performed at [facility] [place] [facility], [address], [place], [place] [address]    Report Status 09/30/2023 FINAL  Final  MRSA Next Gen by PCR, Nasal     Status: None   Collection Time: 09/26/23  7:00 PM  Result Value Ref Range Status   MRSA by PCR Next Gen NOT DETECTED NOT DETECTED Final    Comment: (NOTE) The GeneXpert MRSA Assay (FDA approved for NASAL specimens only), is one component of a comprehensive MRSA colonization surveillance program. It is not intended to diagnose MRSA infection nor to guide or monitor treatment for MRSA infections. Test performance is not FDA approved in patients less than 42 years old. Performed at [facility], [address], [place], [place] [address]   Culture, blood (Routine X 2) w Reflex to ID Panel     Status: None   Collection Time: 09/26/23  7:27 PM   Specimen: BLOOD RIGHT HAND  Result Value Ref Range Status   Specimen Description BLOOD RIGHT HAND  Final   Special Requests   Final    BOTTLES DRAWN AEROBIC AND ANAEROBIC Blood Culture results may not be optimal due to an inadequate volume of blood received in culture bottles   Culture   Final    NO GROWTH 5 DAYS Performed at [facility], [address] [place], [place] [address]    Report Status 10/01/2023 FINAL  Final  Culture, blood (Routine X 2) w Reflex to ID Panel     Status: Abnormal   Collection Time: 09/26/23  7:46 PM   Specimen: BLOOD LEFT ARM  Result Value Ref Range Status   Specimen Description BLOOD LEFT ARM  Final   Special Requests   Final    BOTTLES DRAWN AEROBIC AND ANAEROBIC Blood Culture results may not be optimal due to an inadequate volume of blood received in culture bottles   Culture   Setup Time   Final    GRAM POSITIVE RODS AEROBIC BOTTLE ONLY CRITICAL RESULT CALLED TO, READ BACK BY AND VERIFIED WITH: PHARMD JIMMY WYLAND ON 09/28/23 @ 2301 BY DRT    Culture (A)  Final    DIPHTHEROIDS(CORYNEBACTERIUM SPECIES) Standardized susceptibility testing for this organism is not available. Performed at [facility], [address] [place], [place] [address]    Report Status 10/03/2023 FINAL  Final  Respiratory (~20 pathogens) panel by PCR     Status: None   Collection Time: 09/27/23  8:59 AM   Specimen: Nasopharyngeal Swab; Respiratory  Result Value Ref Range Status   Adenovirus NOT DETECTED NOT DETECTED Final   Coronavirus 229E NOT DETECTED NOT DETECTED Final    Comment: (NOTE) The Coronavirus on the Respiratory Panel, DOES NOT test for the novel  Coronavirus (2019 nCoV)    Coronavirus HKU1 NOT DETECTED NOT DETECTED Final   Coronavirus NL63 NOT DETECTED NOT DETECTED Final   Coronavirus OC43 NOT DETECTED NOT DETECTED Final   Metapneumovirus NOT DETECTED NOT DETECTED Final   Rhinovirus / Enterovirus NOT DETECTED NOT DETECTED Final   Influenza A NOT DETECTED NOT DETECTED Final   Influenza B NOT DETECTED NOT DETECTED Final   Parainfluenza Virus 1 NOT DETECTED NOT DETECTED Final   Parainfluenza Virus 2 NOT DETECTED NOT DETECTED Final   Parainfluenza Virus 3 NOT DETECTED NOT DETECTED Final   Parainfluenza Virus 4 NOT DETECTED NOT DETECTED Final   Respiratory Syncytial Virus NOT DETECTED NOT DETECTED Final   Bordetella pertussis NOT DETECTED NOT DETECTED Final   Bordetella Parapertussis NOT DETECTED NOT DETECTED Final   Chlamydophila pneumoniae NOT DETECTED NOT DETECTED Final   Mycoplasma pneumoniae NOT DETECTED NOT DETECTED Final    Comment: Performed at [facility], [address], [place], [place] [address]         Radiology Studies: DG Chest Port 1 View Result Date: 10/03/2023 CLINICAL DATA:  [id number] Respiratory failure [facility]) [phone] EXAM: PORTABLE CHEST 1  VIEW COMPARISON:  Chest x-ray 10/01/2023, CT abdomen pelvis 02/04/2013 FINDINGS: Right chest tube pigtail catheter overlies the right lateral mid hemithorax. Associated subcutaneus soft tissue emphysema. The heart and mediastinal contours are unchanged. Atherosclerotic plaque. Round density overlying the lower mediastinal on the left consistent with known hiatal hernia. Right base atelectasis. No definite focal consolidation. No pulmonary edema. No pleural effusion. Residual at least trace volume stable right pneumothorax. No left pneumothorax. No acute osseous abnormality. IMPRESSION: 1. Residual at least trace volume stable right pneumothorax. 2. Right chest tube pigtail catheter overlies the right lateral mid hemithorax. 3. Hiatal hernia. Electronically Signed   By: Morgane  Naveau M.D.   On: 10/03/2023 09:48        Scheduled Meds:  aspirin  EC  81 mg Oral Q1200   heparin   5,000 Units Subcutaneous Q8H   methIMAzole   20 mg Oral Daily   pantoprazole   40 mg Oral Daily   simvastatin   20 mg Oral QHS   Continuous Infusions:     LOS: 6 days    Time spent: 53 minutes spent on chart review, discussion with nursing staff, consultants, updating family and interview/physical exam; more than 50% of that time was spent in counseling and/or coordination of care.    Camellia PARAS Adessa Primiano, DO [facility] Available via Epic secure chat 7am-7pm After these hours, please refer to coverage provider listed on [email or web address] 10/03/2023, 11:31 AM

## 2023-10-03 NOTE — Progress Notes (Signed)
 Chest tube removed per order, pt tolerated well VSS BP 111/72   Pulse 99   Temp 97.9 F (36.6 C) (Oral)   Resp 18   Ht 5\' 6"  (1.676 m)   Wt 81.6 kg   SpO2 99%   BMI 29.04 kg/m

## 2023-10-03 NOTE — Progress Notes (Signed)
 PT Cancellation Note  Patient Details Name: Mechell Girgis MRN: [id number] DOB: 1945-10-25   Cancelled Treatment:    Reason Eval/Treat Not Completed: Other (comment) discussed case with RN, awaiting CXR/results there of from chest tube being pulled this morning prior to PT. Will return if time/schedule allow   Josette Rough, PT, DPT 10/03/23 11:42 AM

## 2023-10-03 NOTE — TOC CM/SW Note (Signed)
 Transition of Care [facility]) - Inpatient Brief Assessment   Patient Details  Name: Amanda Potter MRN: [id number] Date of Birth: 1946-01-24  Transition of Care [facility]) CM/SW Contact:    Charlann Rayfield Hurst, RN Phone Number: 10/03/2023, 3:26 PM   Clinical Narrative: Admitted w/ pleural effusion/PNTX with chest tube placed-  Chest tube removed today. No recommendations noted for [facility] or DME needs.  Plan to return home pending medical readiness.    Transition of Care Asessment: Insurance and Status: Insurance coverage has been reviewed Patient has primary care physician: Yes Home environment has been reviewed: home w/ son Prior level of function:: independent Prior/Current Home Services: No current home services Social Drivers of Health Review: SDOH reviewed no interventions necessary Readmission risk has been reviewed: Yes Transition of care needs: no transition of care needs at this time

## 2023-10-03 NOTE — Plan of Care (Signed)
   Problem: Clinical Measurements: Goal: Will remain free from infection Outcome: Progressing Goal: Diagnostic test results will improve Outcome: Progressing

## 2023-10-03 NOTE — Progress Notes (Signed)
 Mobility Specialist Progress Note:   10/03/23 1425  Mobility  Activity Ambulated with assistance in hallway  Level of Assistance Standby assist, set-up cues, supervision of patient - no hands on  Assistive Device Front wheel walker;None  Distance Ambulated (ft) 400 ft  Activity Response Tolerated well  Mobility Referral Yes  Mobility visit 1 Mobility  Mobility Specialist Start Time (ACUTE ONLY) 1415  Mobility Specialist Stop Time (ACUTE ONLY) 1424  Mobility Specialist Time Calculation (min) (ACUTE ONLY) 9 min   Pt received in bed, agreeable to mobility. Pt denied any discomfort during session, asx throughout. Ambulated short distance in room w/o RW or hands on assistance. Pt left in bed with call bell in reach and all needs met.  Brown Husband  Mobility Specialist Please contact via [employer] office at [phone]

## 2023-10-03 NOTE — Progress Notes (Addendum)
   NAME:  Amanda Potter, MRN:  [id number], DOB:  08-31-1946, LOS: 6 ADMISSION DATE:  09/26/2023, CONSULTATION DATE: 09/26/2023 REFERRING MD: [place], MD, CHIEF COMPLAINT: Pneumonia, pleural effusion  History of Present Illness:   78 year old with history of GERD, hyperlipidemia, hypertension, sinusitis presenting with progressive cough, shortness of breath.  Symptoms started about 3 weeks ago with a sinus infection which proceeded to cough and dyspnea.  Was given doxycycline , Decadron  and prednisone taper and albuterol  inhaler by primary care as an outpatient.  Complains of worsening dyspnea on Daylani Deblois exertion, inability to lay flat.  Found to have a large right effusion on chest x-ray.  Chest tube placed by emergency room and transferred to [facility] for further management  Pertinent  Medical History    has a past medical history of Deviated nasal septum, Environmental allergies, GERD (gastroesophageal reflux disease), Hyperlipidemia, Hypertension, Perforation of colon (HCC) (2006), and Sinusitis, acute.   Significant Hospital Events: Including procedures, antibiotic start and stop dates in addition to other pertinent events   1/1 admitted with acute hypoxic respiratory failure in the setting of pneumonia and large right pleural effusion.  Right-sided thoracostomy tube placed in the emergency room fluid consistent with exudate post insertion chest x-ray worrisome for lung entrapment 1/2 oxygen requirements improved, pain controlled, lung aeration improved with decreased pneumothorax (really only minimal apical involvement now).  Added RVP, awaiting pleural culture and cytology, added Toradol  for pain, getting CT chest 1/4 No issues overnight, in current drainage system, low air leak (level 1) on am assessment   1/6-lateral pneumothorax stable 10/03/2023 chest tube removed 10/03/23 pleural fluid reveals malignant cells consistent with adenocarcinoma  Interim History / Subjective:  1/8/20245  right chest tube removed Blood pressure 96/61, pulse 100, temperature 97.9 F (36.6 C), temperature source Oral, resp. rate 18, height 5' 6 (1.676 m), weight 81.6 kg, SpO2 98%.        Intake/Output Summary (Last 24 hours) at 10/03/2023 0901 Last data filed at 10/03/2023 9383 Gross per 24 hour  Intake --  Output 110 ml  Net -110 ml   Filed Weights   09/26/23 1021  Weight: 81.6 kg    Examination: Elderly female in no distress reports feeling better No JVD lymphadenopathy is appreciated Chest tube to underwater seal without airleak even with coughing Heart sounds are regular Abdomen soft nontender Extremities warm and dry  Pleural fluid studies - exudative, lymphocyte predominant  Resolved Hospital Problem list     Assessment & Plan:   Acute hypoxic respiratory  Possible CAP Right parapneumonic effusion Suspect pneuomothorax ex-vacuo -Pct neg, ustrep neg, flu and covid neg, bnp unremarkable, rvp neg P: DC chest tube 10/03/2023 Follow-up chest x-ray in 1 hour Chest x-ray in a.m. Delayed cytology from right pleural effusion demonstrates malignant fluid with adenocarcinoma Will need oncology follow-up.    Best Practice (right click and Reselect all SmartList Selections daily)   Per primary  Marcey Yanni Quiroa ACNP Acute Care Nurse Practitioner [facility]/Critical [facility] Please consult Amion 10/03/2023, 9:01 AM

## 2023-10-04 ENCOUNTER — Other Ambulatory Visit (HOSPITAL_COMMUNITY): Payer: Self-pay

## 2023-10-04 ENCOUNTER — Inpatient Hospital Stay (HOSPITAL_COMMUNITY): Payer: Medicare Other

## 2023-10-04 DIAGNOSIS — J90 Pleural effusion, not elsewhere classified: Secondary | ICD-10-CM | POA: Diagnosis not present

## 2023-10-04 LAB — CEA: CEA: 3.2 ng/mL (ref 0.0–4.7)

## 2023-10-04 LAB — AFP TUMOR MARKER: AFP, Serum, Tumor Marker: 2.5 ng/mL (ref 0.0–9.2)

## 2023-10-04 LAB — CANCER ANTIGEN 19-9: CA 19-9: 442 U/mL — ABNORMAL HIGH (ref 0–35)

## 2023-10-04 LAB — CA 125: Cancer Antigen (CA) 125: 23.3 U/mL (ref 0.0–38.1)

## 2023-10-04 MED ORDER — METHIMAZOLE 10 MG PO TABS
20.0000 mg | ORAL_TABLET | Freq: Every day | ORAL | 0 refills | Status: DC
  Filled 2023-10-04: qty 180, 90d supply, fill #0

## 2023-10-04 NOTE — Discharge Summary (Signed)
 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  Home Health: No needs identified by therapy inpatient Equipment/Devices: None  Discharge Condition: Stable CODE STATUS: Full code Diet recommendation: Heart healthy diet  History of present illness:  Lavada Langsam is a 78 y.o. female with past medical history significant for HTN, GERD, dyslipidemia, recurrent sinusitis, perforated colon following colonoscopy who presented to [facility] ED on 09/25/2022 progressive shortness of breath, cough, congestion over the past 3-4 weeks.  Initially was reporting left sided maxillary sinus fullness/redness and saw her PCP and was given a 10-day course of oral antibiotics and steroid taper.  She reports her symptoms improved however 7-8 days ago she started experiencing progressive shortness of breath, chest tightness.  Worse with minimal exertion and laying flat.   In the ED, temperature 97.7 F, HR 109, RR 26, BP 126/62, SpO2 94% on room air.  WBC 10.6, hemoglobin 13.0, platelet count 370.  Sodium 127, potassium 2.4,  chloride 85, CO2 33, glucose 130, BUN 9, creatinine 0.60.  AST 12, ALT 7, total bilirubin 1.7.  BNP 16.4.  COVID/influenza/RSV PCR negative.  Chest x-ray with right base collapse/consolidation of large right pleural effusion, with concern of neoplasm given unilateral disease.  Chest tube was placed by ED physician at [facility].  PCCM consulted and patient was transferred to [facility] on the hospital service for further evaluation and management of large right-sided pleural effusion and community-acquired pneumonia.  Hospital course:  Acute hypoxic respiratory failure: Resolved Malignant right pleural effusion; adenocarcinoma Community-acquired pneumonia Patient presents to ED with progressive shortness of breath.  Chest x-ray with large right-sided pleural effusion.  Chest tube placed by ED physician on 09/26/2023.  Pulmonology was consulted and followed during hospital course.  COVID/RSV/influenza PCR negative.  Respiratory viral panel negative.  MRSA PCR negative.  Pleural fluid analysis consistent with exudative effusion, possibly from underlying pneumonia but also concern for malignancy. 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  Completed course of antibiotics with doxycycline  and Augmentin .  Blood cultures x 2 and pleural fluid culture showed no growth during hospitalization.  Chest tube was removed on 10/03/2023.  CT chest/abdomen/pelvis for workup of malignancy unrevealing.  Follow-up CEA, CA 19-9, AFP, CA125 that was pending at time of discharge.  Referral placed to pulmonology and medical oncology outpatient.  Outpatient follow-up with PCP 1 week.    Hyperthyroidism TSH low, T3 normal but FT4 elevated.  Thyroid -stimulating immunoglobulin less then 0.10.  Patient asymptomatic. Continue new methimazole  20mg  PO daily. Outpatient referral placed to endocrinology for further management.    Hyponatremia Sodium 127 on admission.  Serum osmolality 274 (low), urine sodium less than 20, urine osmolality 148 (low).  TSH slightly low at 0.301.  Sodium improved to 137 at  time of discharge.  Recommend repeat BMP 1 week.   Hypokalemia Repleted during hospitalization, potassium improved to 4.5 at time of discharge.   Hypochloremia Chloride 85 on admission, likely secondary to poor oral intake/dehydration.  Supported with IV fluid hydration with improvement of chloride to 105 at time of discharge.   Essential hypertension On lisinopril 10 mg p.o. daily and amlodipine  10 mg PO daily outpatient.  Blood pressures have been borderline low at time of admission.  TTE with LVEF 65-70%, no LV regional wall motion abnormalities, grade 1 diastolic dysfunction, LA/RA mildly dilated, IVC normal in size, no aortic stenosis.  Home lisinopril and amlodipine  were discontinued at time of discharge.  Outpatient follow-up with PCP.   Dyslipidemia Zocor  20 mg p.o. daily   GERD Continue omeprazole.    Discharge Diagnoses:  Principal Problem:   Pleural effusion on right Active Problems:   Benign hypertension   Pure hypertriglyceridemia   Gastro-esophageal reflux disease with esophagitis   Malignant pleural effusion    Discharge Instructions  Discharge Instructions     Ambulatory referral to Endocrinology   Complete by: As directed    Ambulatory referral to Hematology / Oncology   Complete by: As directed    Ambulatory referral to Pulmonology   Complete by: As directed    Reason for referral: Pleural Effusion   Call MD for:  difficulty breathing, headache or visual disturbances   Complete by: As directed    Call MD for:  extreme fatigue   Complete by: As directed    Call MD for:  persistant dizziness or light-headedness   Complete by: As directed    Call MD for:  persistant nausea and vomiting   Complete by: As directed    Call MD for:  severe uncontrolled pain   Complete by: As  directed    Call MD for:  temperature >100.4   Complete by: As directed    Diet - low sodium heart healthy   Complete by: As directed    Increase activity slowly   Complete by: As directed       Allergies as of 10/04/2023       Reactions   Tape Rash   Glue on tape        Medication List     STOP taking these medications    amLODipine  10 MG tablet Commonly known as: NORVASC    lisinopril 10 MG tablet Commonly known as: ZESTRIL       TAKE these medications    albuterol  108 (90 Base) MCG/ACT inhaler Commonly known as: VENTOLIN  HFA Inhale 1 puff into the lungs every 6 (six) hours as needed for shortness of breath.   aspirin  81 MG tablet Take 81 mg by mouth daily at 12 noon.   Cholecalciferol 1.25 MG (50000 UT) capsule Take 50,000 Units by mouth daily.   diphenhydrAMINE  25 MG tablet Commonly known as: BENADRYL  Take 25 mg by mouth daily as needed for allergies.   fluticasone  50 MCG/ACT nasal spray Commonly known as: FLONASE  Place 1 spray into both nostrils daily as needed for allergies or rhinitis.   methimazole  10 MG tablet Commonly known as: TAPAZOLE  Take 2 tablets (20 mg total) by mouth daily.   naproxen sodium 220 MG tablet Commonly known as: ALEVE Take 220 mg by mouth 2 (two) times daily as needed (for pain).   omeprazole 20 MG capsule Commonly known as: PRILOSEC Take 20 mg by mouth daily.   REFRESH OP Place 1 drop into both eyes daily  as needed (dry eyes).   simvastatin  20 MG tablet Commonly known as: ZOCOR  Take 20 mg by mouth at bedtime.   SLOW FE PO Take 1 tablet by mouth daily.        Follow-up Information     Debrah Josette ORN., PA-C. Schedule an appointment as soon as possible for a visit in 1 week(s).   Specialty: Family Medicine Contact information: [address] [place] [address] [phone]         [facility] at [facility]. Schedule an appointment as soon as possible for a visit in 3  week(s).   Specialty: Pulmonology Contact information: [address] [place] [address] [place]  [address] [phone]        [facility] Endocrinology. Schedule an appointment as soon as possible for a visit in 3 week(s).   Specialty: Endocrinology Contact information: [address] [place], [address] [place]  [address] [phone]               Allergies  Allergen Reactions   Tape Rash    Glue on tape    Consultations: PCCM   Procedures/Studies: DG CHEST PORT 1 VIEW Result Date: 10/04/2023 CLINICAL DATA:  Chest tube removal. EXAM: PORTABLE CHEST 1 VIEW COMPARISON:  10/03/2023 and older studies. FINDINGS: Small right apical pneumothorax is stable from the previous day's exam. Right lower lateral hemithorax pneumothorax is less apparent, presume smaller. Opacity at the right lung base is stable. No new lung abnormalities. Stable hiatal hernia. IMPRESSION: 1. Small residual pneumothorax at the right lateral lung base is less apparent and presumed smaller. No change in the small right apical pneumothorax. 2. No other change from the previous day's exam. Electronically Signed   By: Alm Parkins M.D.   On: 10/04/2023 07:32   CT CHEST ABDOMEN PELVIS W CONTRAST Result Date: 10/03/2023 CLINICAL DATA:  Metastatic disease evaluation. History of perforated colon. EXAM: CT CHEST, ABDOMEN, AND PELVIS WITH CONTRAST TECHNIQUE: Multidetector CT imaging of the chest, abdomen and pelvis was performed following the standard protocol during bolus administration of intravenous contrast. RADIATION DOSE REDUCTION: This exam was performed according to the departmental dose-optimization program which includes automated exposure control, adjustment of the mA and/or kV according to patient size and/or use of iterative reconstruction technique. CONTRAST:  75mL OMNIPAQUE  IOHEXOL  350 MG/ML SOLN COMPARISON:  Chest CT 09/27/2023. Report only CT abdomen and pelvis  02/04/2013. FINDINGS: CT CHEST FINDINGS Cardiovascular: No significant vascular findings. Heart is mildly enlarged. No pericardial effusion. There are atherosclerotic calcifications of the aorta. Mediastinum/Nodes: There subcentimeter hypodense thyroid  nodules bilaterally. There is an enlarged precarinal lymph node measuring 11 mm. No other enlarged mediastinal, hilar or axillary lymph nodes are seen. The visualized esophagus is within normal limits. There is a moderate-sized hiatal hernia containing the proximal stomach. Lungs/Pleura: Right chest tube has been removed. There is a small right-sided hydropneumothorax persisting with multiple air-fluid levels in the pleural space. Pleural fluid has increased from prior. There is mild enhancement of the pleura. There is right lower lobe atelectasis/airspace disease. Bands of opacity in the right upper lobe and right lower lobe are favored is atelectasis. Patchy ground-glass opacities in the right middle lobe have resolved in the interval. The left lung is grossly clear. Trachea and central airways are patent. Musculoskeletal: Twos 1 right chest wall emphysema is present. CT ABDOMEN PELVIS FINDINGS Hepatobiliary: No focal liver abnormality is seen. Status post cholecystectomy. No biliary dilatation. Pancreas: Unremarkable. No pancreatic ductal dilatation or  surrounding inflammatory changes. Spleen: Normal in size without focal abnormality. Adrenals/Urinary Tract: Adrenal glands are unremarkable. Kidneys are normal, without renal calculi, focal lesion, or hydronephrosis. Bladder is unremarkable. Stomach/Bowel: There is a large hiatal hernia containing the proximal stomach. No evidence of bowel wall thickening, distention, or inflammatory changes. There is sigmoid and descending colon diverticulosis. The appendix is surgically absent. Vascular/Lymphatic: Aortic atherosclerosis. No enlarged abdominal or pelvic lymph nodes. Reproductive: Partially calcified uterine fibroids  are present measuring up to 10 mm. Adnexa are within normal limits. Other: Patient is status post anterior abdominal wall hernia repair with mesh. Mild hernia persists containing nondilated small bowel loops. There is no ascites. Musculoskeletal: No acute or significant osseous findings. IMPRESSION: 1. Interval removal of right chest tube. Small right-sided hydropneumothorax persists with multiple air-fluid levels in the pleural space. Pleural fluid has increased from prior. There is mild enhancement of the pleura. Empyema not excluded in the appropriate clinical setting. 2. Right lower lobe atelectasis/airspace disease. 3. Mildly enlarged precarinal lymph node. 4. No acute localizing process in the abdomen or pelvis. 5. Large hiatal hernia containing the proximal stomach. 6. Colonic diverticulosis. 7. Uterine fibroids. 8. Aortic atherosclerosis. Aortic Atherosclerosis (ICD10-I70.0). Electronically Signed   By: Greig Pique M.D.   On: 10/03/2023 17:50   DG CHEST PORT 1 VIEW Result Date: 10/03/2023 CLINICAL DATA:  Chest tube removal EXAM: PORTABLE CHEST 1 VIEW COMPARISON:  10/03/2023, 10/01/2023 FINDINGS: Interim removal of right-sided chest tube. Small residual right apical and CP angle pneumothorax. Mild atelectasis at right base. Hiatal hernia. Small volume gas in the right chest wall soft tissues. Stable cardiomediastinal silhouette with aortic atherosclerosis. IMPRESSION: Interim removal of right-sided chest tube with stable small residual right apical and CP angle pneumothorax. Subsegmental atelectasis at the right base Electronically Signed   By: Luke Bun M.D.   On: 10/03/2023 15:46   DG Chest Port 1 View Result Date: 10/03/2023 CLINICAL DATA:  [id number] Respiratory failure (HCC) [phone] EXAM: PORTABLE CHEST 1 VIEW COMPARISON:  Chest x-ray 10/01/2023, CT abdomen pelvis 02/04/2013 FINDINGS: Right chest tube pigtail catheter overlies the right lateral mid hemithorax. Associated subcutaneus soft tissue  emphysema. The heart and mediastinal contours are unchanged. Atherosclerotic plaque. Round density overlying the lower mediastinal on the left consistent with known hiatal hernia. Right base atelectasis. No definite focal consolidation. No pulmonary edema. No pleural effusion. Residual at least trace volume stable right pneumothorax. No left pneumothorax. No acute osseous abnormality. IMPRESSION: 1. Residual at least trace volume stable right pneumothorax. 2. Right chest tube pigtail catheter overlies the right lateral mid hemithorax. 3. Hiatal hernia. Electronically Signed   By: Morgane  Naveau M.D.   On: 10/03/2023 09:48   DG Chest Port 1 View Result Date: 10/02/2023 CLINICAL DATA:  Shortness of breath, follow-up pneumothorax EXAM: PORTABLE CHEST 1 VIEW COMPARISON:  09/27/2023 FINDINGS: Cardiac shadow is enlarged but stable. Aortic calcifications are again seen. Basilar airspace opacity is noted on the right. No sizable effusion or pneumothorax is seen. Pigtail catheter is seen on the right although it has been slightly withdrawn when compared with the previous day. IMPRESSION: No pneumothorax is noted. Persistent right basilar airspace opacity is noted. It should be noted this study was dictated on 10/02/2023 despite being obtained on 09/28/2023. This was due to a mechanical failure in the portable machine. Electronically Signed   By: Oneil Devonshire M.D.   On: 10/02/2023 20:42   DG CHEST PORT 1 VIEW Result Date: 10/01/2023 CLINICAL DATA:  Chest tube EXAM: PORTABLE CHEST -  1 VIEW COMPARISON:  the previous day's study FINDINGS: Stable right lateral pigtail chest tube, with small adjacent residual lateral pneumothorax. minimal regional subcutaneous emphysema. Some patchy airspace opacities laterally at the right lung base, and in the infrahilar regions left greater than right. Heart size and mediastinal contours are within normal limits. Aortic Atherosclerosis (ICD10-170.0). No effusion. Bilateral shoulder DJD.   Cholecystectomy clips. IMPRESSION: Stable right chest tube with tiny lateral residual pneumothorax. Electronically Signed   By: JONETTA Faes M.D.   On: 10/01/2023 10:24   DG CHEST PORT 1 VIEW Result Date: 09/30/2023 CLINICAL DATA:  Follow-up pneumothorax. Pleural effusion. Pneumonia. EXAM: PORTABLE CHEST 1 VIEW COMPARISON:  09/29/2023 FINDINGS: There is a right basilar pigtail thoracostomy tube in similar position to the previous exam. Tiny right apical pneumothorax is visualized on the current exam measuring approximately 6 mm over the apex. Improved right mid and right lower lung opacities. No new findings. IMPRESSION: 1. Stable position of right-sided chest tube. Tiny right apical pneumothorax measuring approximately 6 mm over the apex. 2. Improved right mid and right lower lung opacities. Electronically Signed   By: Waddell Calk M.D.   On: 09/30/2023 09:47   DG CHEST PORT 1 VIEW Result Date: 09/29/2023 CLINICAL DATA:  Follow-up chest tube. EXAM: PORTABLE CHEST 1 VIEW COMPARISON:  09/27/2023 FINDINGS: Unchanged position of right lateral basilar pigtail thoracostomy tube. No pneumothorax identified. Heart size and mediastinal contours are stable. Aortic atherosclerosis. Persistent opacities within the right midlung and right lower lung which appear unchanged in the interval. Left lung appears clear. IMPRESSION: 1. Stable position of right lateral basilar pigtail thoracostomy tube. No pneumothorax identified. 2. Persistent opacities within the right midlung and right lower lung. Electronically Signed   By: Waddell Calk M.D.   On: 09/29/2023 10:50   ECHOCARDIOGRAM COMPLETE Result Date: 09/27/2023    ECHOCARDIOGRAM REPORT   Patient Name:   MEERA VASCO Date of Exam: 09/27/2023 Medical Rec #:  [id number]      Height:       66.0 in Accession #:    [id number]     Weight:       179.9 lb Date of Birth:  [birth year]      BSA:          1.912 m Patient Age:    61 years       BP:           106/71 mmHg Patient Gender: F               HR:           106 bpm. Exam Location:  Inpatient Procedure: 2D Echo, Cardiac Doppler, Color Doppler and Intracardiac            Opacification Agent Indications:    CHF- Acute Diastolic  History:        Patient has prior history of Echocardiogram examinations.                 Arrythmias:Tachycardia; Risk Factors:Dyslipidemia and                 Hypertension.  Sonographer:    Ozell Free Referring Phys: JACQUELINE LAVADA [facility]  Sonographer Comments: Technically difficult study due to poor echo windows. Image acquisition challenging due to patient body habitus. IMPRESSIONS  1. Left ventricular ejection fraction, by estimation, is 65 to 70%. The left ventricle has hyperdynamic function. The left ventricle has no regional wall motion abnormalities. Left ventricular diastolic parameters are consistent with Grade  I diastolic dysfunction (impaired relaxation).  2. Right ventricular systolic function is normal. The right ventricular size is normal. There is mildly elevated pulmonary artery systolic pressure. The estimated right ventricular systolic pressure is 39.4 mmHg.  3. Left atrial size was mildly dilated.  4. Right atrial size was mildly dilated.  5. The mitral valve is normal in structure. No evidence of mitral valve regurgitation. No evidence of mitral stenosis.  6. The aortic valve is tricuspid. There is mild calcification of the aortic valve. Aortic valve regurgitation is not visualized. No aortic stenosis is present.  7. The inferior vena cava is normal in size with <50% respiratory variability, suggesting right atrial pressure of 8 mmHg. FINDINGS  Left Ventricle: Left ventricular ejection fraction, by estimation, is 65 to 70%. The left ventricle has hyperdynamic function. The left ventricle has no regional wall motion abnormalities. Definity  contrast agent was given IV to delineate the left ventricular endocardial borders. The left ventricular internal cavity size was normal in size. There is no left  ventricular hypertrophy. Left ventricular diastolic parameters are consistent with Grade I diastolic dysfunction (impaired relaxation). Right Ventricle: The right ventricular size is normal. No increase in right ventricular wall thickness. Right ventricular systolic function is normal. There is mildly elevated pulmonary artery systolic pressure. The tricuspid regurgitant velocity is 2.80  m/s, and with an assumed right atrial pressure of 8 mmHg, the estimated right ventricular systolic pressure is 39.4 mmHg. Left Atrium: Left atrial size was mildly dilated. Right Atrium: Right atrial size was mildly dilated. Pericardium: There is no evidence of pericardial effusion. Mitral Valve: The mitral valve is normal in structure. There is mild calcification of the mitral valve leaflet(s). Mild mitral annular calcification. No evidence of mitral valve regurgitation. No evidence of mitral valve stenosis. Tricuspid Valve: The tricuspid valve is normal in structure. Tricuspid valve regurgitation is trivial. Aortic Valve: The aortic valve is tricuspid. There is mild calcification of the aortic valve. Aortic valve regurgitation is not visualized. No aortic stenosis is present. Aortic valve mean gradient measures 6.0 mmHg. Aortic valve peak gradient measures 13.1 mmHg. Aortic valve area, by VTI measures 3.43 cm. Pulmonic Valve: The pulmonic valve was normal in structure. Pulmonic valve regurgitation is not visualized. Aorta: The aortic root is normal in size and structure. Venous: The inferior vena cava is normal in size with less than 50% respiratory variability, suggesting right atrial pressure of 8 mmHg. IAS/Shunts: No atrial level shunt detected by color flow Doppler.  LEFT VENTRICLE PLAX 2D LVIDd:         4.00 cm   Diastology LVIDs:         2.20 cm   LV e' medial:    8.70 cm/s LV PW:         1.00 cm   LV E/e' medial:  8.8 LV IVS:        1.00 cm   LV e' lateral:   8.55 cm/s LVOT diam:     2.00 cm   LV E/e' lateral: 8.9 LV SV:          92 LV SV Index:   48 LVOT Area:     3.14 cm  RIGHT VENTRICLE             IVC RV Basal diam:  3.70 cm     IVC diam: 2.00 cm RV S prime:     20.00 cm/s TAPSE (M-mode): 2.1 cm LEFT ATRIUM  Index        RIGHT ATRIUM           Index LA diam:        3.70 cm 1.94 cm/m   RA Area:     25.50 cm LA Vol (A2C):   35.8 ml 18.73 ml/m  RA Volume:   82.50 ml  43.15 ml/m LA Vol (A4C):   79.2 ml 41.43 ml/m LA Biplane Vol: 54.0 ml 28.25 ml/m  AORTIC VALVE AV Area (Vmax):    3.14 cm AV Area (Vmean):   3.06 cm AV Area (VTI):     3.43 cm AV Vmax:           181.00 cm/s AV Vmean:          118.000 cm/s AV VTI:            0.269 m AV Peak Grad:      13.1 mmHg AV Mean Grad:      6.0 mmHg LVOT Vmax:         181.00 cm/s LVOT Vmean:        115.000 cm/s LVOT VTI:          0.294 m LVOT/AV VTI ratio: 1.09  AORTA Ao Root diam: 3.10 cm MITRAL VALVE                TRICUSPID VALVE MV Area (PHT): 5.20 cm     TR Peak grad:   31.4 mmHg MV Decel Time: 146 msec     TR Vmax:        280.00 cm/s MV E velocity: 76.40 cm/s MV A velocity: 138.00 cm/s  SHUNTS MV E/A ratio:  0.55         Systemic VTI:  0.29 m                             Systemic Diam: 2.00 cm Dalton McleanMD Electronically signed by Ezra Kanner Signature Date/Time: 09/27/2023/3:25:35 PM    Final    CT CHEST WO CONTRAST Result Date: 09/27/2023 CLINICAL DATA:  Pneumonia. EXAM: CT CHEST WITHOUT CONTRAST TECHNIQUE: Multidetector CT imaging of the chest was performed following the standard protocol without IV contrast. RADIATION DOSE REDUCTION: This exam was performed according to the departmental dose-optimization program which includes automated exposure control, adjustment of the mA and/or kV according to patient size and/or use of iterative reconstruction technique. COMPARISON:  Radiograph of same day. FINDINGS: Cardiovascular: Atherosclerosis of thoracic aorta is noted without aneurysm formation. Normal cardiac size. No pericardial effusion. Coronary artery  calcifications are noted. Mediastinum/Nodes: Thyroid  gland is unremarkable. No adenopathy. Moderate size hiatal hernia. Lungs/Pleura: Right-sided chest tube is noted. Moderate size right apical and basilar pneumothorax is noted. Minimal right pleural effusion is noted as well. Airspace opacities are noted in right lung concerning for pneumonia or possibly atelectasis. Bronchiectasis is noted in left lower lobe. Upper Abdomen: No acute abnormality. Musculoskeletal: No chest wall mass or suspicious bone lesions identified. IMPRESSION: Right-sided chest tube is noted with moderate size right apical and basilar pneumothorax with minimal right pleural effusion. Airspace opacities are noted in the right lung concerning for pneumonia or possibly atelectasis. Bronchiectasis is noted in left lower lobe. Moderate size hiatal hernia. Coronary artery calcifications are noted. Aortic Atherosclerosis (ICD10-I70.0). Electronically Signed   By: Lynwood Landy Raddle M.D.   On: 09/27/2023 13:05   DG CHEST PORT 1 VIEW Result Date: 09/27/2023 CLINICAL DATA:  Right-sided chest tube. EXAM: PORTABLE CHEST  1 VIEW COMPARISON:  September 26, 2023. FINDINGS: Stable cardiomediastinal silhouette. Right-sided chest tube is noted with minimal right apical pneumothorax which is improved compared to prior exam, a continued mild right basilar pneumothorax with associated effusion. IMPRESSION: Stable right-sided chest tube with improved left apical pneumothorax, but continued right basilar hydropneumothorax. Electronically Signed   By: Lynwood Landy Raddle M.D.   On: 09/27/2023 09:47   DG Chest Port 1 View Result Date: 09/26/2023 CLINICAL DATA:  [id number] Chest tube in place [id number] EXAM: PORTABLE CHEST 1 VIEW COMPARISON:  Chest radiograph from earlier today. FINDINGS: Stable cardiomediastinal silhouette with normal heart size and moderate hiatal hernia. Right basilar pigtail pleural catheter in place. Persistent moderate right hydropneumothorax with  substantially decreased right pleural effusion component and with new moderate ex vacuo right pneumothorax component. No mediastinal shift. No left pneumothorax. No left pleural effusion. Improved aeration of the right lung with persistent mild to moderate patchy bibasilar lung opacities. No overt pulmonary edema. IMPRESSION: 1. Persistent moderate right hydropneumothorax status post right basilar pigtail pleural catheter placement, with substantially decreased right pleural effusion component and with new moderate ex vacuo right pneumothorax component. No mediastinal shift. 2. Improved aeration of the right lung with persistent mild to moderate patchy bibasilar lung opacities. 3. Chronic moderate hiatal hernia. Electronically Signed   By: Selinda DELENA Blue M.D.   On: 09/26/2023 14:38   DG Chest Port 1 View Result Date: 09/26/2023 CLINICAL DATA:  Shortness of breath. EXAM: PORTABLE CHEST 1 VIEW COMPARISON:  12/10/2017 FINDINGS: Right base collapse/consolidation with large right pleural effusion. Left lung is hyperexpanded but appears clear. Moderate to large hiatal hernia. The cardio pericardial silhouette is enlarged. Bones are diffusely demineralized. Telemetry leads overlie the chest. IMPRESSION: 1. Right base collapse/consolidation with large right pleural effusion. Given the unilateral disease, neoplasm is a concern. Chest CT (with contrast if renal function permits) recommended to further evaluate. 2. Moderate to large hiatal hernia. Electronically Signed   By: Camellia Candle M.D.   On: 09/26/2023 11:50     Subjective: Patient seen examined bedside, lying in bed.  No specific complaints this morning.  Chest tube removed yesterday.  Denies any shortness of breath.  Ready for discharge home.  Discussed with patient will need close follow-up with PCP after discharge.  Discussed referral to endocrinology, pulmonology and oncology for further workup and surveillance given malignant pleural effusion and  hyperthyroidism.  No other specific questions, concerns or complaints at this time.  Denies headache, no visual changes, no dizziness, no chest pain, no palpitations, no shortness of breath, no abdominal pain, no fever/chills/night sweats, no nausea/vomiting/diarrhea, no focal weakness, no fatigue, no paresthesias.  No acute events overnight per nursing staff.  Discharge Exam: Vitals:   10/04/23 0609 10/04/23 0614  BP: 96/63   Pulse: (!) 104 100  Resp: 18   Temp: 97.7 F (36.5 C)   SpO2: 98% 97%   Vitals:   10/03/23 2322 10/04/23 0530 10/04/23 0609 10/04/23 0614  BP: 104/60 103/66 96/63   Pulse: 91 98 (!) 104 100  Resp: 20 18 18    Temp: 97.6 F (36.4 C) 97.6 F (36.4 C) 97.7 F (36.5 C)   TempSrc: Oral Oral Oral   SpO2: 96% 97% 98% 97%  Weight:      Height:        Physical Exam: GEN: NAD, alert and oriented x 3, elderly in appearance HEENT: NCAT, PERRL, EOMI, sclera clear, MMM PULM: Breath sounds slight diminished right base without crackles/wheezing, normal respiratory  effort without accessory muscle use, on room air with SpO2 97% at rest. CV: RRR w/o M/G/R GI: abd soft, NTND, NABS, no R/G/M MSK: no peripheral edema, muscle strength globally intact 5/5 bilateral upper/lower extremities NEURO: CN II-XII intact, no focal deficits, sensation to light touch intact PSYCH: normal mood/affect Integumentary: dry/intact, no rashes or wounds    The results of significant diagnostics from this hospitalization (including imaging, microbiology, ancillary and laboratory) are listed below for reference.     Microbiology: Recent Results (from the past 240 hours)  Resp panel by RT-PCR (RSV, Flu A&B, Covid) Anterior Nasal Swab     Status: None   Collection Time: 09/26/23 11:39 AM   Specimen: Anterior Nasal Swab  Result Value Ref Range Status   SARS Coronavirus 2 by RT PCR NEGATIVE NEGATIVE Final    Comment: (NOTE) SARS-CoV-2 target nucleic acids are NOT DETECTED.  The SARS-CoV-2  RNA is generally detectable in upper respiratory specimens during the acute phase of infection. The lowest concentration of SARS-CoV-2 viral copies this assay can detect is 138 copies/mL. A negative result does not preclude SARS-Cov-2 infection and should not be used as the sole basis for treatment or other patient management decisions. A negative result may occur with  improper specimen collection/handling, submission of specimen other than nasopharyngeal swab, presence of viral mutation(s) within the areas targeted by this assay, and inadequate number of viral copies(<138 copies/mL). A negative result must be combined with clinical observations, patient history, and epidemiological information. The expected result is Negative.  Fact Sheet for Patients:  bloggercourse.com  Fact Sheet for Healthcare Providers:  seriousbroker.it  This test is no t yet approved or cleared by the [place]  FDA and  has been authorized for detection and/or diagnosis of SARS-CoV-2 by FDA under an Emergency Use Authorization (EUA). This EUA will remain  in effect (meaning this test can be used) for the duration of the COVID-19 declaration under Section 564(b)(1) of the Act, 21 U.S.C.section 360bbb-3(b)(1), unless the authorization is terminated  or revoked sooner.       Influenza A by PCR NEGATIVE NEGATIVE Final   Influenza B by PCR NEGATIVE NEGATIVE Final    Comment: (NOTE) The Xpert Xpress SARS-CoV-2/FLU/RSV plus assay is intended as an aid in the diagnosis of influenza from Nasopharyngeal swab specimens and should not be used as a sole basis for treatment. Nasal washings and aspirates are unacceptable for Xpert Xpress SARS-CoV-2/FLU/RSV testing.  Fact Sheet for Patients: bloggercourse.com  Fact Sheet for Healthcare Providers: seriousbroker.it  This test is not yet approved or cleared by the  [place]  FDA and has been authorized for detection and/or diagnosis of SARS-CoV-2 by FDA under an Emergency Use Authorization (EUA). This EUA will remain in effect (meaning this test can be used) for the duration of the COVID-19 declaration under Section 564(b)(1) of the Act, 21 U.S.C. section 360bbb-3(b)(1), unless the authorization is terminated or revoked.     Resp Syncytial Virus by PCR NEGATIVE NEGATIVE Final    Comment: (NOTE) Fact Sheet for Patients: bloggercourse.com  Fact Sheet for Healthcare Providers: seriousbroker.it  This test is not yet approved or cleared by the [place]  FDA and has been authorized for detection and/or diagnosis of SARS-CoV-2 by FDA under an Emergency Use Authorization (EUA). This EUA will remain in effect (meaning this test can be used) for the duration of the COVID-19 declaration under Section 564(b)(1) of the Act, 21 U.S.C. section 360bbb-3(b)(1), unless the authorization is terminated or revoked.  Performed at [facility], [address] [place], [place] [address]   Body fluid culture w Gram Stain     Status: None   Collection Time: 09/26/23  1:22 PM   Specimen: Pleural Fluid  Result Value Ref Range Status   Specimen Description   Final    PLEURAL RIGHT Performed at [facility], [address] [place], [place] [address]    Special Requests   Final    NONE Performed at [facility], [address] [place], [place] [address]    Gram Stain   Final    WBC PRESENT,BOTH PMN AND MONONUCLEAR NO ORGANISMS SEEN    Culture   Final    NO GROWTH 3 DAYS Performed at [facility], [address], [place], [place] [address]    Report Status 09/30/2023 FINAL  Final  MRSA Next Gen by PCR, Nasal     Status: None   Collection Time: 09/26/23  7:00 PM  Result Value Ref Range Status   MRSA by PCR Next Gen NOT DETECTED NOT DETECTED Final     Comment: (NOTE) The GeneXpert MRSA Assay (FDA approved for NASAL specimens only), is one component of a comprehensive MRSA colonization surveillance program. It is not intended to diagnose MRSA infection nor to guide or monitor treatment for MRSA infections. Test performance is not FDA approved in patients less than 51 years old. Performed at [facility], [address], [place], [place] [address]   Culture, blood (Routine X 2) w Reflex to ID Panel     Status: None   Collection Time: 09/26/23  7:27 PM   Specimen: BLOOD RIGHT HAND  Result Value Ref Range Status   Specimen Description BLOOD RIGHT HAND  Final   Special Requests   Final    BOTTLES DRAWN AEROBIC AND ANAEROBIC Blood Culture results may not be optimal due to an inadequate volume of blood received in culture bottles   Culture   Final    NO GROWTH 5 DAYS Performed at [facility], [address], [place], [place] [address]    Report Status 10/01/2023 FINAL  Final  Culture, blood (Routine X 2) w Reflex to ID Panel     Status: Abnormal   Collection Time: 09/26/23  7:46 PM   Specimen: BLOOD LEFT ARM  Result Value Ref Range Status   Specimen Description BLOOD LEFT ARM  Final   Special Requests   Final    BOTTLES DRAWN AEROBIC AND ANAEROBIC Blood Culture results may not be optimal due to an inadequate volume of blood received in culture bottles   Culture  Setup Time   Final    GRAM POSITIVE RODS AEROBIC BOTTLE ONLY CRITICAL RESULT CALLED TO, READ BACK BY AND VERIFIED WITH: PHARMD JIMMY WYLAND ON 09/28/23 @ 2301 BY DRT    Culture (A)  Final    DIPHTHEROIDS(CORYNEBACTERIUM SPECIES) Standardized susceptibility testing for this organism is not available. Performed at [facility], [address], [place], [place] [address]    Report Status 10/03/2023 FINAL  Final  Respiratory (~20 pathogens) panel by PCR     Status: None   Collection Time: 09/27/23  8:59 AM   Specimen: Nasopharyngeal Swab; Respiratory   Result Value Ref Range Status   Adenovirus NOT DETECTED NOT DETECTED Final   Coronavirus 229E NOT DETECTED NOT DETECTED Final    Comment: (NOTE) The Coronavirus on the Respiratory Panel, DOES NOT test for the novel  Coronavirus (2019 nCoV)  Coronavirus HKU1 NOT DETECTED NOT DETECTED Final   Coronavirus NL63 NOT DETECTED NOT DETECTED Final   Coronavirus OC43 NOT DETECTED NOT DETECTED Final   Metapneumovirus NOT DETECTED NOT DETECTED Final   Rhinovirus / Enterovirus NOT DETECTED NOT DETECTED Final   Influenza A NOT DETECTED NOT DETECTED Final   Influenza B NOT DETECTED NOT DETECTED Final   Parainfluenza Virus 1 NOT DETECTED NOT DETECTED Final   Parainfluenza Virus 2 NOT DETECTED NOT DETECTED Final   Parainfluenza Virus 3 NOT DETECTED NOT DETECTED Final   Parainfluenza Virus 4 NOT DETECTED NOT DETECTED Final   Respiratory Syncytial Virus NOT DETECTED NOT DETECTED Final   Bordetella pertussis NOT DETECTED NOT DETECTED Final   Bordetella Parapertussis NOT DETECTED NOT DETECTED Final   Chlamydophila pneumoniae NOT DETECTED NOT DETECTED Final   Mycoplasma pneumoniae NOT DETECTED NOT DETECTED Final    Comment: Performed at [facility], [address], [place], [place] [address]     Labs: BNP (last 3 results) Recent Labs    09/28/23 0350 09/29/23 0247 09/30/23 0305  BNP 64.7 64.1 37.5   Basic Metabolic Panel: Recent Labs  Lab 09/28/23 0350 09/29/23 0247 09/30/23 0305 10/01/23 0239 10/02/23 0315 10/03/23 0341  NA 136 136 137 136 135 137  K 3.5 3.4* 4.0 3.7 3.4* 4.5  CL 99 98 98 99 97* 101  CO2 30 29 29 26 26 29   GLUCOSE 95 95 111* 110* 111* 109*  BUN 13 9 7* 7* 10 9  CREATININE 0.71 0.56 0.67 0.70 0.73 0.86  CALCIUM 8.3* 8.3* 8.5* 8.6* 8.9 9.1  MG 2.0 1.7 1.7  --   --  1.8  PHOS 3.2 3.3 3.7  --   --   --    Liver Function Tests: Recent Labs  Lab 09/28/23 0350 09/29/23 0247 09/30/23 0305 10/01/23 0239  AST 14* 13* 13* 12*  ALT 10 11 10 11   ALKPHOS 48 50  52 53  BILITOT 0.4 0.8 0.7 0.8  PROT 5.1* 5.1* 5.2* 5.3*  ALBUMIN 2.2* 2.2* 2.3* 2.3*   No results for input(s): LIPASE, AMYLASE in the last 168 hours. No results for input(s): AMMONIA in the last 168 hours. CBC: Recent Labs  Lab 09/28/23 0350 09/29/23 0247 09/30/23 0305 10/01/23 0239 10/02/23 0315  WBC 10.2 8.0 8.1 7.0 8.5  NEUTROABS 6.1 4.6 4.7 3.6  --   HGB 10.6* 10.9* 11.2* 11.3* 11.5*  HCT 33.0* 32.8* 34.2* 35.1* 34.6*  MCV 86.4 84.8 85.1 85.8 84.2  PLT 304 289 291 297 289   Cardiac Enzymes: No results for input(s): CKTOTAL, CKMB, CKMBINDEX, TROPONINI in the last 168 hours. BNP: Invalid input(s): POCBNP CBG: No results for input(s): GLUCAP in the last 168 hours. D-Dimer No results for input(s): DDIMER in the last 72 hours. Hgb A1c No results for input(s): HGBA1C in the last 72 hours. Lipid Profile No results for input(s): CHOL, HDL, LDLCALC, TRIG, CHOLHDL, LDLDIRECT in the last 72 hours. Thyroid  function studies No results for input(s): TSH, T4TOTAL, T3FREE, THYROIDAB in the last 72 hours.  Invalid input(s): FREET3 Anemia work up No results for input(s): VITAMINB12, FOLATE, FERRITIN, TIBC, IRON, RETICCTPCT in the last 72 hours. Urinalysis No results found for: COLORURINE, APPEARANCEUR, LABSPEC, PHURINE, GLUCOSEU, HGBUR, BILIRUBINUR, KETONESUR, PROTEINUR, UROBILINOGEN, NITRITE, LEUKOCYTESUR Sepsis Labs Recent Labs  Lab 09/29/23 0247 09/30/23 0305 10/01/23 0239 10/02/23 0315  WBC 8.0 8.1 7.0 8.5   Microbiology Recent Results (from the past 240 hours)  Resp panel by RT-PCR (RSV, Flu  A&B, Covid) Anterior Nasal Swab     Status: None   Collection Time: 09/26/23 11:39 AM   Specimen: Anterior Nasal Swab  Result Value Ref Range Status   SARS Coronavirus 2 by RT PCR NEGATIVE NEGATIVE Final    Comment: (NOTE) SARS-CoV-2 target nucleic acids are NOT DETECTED.  The SARS-CoV-2 RNA is  generally detectable in upper respiratory specimens during the acute phase of infection. The lowest concentration of SARS-CoV-2 viral copies this assay can detect is 138 copies/mL. A negative result does not preclude SARS-Cov-2 infection and should not be used as the sole basis for treatment or other patient management decisions. A negative result may occur with  improper specimen collection/handling, submission of specimen other than nasopharyngeal swab, presence of viral mutation(s) within the areas targeted by this assay, and inadequate number of viral copies(<138 copies/mL). A negative result must be combined with clinical observations, patient history, and epidemiological information. The expected result is Negative.  Fact Sheet for Patients:  bloggercourse.com  Fact Sheet for Healthcare Providers:  seriousbroker.it  This test is no t yet approved or cleared by the [place]  FDA and  has been authorized for detection and/or diagnosis of SARS-CoV-2 by FDA under an Emergency Use Authorization (EUA). This EUA will remain  in effect (meaning this test can be used) for the duration of the COVID-19 declaration under Section 564(b)(1) of the Act, 21 U.S.C.section 360bbb-3(b)(1), unless the authorization is terminated  or revoked sooner.       Influenza A by PCR NEGATIVE NEGATIVE Final   Influenza B by PCR NEGATIVE NEGATIVE Final    Comment: (NOTE) The Xpert Xpress SARS-CoV-2/FLU/RSV plus assay is intended as an aid in the diagnosis of influenza from Nasopharyngeal swab specimens and should not be used as a sole basis for treatment. Nasal washings and aspirates are unacceptable for Xpert Xpress SARS-CoV-2/FLU/RSV testing.  Fact Sheet for Patients: bloggercourse.com  Fact Sheet for Healthcare Providers: seriousbroker.it  This test is not yet approved or cleared by the [place]  [place] FDA and has been authorized for detection and/or diagnosis of SARS-CoV-2 by FDA under an Emergency Use Authorization (EUA). This EUA will remain in effect (meaning this test can be used) for the duration of the COVID-19 declaration under Section 564(b)(1) of the Act, 21 U.S.C. section 360bbb-3(b)(1), unless the authorization is terminated or revoked.     Resp Syncytial Virus by PCR NEGATIVE NEGATIVE Final    Comment: (NOTE) Fact Sheet for Patients: bloggercourse.com  Fact Sheet for Healthcare Providers: seriousbroker.it  This test is not yet approved or cleared by the [place]  FDA and has been authorized for detection and/or diagnosis of SARS-CoV-2 by FDA under an Emergency Use Authorization (EUA). This EUA will remain in effect (meaning this test can be used) for the duration of the COVID-19 declaration under Section 564(b)(1) of the Act, 21 U.S.C. section 360bbb-3(b)(1), unless the authorization is terminated or revoked.  Performed at [facility], [address], [place], [place] [address]   Body fluid culture w Gram Stain     Status: None   Collection Time: 09/26/23  1:22 PM   Specimen: Pleural Fluid  Result Value Ref Range Status   Specimen Description   Final    PLEURAL RIGHT Performed at [facility], [address] [place], [place] [address]    Special Requests   Final    NONE Performed at [facility], [address] [place], [place] [address]    Gram Stain  Final    WBC PRESENT,BOTH PMN AND MONONUCLEAR NO ORGANISMS SEEN    Culture   Final    NO GROWTH 3 DAYS Performed at [facility], [address], [place], [place] [address]    Report Status 09/30/2023 FINAL  Final  MRSA Next Gen by PCR, Nasal     Status: None   Collection Time: 09/26/23  7:00 PM  Result Value Ref Range Status   MRSA by PCR Next Gen NOT DETECTED NOT DETECTED Final     Comment: (NOTE) The GeneXpert MRSA Assay (FDA approved for NASAL specimens only), is one component of a comprehensive MRSA colonization surveillance program. It is not intended to diagnose MRSA infection nor to guide or monitor treatment for MRSA infections. Test performance is not FDA approved in patients less than 40 years old. Performed at [facility], [address] [place], [place] [address]   Culture, blood (Routine X 2) w Reflex to ID Panel     Status: None   Collection Time: 09/26/23  7:27 PM   Specimen: BLOOD RIGHT HAND  Result Value Ref Range Status   Specimen Description BLOOD RIGHT HAND  Final   Special Requests   Final    BOTTLES DRAWN AEROBIC AND ANAEROBIC Blood Culture results may not be optimal due to an inadequate volume of blood received in culture bottles   Culture   Final    NO GROWTH 5 DAYS Performed at [facility], [address], [place], [place] [address]    Report Status 10/01/2023 FINAL  Final  Culture, blood (Routine X 2) w Reflex to ID Panel     Status: Abnormal   Collection Time: 09/26/23  7:46 PM   Specimen: BLOOD LEFT ARM  Result Value Ref Range Status   Specimen Description BLOOD LEFT ARM  Final   Special Requests   Final    BOTTLES DRAWN AEROBIC AND ANAEROBIC Blood Culture results may not be optimal due to an inadequate volume of blood received in culture bottles   Culture  Setup Time   Final    GRAM POSITIVE RODS AEROBIC BOTTLE ONLY CRITICAL RESULT CALLED TO, READ BACK BY AND VERIFIED WITH: PHARMD JIMMY WYLAND ON 09/28/23 @ 2301 BY DRT    Culture (A)  Final    DIPHTHEROIDS(CORYNEBACTERIUM SPECIES) Standardized susceptibility testing for this organism is not available. Performed at [facility], [address] [place], [place] [address]    Report Status 10/03/2023 FINAL  Final  Respiratory (~20 pathogens) panel by PCR     Status: None   Collection Time: 09/27/23  8:59 AM   Specimen: Nasopharyngeal Swab; Respiratory  Result  Value Ref Range Status   Adenovirus NOT DETECTED NOT DETECTED Final   Coronavirus 229E NOT DETECTED NOT DETECTED Final    Comment: (NOTE) The Coronavirus on the Respiratory Panel, DOES NOT test for the novel  Coronavirus (2019 nCoV)    Coronavirus HKU1 NOT DETECTED NOT DETECTED Final   Coronavirus NL63 NOT DETECTED NOT DETECTED Final   Coronavirus OC43 NOT DETECTED NOT DETECTED Final   Metapneumovirus NOT DETECTED NOT DETECTED Final   Rhinovirus / Enterovirus NOT DETECTED NOT DETECTED Final   Influenza A NOT DETECTED NOT DETECTED Final   Influenza B NOT DETECTED NOT DETECTED Final   Parainfluenza Virus 1 NOT DETECTED NOT DETECTED Final   Parainfluenza Virus 2 NOT DETECTED NOT DETECTED Final   Parainfluenza Virus 3 NOT DETECTED NOT DETECTED Final   Parainfluenza Virus  4 NOT DETECTED NOT DETECTED Final   Respiratory Syncytial Virus NOT DETECTED NOT DETECTED Final   Bordetella pertussis NOT DETECTED NOT DETECTED Final   Bordetella Parapertussis NOT DETECTED NOT DETECTED Final   Chlamydophila pneumoniae NOT DETECTED NOT DETECTED Final   Mycoplasma pneumoniae NOT DETECTED NOT DETECTED Final    Comment: Performed at [facility], [address], [place], [place] [address]     Time coordinating discharge: Over 30 minutes  SIGNED:   Camellia PARAS Tashai Catino, DO  [facility] 10/04/2023, 9:51 AM

## 2023-10-04 NOTE — Care Management Important Message (Signed)
 Important Message  Patient Details  Name: Amanda Potter MRN: [id number] Date of Birth: 05/22/1946   Important Message Given:  Yes - Medicare IM   Patient left prior to IM delivery will mail a copy of the IM to the patient home address  Claretta Deed 10/04/2023, 12:09 PM

## 2023-10-04 NOTE — Progress Notes (Addendum)
 Physical Therapy Treatment Patient Details Name: Amanda Potter MRN: [id number] DOB: [birth year] Today's Date: 10/04/2023   History of Present Illness Pt is 78 yo presenting to [facility] ED 1/1 with sinus infection which proceeded to respiratory tract infection with large R pleural effusion s/p chest tube placement . Chest tube removed 1/8. PMH: GERD, hyperlipidemia, HTN.    PT Comments  Ambulates 250 feet with RW, shorter distance without assistive device but guarded and holding furniture in room for support. SpO2 96% on RA with activity, HR 135. Denies SOB or any pain. Feels close to baseline. Has adequate support available upon d/c. Adequate for d/c from mobility standpoint. Will follow until d/c.    If plan is discharge home, recommend the following: Assistance with cooking/housework;Assist for transportation   Can travel by private vehicle        Equipment Recommendations  None recommended by PT    Recommendations for Other Services       Precautions / Restrictions Precautions Precautions: Fall;Other (comment) Precaution Comments: monitor O2 Restrictions Weight Bearing Restrictions Per Provider Order: No     Mobility  Bed Mobility Overal bed mobility: Modified Independent             General bed mobility comments: increased time for sitting to supine with no use of rails and HOB slightly elevated.    Transfers Overall transfer level: Modified independent Equipment used: None               General transfer comment: stable upon rising, no assistive device, minimal sway.    Ambulation/Gait Ambulation/Gait assistance: Supervision Gait Distance (Feet): 250 Feet Assistive device: Rolling walker (2 wheels), None Gait Pattern/deviations: Step-through pattern, Decreased stride length Gait velocity: decreased Gait velocity interpretation: 1.31 - 2.62 ft/sec, indicative of limited community ambulator   General Gait Details: Stable with RW for support. No overt LOB  noted. Short distance without RW, demonstrating increased guarding, furniture walking. SpO2 96% on room air during bout. HR increased to 135.   Stairs Stairs:  (declines, has ramp)           Wheelchair Mobility     Tilt Bed    Modified Rankin (Stroke Patients Only)       Balance Overall balance assessment: No apparent balance deficits (not formally assessed)                                          Cognition Arousal: Alert Behavior During Therapy: WFL for tasks assessed/performed Overall Cognitive Status: Within Functional Limits for tasks assessed                                          Exercises      General Comments        Pertinent Vitals/Pain Pain Assessment Pain Assessment: No/denies pain    Home Living                          Prior Function            PT Goals (current goals can now be found in the care plan section) Acute Rehab PT Goals Patient Stated Goal: home PT Goal Formulation: With patient Time For Goal Achievement: 10/11/23 Potential to Achieve Goals: Good Progress towards PT goals: Progressing  toward goals    Frequency    Min 1X/week      PT Plan      Co-evaluation              AM-PAC PT 6 Clicks Mobility   Outcome Measure  Help needed turning from your back to your side while in a flat bed without using bedrails?: None Help needed moving from lying on your back to sitting on the side of a flat bed without using bedrails?: None Help needed moving to and from a bed to a chair (including a wheelchair)?: None Help needed standing up from a chair using your arms (e.g., wheelchair or bedside chair)?: None Help needed to walk in hospital room?: A Little Help needed climbing 3-5 steps with a railing? : A Little 6 Click Score: 22    End of Session Equipment Utilized During Treatment: Gait belt Activity Tolerance: Patient tolerated treatment well Patient left: in  chair;with call bell/phone within reach;with chair alarm set   PT Visit Diagnosis: Other abnormalities of gait and mobility (R26.89)     Time: 9092-9074 PT Time Calculation (min) (ACUTE ONLY): 18 min  Charges:    $Gait Training: 8-22 mins PT General Charges $$ ACUTE PT VISIT: 1 Visit                     Leontine Roads, PT, DPT [facility]  Rehabilitation Services Physical Therapist Office: [phone] Website: [email or web address]    Leontine GORMAN Roads 10/04/2023, 9:53 AM

## 2023-10-08 ENCOUNTER — Telehealth: Payer: Self-pay | Admitting: Pulmonary Disease

## 2023-10-08 NOTE — Telephone Encounter (Signed)
 Crystal states patient needs sooner consult appointment. States needs for malignant pleural effusion. Patient was seen in the hospital. No available appointments until 11/2023. Crystal states for our office to call the patient for appointment. Patient phone number is [phone].

## 2023-10-09 NOTE — Telephone Encounter (Signed)
 Lm x1 for patient to obtain current sx.  Spoke to [employer] with with [facility]. She is requesting sooner appt then March for malignant pleural effusion.  Dr. Celine Mans, please review chart and determine if patient should be seen sooner. Thanks

## 2023-10-09 NOTE — Telephone Encounter (Signed)
 Lm for patient to ask if 11/08/2023 work for her schedule.

## 2023-10-10 ENCOUNTER — Other Ambulatory Visit: Payer: Self-pay | Admitting: Physician Assistant

## 2023-10-10 DIAGNOSIS — J910 Malignant pleural effusion: Secondary | ICD-10-CM

## 2023-10-10 NOTE — Progress Notes (Signed)
[facility] Telephone:([phone]) [phone]   Fax:(336) [phone]  CONSULT NOTE  REFERRING PHYSICIAN: Dr. [place]   REASON FOR CONSULTATION:  Adenocarcinoma, unclear primary   HPI Amanda Potter is a 78 y.o. female with a past medical history significant for hypertension and GERD is referred to the clinic for malignant pleural effusion of unclear primary.   The patient presented to the ER on 09/26/23 with the chief compliant of cough and shortness of breath which had progressively getting worse of the last month which did not improve with steroids, albuterol, and antibiotics. She had CXR in the ER that showed right lung base collapse/consolidation with large right pleural effusion. The patient subsequently had a CT chest which showed right-sided chest tube is noted with moderate size right apical and basilar pneumothorax with minimal right pleural effusion. Airspace opacities are noted in the right lung concerning for pneumonia or possibly atelectasis.  Cytology ([id number] ) was positive for malignant cells showing adenocarcinoma w/ the differential consisting of upper GI, pancreaticobiliary, lung (enteric), GYN/ovary as primary site.   The patient had tumor markers with CA 19-9 (442 high), CA 125 (23.3 normal), AFP (2.5 normal), and CEA (3.2 normal).   Therefore the patient had CT chest, abdomen, and pelvis which was unrevealing for primary malignancy except there was mild enhancement of the pleura and mildly enlarged precarinal lymph node.  Was no acute localizing process in the abdomen or pelvis.   Being discharged, she is feeling fatigued.  She saw her PCP earlier this week.  She is supposed to follow-up with endocrinology and pulmonary medicine for the pleural effusion/pneumothorax.  She denies any cough. She still having some trouble with sinus pressure and left ear pain.  She denies any fever, chills, or night sweats.  She has lost approximately 10 pounds over the last 4  weeks.  Sometimes she feels like food may be getting stuck in her throat secondary to nasal drainage.  He had 1 episode of queasiness in the hospital but otherwise denies any nausea, vomiting, or constipation.  Has had some loose stools since being discharged.  Nuys any lymphadenopathy or breast mass.  Denies any urinary changes.  Denies any abdominal pain.  Denies any jaundice or itching.  Denies any headache or visual changes.  The patient is up to date on her routine health screenings. The patient does not have any personal history of malignancy.  She is up-to-date on her mammograms which she believes her most recent was last year.  I can see she had a breast biopsy performed in 2021 which was negative for malignancy. Her last colonoscopy was on 05/30/2019, was by Dr. Elnoria Howard for IDA. The small bowel endoscopy shoed normal stomach and duodenum and 5 cm hiatal hernia. The colonoscopy showed two polyps and demonstrated prior ileo-colonic anastomosis which was secondary to GI perforation during prior colonoscopy.   She denies any family history of malignancy. Her mother had diabetes and passed away due to old age. Her father passed away due to a MVA. Her brother had lung disease secondary to smoking.   She worked as a Tree surgeon, Investment banker, corporate, Camera operator. She has 4 children. She is divorced. She denies drug, alcohol, or history of smoking.       HPI  Past Medical History:  Diagnosis Date   Deviated nasal septum    Environmental allergies    GERD (gastroesophageal reflux disease)    Hyperlipidemia    Hypertension    Perforation of colon (HCC)  2006   Following colonoscopy   Sinusitis, acute     Past Surgical History:  Procedure Laterality Date   CHOLECYSTECTOMY     COLON SURGERY Right 2006   hemicolectomy   COLONOSCOPY WITH PROPOFOL N/A 05/30/2019   Procedure: COLONOSCOPY WITH PROPOFOL;  Surgeon: Jeani Hawking, MD;  Location: [facility];  Service: Endoscopy;  Laterality: N/A;    ENTEROSCOPY N/A 05/30/2019   Procedure: ENTEROSCOPY;  Surgeon: Jeani Hawking, MD;  Location: [facility];  Service: Endoscopy;  Laterality: N/A;   HERNIA REPAIR  [id number]   INCISIONAL HERNIA REPAIR N/A 03/03/2013   Procedure: LAPAROSCOPIC INCISIONAL HERNIA REPAIR;  Surgeon: Adolph Pollack, MD;  Location: [facility];  Service: General;  Laterality: N/A;  LYSIS OF ADHESIONS FOR 60 MINUTES   INSERTION OF MESH N/A 03/03/2013   Procedure: INSERTION OF MESH;  Surgeon: Adolph Pollack, MD;  Location: [facility];  Service: General;  Laterality: N/A;   POLYPECTOMY  05/30/2019   Procedure: POLYPECTOMY;  Surgeon: Jeani Hawking, MD;  Location: [facility];  Service: Endoscopy;;   TONSILLECTOMY     TUBAL LIGATION      Family History  Problem Relation Age of Onset   Hearing loss Mother    Diabetes Mother    Hyperlipidemia Mother    Hypertension Mother    Heart disease Father     Social History Social History   Tobacco Use   Smoking status: Never   Smokeless tobacco: Never  Vaping Use   Vaping status: Never Used  Substance Use Topics   Alcohol use: No   Drug use: No    Allergies  Allergen Reactions   Tape Rash    Glue on tape    Current Outpatient Medications  Medication Sig Dispense Refill   albuterol (VENTOLIN HFA) 108 (90 Base) MCG/ACT inhaler Inhale 1 puff into the lungs every 6 (six) hours as needed for shortness of breath.     aspirin 81 MG tablet Take 81 mg by mouth daily at 12 noon.      Cholecalciferol 1.25 MG (50000 UT) capsule Take 50,000 Units by mouth daily.     diphenhydrAMINE (BENADRYL) 25 MG tablet Take 25 mg by mouth daily as needed for allergies.     Ferrous Sulfate (SLOW FE PO) Take 1 tablet by mouth daily.     fluticasone (FLONASE) 50 MCG/ACT nasal spray Place 1 spray into both nostrils daily as needed for allergies or rhinitis.     methimazole (TAPAZOLE) 10 MG tablet Take 2 tablets (20 mg total) by mouth daily. 180 tablet 0   naproxen sodium (ANAPROX) 220 MG tablet Take  220 mg by mouth 2 (two) times daily as needed (for pain).     omeprazole (PRILOSEC) 20 MG capsule Take 20 mg by mouth daily.     Polyvinyl Alcohol-Povidone (REFRESH OP) Place 1 drop into both eyes daily as needed (dry eyes).     simvastatin (ZOCOR) 20 MG tablet Take 20 mg by mouth at bedtime.      No current facility-administered medications for this visit.    REVIEW OF SYSTEMS:   Review of Systems  Constitutional: Positive for fatigue. Positive for weight loss. Negative for chills and fever. HENT: Positive for sinus pressure and nasal congestion and left ear pain. Negative for mouth sores, nosebleeds, sore throat.  Eyes: Negative for eye problems and icterus.  Respiratory: Positive for dyspnea. Negative for cough, hemoptysis, and wheezing.   Cardiovascular: Negative for chest pain and leg swelling.  Gastrointestinal: Negative for  abdominal pain, constipation, diarrhea, nausea and vomiting.  Genitourinary: Negative for bladder incontinence, difficulty urinating, dysuria, frequency and hematuria.   Musculoskeletal: Negative for back pain, gait problem, neck pain and neck stiffness.  Skin: Negative for itching and rash.  Neurological: Negative for dizziness, extremity weakness, gait problem, headaches, light-headedness and seizures.  Hematological: Negative for adenopathy. Does not bruise/bleed easily.  Psychiatric/Behavioral: Negative for confusion, depression and sleep disturbance. The patient is not nervous/anxious.     PHYSICAL EXAMINATION:  Blood pressure 116/73, temperature 98 F (36.7 C), temperature source Temporal, resp. rate 17, weight 156 lb 9.6 oz (71 kg), SpO2 92%.  ECOG PERFORMANCE STATUS: 1  Physical Exam  Constitutional: Oriented to person, place, and time and well-developed, well-nourished, and in no distress.  HENT:  Head: Normocephalic and atraumatic.  Mouth/Throat: Oropharynx is clear and moist. No oropharyngeal exudate.  Eyes: Conjunctivae are normal. Right eye  exhibits no discharge. Left eye exhibits no discharge. No scleral icterus.  Neck: Normal range of motion. Neck supple.  Cardiovascular: Normal rate, regular rhythm, normal heart sounds and intact distal pulses.   Pulmonary/Chest: Effort normal. Decreased breath sounds right lung. No respiratory distress. No wheezes. No rales.  Abdominal: Soft. Bowel sounds are normal. Exhibits no distension and no mass. There is no tenderness.  Musculoskeletal: Normal range of motion. Exhibits no edema.  Lymphadenopathy:    No cervical adenopathy.  Neurological: Alert and oriented to person, place, and time. Exhibits muscle wasting. She was examined in the wheelchair.  Skin: Skin is warm and dry. No rash noted. Not diaphoretic. No erythema. No pallor.  Psychiatric: Mood, memory and judgment normal.  Vitals reviewed.  LABORATORY DATA: Lab Results  Component Value Date   WBC 7.4 10/11/2023   HGB 11.4 (L) 10/11/2023   HCT 34.1 (L) 10/11/2023   MCV 83.0 10/11/2023   PLT 381 10/11/2023      Chemistry      Component Value Date/Time   NA 136 10/11/2023 1340   K 3.1 (L) 10/11/2023 1340   CL 99 10/11/2023 1340   CO2 29 10/11/2023 1340   BUN 8 10/11/2023 1340   CREATININE 0.72 10/11/2023 1340      Component Value Date/Time   CALCIUM 8.6 (L) 10/11/2023 1340   ALKPHOS 83 10/11/2023 1340   AST 10 (L) 10/11/2023 1340   ALT 7 10/11/2023 1340   BILITOT 0.5 10/11/2023 1340       RADIOGRAPHIC STUDIES: DG CHEST PORT 1 VIEW Result Date: 10/04/2023 CLINICAL DATA:  Chest tube removal. EXAM: PORTABLE CHEST 1 VIEW COMPARISON:  10/03/2023 and older studies. FINDINGS: Small right apical pneumothorax is stable from the previous day's exam. Right lower lateral hemithorax pneumothorax is less apparent, presume smaller. Opacity at the right lung base is stable. No new lung abnormalities. Stable hiatal hernia. IMPRESSION: 1. Small residual pneumothorax at the right lateral lung base is less apparent and presumed  smaller. No change in the small right apical pneumothorax. 2. No other change from the previous day's exam. Electronically Signed   By: Amie Portland M.D.   On: 10/04/2023 07:32   CT CHEST ABDOMEN PELVIS W CONTRAST Result Date: 10/03/2023 CLINICAL DATA:  Metastatic disease evaluation. History of perforated colon. EXAM: CT CHEST, ABDOMEN, AND PELVIS WITH CONTRAST TECHNIQUE: Multidetector CT imaging of the chest, abdomen and pelvis was performed following the standard protocol during bolus administration of intravenous contrast. RADIATION DOSE REDUCTION: This exam was performed according to the departmental dose-optimization program which includes automated exposure control, adjustment of the  mA and/or kV according to patient size and/or use of iterative reconstruction technique. CONTRAST:  75mL OMNIPAQUE IOHEXOL 350 MG/ML SOLN COMPARISON:  Chest CT 09/27/2023. Report only CT abdomen and pelvis 02/04/2013. FINDINGS: CT CHEST FINDINGS Cardiovascular: No significant vascular findings. Heart is mildly enlarged. No pericardial effusion. There are atherosclerotic calcifications of the aorta. Mediastinum/Nodes: There subcentimeter hypodense thyroid nodules bilaterally. There is an enlarged precarinal lymph node measuring 11 mm. No other enlarged mediastinal, hilar or axillary lymph nodes are seen. The visualized esophagus is within normal limits. There is a moderate-sized hiatal hernia containing the proximal stomach. Lungs/Pleura: Right chest tube has been removed. There is a small right-sided hydropneumothorax persisting with multiple air-fluid levels in the pleural space. Pleural fluid has increased from prior. There is mild enhancement of the pleura. There is right lower lobe atelectasis/airspace disease. Bands of opacity in the right upper lobe and right lower lobe are favored is atelectasis. Patchy ground-glass opacities in the right middle lobe have resolved in the interval. The left lung is grossly clear. Trachea  and central airways are patent. Musculoskeletal: Twos 1 right chest wall emphysema is present. CT ABDOMEN PELVIS FINDINGS Hepatobiliary: No focal liver abnormality is seen. Status post cholecystectomy. No biliary dilatation. Pancreas: Unremarkable. No pancreatic ductal dilatation or surrounding inflammatory changes. Spleen: Normal in size without focal abnormality. Adrenals/Urinary Tract: Adrenal glands are unremarkable. Kidneys are normal, without renal calculi, focal lesion, or hydronephrosis. Bladder is unremarkable. Stomach/Bowel: There is a large hiatal hernia containing the proximal stomach. No evidence of bowel wall thickening, distention, or inflammatory changes. There is sigmoid and descending colon diverticulosis. The appendix is surgically absent. Vascular/Lymphatic: Aortic atherosclerosis. No enlarged abdominal or pelvic lymph nodes. Reproductive: Partially calcified uterine fibroids are present measuring up to 10 mm. Adnexa are within normal limits. Other: Patient is status post anterior abdominal wall hernia repair with mesh. Mild hernia persists containing nondilated small bowel loops. There is no ascites. Musculoskeletal: No acute or significant osseous findings. IMPRESSION: 1. Interval removal of right chest tube. Small right-sided hydropneumothorax persists with multiple air-fluid levels in the pleural space. Pleural fluid has increased from prior. There is mild enhancement of the pleura. Empyema not excluded in the appropriate clinical setting. 2. Right lower lobe atelectasis/airspace disease. 3. Mildly enlarged precarinal lymph node. 4. No acute localizing process in the abdomen or pelvis. 5. Large hiatal hernia containing the proximal stomach. 6. Colonic diverticulosis. 7. Uterine fibroids. 8. Aortic atherosclerosis. Aortic Atherosclerosis (ICD10-I70.0). Electronically Signed   By: Darliss Cheney M.D.   On: 10/03/2023 17:50   DG CHEST PORT 1 VIEW Result Date: 10/03/2023 CLINICAL DATA:  Chest  tube removal EXAM: PORTABLE CHEST 1 VIEW COMPARISON:  10/03/2023, 10/01/2023 FINDINGS: Interim removal of right-sided chest tube. Small residual right apical and CP angle pneumothorax. Mild atelectasis at right base. Hiatal hernia. Small volume gas in the right chest wall soft tissues. Stable cardiomediastinal silhouette with aortic atherosclerosis. IMPRESSION: Interim removal of right-sided chest tube with stable small residual right apical and CP angle pneumothorax. Subsegmental atelectasis at the right base Electronically Signed   By: Jasmine Pang M.D.   On: 10/03/2023 15:46   DG Chest Port 1 View Result Date: 10/03/2023 CLINICAL DATA:  [address] Respiratory failure (HCC) [phone] EXAM: PORTABLE CHEST 1 VIEW COMPARISON:  Chest x-ray 10/01/2023, CT abdomen pelvis 02/04/2013 FINDINGS: Right chest tube pigtail catheter overlies the right lateral mid hemithorax. Associated subcutaneus soft tissue emphysema. The heart and mediastinal contours are unchanged. Atherosclerotic plaque. Round density overlying the lower mediastinal  on the left consistent with known hiatal hernia. Right base atelectasis. No definite focal consolidation. No pulmonary edema. No pleural effusion. Residual at least trace volume stable right pneumothorax. No left pneumothorax. No acute osseous abnormality. IMPRESSION: 1. Residual at least trace volume stable right pneumothorax. 2. Right chest tube pigtail catheter overlies the right lateral mid hemithorax. 3. Hiatal hernia. Electronically Signed   By: Tish Frederickson M.D.   On: 10/03/2023 09:48   DG Chest Port 1 View Result Date: 10/02/2023 CLINICAL DATA:  Shortness of breath, follow-up pneumothorax EXAM: PORTABLE CHEST 1 VIEW COMPARISON:  09/27/2023 FINDINGS: Cardiac shadow is enlarged but stable. Aortic calcifications are again seen. Basilar airspace opacity is noted on the right. No sizable effusion or pneumothorax is seen. Pigtail catheter is seen on the right although it has been slightly  withdrawn when compared with the previous day. IMPRESSION: No pneumothorax is noted. Persistent right basilar airspace opacity is noted. It should be noted this study was dictated on 10/02/2023 despite being obtained on 09/28/2023. This was due to a mechanical failure in the portable machine. Electronically Signed   By: Alcide Clever M.D.   On: 10/02/2023 20:42   DG CHEST PORT 1 VIEW Result Date: 10/01/2023 CLINICAL DATA:  Chest tube EXAM: PORTABLE CHEST - 1 VIEW COMPARISON:  the previous day's study FINDINGS: Stable right lateral pigtail chest tube, with small adjacent residual lateral pneumothorax. minimal regional subcutaneous emphysema. Some patchy airspace opacities laterally at the right lung base, and in the infrahilar regions left greater than right. Heart size and mediastinal contours are within normal limits. Aortic Atherosclerosis (ICD10-170.0). No effusion. Bilateral shoulder DJD.  Cholecystectomy clips. IMPRESSION: Stable right chest tube with tiny lateral residual pneumothorax. Electronically Signed   By: Corlis Leak M.D.   On: 10/01/2023 10:24   DG CHEST PORT 1 VIEW Result Date: 09/30/2023 CLINICAL DATA:  Follow-up pneumothorax. Pleural effusion. Pneumonia. EXAM: PORTABLE CHEST 1 VIEW COMPARISON:  09/29/2023 FINDINGS: There is a right basilar pigtail thoracostomy tube in similar position to the previous exam. Tiny right apical pneumothorax is visualized on the current exam measuring approximately 6 mm over the apex. Improved right mid and right lower lung opacities. No new findings. IMPRESSION: 1. Stable position of right-sided chest tube. Tiny right apical pneumothorax measuring approximately 6 mm over the apex. 2. Improved right mid and right lower lung opacities. Electronically Signed   By: Signa Kell M.D.   On: 09/30/2023 09:47   DG CHEST PORT 1 VIEW Result Date: 09/29/2023 CLINICAL DATA:  Follow-up chest tube. EXAM: PORTABLE CHEST 1 VIEW COMPARISON:  09/27/2023 FINDINGS: Unchanged  position of right lateral basilar pigtail thoracostomy tube. No pneumothorax identified. Heart size and mediastinal contours are stable. Aortic atherosclerosis. Persistent opacities within the right midlung and right lower lung which appear unchanged in the interval. Left lung appears clear. IMPRESSION: 1. Stable position of right lateral basilar pigtail thoracostomy tube. No pneumothorax identified. 2. Persistent opacities within the right midlung and right lower lung. Electronically Signed   By: Signa Kell M.D.   On: 09/29/2023 10:50   ECHOCARDIOGRAM COMPLETE Result Date: 09/27/2023    ECHOCARDIOGRAM REPORT   Patient Name:   Amanda Potter Date of Exam: 09/27/2023 Medical Rec #:  [id number]      Height:       66.0 in Accession #:    [id number]     Weight:       179.9 lb Date of Birth:  26-Jul-1946      BSA:  1.912 m Patient Age:    77 years       BP:           106/71 mmHg Patient Gender: F              HR:           106 bpm. Exam Location:  Inpatient Procedure: 2D Echo, Cardiac Doppler, Color Doppler and Intracardiac            Opacification Agent Indications:    CHF- Acute Diastolic  History:        Patient has prior history of Echocardiogram examinations.                 Arrythmias:Tachycardia; Risk Factors:Dyslipidemia and                 Hypertension.  Sonographer:    Karma Ganja Referring Phys: Heide Scales [facility]  Sonographer Comments: Technically difficult study due to poor echo windows. Image acquisition challenging due to patient body habitus. IMPRESSIONS  1. Left ventricular ejection fraction, by estimation, is 65 to 70%. The left ventricle has hyperdynamic function. The left ventricle has no regional wall motion abnormalities. Left ventricular diastolic parameters are consistent with Grade I diastolic dysfunction (impaired relaxation).  2. Right ventricular systolic function is normal. The right ventricular size is normal. There is mildly elevated pulmonary artery systolic pressure. The  estimated right ventricular systolic pressure is 39.4 mmHg.  3. Left atrial size was mildly dilated.  4. Right atrial size was mildly dilated.  5. The mitral valve is normal in structure. No evidence of mitral valve regurgitation. No evidence of mitral stenosis.  6. The aortic valve is tricuspid. There is mild calcification of the aortic valve. Aortic valve regurgitation is not visualized. No aortic stenosis is present.  7. The inferior vena cava is normal in size with <50% respiratory variability, suggesting right atrial pressure of 8 mmHg. FINDINGS  Left Ventricle: Left ventricular ejection fraction, by estimation, is 65 to 70%. The left ventricle has hyperdynamic function. The left ventricle has no regional wall motion abnormalities. Definity contrast agent was given IV to delineate the left ventricular endocardial borders. The left ventricular internal cavity size was normal in size. There is no left ventricular hypertrophy. Left ventricular diastolic parameters are consistent with Grade I diastolic dysfunction (impaired relaxation). Right Ventricle: The right ventricular size is normal. No increase in right ventricular wall thickness. Right ventricular systolic function is normal. There is mildly elevated pulmonary artery systolic pressure. The tricuspid regurgitant velocity is 2.80  m/s, and with an assumed right atrial pressure of 8 mmHg, the estimated right ventricular systolic pressure is 39.4 mmHg. Left Atrium: Left atrial size was mildly dilated. Right Atrium: Right atrial size was mildly dilated. Pericardium: There is no evidence of pericardial effusion. Mitral Valve: The mitral valve is normal in structure. There is mild calcification of the mitral valve leaflet(s). Mild mitral annular calcification. No evidence of mitral valve regurgitation. No evidence of mitral valve stenosis. Tricuspid Valve: The tricuspid valve is normal in structure. Tricuspid valve regurgitation is trivial. Aortic Valve: The  aortic valve is tricuspid. There is mild calcification of the aortic valve. Aortic valve regurgitation is not visualized. No aortic stenosis is present. Aortic valve mean gradient measures 6.0 mmHg. Aortic valve peak gradient measures 13.1 mmHg. Aortic valve area, by VTI measures 3.43 cm. Pulmonic Valve: The pulmonic valve was normal in structure. Pulmonic valve regurgitation is not visualized. Aorta: The aortic root is normal  in size and structure. Venous: The inferior vena cava is normal in size with less than 50% respiratory variability, suggesting right atrial pressure of 8 mmHg. IAS/Shunts: No atrial level shunt detected by color flow Doppler.  LEFT VENTRICLE PLAX 2D LVIDd:         4.00 cm   Diastology LVIDs:         2.20 cm   LV e' medial:    8.70 cm/s LV PW:         1.00 cm   LV E/e' medial:  8.8 LV IVS:        1.00 cm   LV e' lateral:   8.55 cm/s LVOT diam:     2.00 cm   LV E/e' lateral: 8.9 LV SV:         92 LV SV Index:   48 LVOT Area:     3.14 cm  RIGHT VENTRICLE             IVC RV Basal diam:  3.70 cm     IVC diam: 2.00 cm RV S prime:     20.00 cm/s TAPSE (M-mode): 2.1 cm LEFT ATRIUM             Index        RIGHT ATRIUM           Index LA diam:        3.70 cm 1.94 cm/m   RA Area:     25.50 cm LA Vol (A2C):   35.8 ml 18.73 ml/m  RA Volume:   82.50 ml  43.15 ml/m LA Vol (A4C):   79.2 ml 41.43 ml/m LA Biplane Vol: 54.0 ml 28.25 ml/m  AORTIC VALVE AV Area (Vmax):    3.14 cm AV Area (Vmean):   3.06 cm AV Area (VTI):     3.43 cm AV Vmax:           181.00 cm/s AV Vmean:          118.000 cm/s AV VTI:            0.269 m AV Peak Grad:      13.1 mmHg AV Mean Grad:      6.0 mmHg LVOT Vmax:         181.00 cm/s LVOT Vmean:        115.000 cm/s LVOT VTI:          0.294 m LVOT/AV VTI ratio: 1.09  AORTA Ao Root diam: 3.10 cm MITRAL VALVE                TRICUSPID VALVE MV Area (PHT): 5.20 cm     TR Peak grad:   31.4 mmHg MV Decel Time: 146 msec     TR Vmax:        280.00 cm/s MV E velocity: 76.40 cm/s MV A  velocity: 138.00 cm/s  SHUNTS MV E/A ratio:  0.55         Systemic VTI:  0.29 m                             Systemic Diam: 2.00 cm Dalton McleanMD Electronically signed by Wilfred Lacy Signature Date/Time: 09/27/2023/3:25:35 PM    Final    CT CHEST WO CONTRAST Result Date: 09/27/2023 CLINICAL DATA:  Pneumonia. EXAM: CT CHEST WITHOUT CONTRAST TECHNIQUE: Multidetector CT imaging of the chest was performed following the standard protocol without IV contrast. RADIATION DOSE REDUCTION: This exam was  performed according to the departmental dose-optimization program which includes automated exposure control, adjustment of the mA and/or kV according to patient size and/or use of iterative reconstruction technique. COMPARISON:  Radiograph of same day. FINDINGS: Cardiovascular: Atherosclerosis of thoracic aorta is noted without aneurysm formation. Normal cardiac size. No pericardial effusion. Coronary artery calcifications are noted. Mediastinum/Nodes: Thyroid gland is unremarkable. No adenopathy. Moderate size hiatal hernia. Lungs/Pleura: Right-sided chest tube is noted. Moderate size right apical and basilar pneumothorax is noted. Minimal right pleural effusion is noted as well. Airspace opacities are noted in right lung concerning for pneumonia or possibly atelectasis. Bronchiectasis is noted in left lower lobe. Upper Abdomen: No acute abnormality. Musculoskeletal: No chest wall mass or suspicious bone lesions identified. IMPRESSION: Right-sided chest tube is noted with moderate size right apical and basilar pneumothorax with minimal right pleural effusion. Airspace opacities are noted in the right lung concerning for pneumonia or possibly atelectasis. Bronchiectasis is noted in left lower lobe. Moderate size hiatal hernia. Coronary artery calcifications are noted. Aortic Atherosclerosis (ICD10-I70.0). Electronically Signed   By: Lupita Raider M.D.   On: 09/27/2023 13:05   DG CHEST PORT 1 VIEW Result Date:  09/27/2023 CLINICAL DATA:  Right-sided chest tube. EXAM: PORTABLE CHEST 1 VIEW COMPARISON:  September 26, 2023. FINDINGS: Stable cardiomediastinal silhouette. Right-sided chest tube is noted with minimal right apical pneumothorax which is improved compared to prior exam, a continued mild right basilar pneumothorax with associated effusion. IMPRESSION: Stable right-sided chest tube with improved left apical pneumothorax, but continued right basilar hydropneumothorax. Electronically Signed   By: Lupita Raider M.D.   On: 09/27/2023 09:47   DG Chest Port 1 View Result Date: 09/26/2023 CLINICAL DATA:  [id number] Chest tube in place [id number] EXAM: PORTABLE CHEST 1 VIEW COMPARISON:  Chest radiograph from earlier today. FINDINGS: Stable cardiomediastinal silhouette with normal heart size and moderate hiatal hernia. Right basilar pigtail pleural catheter in place. Persistent moderate right hydropneumothorax with substantially decreased right pleural effusion component and with new moderate ex vacuo right pneumothorax component. No mediastinal shift. No left pneumothorax. No left pleural effusion. Improved aeration of the right lung with persistent mild to moderate patchy bibasilar lung opacities. No overt pulmonary edema. IMPRESSION: 1. Persistent moderate right hydropneumothorax status post right basilar pigtail pleural catheter placement, with substantially decreased right pleural effusion component and with new moderate ex vacuo right pneumothorax component. No mediastinal shift. 2. Improved aeration of the right lung with persistent mild to moderate patchy bibasilar lung opacities. 3. Chronic moderate hiatal hernia. Electronically Signed   By: Delbert Phenix M.D.   On: 09/26/2023 14:38   DG Chest Port 1 View Result Date: 09/26/2023 CLINICAL DATA:  Shortness of breath. EXAM: PORTABLE CHEST 1 VIEW COMPARISON:  12/10/2017 FINDINGS: Right base collapse/consolidation with large right pleural effusion. Left lung is hyperexpanded  but appears clear. Moderate to large hiatal hernia. The cardio pericardial silhouette is enlarged. Bones are diffusely demineralized. Telemetry leads overlie the chest. IMPRESSION: 1. Right base collapse/consolidation with large right pleural effusion. Given the unilateral disease, neoplasm is a concern. Chest CT (with contrast if renal function permits) recommended to further evaluate. 2. Moderate to large hiatal hernia. Electronically Signed   By: Kennith Center M.D.   On: 09/26/2023 11:50    ASSESSMENT: This is a very pleasant 78 year old female with stage IV adenocarcinoma of unclear primary. She presented with a malignant right pleural effusion. She was diagnosed in January 2025.   The patient was seen with Dr. Arbutus Ped.  Dr. Arbutus Ped discussed the need for further workup. Dr. Arbutus Ped recommends a PET scan to identify the primary.  Dr. Arbutus Ped also recommends a brain MRI to rule out metastatic disease to the brain.  Additionally, we will send molecular studies with [employer] and [facility] 1 molecular studies is still pending to assess if the patient has any actionable mutations.   We will see the patient back in 2 weeks for evaluation and to review the results and for a more detailed discussion about her current condition and treatment options.   Will follow-up on the referral to pulmonary medicine to help manage the pleural effusion.  The patient's potassium is a little low today.  I have sent a prescription for potassium to the pharmacy for 1 week.  We will recheck this at her next follow-up. The patient was advised to call immediately if she has any concerning symptoms in the interval. The patient voices understanding of current disease status and treatment options and is in agreement with the current care plan. All questions were answered. The patient knows to call the clinic with any problems, questions or concerns. We can certainly see the patient much sooner if necessary   The patient  voices understanding of current disease status and treatment options and is in agreement with the current care plan.  All questions were answered. The patient knows to call the clinic with any problems, questions or concerns. We can certainly see the patient much sooner if necessary.  Thank you so much for allowing me to participate in the care of Darrol Angel. I will continue to follow up the patient with you and assist in her care.   Disclaimer: This note was dictated with voice recognition software. Similar sounding words can inadvertently be transcribed and may not be corrected upon review.   Brigitt Mcclish L March Joos October 11, 2023, 2:44 PM  ADDENDUM: Hematology/Oncology Attending: I had a face-to-face encounter with the patient today.  I reviewed her records, lab, scan and recommended her care plan.  This is a very pleasant 78 years old white female who is a never smoker and presented to the emergency department on September 26, 2023 with worsening dyspnea as well as cough that has been progressive over the previous few weeks.  The patient was treated with a course of antibiotics, steroid and inhaler no improvement.  During her evaluation she had a chest x-ray on September 26, 2023 and that showed right base collapse/consolidation with large right pleural effusion suspicious for neoplasm.  The patient had a chest tube placed by pulmonary medicine with drainage of more than 2 L of fluid from the right pleural space.  The cytology was consistent with adenocarcinoma but the primary was not completely identified with Zymaxid immunohistochemical stains suspicious for primary lung versus upper gastrointestinal versus pancreatobiliary.  The patient had repeat CT scan of the chest without contrast on September 27, 2023 and that showed right-sided chest tube in place with moderate size right apical and basilar pneumothorax and minimal right pleural effusion.  CT scan of the chest, abdomen and pelvis on  October 03, 2023 showed interval removal of the chest tube with a small right-sided hydropneumothorax persisted with multiple air-fluid levels in the right pleural space but the pleural fluid had increased from the previous imaging studies with mild enhancement of the pleura.  There was a right lower lobe atelectasis/airspace disease.  The scan also showed mildly enlarged precarinal lymph node and uterine fibroids. The patient was referred to me today  for evaluation and recommendation regarding her condition. When seen today she is feeling fine with no concerning complaints except for the fatigue and shortness of breath. I had a lengthy discussion with the patient and her daughter today about her current condition and further investigation to confirm the primary source of her malignancy as well as potential treatment options. I recommended for the patient to complete the staging workup by ordering a PET scan as well as MRI of the brain to rule out any other metastatic disease. We will send her tissue block as well as blood sample for molecular studies to identify any actionable mutation in this patient with none smoking history. We will see the patient back for follow-up visit in around 2 weeks for evaluation and discussion of her treatment options based on the molecular studies as well as the final staging workup. The patient was advised to call immediately if she has any other concerning symptoms in the interval. The total time spent in the appointment was 60 minutes. Disclaimer: This note was dictated with voice recognition software. Similar sounding words can inadvertently be transcribed and may be missed upon review. Lajuana Matte, MD

## 2023-10-11 ENCOUNTER — Inpatient Hospital Stay: Payer: Medicare Other | Attending: Physician Assistant | Admitting: Physician Assistant

## 2023-10-11 ENCOUNTER — Other Ambulatory Visit: Payer: Self-pay | Admitting: Physician Assistant

## 2023-10-11 ENCOUNTER — Inpatient Hospital Stay: Payer: Medicare Other

## 2023-10-11 ENCOUNTER — Other Ambulatory Visit: Payer: Self-pay

## 2023-10-11 VITALS — BP 116/73 | Temp 98.0°F | Resp 17 | Wt 156.6 lb

## 2023-10-11 DIAGNOSIS — R599 Enlarged lymph nodes, unspecified: Secondary | ICD-10-CM | POA: Insufficient documentation

## 2023-10-11 DIAGNOSIS — C569 Malignant neoplasm of unspecified ovary: Secondary | ICD-10-CM | POA: Insufficient documentation

## 2023-10-11 DIAGNOSIS — J910 Malignant pleural effusion: Secondary | ICD-10-CM | POA: Diagnosis not present

## 2023-10-11 DIAGNOSIS — C801 Malignant (primary) neoplasm, unspecified: Secondary | ICD-10-CM | POA: Diagnosis not present

## 2023-10-11 DIAGNOSIS — C3431 Malignant neoplasm of lower lobe, right bronchus or lung: Secondary | ICD-10-CM | POA: Diagnosis present

## 2023-10-11 DIAGNOSIS — E876 Hypokalemia: Secondary | ICD-10-CM | POA: Diagnosis not present

## 2023-10-11 LAB — CBC WITH DIFFERENTIAL (CANCER CENTER ONLY)
Abs Immature Granulocytes: 0.03 10*3/uL (ref 0.00–0.07)
Basophils Absolute: 0.1 10*3/uL (ref 0.0–0.1)
Basophils Relative: 1 %
Eosinophils Absolute: 0.1 10*3/uL (ref 0.0–0.5)
Eosinophils Relative: 2 %
HCT: 34.1 % — ABNORMAL LOW (ref 36.0–46.0)
Hemoglobin: 11.4 g/dL — ABNORMAL LOW (ref 12.0–15.0)
Immature Granulocytes: 0 %
Lymphocytes Relative: 19 %
Lymphs Abs: 1.4 10*3/uL (ref 0.7–4.0)
MCH: 27.7 pg (ref 26.0–34.0)
MCHC: 33.4 g/dL (ref 30.0–36.0)
MCV: 83 fL (ref 80.0–100.0)
Monocytes Absolute: 0.7 10*3/uL (ref 0.1–1.0)
Monocytes Relative: 10 %
Neutro Abs: 5.1 10*3/uL (ref 1.7–7.7)
Neutrophils Relative %: 68 %
Platelet Count: 381 10*3/uL (ref 150–400)
RBC: 4.11 MIL/uL (ref 3.87–5.11)
RDW: 14 % (ref 11.5–15.5)
WBC Count: 7.4 10*3/uL (ref 4.0–10.5)
nRBC: 0 % (ref 0.0–0.2)

## 2023-10-11 LAB — CMP (CANCER CENTER ONLY)
ALT: 7 U/L (ref 0–44)
AST: 10 U/L — ABNORMAL LOW (ref 15–41)
Albumin: 3.1 g/dL — ABNORMAL LOW (ref 3.5–5.0)
Alkaline Phosphatase: 83 U/L (ref 38–126)
Anion gap: 8 (ref 5–15)
BUN: 8 mg/dL (ref 8–23)
CO2: 29 mmol/L (ref 22–32)
Calcium: 8.6 mg/dL — ABNORMAL LOW (ref 8.9–10.3)
Chloride: 99 mmol/L (ref 98–111)
Creatinine: 0.72 mg/dL (ref 0.44–1.00)
GFR, Estimated: >60 mL/min (ref >60)
Glucose, Bld: 110 mg/dL — ABNORMAL HIGH (ref 70–99)
Potassium: 3.1 mmol/L — ABNORMAL LOW (ref 3.5–5.1)
Sodium: 136 mmol/L (ref 135–145)
Total Bilirubin: 0.5 mg/dL (ref 0.0–1.2)
Total Protein: 6.2 g/dL — ABNORMAL LOW (ref 6.5–8.1)

## 2023-10-11 MED ORDER — POTASSIUM CHLORIDE CRYS ER 20 MEQ PO TBCR: 20.0000 meq | EXTENDED_RELEASE_TABLET | Freq: Every day | ORAL | 0 refills | Status: DC

## 2023-10-11 NOTE — Telephone Encounter (Signed)
Lm x2 for patient. Will call once more due to nature of call.   

## 2023-10-17 ENCOUNTER — Telehealth: Payer: Self-pay | Admitting: Internal Medicine

## 2023-10-17 ENCOUNTER — Ambulatory Visit (HOSPITAL_COMMUNITY)
Admission: RE | Admit: 2023-10-17 | Discharge: 2023-10-17 | Disposition: A | Discharge status: Home / Self Care | Payer: Medicare Other | Source: Ambulatory Visit | Attending: Physician Assistant | Admitting: Physician Assistant

## 2023-10-17 DIAGNOSIS — C801 Malignant (primary) neoplasm, unspecified: Secondary | ICD-10-CM | POA: Insufficient documentation

## 2023-10-17 DIAGNOSIS — I6782 Cerebral ischemia: Secondary | ICD-10-CM | POA: Diagnosis not present

## 2023-10-17 DIAGNOSIS — J910 Malignant pleural effusion: Secondary | ICD-10-CM | POA: Insufficient documentation

## 2023-10-17 MED ORDER — GADOBUTROL 1 MMOL/ML IV SOLN
7.0000 mL | Freq: Once | INTRAVENOUS | Status: AC | PRN
  Administered 2023-10-17: 7 mL via INTRAVENOUS

## 2023-10-17 NOTE — Telephone Encounter (Signed)
Received message from oncology. They are wanting patient to schedule soonest consult. Per note from 1/13, Dr Celine Mans it offering to see the patient on 2/13 at 11:30 in her hold spot. Left message for [name] (daughter) to schedule consultation.

## 2023-10-18 ENCOUNTER — Encounter (HOSPITAL_COMMUNITY): Payer: Self-pay

## 2023-10-18 NOTE — Telephone Encounter (Signed)
Asking Fredric Mare to sched appt per notes below. Adv to please send a letter w/ppwk to complete as well. NFN

## 2023-10-18 NOTE — Telephone Encounter (Signed)
Scheduled 2/13 at 11:30. Reminder mailed to patient with consult form. Nothing further needed

## 2023-10-19 ENCOUNTER — Encounter (HOSPITAL_COMMUNITY)
Admission: RE | Admit: 2023-10-19 | Discharge: 2023-10-19 | Disposition: A | Discharge status: Home / Self Care | Payer: Medicare Other | Source: Ambulatory Visit | Attending: Physician Assistant

## 2023-10-19 DIAGNOSIS — J9811 Atelectasis: Secondary | ICD-10-CM | POA: Insufficient documentation

## 2023-10-19 DIAGNOSIS — C801 Malignant (primary) neoplasm, unspecified: Secondary | ICD-10-CM | POA: Diagnosis present

## 2023-10-19 DIAGNOSIS — J910 Malignant pleural effusion: Secondary | ICD-10-CM | POA: Diagnosis present

## 2023-10-19 DIAGNOSIS — R590 Localized enlarged lymph nodes: Secondary | ICD-10-CM | POA: Diagnosis not present

## 2023-10-19 LAB — GLUCOSE, CAPILLARY: Glucose-Capillary: 85 mg/dL (ref 70–99)

## 2023-10-19 MED ORDER — FLUDEOXYGLUCOSE F - 18 (FDG) INJECTION
7.8000 | Freq: Once | INTRAVENOUS | Status: AC | PRN
  Administered 2023-10-19: 7.65 via INTRAVENOUS

## 2023-10-22 ENCOUNTER — Encounter: Payer: Self-pay | Admitting: Internal Medicine

## 2023-10-22 NOTE — Progress Notes (Unsigned)
[facility] OFFICE PROGRESS NOTE  Richmond Campbell., PA-C [address] [place] [address]  DIAGNOSIS: Stage IV non-small cell lung cancer, adenocarcinoma. The patient presented with pleural thickening of the right lower lobe as well as malignant effusion.  She was diagnosed in January 2025  Molecular Studies: Biomarker Findings HRD signature - Cannot Be Determined Microsatellite status - Cannot Be Determined ? Tumor Mutational Burden - Cannot Be Determined Genomic Findings For a complete list of the genes assayed, please refer to the Appendix. KRAS G12D DNMT3A F460fs*7 SMAD4 R361H TET2 Q1559fs*31, U0454* 7 Disease relevant genes with no reportable alterations: ALK, BRAF, EGFR, ERBB2, MET, RET, ROS1  PRIOR THERAPY: None   CURRENT THERAPY: Palliative systemic chemotherapy with carboplatin for an AUC of 5, Alimta 500 mg/m, and Keytruda 20 mg IV every 3 weeks.  First dose expected next week on 10/30/2023.  INTERVAL HISTORY: Amanda Potter 78 y.o. female returns to the clinic today for a follow-up visit.  In summary, the patient establish care in the clinic on 10/11/2023. She presented to the emergency room with a chief complaint of cough and shortness of breath in early January and was found to have a malignant pleural effusion with cytology showing adenocarcinoma, although differential could consist of upper GI, pancreaticobiliary, lung, or GYN/ovary as primary site.  Therefore, we recommended molecular studies as well as completing the staging workup with a brain MRI and PET scan.  She is here today to review the results and discuss the next steps.  Overall, she is feeling similar today.  Regarding the malignant effusions, she is scheduled to see pulmonary medicine on 11/08/2023. Overall, she states her breathing is "good". She states she is not having any problems with her breathing. She denies any significant cough. She would be interested in seeing a nutritionist.  She does not have part of her colon after a perforation in the past.   She denies any fever or chills. She has had night sweats twice with sweating around her head.  Denies any hemoptysis or chest pain.  Denies any nausea, vomiting, diarrhea, or constipation.  Denies any headache or visual changes.  She is here today for evaluation and for a more detailed discussion about her current condition and treatment options.   MEDICAL HISTORY: Past Medical History:  Diagnosis Date   Deviated nasal septum    Environmental allergies    GERD (gastroesophageal reflux disease)    Hyperlipidemia    Hypertension    Perforation of colon (HCC) 2006   Following colonoscopy   Sinusitis, acute     ALLERGIES:  is allergic to tape.  MEDICATIONS:  Current Outpatient Medications  Medication Sig Dispense Refill   albuterol (VENTOLIN HFA) 108 (90 Base) MCG/ACT inhaler Inhale 1 puff into the lungs every 6 (six) hours as needed for shortness of breath.     aspirin 81 MG tablet Take 81 mg by mouth daily at 12 noon.      Cholecalciferol 1.25 MG (50000 UT) capsule Take 50,000 Units by mouth daily.     diphenhydrAMINE (BENADRYL) 25 MG tablet Take 25 mg by mouth daily as needed for allergies.     Ferrous Sulfate (SLOW FE PO) Take 1 tablet by mouth daily.     folic acid (FOLVITE) 1 MG tablet Take 1 tablet (1 mg total) by mouth daily. 30 tablet 2   lidocaine-prilocaine (EMLA) cream Apply 1 Application topically as needed. 30 g 2   methimazole (TAPAZOLE) 10 MG tablet Take 2  tablets (20 mg total) by mouth daily. 180 tablet 0   naproxen sodium (ANAPROX) 220 MG tablet Take 220 mg by mouth 2 (two) times daily as needed (for pain).     omeprazole (PRILOSEC) 20 MG capsule Take 20 mg by mouth daily.     Polyvinyl Alcohol-Povidone (REFRESH OP) Place 1 drop into both eyes daily as needed (dry eyes).     prochlorperazine (COMPAZINE) 10 MG tablet Take 1 tablet (10 mg total) by mouth every 6 (six) hours as needed. 30 tablet 2    simvastatin (ZOCOR) 20 MG tablet Take 20 mg by mouth at bedtime.      potassium chloride SA (KLOR-CON M) 20 MEQ tablet Take 1 tablet (20 mEq total) by mouth daily. 4 tablet 0   No current facility-administered medications for this visit.   Facility-Administered Medications Ordered in Other Visits  Medication Dose Route Frequency Provider Last Rate Last Admin   cyanocobalamin (VITAMIN B12) injection 1,000 mcg  1,000 mcg Intramuscular Once Shawnta Schlegel L, PA-C        SURGICAL HISTORY:  Past Surgical History:  Procedure Laterality Date   CHOLECYSTECTOMY     COLON SURGERY Right 2006   hemicolectomy   COLONOSCOPY WITH PROPOFOL N/A 05/30/2019   Procedure: COLONOSCOPY WITH PROPOFOL;  Surgeon: Jeani Hawking, MD;  Location: [facility];  Service: Endoscopy;  Laterality: N/A;   ENTEROSCOPY N/A 05/30/2019   Procedure: ENTEROSCOPY;  Surgeon: Jeani Hawking, MD;  Location: [facility];  Service: Endoscopy;  Laterality: N/A;   HERNIA REPAIR  [id number]   INCISIONAL HERNIA REPAIR N/A 03/03/2013   Procedure: LAPAROSCOPIC INCISIONAL HERNIA REPAIR;  Surgeon: Adolph Pollack, MD;  Location: [facility];  Service: General;  Laterality: N/A;  LYSIS OF ADHESIONS FOR 60 MINUTES   INSERTION OF MESH N/A 03/03/2013   Procedure: INSERTION OF MESH;  Surgeon: Adolph Pollack, MD;  Location: [facility];  Service: General;  Laterality: N/A;   POLYPECTOMY  05/30/2019   Procedure: POLYPECTOMY;  Surgeon: Jeani Hawking, MD;  Location: [facility];  Service: Endoscopy;;   TONSILLECTOMY     TUBAL LIGATION      REVIEW OF SYSTEMS:   Review of Systems  Constitutional: Positive for fatigue. Positive for weight loss. Negative for chills and fever.  HENT:   Negative for mouth sores, nosebleeds, sore throat and trouble swallowing.   Eyes: Negative for eye problems and icterus.  Respiratory: Negative for cough, hemoptysis, shortness of breath and wheezing.   Cardiovascular: Negative for chest pain and leg swelling.   Gastrointestinal: Negative for abdominal pain, constipation, diarrhea, nausea and vomiting.  Genitourinary: Negative for bladder incontinence, difficulty urinating, dysuria, frequency and hematuria.   Musculoskeletal: Negative for back pain, gait problem, neck pain and neck stiffness.  Skin: Negative for itching and rash.  Neurological: Negative for dizziness, extremity weakness, gait problem, headaches, light-headedness and seizures.  Hematological: Negative for adenopathy. Does not bruise/bleed easily.  Psychiatric/Behavioral: Negative for confusion, depression and sleep disturbance. The patient is not nervous/anxious.     PHYSICAL EXAMINATION:  Blood pressure 129/81, pulse (!) 114, temperature (!) 97.3 F (36.3 C), temperature source Temporal, resp. rate 18, weight 161 lb 1.6 oz (73.1 kg), SpO2 97%.  ECOG PERFORMANCE STATUS: 1-2  Physical Exam  Constitutional: Oriented to person, place, and time and well-developed, well-nourished, and in no distress.  HENT:  Head: Normocephalic and atraumatic.  Mouth/Throat: Oropharynx is clear and moist. No oropharyngeal exudate.  Eyes: Conjunctivae are normal. Right eye exhibits no discharge. Left eye exhibits no  discharge. No scleral icterus.  Neck: Normal range of motion. Neck supple.  Cardiovascular: Normal rate, regular rhythm, normal heart sounds and intact distal pulses.   Pulmonary/Chest: Effort normal. Quiet breath sounds in right lung.  No respiratory distress. No wheezes. No rales.  Abdominal: Soft. Bowel sounds are normal. Exhibits no distension and no mass. There is no tenderness.  Musculoskeletal: Normal range of motion. Exhibits no edema.  Lymphadenopathy:    No cervical adenopathy.  Neurological: Alert and oriented to person, place, and time. Exhibits muscle wasting. Examined in the wheelchair.  Skin: Skin is warm and dry. No rash noted. Not diaphoretic. No erythema. No pallor.  Psychiatric: Mood, memory and judgment normal.   Vitals reviewed.  LABORATORY DATA: Lab Results  Component Value Date   WBC 7.6 10/23/2023   HGB 10.9 (L) 10/23/2023   HCT 33.0 (L) 10/23/2023   MCV 82.1 10/23/2023   PLT 491 (H) 10/23/2023      Chemistry      Component Value Date/Time   NA 137 10/23/2023 0838   K 3.3 (L) 10/23/2023 0838   CL 100 10/23/2023 0838   CO2 31 10/23/2023 0838   BUN 9 10/23/2023 0838   CREATININE 0.55 10/23/2023 0838      Component Value Date/Time   CALCIUM 8.2 (L) 10/23/2023 0838   ALKPHOS 163 (H) 10/23/2023 0838   AST 25 10/23/2023 0838   ALT 23 10/23/2023 0838   BILITOT 0.7 10/23/2023 0838       RADIOGRAPHIC STUDIES:  NM PET Image Initial (PI) Skull Base To Thigh Result Date: 10/22/2023 CLINICAL DATA:  Initial treatment strategy for malignant effusion. Metastatic adenocarcinoma of unclear primary. EXAM: NUCLEAR MEDICINE PET SKULL BASE TO THIGH TECHNIQUE: 7.6 mCi F-18 FDG was injected intravenously. Full-ring PET imaging was performed from the skull base to thigh after the radiotracer. CT data was obtained and used for attenuation correction and anatomic localization. Fasting blood glucose: 85 mg/dl COMPARISON:  None Available. FINDINGS: Mediastinal blood pool activity: SUV max 1.8 Liver activity: SUV max NA NECK: No hypermetabolic lymph nodes in the neck. Incidental CT findings: None. CHEST: Rim of hypermetabolic pleural thickening within the RIGHT lower lobe there is associated with a loculated few effusion with pockets of gas as well as atelectasis of the RIGHT lower lobe. Atelectatic RIGHT lower lobe is intensely hypermetabolic with SUV max equal 10.1. Peripheral hypermetabolic pleural thickening with SUV max equal 5.2. Slightly more superior in the medial RIGHT lower lobe 17 mm in mild mm nodule image 62/4 with SUV max equal 16. Elsewhere within the RIGHT hemithorax there is scattered hypermetabolic pleural thickening. For example in the medial RIGHT upper lobe hypermetabolic pleural thickening  with SUV max equal 4.5 on image 52. The mediastinum small paratracheal lymph nodes are moderately hypermetabolic SUV max equal 4.2 in the RIGHT lower paratracheal nodal station on image 64. No hypermetabolic supraclavicular nodes. Incidental CT findings: None. ABDOMEN/PELVIS: No abnormal hypermetabolic activity within the liver, pancreas, adrenal glands, or spleen. No hypermetabolic lymph nodes in the abdomen or pelvis. Incidental CT findings: None. SKELETON: No focal hypermetabolic activity to suggest skeletal metastasis. Incidental CT findings: None. IMPRESSION: 1. And rim of hypermetabolic pleural thickening in the RIGHT lower lobe with intensely hypermetabolic atelectasis and nodularity in RIGHT lower lobe. Concern for primary pleural or lung malignancy including mesothelioma. 2. Loculated effusion in the RIGHT hemithorax with pockets of gas gas. Potential superimposed empyema. 3. Small mildly hypermetabolic mediastinal lymph nodes are indeterminate. 4. No evidence distant metastatic disease. Electronically  Signed   By: Genevive Bi M.D.   On: 10/22/2023 11:27   MR BRAIN W WO CONTRAST Result Date: 10/18/2023 CLINICAL DATA:  Provided history: Malignant pleural effusion. Adenocarcinoma. Metastatic disease evaluation. EXAM: MRI HEAD WITHOUT AND WITH CONTRAST TECHNIQUE: Multiplanar, multiecho pulse sequences of the brain and surrounding structures were obtained without and with intravenous contrast. CONTRAST:  7mL GADAVIST GADOBUTROL 1 MMOL/ML IV SOLN COMPARISON:  None. FINDINGS: Brain: Mild generalized cerebral atrophy. Multifocal T2 FLAIR hyperintense signal abnormality within the cerebral white matter, nonspecific but compatible with moderate chronic small vessel ischemic disease. Chronic microhemorrhage within the right corona radiata/basal ganglia. There is no acute infarct. No evidence of an intracranial mass. No extra-axial fluid collection. No midline shift. No pathologic intracranial enhancement  identified. Vascular: Maintained flow voids within the proximal large arterial vessels. Skull and upper cervical spine: No focal worrisome marrow lesion. Incompletely assessed cervical spondylosis. Sinuses/Orbits: No mass or acute finding within the imaged orbits. Prior bilateral ocular lens replacement. Minimal mucosal thickening within a right ethmoid air cell posteriorly. IMPRESSION: 1. No evidence of intracranial metastatic disease. 2. Moderate chronic small vessel ischemic changes within the cerebral white matter. 3. Mild generalized cerebral atrophy. Electronically Signed   By: Jackey Loge D.O.   On: 10/18/2023 07:57   DG CHEST PORT 1 VIEW Result Date: 10/04/2023 CLINICAL DATA:  Chest tube removal. EXAM: PORTABLE CHEST 1 VIEW COMPARISON:  10/03/2023 and older studies. FINDINGS: Small right apical pneumothorax is stable from the previous day's exam. Right lower lateral hemithorax pneumothorax is less apparent, presume smaller. Opacity at the right lung base is stable. No new lung abnormalities. Stable hiatal hernia. IMPRESSION: 1. Small residual pneumothorax at the right lateral lung base is less apparent and presumed smaller. No change in the small right apical pneumothorax. 2. No other change from the previous day's exam. Electronically Signed   By: Amie Portland M.D.   On: 10/04/2023 07:32   CT CHEST ABDOMEN PELVIS W CONTRAST Result Date: 10/03/2023 CLINICAL DATA:  Metastatic disease evaluation. History of perforated colon. EXAM: CT CHEST, ABDOMEN, AND PELVIS WITH CONTRAST TECHNIQUE: Multidetector CT imaging of the chest, abdomen and pelvis was performed following the standard protocol during bolus administration of intravenous contrast. RADIATION DOSE REDUCTION: This exam was performed according to the departmental dose-optimization program which includes automated exposure control, adjustment of the mA and/or kV according to patient size and/or use of iterative reconstruction technique. CONTRAST:   75mL OMNIPAQUE IOHEXOL 350 MG/ML SOLN COMPARISON:  Chest CT 09/27/2023. Report only CT abdomen and pelvis 02/04/2013. FINDINGS: CT CHEST FINDINGS Cardiovascular: No significant vascular findings. Heart is mildly enlarged. No pericardial effusion. There are atherosclerotic calcifications of the aorta. Mediastinum/Nodes: There subcentimeter hypodense thyroid nodules bilaterally. There is an enlarged precarinal lymph node measuring 11 mm. No other enlarged mediastinal, hilar or axillary lymph nodes are seen. The visualized esophagus is within normal limits. There is a moderate-sized hiatal hernia containing the proximal stomach. Lungs/Pleura: Right chest tube has been removed. There is a small right-sided hydropneumothorax persisting with multiple air-fluid levels in the pleural space. Pleural fluid has increased from prior. There is mild enhancement of the pleura. There is right lower lobe atelectasis/airspace disease. Bands of opacity in the right upper lobe and right lower lobe are favored is atelectasis. Patchy ground-glass opacities in the right middle lobe have resolved in the interval. The left lung is grossly clear. Trachea and central airways are patent. Musculoskeletal: Twos 1 right chest wall emphysema is present. CT ABDOMEN PELVIS  FINDINGS Hepatobiliary: No focal liver abnormality is seen. Status post cholecystectomy. No biliary dilatation. Pancreas: Unremarkable. No pancreatic ductal dilatation or surrounding inflammatory changes. Spleen: Normal in size without focal abnormality. Adrenals/Urinary Tract: Adrenal glands are unremarkable. Kidneys are normal, without renal calculi, focal lesion, or hydronephrosis. Bladder is unremarkable. Stomach/Bowel: There is a large hiatal hernia containing the proximal stomach. No evidence of bowel wall thickening, distention, or inflammatory changes. There is sigmoid and descending colon diverticulosis. The appendix is surgically absent. Vascular/Lymphatic: Aortic  atherosclerosis. No enlarged abdominal or pelvic lymph nodes. Reproductive: Partially calcified uterine fibroids are present measuring up to 10 mm. Adnexa are within normal limits. Other: Patient is status post anterior abdominal wall hernia repair with mesh. Mild hernia persists containing nondilated small bowel loops. There is no ascites. Musculoskeletal: No acute or significant osseous findings. IMPRESSION: 1. Interval removal of right chest tube. Small right-sided hydropneumothorax persists with multiple air-fluid levels in the pleural space. Pleural fluid has increased from prior. There is mild enhancement of the pleura. Empyema not excluded in the appropriate clinical setting. 2. Right lower lobe atelectasis/airspace disease. 3. Mildly enlarged precarinal lymph node. 4. No acute localizing process in the abdomen or pelvis. 5. Large hiatal hernia containing the proximal stomach. 6. Colonic diverticulosis. 7. Uterine fibroids. 8. Aortic atherosclerosis. Aortic Atherosclerosis (ICD10-I70.0). Electronically Signed   By: Darliss Cheney M.D.   On: 10/03/2023 17:50   DG CHEST PORT 1 VIEW Result Date: 10/03/2023 CLINICAL DATA:  Chest tube removal EXAM: PORTABLE CHEST 1 VIEW COMPARISON:  10/03/2023, 10/01/2023 FINDINGS: Interim removal of right-sided chest tube. Small residual right apical and CP angle pneumothorax. Mild atelectasis at right base. Hiatal hernia. Small volume gas in the right chest wall soft tissues. Stable cardiomediastinal silhouette with aortic atherosclerosis. IMPRESSION: Interim removal of right-sided chest tube with stable small residual right apical and CP angle pneumothorax. Subsegmental atelectasis at the right base Electronically Signed   By: Jasmine Pang M.D.   On: 10/03/2023 15:46   DG Chest Port 1 View Result Date: 10/03/2023 CLINICAL DATA:  [id number] Respiratory failure (HCC) [phone] EXAM: PORTABLE CHEST 1 VIEW COMPARISON:  Chest x-ray 10/01/2023, CT abdomen pelvis 02/04/2013 FINDINGS: Right  chest tube pigtail catheter overlies the right lateral mid hemithorax. Associated subcutaneus soft tissue emphysema. The heart and mediastinal contours are unchanged. Atherosclerotic plaque. Round density overlying the lower mediastinal on the left consistent with known hiatal hernia. Right base atelectasis. No definite focal consolidation. No pulmonary edema. No pleural effusion. Residual at least trace volume stable right pneumothorax. No left pneumothorax. No acute osseous abnormality. IMPRESSION: 1. Residual at least trace volume stable right pneumothorax. 2. Right chest tube pigtail catheter overlies the right lateral mid hemithorax. 3. Hiatal hernia. Electronically Signed   By: Tish Frederickson M.D.   On: 10/03/2023 09:48   DG Chest Port 1 View Result Date: 10/02/2023 CLINICAL DATA:  Shortness of breath, follow-up pneumothorax EXAM: PORTABLE CHEST 1 VIEW COMPARISON:  09/27/2023 FINDINGS: Cardiac shadow is enlarged but stable. Aortic calcifications are again seen. Basilar airspace opacity is noted on the right. No sizable effusion or pneumothorax is seen. Pigtail catheter is seen on the right although it has been slightly withdrawn when compared with the previous day. IMPRESSION: No pneumothorax is noted. Persistent right basilar airspace opacity is noted. It should be noted this study was dictated on 10/02/2023 despite being obtained on 09/28/2023. This was due to a mechanical failure in the portable machine. Electronically Signed   By: Eulah Pont.D.  On: 10/02/2023 20:42   DG CHEST PORT 1 VIEW Result Date: 10/01/2023 CLINICAL DATA:  Chest tube EXAM: PORTABLE CHEST - 1 VIEW COMPARISON:  the previous day's study FINDINGS: Stable right lateral pigtail chest tube, with small adjacent residual lateral pneumothorax. minimal regional subcutaneous emphysema. Some patchy airspace opacities laterally at the right lung base, and in the infrahilar regions left greater than right. Heart size and mediastinal  contours are within normal limits. Aortic Atherosclerosis (ICD10-170.0). No effusion. Bilateral shoulder DJD.  Cholecystectomy clips. IMPRESSION: Stable right chest tube with tiny lateral residual pneumothorax. Electronically Signed   By: Corlis Leak M.D.   On: 10/01/2023 10:24   DG CHEST PORT 1 VIEW Result Date: 09/30/2023 CLINICAL DATA:  Follow-up pneumothorax. Pleural effusion. Pneumonia. EXAM: PORTABLE CHEST 1 VIEW COMPARISON:  09/29/2023 FINDINGS: There is a right basilar pigtail thoracostomy tube in similar position to the previous exam. Tiny right apical pneumothorax is visualized on the current exam measuring approximately 6 mm over the apex. Improved right mid and right lower lung opacities. No new findings. IMPRESSION: 1. Stable position of right-sided chest tube. Tiny right apical pneumothorax measuring approximately 6 mm over the apex. 2. Improved right mid and right lower lung opacities. Electronically Signed   By: Signa Kell M.D.   On: 09/30/2023 09:47   DG CHEST PORT 1 VIEW Result Date: 09/29/2023 CLINICAL DATA:  Follow-up chest tube. EXAM: PORTABLE CHEST 1 VIEW COMPARISON:  09/27/2023 FINDINGS: Unchanged position of right lateral basilar pigtail thoracostomy tube. No pneumothorax identified. Heart size and mediastinal contours are stable. Aortic atherosclerosis. Persistent opacities within the right midlung and right lower lung which appear unchanged in the interval. Left lung appears clear. IMPRESSION: 1. Stable position of right lateral basilar pigtail thoracostomy tube. No pneumothorax identified. 2. Persistent opacities within the right midlung and right lower lung. Electronically Signed   By: Signa Kell M.D.   On: 09/29/2023 10:50   ECHOCARDIOGRAM COMPLETE Result Date: 09/27/2023    ECHOCARDIOGRAM REPORT   Patient Name:   Amanda Potter Date of Exam: 09/27/2023 Medical Rec #:  [id number]      Height:       66.0 in Accession #:    [id number]     Weight:       179.9 lb Date of Birth:   [birth year]      BSA:          1.912 m Patient Age:    45 years       BP:           106/71 mmHg Patient Gender: F              HR:           106 bpm. Exam Location:  Inpatient Procedure: 2D Echo, Cardiac Doppler, Color Doppler and Intracardiac            Opacification Agent Indications:    CHF- Acute Diastolic  History:        Patient has prior history of Echocardiogram examinations.                 Arrythmias:Tachycardia; Risk Factors:Dyslipidemia and                 Hypertension.  Sonographer:    Karma Ganja Referring Phys: Heide Scales [facility]  Sonographer Comments: Technically difficult study due to poor echo windows. Image acquisition challenging due to patient body habitus. IMPRESSIONS  1. Left ventricular ejection fraction, by estimation, is 65 to 70%. The  left ventricle has hyperdynamic function. The left ventricle has no regional wall motion abnormalities. Left ventricular diastolic parameters are consistent with Grade I diastolic dysfunction (impaired relaxation).  2. Right ventricular systolic function is normal. The right ventricular size is normal. There is mildly elevated pulmonary artery systolic pressure. The estimated right ventricular systolic pressure is 39.4 mmHg.  3. Left atrial size was mildly dilated.  4. Right atrial size was mildly dilated.  5. The mitral valve is normal in structure. No evidence of mitral valve regurgitation. No evidence of mitral stenosis.  6. The aortic valve is tricuspid. There is mild calcification of the aortic valve. Aortic valve regurgitation is not visualized. No aortic stenosis is present.  7. The inferior vena cava is normal in size with <50% respiratory variability, suggesting right atrial pressure of 8 mmHg. FINDINGS  Left Ventricle: Left ventricular ejection fraction, by estimation, is 65 to 70%. The left ventricle has hyperdynamic function. The left ventricle has no regional wall motion abnormalities. Definity contrast agent was given IV to delineate the left  ventricular endocardial borders. The left ventricular internal cavity size was normal in size. There is no left ventricular hypertrophy. Left ventricular diastolic parameters are consistent with Grade I diastolic dysfunction (impaired relaxation). Right Ventricle: The right ventricular size is normal. No increase in right ventricular wall thickness. Right ventricular systolic function is normal. There is mildly elevated pulmonary artery systolic pressure. The tricuspid regurgitant velocity is 2.80  m/s, and with an assumed right atrial pressure of 8 mmHg, the estimated right ventricular systolic pressure is 39.4 mmHg. Left Atrium: Left atrial size was mildly dilated. Right Atrium: Right atrial size was mildly dilated. Pericardium: There is no evidence of pericardial effusion. Mitral Valve: The mitral valve is normal in structure. There is mild calcification of the mitral valve leaflet(s). Mild mitral annular calcification. No evidence of mitral valve regurgitation. No evidence of mitral valve stenosis. Tricuspid Valve: The tricuspid valve is normal in structure. Tricuspid valve regurgitation is trivial. Aortic Valve: The aortic valve is tricuspid. There is mild calcification of the aortic valve. Aortic valve regurgitation is not visualized. No aortic stenosis is present. Aortic valve mean gradient measures 6.0 mmHg. Aortic valve peak gradient measures 13.1 mmHg. Aortic valve area, by VTI measures 3.43 cm. Pulmonic Valve: The pulmonic valve was normal in structure. Pulmonic valve regurgitation is not visualized. Aorta: The aortic root is normal in size and structure. Venous: The inferior vena cava is normal in size with less than 50% respiratory variability, suggesting right atrial pressure of 8 mmHg. IAS/Shunts: No atrial level shunt detected by color flow Doppler.  LEFT VENTRICLE PLAX 2D LVIDd:         4.00 cm   Diastology LVIDs:         2.20 cm   LV e' medial:    8.70 cm/s LV PW:         1.00 cm   LV E/e' medial:   8.8 LV IVS:        1.00 cm   LV e' lateral:   8.55 cm/s LVOT diam:     2.00 cm   LV E/e' lateral: 8.9 LV SV:         92 LV SV Index:   48 LVOT Area:     3.14 cm  RIGHT VENTRICLE             IVC RV Basal diam:  3.70 cm     IVC diam: 2.00 cm RV S prime:  20.00 cm/s TAPSE (M-mode): 2.1 cm LEFT ATRIUM             Index        RIGHT ATRIUM           Index LA diam:        3.70 cm 1.94 cm/m   RA Area:     25.50 cm LA Vol (A2C):   35.8 ml 18.73 ml/m  RA Volume:   82.50 ml  43.15 ml/m LA Vol (A4C):   79.2 ml 41.43 ml/m LA Biplane Vol: 54.0 ml 28.25 ml/m  AORTIC VALVE AV Area (Vmax):    3.14 cm AV Area (Vmean):   3.06 cm AV Area (VTI):     3.43 cm AV Vmax:           181.00 cm/s AV Vmean:          118.000 cm/s AV VTI:            0.269 m AV Peak Grad:      13.1 mmHg AV Mean Grad:      6.0 mmHg LVOT Vmax:         181.00 cm/s LVOT Vmean:        115.000 cm/s LVOT VTI:          0.294 m LVOT/AV VTI ratio: 1.09  AORTA Ao Root diam: 3.10 cm MITRAL VALVE                TRICUSPID VALVE MV Area (PHT): 5.20 cm     TR Peak grad:   31.4 mmHg MV Decel Time: 146 msec     TR Vmax:        280.00 cm/s MV E velocity: 76.40 cm/s MV A velocity: 138.00 cm/s  SHUNTS MV E/A ratio:  0.55         Systemic VTI:  0.29 m                             Systemic Diam: 2.00 cm Dalton McleanMD Electronically signed by Wilfred Lacy Signature Date/Time: 09/27/2023/3:25:35 PM    Final    CT CHEST WO CONTRAST Result Date: 09/27/2023 CLINICAL DATA:  Pneumonia. EXAM: CT CHEST WITHOUT CONTRAST TECHNIQUE: Multidetector CT imaging of the chest was performed following the standard protocol without IV contrast. RADIATION DOSE REDUCTION: This exam was performed according to the departmental dose-optimization program which includes automated exposure control, adjustment of the mA and/or kV according to patient size and/or use of iterative reconstruction technique. COMPARISON:  Radiograph of same day. FINDINGS: Cardiovascular: Atherosclerosis of thoracic  aorta is noted without aneurysm formation. Normal cardiac size. No pericardial effusion. Coronary artery calcifications are noted. Mediastinum/Nodes: Thyroid gland is unremarkable. No adenopathy. Moderate size hiatal hernia. Lungs/Pleura: Right-sided chest tube is noted. Moderate size right apical and basilar pneumothorax is noted. Minimal right pleural effusion is noted as well. Airspace opacities are noted in right lung concerning for pneumonia or possibly atelectasis. Bronchiectasis is noted in left lower lobe. Upper Abdomen: No acute abnormality. Musculoskeletal: No chest wall mass or suspicious bone lesions identified. IMPRESSION: Right-sided chest tube is noted with moderate size right apical and basilar pneumothorax with minimal right pleural effusion. Airspace opacities are noted in the right lung concerning for pneumonia or possibly atelectasis. Bronchiectasis is noted in left lower lobe. Moderate size hiatal hernia. Coronary artery calcifications are noted. Aortic Atherosclerosis (ICD10-I70.0). Electronically Signed   By: Lupita Raider M.D.   On: 09/27/2023  13:05   DG CHEST PORT 1 VIEW Result Date: 09/27/2023 CLINICAL DATA:  Right-sided chest tube. EXAM: PORTABLE CHEST 1 VIEW COMPARISON:  September 26, 2023. FINDINGS: Stable cardiomediastinal silhouette. Right-sided chest tube is noted with minimal right apical pneumothorax which is improved compared to prior exam, a continued mild right basilar pneumothorax with associated effusion. IMPRESSION: Stable right-sided chest tube with improved left apical pneumothorax, but continued right basilar hydropneumothorax. Electronically Signed   By: Lupita Raider M.D.   On: 09/27/2023 09:47   DG Chest Port 1 View Result Date: 09/26/2023 CLINICAL DATA:  [id number] Chest tube in place [id number] EXAM: PORTABLE CHEST 1 VIEW COMPARISON:  Chest radiograph from earlier today. FINDINGS: Stable cardiomediastinal silhouette with normal heart size and moderate hiatal hernia.  Right basilar pigtail pleural catheter in place. Persistent moderate right hydropneumothorax with substantially decreased right pleural effusion component and with new moderate ex vacuo right pneumothorax component. No mediastinal shift. No left pneumothorax. No left pleural effusion. Improved aeration of the right lung with persistent mild to moderate patchy bibasilar lung opacities. No overt pulmonary edema. IMPRESSION: 1. Persistent moderate right hydropneumothorax status post right basilar pigtail pleural catheter placement, with substantially decreased right pleural effusion component and with new moderate ex vacuo right pneumothorax component. No mediastinal shift. 2. Improved aeration of the right lung with persistent mild to moderate patchy bibasilar lung opacities. 3. Chronic moderate hiatal hernia. Electronically Signed   By: Delbert Phenix M.D.   On: 09/26/2023 14:38   DG Chest Port 1 View Result Date: 09/26/2023 CLINICAL DATA:  Shortness of breath. EXAM: PORTABLE CHEST 1 VIEW COMPARISON:  12/10/2017 FINDINGS: Right base collapse/consolidation with large right pleural effusion. Left lung is hyperexpanded but appears clear. Moderate to large hiatal hernia. The cardio pericardial silhouette is enlarged. Bones are diffusely demineralized. Telemetry leads overlie the chest. IMPRESSION: 1. Right base collapse/consolidation with large right pleural effusion. Given the unilateral disease, neoplasm is a concern. Chest CT (with contrast if renal function permits) recommended to further evaluate. 2. Moderate to large hiatal hernia. Electronically Signed   By: Kennith Center M.D.   On: 09/26/2023 11:50     ASSESSMENT/PLAN:  This is a very pleasant 78 year old female with stage IV adenocarcinoma of unclear primary. She presented with a malignant right pleural effusion. She was diagnosed in January 2025.  Molecular studies show no actionable mutations.   The patient was seen with Dr. Arbutus Ped today.  Dr. Arbutus Ped  reviewed her PET scan and brain MRI.  The scan shows metabolic pleural thickening of the right lower lobe with intensely hypermetabolic atelectasis and nodularity in the right lower lobe concerning for primary pleural or lung malignancy.  She also has mildly hypermetabolic mediastinal lymph nodes which are indeterminate.  There is no evidence of distant metastatic disease.  Dr. Arbutus Ped had a lengthly discussion with the patient today about her current condition and treatment options. The patient was given the option of a referral to hospice/palliative vs. Treatment with systemic chemotherapy with carboplatin for an AUC of 5, Alimta 500 mg/m, and Keytruda 200 mg IV every 3 weeks.  The patient is interested in proceeding with systemic chemotherapy.  She is expected to start her first dose of this treatment on 10/30/23  We discussed the adverse side effects of treatment including but not limited to alopecia, myelosuppression, nausea and vomiting, peripheral neuropathy, liver or renal dysfunction as well as immunotherapy mediated adverse effects.   I will arrange for the patient to have a chemoeducation  class prior to receiving her first cycle of chemotherapy.   We will arrange for the patient to have a B12 injection while in the clinic today.     I sent prescriptions for 1 mg folic acid p.o. daily as well as Compazine 10 mg every 6 hours as needed for nausea.  They have some questions about her potassium which has been low recently.  I sent a refill of her potassium for 20 mill equivalents to take daily for 4 days.  I also gave her handout of potassium rich food.  The patient will follow-up in 2 weeks for a one-week follow-up visit after completing her first cycle of chemotherapy.  She will establish care with pulmonary medicine on 11/08/23 for management of the effusion. The patient states her breathing is doing ok at this time and she does not have any concerns.   I have placed a referral to a member  the nutritionist team at the patient's request.  Discussed Port-A-Cath placement. She is interested. I have placed the referral. I also sent in a prescription for EMLA cream and reviewed how to use this.   The patient was advised to call immediately if she has any concerning symptoms in the interval. The patient voices understanding of current disease status and treatment options and is in agreement with the current care plan. All questions were answered. The patient knows to call the clinic with any problems, questions or concerns. We can certainly see the patient much sooner if necessary    Orders Placed This Encounter  Procedures   IR IMAGING GUIDED PORT INSERTION    Standing Status:   Future    Expiration Date:   10/22/2024    Reason for Exam (SYMPTOM  OR DIAGNOSIS REQUIRED):   Starting chemotherapy every 3 weeks. Schedule port this week of on off week of treatment. Likely starting on 10/31/23    Preferred Imaging Location?:   [facility]   Ambulatory Referral to [facility] Nutrition    Referral Priority:   Routine    Referral Type:   Consultation    Referral Reason:   Specialty Services Required    Number of Visits Requested:   1     Tayra Dawe L Waldon Sheerin, PA-C 10/23/23  ADDENDUM: Hematology/Oncology Attending: I had a face-to-face encounter with the patient today.  I reviewed her records, lab, scans and recommended her care plan.  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  The patient had molecular studies by [facility] that showed no actionable mutations and insufficient material for PD-L1 expression.  She had a recent PET scan that showed a rim of hypermetabolic pleural thickening within the right lower lobe with associated loculated fluid effusion and pockets of gas as well as atelectasis of the right  lower lobe.  There was atelectatic right lower lobe intensely hypermetabolic with SUV of 10.1 and peripheral hypermetabolic pleural thickening with SUV max of 5.2 and a slightly more superior in the medial right lower lobe a 1.2 cm nodule with SUV max of 16.  The scan also showed small paratracheal and right lower paratracheal nodal stations that were hypermetabolic.  There was no evidence of distant metastatic disease. I had a lengthy discussion with the patient and her daughter today about her current disease stage, prognosis and treatment options. In the absence of any actionable mutations  and insufficient material for PD-L1 expression, I discussed with the patient her treatment options which are palliative in nature including palliative care and hospice versus palliative systemic chemoimmunotherapy with carboplatin for AUC of 5, Alimta 500 Mg/M2 and Keytruda 200 Mg IV every 3 weeks for 4 cycles followed by maintenance treatment with Alimta and Keytruda every 3 weeks.  I discussed with the patient the adverse effect of this treatment including but not limited to alopecia, myelosuppression, nausea and vomiting, peripheral neuropathy, liver or renal dysfunction as well as immunotherapy adverse effects.  The patient is interested in proceeding with systemic therapy.  She will receive vitamin B12 injection today.  Will send prescription of folic acid, Compazine and Emla cream to her pharmacy.  Will arrange for the patient to have Port-A-Cath placed by interventional radiology. The patient will have a chemotherapy education class before starting the first dose of her treatment.  She is expected to start the first dose of this treatment next week. She will come back for follow-up visit in 2 weeks for evaluation and management of any adverse effect of her treatment. The patient was advised to call immediately if she has any other concerning symptoms in the interval. The total time spent in the appointment was 40  minutes. Disclaimer: This note was dictated with voice recognition software. Similar sounding words can inadvertently be transcribed and may be missed upon review. Lajuana Matte, MD

## 2023-10-23 ENCOUNTER — Inpatient Hospital Stay (HOSPITAL_BASED_OUTPATIENT_CLINIC_OR_DEPARTMENT_OTHER): Payer: Medicare Other | Admitting: Physician Assistant

## 2023-10-23 ENCOUNTER — Inpatient Hospital Stay: Payer: Medicare Other

## 2023-10-23 ENCOUNTER — Telehealth: Payer: Self-pay | Admitting: Genetic Counselor

## 2023-10-23 ENCOUNTER — Other Ambulatory Visit: Payer: Self-pay | Admitting: Physician Assistant

## 2023-10-23 VITALS — BP 129/81 | HR 114 | Temp 97.3°F | Resp 18 | Wt 161.1 lb

## 2023-10-23 DIAGNOSIS — C801 Malignant (primary) neoplasm, unspecified: Secondary | ICD-10-CM

## 2023-10-23 DIAGNOSIS — E876 Hypokalemia: Secondary | ICD-10-CM

## 2023-10-23 DIAGNOSIS — C342 Malignant neoplasm of middle lobe, bronchus or lung: Secondary | ICD-10-CM

## 2023-10-23 DIAGNOSIS — C3490 Malignant neoplasm of unspecified part of unspecified bronchus or lung: Secondary | ICD-10-CM | POA: Insufficient documentation

## 2023-10-23 DIAGNOSIS — C3491 Malignant neoplasm of unspecified part of right bronchus or lung: Secondary | ICD-10-CM | POA: Diagnosis not present

## 2023-10-23 DIAGNOSIS — C3431 Malignant neoplasm of lower lobe, right bronchus or lung: Secondary | ICD-10-CM | POA: Diagnosis not present

## 2023-10-23 DIAGNOSIS — J910 Malignant pleural effusion: Secondary | ICD-10-CM

## 2023-10-23 LAB — CBC WITH DIFFERENTIAL (CANCER CENTER ONLY)
Abs Immature Granulocytes: 0.03 10*3/uL (ref 0.00–0.07)
Basophils Absolute: 0 10*3/uL (ref 0.0–0.1)
Basophils Relative: 1 %
Eosinophils Absolute: 0.1 10*3/uL (ref 0.0–0.5)
Eosinophils Relative: 2 %
HCT: 33 % — ABNORMAL LOW (ref 36.0–46.0)
Hemoglobin: 10.9 g/dL — ABNORMAL LOW (ref 12.0–15.0)
Immature Granulocytes: 0 %
Lymphocytes Relative: 20 %
Lymphs Abs: 1.5 10*3/uL (ref 0.7–4.0)
MCH: 27.1 pg (ref 26.0–34.0)
MCHC: 33 g/dL (ref 30.0–36.0)
MCV: 82.1 fL (ref 80.0–100.0)
Monocytes Absolute: 0.9 10*3/uL (ref 0.1–1.0)
Monocytes Relative: 11 %
Neutro Abs: 5 10*3/uL (ref 1.7–7.7)
Neutrophils Relative %: 66 %
Platelet Count: 491 10*3/uL — ABNORMAL HIGH (ref 150–400)
RBC: 4.02 MIL/uL (ref 3.87–5.11)
RDW: 14.6 % (ref 11.5–15.5)
WBC Count: 7.6 10*3/uL (ref 4.0–10.5)
nRBC: 0 % (ref 0.0–0.2)

## 2023-10-23 LAB — CMP (CANCER CENTER ONLY)
ALT: 23 U/L (ref 0–44)
AST: 25 U/L (ref 15–41)
Albumin: 2.5 g/dL — ABNORMAL LOW (ref 3.5–5.0)
Alkaline Phosphatase: 163 U/L — ABNORMAL HIGH (ref 38–126)
Anion gap: 6 (ref 5–15)
BUN: 9 mg/dL (ref 8–23)
CO2: 31 mmol/L (ref 22–32)
Calcium: 8.2 mg/dL — ABNORMAL LOW (ref 8.9–10.3)
Chloride: 100 mmol/L (ref 98–111)
Creatinine: 0.55 mg/dL (ref 0.44–1.00)
GFR, Estimated: >60 mL/min (ref >60)
Glucose, Bld: 136 mg/dL — ABNORMAL HIGH (ref 70–99)
Potassium: 3.3 mmol/L — ABNORMAL LOW (ref 3.5–5.1)
Sodium: 137 mmol/L (ref 135–145)
Total Bilirubin: 0.7 mg/dL (ref 0.0–1.2)
Total Protein: 5.6 g/dL — ABNORMAL LOW (ref 6.5–8.1)

## 2023-10-23 MED ORDER — PROCHLORPERAZINE MALEATE 10 MG PO TABS: 10.0000 mg | ORAL_TABLET | Freq: Four times a day (QID) | ORAL | 2 refills | Status: DC | PRN

## 2023-10-23 MED ORDER — POTASSIUM CHLORIDE CRYS ER 20 MEQ PO TBCR: 20.0000 meq | EXTENDED_RELEASE_TABLET | Freq: Every day | ORAL | 0 refills | Status: DC

## 2023-10-23 MED ORDER — LIDOCAINE-PRILOCAINE 2.5-2.5 % EX CREA: 1.0000 | TOPICAL_CREAM | CUTANEOUS | 2 refills | Status: DC | PRN

## 2023-10-23 MED ORDER — FOLIC ACID 1 MG PO TABS: 1.0000 mg | ORAL_TABLET | Freq: Every day | ORAL | 2 refills | Status: DC

## 2023-10-23 MED ORDER — CYANOCOBALAMIN 1000 MCG/ML IJ SOLN
1000.0000 ug | Freq: Once | INTRAMUSCULAR | Status: AC
  Administered 2023-10-23: 1000 ug via INTRAMUSCULAR
  Filled 2023-10-23: qty 1

## 2023-10-23 NOTE — Patient Instructions (Addendum)
Summary:  -There are two main categories of lung cancer, they are named based on the size of the cancer cell. One is called Non-Small cell lung cancer. The other type is Small Cell Lung Cancer -We covered a lot of important information at your appointment today regarding what the treatment plan is moving forward. Here are the the main points that were discussed at your office visit with [place] today:  -The treatment that you will receive consists of two chemotherapy drugs, called Carboplatin and Alimta (also called Pemetrexed) and one immunotherapy drug called Keytruda (pembrolizumab).  -We are planning on starting your treatment next week on _/_/_ but before your start your treatment, I would like you to attend a Chemotherapy Education Class. This involves having you sit down with one of our nurse educators. She will discuss with your one-on-one more details about your treatment as well as general information about resources here at the cancer center.  -Your treatment will be given once every 3 weeks. We will check your labs once a week for the first ~5 treatments just to make sure that important components of your blood are in an acceptable range -We will get a CT scan after 3 treatments to check on the progress of treatment  Medications:  -I have sent a few important medication prescriptions to your pharmacy.  -Compazine was sent to your pharmacy. This medication is for nausea. You may take this every 6 hours as needed if you feel nauseous.  -I have also sent a prescription for 1 mg of folic acid to your pharmacy. We need you to take 1 tablet every day.  -We will administer vitamin B12 every 9 weeks while you are here in the clinic. You have received your first dose today.   Referrals or Imaging: -I will refer you to IR to put in a port-a-cath.  -I have sent EMLA cream to the pharmacy. You will not be able to use this for the first two weeks of treatment due to the incision site needing to heal (it  will make the incisional glue want to come off prematurely). After 2 weeks, you apply a quarter sized clump on top the the port about 1 hour before your appointment. You do not need to rub the cream in. Just set on top and cover with kitchen saran wrap and come to your appointment. By the time you get here, it will be numb.    Follow up:  -We will see you back for a follow up visit 1 week after your first treatment to see how it went and help manage any side effects of treatment that you may have   -If you need to reach [place] at any time, the main office number to the cancer center is [phone], when you call, ask to speak to either Cassie's or Dr. Asa Lente nurse.

## 2023-10-23 NOTE — Progress Notes (Signed)
START ON PATHWAY REGIMEN - Non-Small Cell Lung     A cycle is every 21 days:     Pembrolizumab      Pemetrexed      Carboplatin   **Always confirm dose/schedule in your pharmacy ordering system**  Patient Characteristics: Stage IV Metastatic, Nonsquamous, Molecular Analysis Completed, Molecular Alteration Present and Targeted Therapy Exhausted OR KRAS G12C+ or HER2+ Present and No Prior Chemo/Immunotherapy OR No Alteration Present, Initial Chemotherapy/Immunotherapy, PS =  0, 1, No Alteration Present, No Alteration Present, Candidate for Immunotherapy, PD-L1 Expression Positive 1-49% (TPS) / Negative / Not Tested / Awaiting Test Results and Immunotherapy Candidate Therapeutic Status: Stage IV Metastatic Histology: Nonsquamous Cell Broad Molecular Profiling Status: Molecular Analysis Completed Molecular Analysis Results: No Alteration Present ECOG Performance Status: 1 Chemotherapy/Immunotherapy Line of Therapy: Initial Chemotherapy/Immunotherapy EGFR Exons 18-21 Mutation Testing Status: Completed and Negative ALK Fusion/Rearrangement Testing Status: Completed and Negative BRAF V600 Mutation Testing Status: Completed and Negative KRAS G12C Mutation Testing Status: Completed and Negative MET Exon 14 Mutation Testing Status: Completed and Negative RET Fusion/Rearrangement Testing Status: Completed and Negative HER2 Mutation Testing Status: Completed and Negative NTRK Fusion/Rearrangement Testing Status: Completed and Negative ROS1 Fusion/Rearrangement Testing Status: Completed and Negative Immunotherapy Candidate Status: Candidate for Immunotherapy PD-L1 Expression Status: Quantity Not Sufficient Intent of Therapy: Non-Curative / Palliative Intent, Discussed with Patient

## 2023-10-23 NOTE — Telephone Encounter (Signed)
Informed patient to contact us to schedule appointments per referral received.

## 2023-10-24 ENCOUNTER — Other Ambulatory Visit: Payer: Self-pay

## 2023-10-24 ENCOUNTER — Telehealth: Payer: Self-pay | Admitting: Genetic Counselor

## 2023-10-24 NOTE — Telephone Encounter (Signed)
Scheduled appointments per 1/28 scheduling message. Spoke to the patients daughter and verified appointment date and times. Patient will be mailed an appointment reminder.

## 2023-10-25 ENCOUNTER — Other Ambulatory Visit: Payer: Self-pay

## 2023-10-26 ENCOUNTER — Inpatient Hospital Stay: Payer: Medicare Other

## 2023-10-29 ENCOUNTER — Inpatient Hospital Stay: Payer: Medicare Other

## 2023-10-29 ENCOUNTER — Encounter: Payer: Self-pay | Admitting: *Deleted

## 2023-10-29 ENCOUNTER — Inpatient Hospital Stay: Payer: Medicare Other | Attending: Physician Assistant | Admitting: Internal Medicine

## 2023-10-29 VITALS — BP 125/79 | HR 110 | Temp 97.8°F | Resp 16 | Ht 66.0 in | Wt 158.9 lb

## 2023-10-29 DIAGNOSIS — Z5112 Encounter for antineoplastic immunotherapy: Secondary | ICD-10-CM | POA: Insufficient documentation

## 2023-10-29 DIAGNOSIS — C3410 Malignant neoplasm of upper lobe, unspecified bronchus or lung: Secondary | ICD-10-CM

## 2023-10-29 DIAGNOSIS — E876 Hypokalemia: Secondary | ICD-10-CM | POA: Diagnosis not present

## 2023-10-29 DIAGNOSIS — C3431 Malignant neoplasm of lower lobe, right bronchus or lung: Secondary | ICD-10-CM | POA: Insufficient documentation

## 2023-10-29 DIAGNOSIS — C342 Malignant neoplasm of middle lobe, bronchus or lung: Secondary | ICD-10-CM

## 2023-10-29 DIAGNOSIS — Z9049 Acquired absence of other specified parts of digestive tract: Secondary | ICD-10-CM | POA: Diagnosis not present

## 2023-10-29 DIAGNOSIS — Z79620 Long term (current) use of immunosuppressive biologic: Secondary | ICD-10-CM | POA: Insufficient documentation

## 2023-10-29 DIAGNOSIS — J910 Malignant pleural effusion: Secondary | ICD-10-CM | POA: Insufficient documentation

## 2023-10-29 DIAGNOSIS — Z5111 Encounter for antineoplastic chemotherapy: Secondary | ICD-10-CM | POA: Insufficient documentation

## 2023-10-29 DIAGNOSIS — K909 Intestinal malabsorption, unspecified: Secondary | ICD-10-CM | POA: Insufficient documentation

## 2023-10-29 LAB — CMP (CANCER CENTER ONLY)
ALT: 10 U/L (ref 0–44)
AST: 10 U/L — ABNORMAL LOW (ref 15–41)
Albumin: 2.6 g/dL — ABNORMAL LOW (ref 3.5–5.0)
Alkaline Phosphatase: 146 U/L — ABNORMAL HIGH (ref 38–126)
Anion gap: 6 (ref 5–15)
BUN: 6 mg/dL — ABNORMAL LOW (ref 8–23)
CO2: 32 mmol/L (ref 22–32)
Calcium: 8.1 mg/dL — ABNORMAL LOW (ref 8.9–10.3)
Chloride: 98 mmol/L (ref 98–111)
Creatinine: 0.51 mg/dL (ref 0.44–1.00)
GFR, Estimated: >60 mL/min (ref >60)
Glucose, Bld: 127 mg/dL — ABNORMAL HIGH (ref 70–99)
Potassium: 2.7 mmol/L — CL (ref 3.5–5.1)
Sodium: 136 mmol/L (ref 135–145)
Total Bilirubin: 0.8 mg/dL (ref 0.0–1.2)
Total Protein: 5.8 g/dL — ABNORMAL LOW (ref 6.5–8.1)

## 2023-10-29 LAB — TSH: TSH: 1.471 u[IU]/mL (ref 0.350–4.500)

## 2023-10-29 LAB — CBC WITH DIFFERENTIAL (CANCER CENTER ONLY)
Abs Immature Granulocytes: 0.04 10*3/uL (ref 0.00–0.07)
Basophils Absolute: 0.1 10*3/uL (ref 0.0–0.1)
Basophils Relative: 1 %
Eosinophils Absolute: 0 10*3/uL (ref 0.0–0.5)
Eosinophils Relative: 0 %
HCT: 32.6 % — ABNORMAL LOW (ref 36.0–46.0)
Hemoglobin: 10.6 g/dL — ABNORMAL LOW (ref 12.0–15.0)
Immature Granulocytes: 1 %
Lymphocytes Relative: 16 %
Lymphs Abs: 1.4 10*3/uL (ref 0.7–4.0)
MCH: 26.6 pg (ref 26.0–34.0)
MCHC: 32.5 g/dL (ref 30.0–36.0)
MCV: 81.9 fL (ref 80.0–100.0)
Monocytes Absolute: 0.7 10*3/uL (ref 0.1–1.0)
Monocytes Relative: 8 %
Neutro Abs: 6.6 10*3/uL (ref 1.7–7.7)
Neutrophils Relative %: 74 %
Platelet Count: 451 10*3/uL — ABNORMAL HIGH (ref 150–400)
RBC: 3.98 MIL/uL (ref 3.87–5.11)
RDW: 15 % (ref 11.5–15.5)
WBC Count: 8.8 10*3/uL (ref 4.0–10.5)
nRBC: 0 % (ref 0.0–0.2)

## 2023-10-29 MED FILL — Fosaprepitant Dimeglumine For IV Infusion 150 MG (Base Eq): INTRAVENOUS | Qty: 5 | Status: AC

## 2023-10-29 NOTE — Progress Notes (Signed)
CRITICAL VALUE STICKER  CRITICAL VALUE: Potassium 2.7  RECEIVER (on-site recipient of call): Huntley Dec, RN   DATE & TIME NOTIFIED: 10/29/23 10am  MESSENGER (representative from lab): [name]  MD NOTIFIED: Arbutus Ped  TIME OF NOTIFICATION: 10am  RESPONSE:  Will address in OV

## 2023-10-29 NOTE — Progress Notes (Signed)
[facility] Telephone:([phone]) [phone]   Fax:([phone]  OFFICE PROGRESS NOTE  Richmond Campbell., PA-C [address] [place] [address]  DIAGNOSIS: Stage IV non-small cell lung cancer, adenocarcinoma. The patient presented with pleural thickening of the right lower lobe as well as malignant effusion.  She was diagnosed in January 2025   Molecular Studies: Biomarker Findings HRD signature - Cannot Be Determined Microsatellite status - Cannot Be Determined ? Tumor Mutational Burden - Cannot Be Determined Genomic Findings For a complete list of the genes assayed, please refer to the Appendix. KRAS G12D DNMT3A F48fs*7 SMAD4 R361H TET2 Q1525fs*31, W1191* 7 Disease relevant genes with no reportable alterations: ALK, BRAF, EGFR, ERBB2, MET, RET, ROS1   PRIOR THERAPY: None   CURRENT THERAPY: Palliative systemic chemotherapy with carboplatin for an AUC of 5, Alimta 500 mg/m, and Keytruda 20 mg IV every 3 weeks.  First dose expected next week on 10/30/2023.  INTERVAL HISTORY: Amanda Potter 78 y.o. female returns to the clinic today for follow-up visit accompanied by her daughter.Discussed the use of AI scribe software for clinical note transcription with the patient, who gave verbal consent to proceed.  History of Present Illness   The patient is a 78 year old female with stage four non-small cell lung cancer adenocarcinoma who presents for pre-chemotherapy evaluation. She is accompanied by her daughter.  Diagnosed with stage four non-small cell lung cancer adenocarcinoma in January 2025, she is scheduled to begin chemotherapy with carboplatin, pemetrexed (Alimta), and pembrolizumab (Keytruda) every three weeks, starting tomorrow. Molecular marker testing did not reveal any actionable mutations.  She experiences persistent fatigue but denies nausea, vomiting, or diarrhea. Her potassium level is critically low at 2.7 mEq/L, and she does not use diuretics or  fluid pills. She has a history of partial colon resection, which may affect absorption. She consumes high protein shakes, specifically Atkins Strong, to support her nutritional intake.       MEDICAL HISTORY: Past Medical History:  Diagnosis Date   Deviated nasal septum    Environmental allergies    GERD (gastroesophageal reflux disease)    Hyperlipidemia    Hypertension    Perforation of colon (HCC) 2006   Following colonoscopy   Sinusitis, acute     ALLERGIES:  is allergic to tape.  MEDICATIONS:  Current Outpatient Medications  Medication Sig Dispense Refill   albuterol (VENTOLIN HFA) 108 (90 Base) MCG/ACT inhaler Inhale 1 puff into the lungs every 6 (six) hours as needed for shortness of breath.     aspirin 81 MG tablet Take 81 mg by mouth daily at 12 noon.      Cholecalciferol 1.25 MG (50000 UT) capsule Take 50,000 Units by mouth daily.     diphenhydrAMINE (BENADRYL) 25 MG tablet Take 25 mg by mouth daily as needed for allergies.     Ferrous Sulfate (SLOW FE PO) Take 1 tablet by mouth daily.     folic acid (FOLVITE) 1 MG tablet Take 1 tablet (1 mg total) by mouth daily. 30 tablet 2   lidocaine-prilocaine (EMLA) cream Apply 1 Application topically as needed. 30 g 2   methimazole (TAPAZOLE) 10 MG tablet Take 2 tablets (20 mg total) by mouth daily. 180 tablet 0   naproxen sodium (ANAPROX) 220 MG tablet Take 220 mg by mouth 2 (two) times daily as needed (for pain).     omeprazole (PRILOSEC) 20 MG capsule Take 20 mg by mouth daily.     Polyvinyl Alcohol-Povidone (  REFRESH OP) Place 1 drop into both eyes daily as needed (dry eyes).     potassium chloride SA (KLOR-CON M) 20 MEQ tablet Take 1 tablet (20 mEq total) by mouth daily. 4 tablet 0   prochlorperazine (COMPAZINE) 10 MG tablet Take 1 tablet (10 mg total) by mouth every 6 (six) hours as needed. 30 tablet 2   simvastatin (ZOCOR) 20 MG tablet Take 20 mg by mouth at bedtime.      No current facility-administered medications for this  visit.    SURGICAL HISTORY:  Past Surgical History:  Procedure Laterality Date   CHOLECYSTECTOMY     COLON SURGERY Right 2006   hemicolectomy   COLONOSCOPY WITH PROPOFOL N/A 05/30/2019   Procedure: COLONOSCOPY WITH PROPOFOL;  Surgeon: Jeani Hawking, MD;  Location: [facility];  Service: Endoscopy;  Laterality: N/A;   ENTEROSCOPY N/A 05/30/2019   Procedure: ENTEROSCOPY;  Surgeon: Jeani Hawking, MD;  Location: [facility];  Service: Endoscopy;  Laterality: N/A;   HERNIA REPAIR  [id number]   INCISIONAL HERNIA REPAIR N/A 03/03/2013   Procedure: LAPAROSCOPIC INCISIONAL HERNIA REPAIR;  Surgeon: Adolph Pollack, MD;  Location: [facility];  Service: General;  Laterality: N/A;  LYSIS OF ADHESIONS FOR 60 MINUTES   INSERTION OF MESH N/A 03/03/2013   Procedure: INSERTION OF MESH;  Surgeon: Adolph Pollack, MD;  Location: [facility];  Service: General;  Laterality: N/A;   POLYPECTOMY  05/30/2019   Procedure: POLYPECTOMY;  Surgeon: Jeani Hawking, MD;  Location: [facility];  Service: Endoscopy;;   TONSILLECTOMY     TUBAL LIGATION      REVIEW OF SYSTEMS:  Constitutional: positive for fatigue Eyes: negative Ears, nose, mouth, throat, and face: negative Respiratory: negative Cardiovascular: negative Gastrointestinal: negative Genitourinary:negative Integument/breast: negative Hematologic/lymphatic: negative Musculoskeletal:negative Neurological: negative Behavioral/Psych: negative Endocrine: negative Allergic/Immunologic: negative   PHYSICAL EXAMINATION: General appearance: alert, cooperative, fatigued, and no distress Head: Normocephalic, without obvious abnormality, atraumatic Neck: no adenopathy, no JVD, supple, symmetrical, trachea midline, and thyroid not enlarged, symmetric, no tenderness/mass/nodules Lymph nodes: Cervical, supraclavicular, and axillary nodes normal. Resp: clear to auscultation bilaterally Back: symmetric, no curvature. ROM normal. No CVA tenderness. Cardio: regular rate and  rhythm, S1, S2 normal, no murmur, click, rub or gallop GI: soft, non-tender; bowel sounds normal; no masses,  no organomegaly Extremities: extremities normal, atraumatic, no cyanosis or edema Neurologic: Alert and oriented X 3, normal strength and tone. Normal symmetric reflexes. Normal coordination and gait  ECOG PERFORMANCE STATUS: 1 - Symptomatic but completely ambulatory  Blood pressure 125/79, pulse (!) 110, temperature 97.8 F (36.6 C), temperature source Temporal, resp. rate 16, height 5\' 6"  (1.676 m), weight 158 lb 14.4 oz (72.1 kg), SpO2 97%.  LABORATORY DATA: Lab Results  Component Value Date   WBC 8.8 10/29/2023   HGB 10.6 (L) 10/29/2023   HCT 32.6 (L) 10/29/2023   MCV 81.9 10/29/2023   PLT 451 (H) 10/29/2023      Chemistry      Component Value Date/Time   NA 136 10/29/2023 0921   K 2.7 (LL) 10/29/2023 0921   CL 98 10/29/2023 0921   CO2 32 10/29/2023 0921   BUN 6 (L) 10/29/2023 0921   CREATININE 0.51 10/29/2023 0921      Component Value Date/Time   CALCIUM 8.1 (L) 10/29/2023 0921   ALKPHOS 146 (H) 10/29/2023 0921   AST 10 (L) 10/29/2023 0921   ALT 10 10/29/2023 0921   BILITOT 0.8 10/29/2023 0921       RADIOGRAPHIC STUDIES: NM PET  Image Initial (PI) Skull Base To Thigh Result Date: 10/22/2023 CLINICAL DATA:  Initial treatment strategy for malignant effusion. Metastatic adenocarcinoma of unclear primary. EXAM: NUCLEAR MEDICINE PET SKULL BASE TO THIGH TECHNIQUE: 7.6 mCi F-18 FDG was injected intravenously. Full-ring PET imaging was performed from the skull base to thigh after the radiotracer. CT data was obtained and used for attenuation correction and anatomic localization. Fasting blood glucose: 85 mg/dl COMPARISON:  None Available. FINDINGS: Mediastinal blood pool activity: SUV max 1.8 Liver activity: SUV max NA NECK: No hypermetabolic lymph nodes in the neck. Incidental CT findings: None. CHEST: Rim of hypermetabolic pleural thickening within the RIGHT lower lobe  there is associated with a loculated few effusion with pockets of gas as well as atelectasis of the RIGHT lower lobe. Atelectatic RIGHT lower lobe is intensely hypermetabolic with SUV max equal 10.1. Peripheral hypermetabolic pleural thickening with SUV max equal 5.2. Slightly more superior in the medial RIGHT lower lobe 17 mm in mild mm nodule image 62/4 with SUV max equal 16. Elsewhere within the RIGHT hemithorax there is scattered hypermetabolic pleural thickening. For example in the medial RIGHT upper lobe hypermetabolic pleural thickening with SUV max equal 4.5 on image 52. The mediastinum small paratracheal lymph nodes are moderately hypermetabolic SUV max equal 4.2 in the RIGHT lower paratracheal nodal station on image 64. No hypermetabolic supraclavicular nodes. Incidental CT findings: None. ABDOMEN/PELVIS: No abnormal hypermetabolic activity within the liver, pancreas, adrenal glands, or spleen. No hypermetabolic lymph nodes in the abdomen or pelvis. Incidental CT findings: None. SKELETON: No focal hypermetabolic activity to suggest skeletal metastasis. Incidental CT findings: None. IMPRESSION: 1. And rim of hypermetabolic pleural thickening in the RIGHT lower lobe with intensely hypermetabolic atelectasis and nodularity in RIGHT lower lobe. Concern for primary pleural or lung malignancy including mesothelioma. 2. Loculated effusion in the RIGHT hemithorax with pockets of gas gas. Potential superimposed empyema. 3. Small mildly hypermetabolic mediastinal lymph nodes are indeterminate. 4. No evidence distant metastatic disease. Electronically Signed   By: Genevive Bi M.D.   On: 10/22/2023 11:27   MR BRAIN W WO CONTRAST Result Date: 10/18/2023 CLINICAL DATA:  Provided history: Malignant pleural effusion. Adenocarcinoma. Metastatic disease evaluation. EXAM: MRI HEAD WITHOUT AND WITH CONTRAST TECHNIQUE: Multiplanar, multiecho pulse sequences of the brain and surrounding structures were obtained without  and with intravenous contrast. CONTRAST:  7mL GADAVIST GADOBUTROL 1 MMOL/ML IV SOLN COMPARISON:  None. FINDINGS: Brain: Mild generalized cerebral atrophy. Multifocal T2 FLAIR hyperintense signal abnormality within the cerebral white matter, nonspecific but compatible with moderate chronic small vessel ischemic disease. Chronic microhemorrhage within the right corona radiata/basal ganglia. There is no acute infarct. No evidence of an intracranial mass. No extra-axial fluid collection. No midline shift. No pathologic intracranial enhancement identified. Vascular: Maintained flow voids within the proximal large arterial vessels. Skull and upper cervical spine: No focal worrisome marrow lesion. Incompletely assessed cervical spondylosis. Sinuses/Orbits: No mass or acute finding within the imaged orbits. Prior bilateral ocular lens replacement. Minimal mucosal thickening within a right ethmoid air cell posteriorly. IMPRESSION: 1. No evidence of intracranial metastatic disease. 2. Moderate chronic small vessel ischemic changes within the cerebral white matter. 3. Mild generalized cerebral atrophy. Electronically Signed   By: Jackey Loge D.O.   On: 10/18/2023 07:57   DG CHEST PORT 1 VIEW Result Date: 10/04/2023 CLINICAL DATA:  Chest tube removal. EXAM: PORTABLE CHEST 1 VIEW COMPARISON:  10/03/2023 and older studies. FINDINGS: Small right apical pneumothorax is stable from the previous day's exam. Right lower lateral  hemithorax pneumothorax is less apparent, presume smaller. Opacity at the right lung base is stable. No new lung abnormalities. Stable hiatal hernia. IMPRESSION: 1. Small residual pneumothorax at the right lateral lung base is less apparent and presumed smaller. No change in the small right apical pneumothorax. 2. No other change from the previous day's exam. Electronically Signed   By: Amie Portland M.D.   On: 10/04/2023 07:32   CT CHEST ABDOMEN PELVIS W CONTRAST Result Date: 10/03/2023 CLINICAL DATA:   Metastatic disease evaluation. History of perforated colon. EXAM: CT CHEST, ABDOMEN, AND PELVIS WITH CONTRAST TECHNIQUE: Multidetector CT imaging of the chest, abdomen and pelvis was performed following the standard protocol during bolus administration of intravenous contrast. RADIATION DOSE REDUCTION: This exam was performed according to the departmental dose-optimization program which includes automated exposure control, adjustment of the mA and/or kV according to patient size and/or use of iterative reconstruction technique. CONTRAST:  75mL OMNIPAQUE IOHEXOL 350 MG/ML SOLN COMPARISON:  Chest CT 09/27/2023. Report only CT abdomen and pelvis 02/04/2013. FINDINGS: CT CHEST FINDINGS Cardiovascular: No significant vascular findings. Heart is mildly enlarged. No pericardial effusion. There are atherosclerotic calcifications of the aorta. Mediastinum/Nodes: There subcentimeter hypodense thyroid nodules bilaterally. There is an enlarged precarinal lymph node measuring 11 mm. No other enlarged mediastinal, hilar or axillary lymph nodes are seen. The visualized esophagus is within normal limits. There is a moderate-sized hiatal hernia containing the proximal stomach. Lungs/Pleura: Right chest tube has been removed. There is a small right-sided hydropneumothorax persisting with multiple air-fluid levels in the pleural space. Pleural fluid has increased from prior. There is mild enhancement of the pleura. There is right lower lobe atelectasis/airspace disease. Bands of opacity in the right upper lobe and right lower lobe are favored is atelectasis. Patchy ground-glass opacities in the right middle lobe have resolved in the interval. The left lung is grossly clear. Trachea and central airways are patent. Musculoskeletal: Twos 1 right chest wall emphysema is present. CT ABDOMEN PELVIS FINDINGS Hepatobiliary: No focal liver abnormality is seen. Status post cholecystectomy. No biliary dilatation. Pancreas: Unremarkable. No  pancreatic ductal dilatation or surrounding inflammatory changes. Spleen: Normal in size without focal abnormality. Adrenals/Urinary Tract: Adrenal glands are unremarkable. Kidneys are normal, without renal calculi, focal lesion, or hydronephrosis. Bladder is unremarkable. Stomach/Bowel: There is a large hiatal hernia containing the proximal stomach. No evidence of bowel wall thickening, distention, or inflammatory changes. There is sigmoid and descending colon diverticulosis. The appendix is surgically absent. Vascular/Lymphatic: Aortic atherosclerosis. No enlarged abdominal or pelvic lymph nodes. Reproductive: Partially calcified uterine fibroids are present measuring up to 10 mm. Adnexa are within normal limits. Other: Patient is status post anterior abdominal wall hernia repair with mesh. Mild hernia persists containing nondilated small bowel loops. There is no ascites. Musculoskeletal: No acute or significant osseous findings. IMPRESSION: 1. Interval removal of right chest tube. Small right-sided hydropneumothorax persists with multiple air-fluid levels in the pleural space. Pleural fluid has increased from prior. There is mild enhancement of the pleura. Empyema not excluded in the appropriate clinical setting. 2. Right lower lobe atelectasis/airspace disease. 3. Mildly enlarged precarinal lymph node. 4. No acute localizing process in the abdomen or pelvis. 5. Large hiatal hernia containing the proximal stomach. 6. Colonic diverticulosis. 7. Uterine fibroids. 8. Aortic atherosclerosis. Aortic Atherosclerosis (ICD10-I70.0). Electronically Signed   By: Darliss Cheney M.D.   On: 10/03/2023 17:50   DG CHEST PORT 1 VIEW Result Date: 10/03/2023 CLINICAL DATA:  Chest tube removal EXAM: PORTABLE CHEST 1 VIEW COMPARISON:  10/03/2023, 10/01/2023 FINDINGS: Interim removal of right-sided chest tube. Small residual right apical and CP angle pneumothorax. Mild atelectasis at right base. Hiatal hernia. Small volume gas in  the right chest wall soft tissues. Stable cardiomediastinal silhouette with aortic atherosclerosis. IMPRESSION: Interim removal of right-sided chest tube with stable small residual right apical and CP angle pneumothorax. Subsegmental atelectasis at the right base Electronically Signed   By: Jasmine Pang M.D.   On: 10/03/2023 15:46   DG Chest Port 1 View Result Date: 10/03/2023 CLINICAL DATA:  [id number] Respiratory failure (HCC) [phone] EXAM: PORTABLE CHEST 1 VIEW COMPARISON:  Chest x-ray 10/01/2023, CT abdomen pelvis 02/04/2013 FINDINGS: Right chest tube pigtail catheter overlies the right lateral mid hemithorax. Associated subcutaneus soft tissue emphysema. The heart and mediastinal contours are unchanged. Atherosclerotic plaque. Round density overlying the lower mediastinal on the left consistent with known hiatal hernia. Right base atelectasis. No definite focal consolidation. No pulmonary edema. No pleural effusion. Residual at least trace volume stable right pneumothorax. No left pneumothorax. No acute osseous abnormality. IMPRESSION: 1. Residual at least trace volume stable right pneumothorax. 2. Right chest tube pigtail catheter overlies the right lateral mid hemithorax. 3. Hiatal hernia. Electronically Signed   By: Tish Frederickson M.D.   On: 10/03/2023 09:48   DG CHEST PORT 1 VIEW Result Date: 10/01/2023 CLINICAL DATA:  Chest tube EXAM: PORTABLE CHEST - 1 VIEW COMPARISON:  the previous day's study FINDINGS: Stable right lateral pigtail chest tube, with small adjacent residual lateral pneumothorax. minimal regional subcutaneous emphysema. Some patchy airspace opacities laterally at the right lung base, and in the infrahilar regions left greater than right. Heart size and mediastinal contours are within normal limits. Aortic Atherosclerosis (ICD10-170.0). No effusion. Bilateral shoulder DJD.  Cholecystectomy clips. IMPRESSION: Stable right chest tube with tiny lateral residual pneumothorax. Electronically  Signed   By: Corlis Leak M.D.   On: 10/01/2023 10:24   DG CHEST PORT 1 VIEW Result Date: 09/30/2023 CLINICAL DATA:  Follow-up pneumothorax. Pleural effusion. Pneumonia. EXAM: PORTABLE CHEST 1 VIEW COMPARISON:  09/29/2023 FINDINGS: There is a right basilar pigtail thoracostomy tube in similar position to the previous exam. Tiny right apical pneumothorax is visualized on the current exam measuring approximately 6 mm over the apex. Improved right mid and right lower lung opacities. No new findings. IMPRESSION: 1. Stable position of right-sided chest tube. Tiny right apical pneumothorax measuring approximately 6 mm over the apex. 2. Improved right mid and right lower lung opacities. Electronically Signed   By: Signa Kell M.D.   On: 09/30/2023 09:47    ASSESSMENT AND PLAN: This is a very pleasant 78 years old white female with Stage IV non-small cell lung cancer, adenocarcinoma. The patient presented with pleural thickening of the right lower lobe as well as malignant effusion.  She was diagnosed in January 2025 Molecular studies by foundation 1 showed no actionable mutations. The patient is expected to start the first cycle of systemic chemotherapy with carboplatin for AUC of 5, Alimta 500 Mg/M2 and Keytruda 200 Mg IV every 3 weeks tomorrow.    Stage IV Non-Small Cell Lung Cancer (Adenocarcinoma) Diagnosed January 2025. No actionable molecular markers. Scheduled to start first cycle of chemotherapy with carboplatin, pemetrexed (Alimta), and pembrolizumab (Keytruda) tomorrow. Reports fatigue, no nausea, vomiting, or diarrhea. Initial treatment via peripheral IV; port placement pending. Emphasized antiemetics and nutritional support. Informed about regular potassium monitoring due to malabsorption. - Administer first cycle of chemotherapy with carboplatin, pemetrexed, and pembrolizumab - Schedule port placement - Continue  daily folic acid - Ensure availability of antiemetics (Compazine,  Zofran)  Hypokalemia Potassium level 2.7 mEq/L. No diarrhea or diuretics. 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 Discussed dietary potassium sources and weekly monitoring. - Prescribe potassium supplements (40 mEq daily for seven days) - Encourage potassium-rich foods (orange juice, bananas, sweet potatoes) - Check potassium levels weekly  Nutritional Support Partial colectomy causing potential malabsorption. Using high-protein shakes (Atkins Strong) to supplement nutrition. Discussed maintaining nutritional intake for health and treatment tolerance. - Continue high-protein shakes as tolerated  Follow-up - Cancel next week's visit - Keep lab appointment next week - Schedule follow-up visit in three weeks.   He was advised to call immediately if she has any concerning symptoms in the interval. The patient voices understanding of current disease status and treatment options and is in agreement with the current care plan.  All questions were answered. The patient knows to call the clinic with any problems, questions or concerns. We can certainly see the patient much sooner if necessary.  The total time spent in the appointment was 30 minutes.  Disclaimer: This note was dictated with voice recognition software. Similar sounding words can inadvertently be transcribed and may not be corrected upon review.

## 2023-10-30 ENCOUNTER — Telehealth: Payer: Self-pay | Admitting: *Deleted

## 2023-10-30 ENCOUNTER — Inpatient Hospital Stay: Payer: Medicare Other

## 2023-10-30 ENCOUNTER — Other Ambulatory Visit: Payer: Self-pay | Admitting: Physician Assistant

## 2023-10-30 ENCOUNTER — Telehealth: Payer: Self-pay | Admitting: Physician Assistant

## 2023-10-30 VITALS — BP 114/65 | HR 86 | Temp 98.3°F | Resp 18

## 2023-10-30 DIAGNOSIS — E876 Hypokalemia: Secondary | ICD-10-CM

## 2023-10-30 DIAGNOSIS — C342 Malignant neoplasm of middle lobe, bronchus or lung: Secondary | ICD-10-CM

## 2023-10-30 DIAGNOSIS — Z5111 Encounter for antineoplastic chemotherapy: Secondary | ICD-10-CM | POA: Diagnosis not present

## 2023-10-30 DIAGNOSIS — C801 Malignant (primary) neoplasm, unspecified: Secondary | ICD-10-CM

## 2023-10-30 DIAGNOSIS — J910 Malignant pleural effusion: Secondary | ICD-10-CM

## 2023-10-30 LAB — T4: T4, Total: 7.9 ug/dL (ref 4.5–12.0)

## 2023-10-30 MED ORDER — SODIUM CHLORIDE 0.9 % IV SOLN
388.0000 mg | Freq: Once | INTRAVENOUS | Status: AC
  Administered 2023-10-30: 390 mg via INTRAVENOUS
  Filled 2023-10-30: qty 39

## 2023-10-30 MED ORDER — DEXAMETHASONE SODIUM PHOSPHATE 10 MG/ML IJ SOLN
10.0000 mg | Freq: Once | INTRAMUSCULAR | Status: AC
Start: 2023-10-30 — End: 2023-10-30
  Administered 2023-10-30: 10 mg via INTRAVENOUS
  Filled 2023-10-30: qty 1

## 2023-10-30 MED ORDER — SODIUM CHLORIDE 0.9 % IV SOLN: INTRAVENOUS | Status: DC

## 2023-10-30 MED ORDER — PALONOSETRON HCL INJECTION 0.25 MG/5ML
0.2500 mg | Freq: Once | INTRAVENOUS | Status: AC
  Administered 2023-10-30: 0.25 mg via INTRAVENOUS
  Filled 2023-10-30: qty 5

## 2023-10-30 MED ORDER — POTASSIUM CHLORIDE CRYS ER 20 MEQ PO TBCR: 20.0000 meq | EXTENDED_RELEASE_TABLET | Freq: Two times a day (BID) | ORAL | 0 refills | Status: DC

## 2023-10-30 MED ORDER — SODIUM CHLORIDE 0.9 % IV SOLN
150.0000 mg | Freq: Once | INTRAVENOUS | Status: AC
  Administered 2023-10-30: 150 mg via INTRAVENOUS
  Filled 2023-10-30: qty 150

## 2023-10-30 MED ORDER — SODIUM CHLORIDE 0.9 % IV SOLN
500.0000 mg/m2 | Freq: Once | INTRAVENOUS | Status: AC
  Administered 2023-10-30: 900 mg via INTRAVENOUS
  Filled 2023-10-30: qty 20

## 2023-10-30 MED ORDER — SODIUM CHLORIDE 0.9 % IV SOLN
200.0000 mg | Freq: Once | INTRAVENOUS | Status: AC
  Administered 2023-10-30: 200 mg via INTRAVENOUS
  Filled 2023-10-30: qty 200

## 2023-10-30 NOTE — Telephone Encounter (Signed)
Left message on pt daughter personal phone to inform that K+ rx was sent to pt preferred pharmacy. Advised to call office if there are any questions.

## 2023-10-30 NOTE — Telephone Encounter (Signed)
Sent a different prescription for potassium

## 2023-10-30 NOTE — Progress Notes (Signed)
Ok to treat D1/C1 Keytruda/Alimta/Carbo with HR 111 per Dr. Arbutus Ped

## 2023-10-30 NOTE — Telephone Encounter (Signed)
 I attempted to call the patient but was not able to reach. I left a voicemail. I received refill request for potassium from a prior prescription that was sent last week. Per chart review, it looks like she was supposed to start 40 meq p.o daily of potassium after her office visit yesterday. I was calling to see if they were able to pick that up from the pharmacy as I did not see this in her medication list. In case this was not at the pharmacy, I did send prescription today. I left our number to call back if questions.

## 2023-10-30 NOTE — Patient Instructions (Signed)
[facility] - A DEPT OF [facility]  Discharge Instructions: Thank you for choosing [facility] to provide your oncology and hematology care.   If you have a lab appointment with the [facility], please go directly to the [facility] and check in at the registration area.   Wear comfortable clothing and clothing appropriate for easy access to any Portacath or PICC line.   We strive to give you quality time with your provider. You may need to reschedule your appointment if you arrive late (15 or more minutes).  Arriving late affects you and other patients whose appointments are after yours.  Also, if you miss three or more appointments without notifying the office, you may be dismissed from the clinic at the provider's discretion.      For prescription refill requests, have your pharmacy contact our office and allow 72 hours for refills to be completed.    Today you received the following chemotherapy and/or immunotherapy agents  Pembrolizumab (Keytruda), Pemetrexed (Alimta) and Carboplatin    To help prevent nausea and vomiting after your treatment, we encourage you to take your nausea medication as directed.  BELOW ARE SYMPTOMS THAT SHOULD BE REPORTED IMMEDIATELY: *FEVER GREATER THAN 100.4 F (38 C) OR HIGHER *CHILLS OR SWEATING *NAUSEA AND VOMITING THAT IS NOT CONTROLLED WITH YOUR NAUSEA MEDICATION *UNUSUAL SHORTNESS OF BREATH *UNUSUAL BRUISING OR BLEEDING *URINARY PROBLEMS (pain or burning when urinating, or frequent urination) *BOWEL PROBLEMS (unusual diarrhea, constipation, pain near the anus) TENDERNESS IN MOUTH AND THROAT WITH OR WITHOUT PRESENCE OF ULCERS (sore throat, sores in mouth, or a toothache) UNUSUAL RASH, SWELLING OR PAIN  UNUSUAL VAGINAL DISCHARGE OR ITCHING   Items with * indicate a potential emergency and should be followed up as soon as possible or go to the Emergency Department if any problems should occur.  Please  show the CHEMOTHERAPY ALERT CARD or IMMUNOTHERAPY ALERT CARD at check-in to the Emergency Department and triage nurse.  Should you have questions after your visit or need to cancel or reschedule your appointment, please contact [facility] - A DEPT OF [facility]  Dept: [phone]  and follow the prompts.  Office hours are 8:00 a.m. to 4:30 p.m. Monday - Friday. Please note that voicemails left after 4:00 p.m. may not be returned until the following business day.  We are closed weekends and major holidays. You have access to a nurse at all times for urgent questions. Please call the main number to the clinic Dept: [phone] and follow the prompts.   For any non-urgent questions, you may also contact your provider using MyChart. We now offer e-Visits for anyone 68 and older to request care online for non-urgent symptoms. For details visit [email or web address].   Also download the MyChart app! Go to the app store, search "MyChart", open the app, select [place], and log in with your MyChart username and password.  Pembrolizumab Injection What is this medication? PEMBROLIZUMAB (PEM broe LIZ ue mab) treats some types of cancer. It works by helping your immune system slow or stop the spread of cancer cells. It is a monoclonal antibody. This medicine may be used for other purposes; ask your health care provider or pharmacist if you have questions. COMMON BRAND NAME(S): Keytruda What should I tell my care team before I take this medication? They need to know if you have any of these conditions: Allogeneic stem cell transplant (uses  someone else's stem cells) Autoimmune diseases, such as Crohn disease, ulcerative colitis, lupus History of chest radiation Nervous system problems, such as Guillain-Barre syndrome, myasthenia gravis Organ transplant An unusual or allergic reaction to pembrolizumab, other medications, foods, dyes, or preservatives Pregnant or  trying to get pregnant Breast-feeding How should I use this medication? This medication is injected into a vein. It is given by your care team in a hospital or clinic setting. A special MedGuide will be given to you before each treatment. Be sure to read this information carefully each time. Talk to your care team about the use of this medication in children. While it may be prescribed for children as young as 6 months for selected conditions, precautions do apply. Overdosage: If you think you have taken too much of this medicine contact a poison control [facility] or emergency room at once. NOTE: This medicine is only for you. Do not share this medicine with others. What if I miss a dose? Keep appointments for follow-up doses. It is important not to miss your dose. Call your care team if you are unable to keep an appointment. What may interact with this medication? Interactions have not been studied. This list may not describe all possible interactions. Give your health care provider a list of all the medicines, herbs, non-prescription drugs, or dietary supplements you use. Also tell them if you smoke, drink alcohol, or use illegal drugs. Some items may interact with your medicine. What should I watch for while using this medication? Your condition will be monitored carefully while you are receiving this medication. You may need blood work while taking this medication. This medication may cause serious skin reactions. They can happen weeks to months after starting the medication. Contact your care team right away if you notice fevers or flu-like symptoms with a rash. The rash may be red or purple and then turn into blisters or peeling of the skin. You may also notice a red rash with swelling of the face, lips, or lymph nodes in your neck or under your arms. Tell your care team right away if you have any change in your eyesight. Talk to your care team if you may be pregnant. Serious birth defects can  occur if you take this medication during pregnancy and for 4 months after the last dose. You will need a negative pregnancy test before starting this medication. Contraception is recommended while taking this medication and for 4 months after the last dose. Your care team can help you find the option that works for you. Do not breastfeed while taking this medication and for 4 months after the last dose. What side effects may I notice from receiving this medication? Side effects that you should report to your care team as soon as possible: Allergic reactions--skin rash, itching, hives, swelling of the face, lips, tongue, or throat Dry cough, shortness of breath or trouble breathing Eye pain, redness, irritation, or discharge with blurry or decreased vision Heart muscle inflammation--unusual weakness or fatigue, shortness of breath, chest pain, fast or irregular heartbeat, dizziness, swelling of the ankles, feet, or hands Hormone gland problems--headache, sensitivity to light, unusual weakness or fatigue, dizziness, fast or irregular heartbeat, increased sensitivity to cold or heat, excessive sweating, constipation, hair loss, increased thirst or amount of urine, tremors or shaking, irritability Infusion reactions--chest pain, shortness of breath or trouble breathing, feeling faint or lightheaded Kidney injury (glomerulonephritis)--decrease in the amount of urine, red or dark brown urine, foamy or bubbly urine, swelling of the  ankles, hands, or feet Liver injury--right upper belly pain, loss of appetite, nausea, light-colored stool, dark yellow or brown urine, yellowing skin or eyes, unusual weakness or fatigue Pain, tingling, or numbness in the hands or feet, muscle weakness, change in vision, confusion or trouble speaking, loss of balance or coordination, trouble walking, seizures Rash, fever, and swollen lymph nodes Redness, blistering, peeling, or loosening of the skin, including inside the  mouth Sudden or severe stomach pain, bloody diarrhea, fever, nausea, vomiting Side effects that usually do not require medical attention (report to your care team if they continue or are bothersome): Bone, joint, or muscle pain Diarrhea Fatigue Loss of appetite Nausea Skin rash This list may not describe all possible side effects. Call your doctor for medical advice about side effects. You may report side effects to FDA at 1-800-FDA-1088. Where should I keep my medication? This medication is given in a hospital or clinic. It will not be stored at home. NOTE: This sheet is a summary. It may not cover all possible information. If you have questions about this medicine, talk to your doctor, pharmacist, or health care provider.  2024 Elsevier/Gold Standard (2022-01-24 00:00:00)  Pemetrexed Injection What is this medication? PEMETREXED (PEM e TREX ed) treats some types of cancer. It works by slowing down the growth of cancer cells. This medicine may be used for other purposes; ask your health care provider or pharmacist if you have questions. COMMON BRAND NAME(S): Alimta, PEMFEXY, PEMRYDI RTU What should I tell my care team before I take this medication? They need to know if you have any of these conditions: Infection, such as chickenpox, cold sores, or herpes Kidney disease Low blood cell levels (white cells, red cells, and platelets) Lung or breathing disease, such as asthma Radiation therapy An unusual or allergic reaction to pemetrexed, other medications, foods, dyes, or preservatives If you or your partner are pregnant or trying to get pregnant Breast-feeding How should I use this medication? This medication is injected into a vein. It is given by your care team in a hospital or clinic setting. Talk to your care team about the use of this medication in children. Special care may be needed. Overdosage: If you think you have taken too much of this medicine contact a poison control  [facility] or emergency room at once. NOTE: This medicine is only for you. Do not share this medicine with others. What if I miss a dose? Keep appointments for follow-up doses. It is important not to miss your dose. Call your care team if you are unable to keep an appointment. What may interact with this medication? Do not take this medication with any of the following: Live virus vaccines This medication may also interact with the following: Ibuprofen This list may not describe all possible interactions. Give your health care provider a list of all the medicines, herbs, non-prescription drugs, or dietary supplements you use. Also tell them if you smoke, drink alcohol, or use illegal drugs. Some items may interact with your medicine. What should I watch for while using this medication? Your condition will be monitored carefully while you are receiving this medication. This medication may make you feel generally unwell. This is not uncommon as chemotherapy can affect healthy cells as well as cancer cells. Report any side effects. Continue your course of treatment even though you feel ill unless your care team tells you to stop. This medication can cause serious side effects. To reduce the risk, your care team may give you  other medications to take before receiving this one. Be sure to follow the directions from your care team. This medication can cause a rash or redness in areas of the body that have previously had radiation therapy. If you have had radiation therapy, tell your care team if you notice a rash in this area. This medication may increase your risk of getting an infection. Call your care team for advice if you get a fever, chills, sore throat, or other symptoms of a cold or flu. Do not treat yourself. Try to avoid being around people who are sick. Be careful brushing or flossing your teeth or using a toothpick because you may get an infection or bleed more easily. If you have any dental work  done, tell your dentist you are receiving this medication. Avoid taking medications that contain aspirin, acetaminophen, ibuprofen, naproxen, or ketoprofen unless instructed by your care team. These medications may hide a fever. Check with your care team if you have severe diarrhea, nausea, and vomiting, or if you sweat a lot. The loss of too much body fluid may make it dangerous for you to take this medication. Talk to your care team if you or your partner wish to become pregnant or think either of you might be pregnant. This medication can cause serious birth defects if taken during pregnancy and for 6 months after the last dose. A negative pregnancy test is required before starting this medication. A reliable form of contraception is recommended while taking this medication and for 6 months after the last dose. Talk to your care team about reliable forms of contraception. Do not father a child while taking this medication and for 3 months after the last dose. Use a condom while having sex during this time period. Do not breastfeed while taking this medication and for 1 week after the last dose. This medication may cause infertility. Talk to your care team if you are concerned about your fertility. What side effects may I notice from receiving this medication? Side effects that you should report to your care team as soon as possible: Allergic reactions--skin rash, itching, hives, swelling of the face, lips, tongue, or throat Dry cough, shortness of breath or trouble breathing Infection--fever, chills, cough, sore throat, wounds that don't heal, pain or trouble when passing urine, general feeling of discomfort or being unwell Kidney injury--decrease in the amount of urine, swelling of the ankles, hands, or feet Low red blood cell level--unusual weakness or fatigue, dizziness, headache, trouble breathing Redness, blistering, peeling, or loosening of the skin, including inside the mouth Unusual bruising  or bleeding Side effects that usually do not require medical attention (report to your care team if they continue or are bothersome): Fatigue Loss of appetite Nausea Vomiting This list may not describe all possible side effects. Call your doctor for medical advice about side effects. You may report side effects to FDA at 1-800-FDA-1088. Where should I keep my medication? This medication is given in a hospital or clinic. It will not be stored at home. NOTE: This sheet is a summary. It may not cover all possible information. If you have questions about this medicine, talk to your doctor, pharmacist, or health care provider.  2024 Elsevier/Gold Standard (2022-01-17 00:00:00)  Carboplatin Injection What is this medication? CARBOPLATIN (KAR boe pla tin) treats some types of cancer. It works by slowing down the growth of cancer cells. This medicine may be used for other purposes; ask your health care provider or pharmacist if you have  questions. COMMON BRAND NAME(S): Paraplatin What should I tell my care team before I take this medication? They need to know if you have any of these conditions: Blood disorders Hearing problems Kidney disease Recent or ongoing radiation therapy An unusual or allergic reaction to carboplatin, cisplatin, other medications, foods, dyes, or preservatives Pregnant or trying to get pregnant Breast-feeding How should I use this medication? This medication is injected into a vein. It is given by your care team in a hospital or clinic setting. Talk to your care team about the use of this medication in children. Special care may be needed. Overdosage: If you think you have taken too much of this medicine contact a poison control [facility] or emergency room at once. NOTE: This medicine is only for you. Do not share this medicine with others. What if I miss a dose? Keep appointments for follow-up doses. It is important not to miss your dose. Call your care team if you are  unable to keep an appointment. What may interact with this medication? Medications for seizures Some antibiotics, such as amikacin, gentamicin, neomycin, streptomycin, tobramycin Vaccines This list may not describe all possible interactions. Give your health care provider a list of all the medicines, herbs, non-prescription drugs, or dietary supplements you use. Also tell them if you smoke, drink alcohol, or use illegal drugs. Some items may interact with your medicine. What should I watch for while using this medication? Your condition will be monitored carefully while you are receiving this medication. You may need blood work while taking this medication. This medication may make you feel generally unwell. This is not uncommon, as chemotherapy can affect healthy cells as well as cancer cells. Report any side effects. Continue your course of treatment even though you feel ill unless your care team tells you to stop. In some cases, you may be given additional medications to help with side effects. Follow all directions for their use. This medication may increase your risk of getting an infection. Call your care team for advice if you get a fever, chills, sore throat, or other symptoms of a cold or flu. Do not treat yourself. Try to avoid being around people who are sick. Avoid taking medications that contain aspirin, acetaminophen, ibuprofen, naproxen, or ketoprofen unless instructed by your care team. These medications may hide a fever. Be careful brushing or flossing your teeth or using a toothpick because you may get an infection or bleed more easily. If you have any dental work done, tell your dentist you are receiving this medication. Talk to your care team if you wish to become pregnant or think you might be pregnant. This medication can cause serious birth defects. Talk to your care team about effective forms of contraception. Do not breast-feed while taking this medication. What side effects  may I notice from receiving this medication? Side effects that you should report to your care team as soon as possible: Allergic reactions--skin rash, itching, hives, swelling of the face, lips, tongue, or throat Infection--fever, chills, cough, sore throat, wounds that don't heal, pain or trouble when passing urine, general feeling of discomfort or being unwell Low red blood cell level--unusual weakness or fatigue, dizziness, headache, trouble breathing Pain, tingling, or numbness in the hands or feet, muscle weakness, change in vision, confusion or trouble speaking, loss of balance or coordination, trouble walking, seizures Unusual bruising or bleeding Side effects that usually do not require medical attention (report to your care team if they continue or are bothersome): Hair  loss Nausea Unusual weakness or fatigue Vomiting This list may not describe all possible side effects. Call your doctor for medical advice about side effects. You may report side effects to FDA at 1-800-FDA-1088. Where should I keep my medication? This medication is given in a hospital or clinic. It will not be stored at home. NOTE: This sheet is a summary. It may not cover all possible information. If you have questions about this medicine, talk to your doctor, pharmacist, or health care provider.  2024 Elsevier/Gold Standard (2022-01-03 00:00:00)

## 2023-10-31 NOTE — Telephone Encounter (Signed)
 Called & spoke with pt's daughter, [facility] who states that pt is doing well & denies any problems/concerns.  She is eating & drinking fluids well & states she feels like she is 78 years old.  She knows her next appts & how to reach us  if needed.  Encouraged to call if needed.

## 2023-10-31 NOTE — Telephone Encounter (Signed)
-----   Message from Nurse Brigido Canales sent at 10/30/2023  4:40 PM EST ----- Regarding: Dr. Marguerita Shih 1st tx F/U call Dr. Marguerita Shih 1st TX f/u call Keytruda , Alimta, Carbo - tolerated well

## 2023-11-01 LAB — GUARDANT 360

## 2023-11-05 ENCOUNTER — Other Ambulatory Visit: Payer: Self-pay | Admitting: Medical Oncology

## 2023-11-05 DIAGNOSIS — E876 Hypokalemia: Secondary | ICD-10-CM

## 2023-11-05 DIAGNOSIS — C342 Malignant neoplasm of middle lobe, bronchus or lung: Secondary | ICD-10-CM

## 2023-11-06 ENCOUNTER — Inpatient Hospital Stay: Payer: Medicare Other

## 2023-11-06 DIAGNOSIS — Z5111 Encounter for antineoplastic chemotherapy: Secondary | ICD-10-CM | POA: Diagnosis not present

## 2023-11-06 DIAGNOSIS — C342 Malignant neoplasm of middle lobe, bronchus or lung: Secondary | ICD-10-CM

## 2023-11-06 DIAGNOSIS — E876 Hypokalemia: Secondary | ICD-10-CM

## 2023-11-06 LAB — CBC WITH DIFFERENTIAL (CANCER CENTER ONLY)
Abs Immature Granulocytes: 0.01 10*3/uL (ref 0.00–0.07)
Basophils Absolute: 0 10*3/uL (ref 0.0–0.1)
Basophils Relative: 1 %
Eosinophils Absolute: 0 10*3/uL (ref 0.0–0.5)
Eosinophils Relative: 1 %
HCT: 27.3 % — ABNORMAL LOW (ref 36.0–46.0)
Hemoglobin: 9 g/dL — ABNORMAL LOW (ref 12.0–15.0)
Immature Granulocytes: 1 %
Lymphocytes Relative: 50 %
Lymphs Abs: 1 10*3/uL (ref 0.7–4.0)
MCH: 26.5 pg (ref 26.0–34.0)
MCHC: 33 g/dL (ref 30.0–36.0)
MCV: 80.5 fL (ref 80.0–100.0)
Monocytes Absolute: 0.1 10*3/uL (ref 0.1–1.0)
Monocytes Relative: 4 %
Neutro Abs: 0.8 10*3/uL — ABNORMAL LOW (ref 1.7–7.7)
Neutrophils Relative %: 43 %
Platelet Count: 320 10*3/uL (ref 150–400)
RBC: 3.39 MIL/uL — ABNORMAL LOW (ref 3.87–5.11)
RDW: 15.1 % (ref 11.5–15.5)
WBC Count: 1.9 10*3/uL — ABNORMAL LOW (ref 4.0–10.5)
nRBC: 0 % (ref 0.0–0.2)

## 2023-11-06 LAB — CMP (CANCER CENTER ONLY)
ALT: 10 U/L (ref 0–44)
AST: 15 U/L (ref 15–41)
Albumin: 2.9 g/dL — ABNORMAL LOW (ref 3.5–5.0)
Alkaline Phosphatase: 102 U/L (ref 38–126)
Anion gap: 8 (ref 5–15)
BUN: 7 mg/dL — ABNORMAL LOW (ref 8–23)
CO2: 31 mmol/L (ref 22–32)
Calcium: 8.4 mg/dL — ABNORMAL LOW (ref 8.9–10.3)
Chloride: 95 mmol/L — ABNORMAL LOW (ref 98–111)
Creatinine: 0.6 mg/dL (ref 0.44–1.00)
GFR, Estimated: >60 mL/min (ref >60)
Glucose, Bld: 107 mg/dL — ABNORMAL HIGH (ref 70–99)
Potassium: 3.5 mmol/L (ref 3.5–5.1)
Sodium: 134 mmol/L — ABNORMAL LOW (ref 135–145)
Total Bilirubin: 0.4 mg/dL (ref 0.0–1.2)
Total Protein: 6.5 g/dL (ref 6.5–8.1)

## 2023-11-07 ENCOUNTER — Encounter: Payer: Self-pay | Admitting: Internal Medicine

## 2023-11-07 NOTE — Progress Notes (Signed)
Received voicemail from patient's daughter([name]) after receiving my card per my request.  Introduced myself as Dance movement psychotherapist and to offer available resources. Screened for one-time $1000 [employer] grant to assist with personal expenses while going through treatment. Patient does not qualify at this time due to being insured and not receiving any government assistance such as food stamps.  Asked about insurance ded/OOP amounts and she was not sure. Advised to reach out to insurance company directly with any insurance related questions. Patient has 2 insurance so advised they may not receive any bills and if so to make sure both insurances were filed. Advised there will be a number on the bill to contact with any questions.  They have my card for any additional financial questions or concerns.

## 2023-11-08 ENCOUNTER — Encounter: Payer: Self-pay | Admitting: Internal Medicine

## 2023-11-08 ENCOUNTER — Ambulatory Visit: Payer: Medicare Other | Admitting: Internal Medicine

## 2023-11-08 VITALS — BP 94/58 | HR 117 | Temp 97.9°F | Ht 66.0 in | Wt 155.6 lb

## 2023-11-08 DIAGNOSIS — C3491 Malignant neoplasm of unspecified part of right bronchus or lung: Secondary | ICD-10-CM

## 2023-11-08 DIAGNOSIS — J910 Malignant pleural effusion: Secondary | ICD-10-CM

## 2023-11-08 NOTE — Patient Instructions (Signed)
It was a pleasure to see you today! Come back and see me if the fluid returns or breathing worsens.   Please call [phone] if issues or concerns arise. You can also send [place] a message through MyChart, but but aware that this is not to be used for urgent issues and it may take up to 5-7 days to receive a reply. Please be aware that you will likely be able to view your results before I have a chance to respond to them. Please give [place] 5 business days to respond to any non-urgent results.   Dr. Arbutus Ped will keep an eye on things and will order imaging if your breathing is worse. If the fluid returns we can either do  Repeat thoracentesis - procedure to drain the fluid  Pleurodesis - procedure to stick the fluid to the chest wall  Indwelling pleural catheter - "pleurx" to drain the fluid at home.

## 2023-11-08 NOTE — Progress Notes (Signed)
[name]    [id number]    [birth year]  Primary Care Physician:Kaplan, Isidor Holts., PA-C Date of Appointment: 11/08/2023 Established Patient Visit  Chief complaint:   Chief Complaint  Patient presents with   Consult    Non small cell adenocarcinoma.  Started chemotherapy and immunotherapy on 10/30/2023.     HPI: Amanda Potter is a 78 y.o. woman with newly diagnosed adenocarcinoma of the lung. Had malignant pleural effusion while hospitalized and was seen by our team. Had therapeutic thoracentesis.    Interval Updates: Here for follow up. Has started cancer treatment with Dr. Arbutus Ped. Minimal coughing but feels like she's having trouble getting a deep breath in. Gets winded with minimal exertion. Lives with her son. Has good support. Here with her daughter.   I have reviewed the patient's family social and past medical history and updated as appropriate.   Past Medical History:  Diagnosis Date   Deviated nasal septum    Environmental allergies    GERD (gastroesophageal reflux disease)    Hyperlipidemia    Hypertension    Perforation of colon (HCC) 2006   Following colonoscopy   Sinusitis, acute     Past Surgical History:  Procedure Laterality Date   CHOLECYSTECTOMY     COLON SURGERY Right 2006   hemicolectomy   COLONOSCOPY WITH PROPOFOL N/A 05/30/2019   Procedure: COLONOSCOPY WITH PROPOFOL;  Surgeon: Jeani Hawking, MD;  Location: [facility];  Service: Endoscopy;  Laterality: N/A;   ENTEROSCOPY N/A 05/30/2019   Procedure: ENTEROSCOPY;  Surgeon: Jeani Hawking, MD;  Location: [facility];  Service: Endoscopy;  Laterality: N/A;   HERNIA REPAIR  [id number]   INCISIONAL HERNIA REPAIR N/A 03/03/2013   Procedure: LAPAROSCOPIC INCISIONAL HERNIA REPAIR;  Surgeon: Adolph Pollack, MD;  Location: [facility];  Service: General;  Laterality: N/A;  LYSIS OF ADHESIONS FOR 60 MINUTES   INSERTION OF MESH N/A 03/03/2013   Procedure: INSERTION OF MESH;  Surgeon: Adolph Pollack,  MD;  Location: [facility];  Service: General;  Laterality: N/A;   POLYPECTOMY  05/30/2019   Procedure: POLYPECTOMY;  Surgeon: Jeani Hawking, MD;  Location: [facility];  Service: Endoscopy;;   TONSILLECTOMY     TUBAL LIGATION      Family History  Problem Relation Age of Onset   Hearing loss Mother    Diabetes Mother    Hyperlipidemia Mother    Hypertension Mother    Heart disease Father     Social History   Occupational History   Not on file  Tobacco Use   Smoking status: Never   Smokeless tobacco: Never  Vaping Use   Vaping status: Never Used  Substance and Sexual Activity   Alcohol use: No   Drug use: No   Sexual activity: Not on file     Physical Exam: Blood pressure (!) 94/58, pulse (!) 117, temperature 97.9 F (36.6 C), temperature source Oral, height 5\' 6"  (1.676 m), weight 155 lb 9.6 oz (70.6 kg), SpO2 96%.  Gen:      No acute distress, chronically ill in wheelchair Lungs:    No increased respiratory effort, symmetric chest wall excursion, breath sounds slightly diminished right lung base CV:         Regular rate and rhythm; no murmurs, rubs, or gallops.  No pedal edema   Data Reviewed: Imaging: I have personally reviewed the pet scan shows minimal right pleural effusion, associated atelectasis, component of hydropneumothorax.   Labs: Lab Results  Component Value Date   NA 134 (L) 11/06/2023   K 3.5 11/06/2023   CO2 31 11/06/2023   GLUCOSE 107 (H) 11/06/2023   BUN 7 (L) 11/06/2023   CREATININE 0.60 11/06/2023   CALCIUM 8.4 (L) 11/06/2023   GFRNONAA >60 11/06/2023   Lab Results  Component Value Date   WBC 1.9 (L) 11/06/2023   HGB 9.0 (L) 11/06/2023   HCT 27.3 (L) 11/06/2023   MCV 80.5 11/06/2023   PLT 320 11/06/2023    Immunization status: Immunization History  Administered Date(s) Administered   PFIZER Comirnaty(Gray Top)Covid-19 Tri-Sucrose Vaccine 04/19/2021   PFIZER(Purple Top)SARS-COV-2 Vaccination 11/06/2019   Pfizer(Comirnaty)Fall  Seasonal Vaccine 12 years and older 06/21/2023   Unspecified SARS-COV-2 Vaccination 10/16/2019, 07/26/2020    External Records Personally Reviewed: hospital stay.   Assessment:  MPE Adenocarcinoma lung  Plan/Recommendations:  Dr. Arbutus Ped will keep an eye on things and will order imaging if your breathing is worse. If the fluid returns we can either do  Repeat thoracentesis - procedure to drain the fluid  Pleurodesis - procedure to stick the fluid to the chest wall  Indwelling pleural catheter - "pleurx" to drain the fluid at home.   We discussed the risks and benefits of all these options for her in detail. Currently no substantial effusion for intervention. She will call me if this recurs.   I spent 30 minutes on 11/08/2023 in care of this patient including face to face time and non-face to face time spent charting, review of outside records, and coordination of care.    Return to Care: Return if symptoms worsen or fail to improve.   Durel Salts, MD Pulmonary and Critical Care Medicine [facility] Office:[phone]

## 2023-11-11 ENCOUNTER — Other Ambulatory Visit: Payer: Self-pay

## 2023-11-11 ENCOUNTER — Emergency Department (HOSPITAL_COMMUNITY): Payer: Medicare Other

## 2023-11-11 ENCOUNTER — Encounter (HOSPITAL_COMMUNITY): Payer: Self-pay

## 2023-11-11 ENCOUNTER — Inpatient Hospital Stay (HOSPITAL_COMMUNITY)
Admission: EM | Admit: 2023-11-11 | Discharge: 2023-11-28 | DRG: 808 | Disposition: A | Discharge status: Home / Self Care | Payer: Medicare Other | Attending: Internal Medicine | Admitting: Internal Medicine

## 2023-11-11 ENCOUNTER — Encounter: Payer: Self-pay | Admitting: Internal Medicine

## 2023-11-11 DIAGNOSIS — Z8249 Family history of ischemic heart disease and other diseases of the circulatory system: Secondary | ICD-10-CM | POA: Diagnosis not present

## 2023-11-11 DIAGNOSIS — R5081 Fever presenting with conditions classified elsewhere: Secondary | ICD-10-CM | POA: Diagnosis present

## 2023-11-11 DIAGNOSIS — E861 Hypovolemia: Secondary | ICD-10-CM | POA: Diagnosis present

## 2023-11-11 DIAGNOSIS — J910 Malignant pleural effusion: Secondary | ICD-10-CM | POA: Diagnosis present

## 2023-11-11 DIAGNOSIS — E0590 Thyrotoxicosis, unspecified without thyrotoxic crisis or storm: Secondary | ICD-10-CM | POA: Insufficient documentation

## 2023-11-11 DIAGNOSIS — J019 Acute sinusitis, unspecified: Secondary | ICD-10-CM | POA: Diagnosis present

## 2023-11-11 DIAGNOSIS — D509 Iron deficiency anemia, unspecified: Secondary | ICD-10-CM | POA: Diagnosis present

## 2023-11-11 DIAGNOSIS — J189 Pneumonia, unspecified organism: Secondary | ICD-10-CM | POA: Diagnosis present

## 2023-11-11 DIAGNOSIS — C3431 Malignant neoplasm of lower lobe, right bronchus or lung: Secondary | ICD-10-CM | POA: Diagnosis present

## 2023-11-11 DIAGNOSIS — Z91040 Latex allergy status: Secondary | ICD-10-CM

## 2023-11-11 DIAGNOSIS — C342 Malignant neoplasm of middle lobe, bronchus or lung: Secondary | ICD-10-CM

## 2023-11-11 DIAGNOSIS — I959 Hypotension, unspecified: Secondary | ICD-10-CM | POA: Diagnosis present

## 2023-11-11 DIAGNOSIS — C3490 Malignant neoplasm of unspecified part of unspecified bronchus or lung: Secondary | ICD-10-CM | POA: Diagnosis not present

## 2023-11-11 DIAGNOSIS — E876 Hypokalemia: Secondary | ICD-10-CM | POA: Diagnosis present

## 2023-11-11 DIAGNOSIS — D638 Anemia in other chronic diseases classified elsewhere: Secondary | ICD-10-CM | POA: Diagnosis present

## 2023-11-11 DIAGNOSIS — Z7982 Long term (current) use of aspirin: Secondary | ICD-10-CM

## 2023-11-11 DIAGNOSIS — Z1152 Encounter for screening for COVID-19: Secondary | ICD-10-CM

## 2023-11-11 DIAGNOSIS — D709 Neutropenia, unspecified: Secondary | ICD-10-CM | POA: Diagnosis not present

## 2023-11-11 DIAGNOSIS — K219 Gastro-esophageal reflux disease without esophagitis: Secondary | ICD-10-CM | POA: Diagnosis present

## 2023-11-11 DIAGNOSIS — C3491 Malignant neoplasm of unspecified part of right bronchus or lung: Secondary | ICD-10-CM | POA: Diagnosis not present

## 2023-11-11 DIAGNOSIS — E871 Hypo-osmolality and hyponatremia: Secondary | ICD-10-CM | POA: Diagnosis present

## 2023-11-11 DIAGNOSIS — Z79899 Other long term (current) drug therapy: Secondary | ICD-10-CM

## 2023-11-11 DIAGNOSIS — R509 Fever, unspecified: Principal | ICD-10-CM

## 2023-11-11 DIAGNOSIS — E785 Hyperlipidemia, unspecified: Secondary | ICD-10-CM | POA: Diagnosis present

## 2023-11-11 DIAGNOSIS — R531 Weakness: Secondary | ICD-10-CM | POA: Diagnosis present

## 2023-11-11 DIAGNOSIS — Z833 Family history of diabetes mellitus: Secondary | ICD-10-CM

## 2023-11-11 LAB — URINALYSIS, W/ REFLEX TO CULTURE (INFECTION SUSPECTED)
Bilirubin Urine: NEGATIVE
Glucose, UA: NEGATIVE mg/dL
Hgb urine dipstick: NEGATIVE
Ketones, ur: NEGATIVE mg/dL
Nitrite: NEGATIVE
Protein, ur: NEGATIVE mg/dL
Specific Gravity, Urine: 1.004 — ABNORMAL LOW (ref 1.005–1.030)
pH: 6 (ref 5.0–8.0)

## 2023-11-11 LAB — COMPREHENSIVE METABOLIC PANEL
ALT: 10 U/L (ref 0–44)
AST: 14 U/L — ABNORMAL LOW (ref 15–41)
Albumin: 1.8 g/dL — ABNORMAL LOW (ref 3.5–5.0)
Alkaline Phosphatase: 87 U/L (ref 38–126)
Anion gap: 10 (ref 5–15)
BUN: 5 mg/dL — ABNORMAL LOW (ref 8–23)
CO2: 26 mmol/L (ref 22–32)
Calcium: 7.8 mg/dL — ABNORMAL LOW (ref 8.9–10.3)
Chloride: 94 mmol/L — ABNORMAL LOW (ref 98–111)
Creatinine, Ser: 0.76 mg/dL (ref 0.44–1.00)
GFR, Estimated: >60 mL/min (ref >60)
Glucose, Bld: 116 mg/dL — ABNORMAL HIGH (ref 70–99)
Potassium: 2.9 mmol/L — ABNORMAL LOW (ref 3.5–5.1)
Sodium: 130 mmol/L — ABNORMAL LOW (ref 135–145)
Total Bilirubin: 0.5 mg/dL (ref 0.0–1.2)
Total Protein: 5.7 g/dL — ABNORMAL LOW (ref 6.5–8.1)

## 2023-11-11 LAB — RESP PANEL BY RT-PCR (RSV, FLU A&B, COVID)  RVPGX2
Influenza A by PCR: NEGATIVE
Influenza B by PCR: NEGATIVE
Resp Syncytial Virus by PCR: NEGATIVE
SARS Coronavirus 2 by RT PCR: NEGATIVE

## 2023-11-11 LAB — CBC WITH DIFFERENTIAL/PLATELET
Abs Immature Granulocytes: 0 10*3/uL (ref 0.00–0.07)
Basophils Absolute: 0 10*3/uL (ref 0.0–0.1)
Basophils Relative: 1 %
Eosinophils Absolute: 0 10*3/uL (ref 0.0–0.5)
Eosinophils Relative: 0 %
HCT: 25.7 % — ABNORMAL LOW (ref 36.0–46.0)
Hemoglobin: 8.3 g/dL — ABNORMAL LOW (ref 12.0–15.0)
Lymphocytes Relative: 47 %
Lymphs Abs: 1 10*3/uL (ref 0.7–4.0)
MCH: 26.1 pg (ref 26.0–34.0)
MCHC: 32.3 g/dL (ref 30.0–36.0)
MCV: 80.8 fL (ref 80.0–100.0)
Monocytes Absolute: 0.2 10*3/uL (ref 0.1–1.0)
Monocytes Relative: 9 %
Neutro Abs: 0.9 10*3/uL — ABNORMAL LOW (ref 1.7–7.7)
Neutrophils Relative %: 43 %
Platelets: 236 10*3/uL (ref 150–400)
RBC: 3.18 MIL/uL — ABNORMAL LOW (ref 3.87–5.11)
RDW: 15.2 % (ref 11.5–15.5)
WBC: 2.2 10*3/uL — ABNORMAL LOW (ref 4.0–10.5)
nRBC: 0 % (ref 0.0–0.2)
nRBC: 0 /100{WBCs}

## 2023-11-11 LAB — I-STAT CG4 LACTIC ACID, ED: Lactic Acid, Venous: 1.5 mmol/L (ref 0.5–1.9)

## 2023-11-11 LAB — PROTIME-INR
INR: 1.2 (ref 0.8–1.2)
Prothrombin Time: 14.9 s (ref 11.4–15.2)

## 2023-11-11 LAB — APTT: aPTT: 33 s (ref 24–36)

## 2023-11-11 LAB — MAGNESIUM: Magnesium: 1.3 mg/dL — ABNORMAL LOW (ref 1.7–2.4)

## 2023-11-11 MED ORDER — ACETAMINOPHEN 325 MG PO TABS
650.0000 mg | ORAL_TABLET | Freq: Once | ORAL | Status: AC
  Administered 2023-11-11: 650 mg via ORAL
  Filled 2023-11-11: qty 2

## 2023-11-11 MED ORDER — SENNOSIDES-DOCUSATE SODIUM 8.6-50 MG PO TABS: 1.0000 | ORAL_TABLET | Freq: Every evening | ORAL | Status: DC | PRN

## 2023-11-11 MED ORDER — METRONIDAZOLE 500 MG/100ML IV SOLN
500.0000 mg | Freq: Once | INTRAVENOUS | Status: AC
  Administered 2023-11-11: 500 mg via INTRAVENOUS
  Filled 2023-11-11: qty 100

## 2023-11-11 MED ORDER — ENOXAPARIN SODIUM 40 MG/0.4ML IJ SOSY
40.0000 mg | PREFILLED_SYRINGE | INTRAMUSCULAR | Status: DC
  Administered 2023-11-11 – 2023-11-27 (×17): 40 mg via SUBCUTANEOUS
  Filled 2023-11-11 (×17): qty 0.4

## 2023-11-11 MED ORDER — ALBUTEROL SULFATE (2.5 MG/3ML) 0.083% IN NEBU: 3.0000 mL | INHALATION_SOLUTION | Freq: Four times a day (QID) | RESPIRATORY_TRACT | Status: DC | PRN

## 2023-11-11 MED ORDER — ACETAMINOPHEN 650 MG RE SUPP: 650.0000 mg | Freq: Four times a day (QID) | RECTAL | Status: DC | PRN

## 2023-11-11 MED ORDER — POTASSIUM CHLORIDE CRYS ER 20 MEQ PO TBCR
40.0000 meq | EXTENDED_RELEASE_TABLET | Freq: Once | ORAL | Status: AC
  Administered 2023-11-11: 40 meq via ORAL
  Filled 2023-11-11: qty 2

## 2023-11-11 MED ORDER — ACETAMINOPHEN 325 MG PO TABS
650.0000 mg | ORAL_TABLET | Freq: Four times a day (QID) | ORAL | Status: DC | PRN
  Administered 2023-11-16 – 2023-11-27 (×18): 650 mg via ORAL
  Filled 2023-11-11 (×20): qty 2

## 2023-11-11 MED ORDER — LACTATED RINGERS IV SOLN: INTRAVENOUS | Status: DC

## 2023-11-11 MED ORDER — SIMVASTATIN 20 MG PO TABS
20.0000 mg | ORAL_TABLET | Freq: Every day | ORAL | Status: DC
  Administered 2023-11-11 – 2023-11-27 (×17): 20 mg via ORAL
  Filled 2023-11-11 (×18): qty 1

## 2023-11-11 MED ORDER — METHIMAZOLE 10 MG PO TABS
20.0000 mg | ORAL_TABLET | Freq: Every day | ORAL | Status: DC
  Administered 2023-11-12 – 2023-11-28 (×17): 20 mg via ORAL
  Filled 2023-11-11 (×17): qty 2
  Filled 2023-11-11: qty 1

## 2023-11-11 MED ORDER — LACTATED RINGERS IV BOLUS (SEPSIS)
1000.0000 mL | Freq: Once | INTRAVENOUS | Status: AC
  Administered 2023-11-11: 1000 mL via INTRAVENOUS

## 2023-11-11 MED ORDER — PANTOPRAZOLE SODIUM 40 MG PO TBEC
40.0000 mg | DELAYED_RELEASE_TABLET | Freq: Every day | ORAL | Status: DC
  Administered 2023-11-12 – 2023-11-28 (×17): 40 mg via ORAL
  Filled 2023-11-11 (×17): qty 1

## 2023-11-11 MED ORDER — LACTATED RINGERS IV BOLUS (SEPSIS)
250.0000 mL | Freq: Once | INTRAVENOUS | Status: AC
  Administered 2023-11-11: 250 mL via INTRAVENOUS

## 2023-11-11 MED ORDER — POTASSIUM CHLORIDE 20 MEQ PO PACK: 40.0000 meq | PACK | Freq: Once | ORAL | Status: DC

## 2023-11-11 MED ORDER — ONDANSETRON HCL 4 MG/2ML IJ SOLN
4.0000 mg | Freq: Once | INTRAMUSCULAR | Status: AC
  Administered 2023-11-11: 4 mg via INTRAVENOUS
  Filled 2023-11-11: qty 2

## 2023-11-11 MED ORDER — SODIUM CHLORIDE 0.9 % IV SOLN
2.0000 g | Freq: Two times a day (BID) | INTRAVENOUS | Status: DC
  Administered 2023-11-12 – 2023-11-13 (×3): 2 g via INTRAVENOUS
  Filled 2023-11-11 (×3): qty 12.5

## 2023-11-11 MED ORDER — SODIUM CHLORIDE 0.9 % IV SOLN
2.0000 g | Freq: Once | INTRAVENOUS | Status: AC
  Administered 2023-11-11: 2 g via INTRAVENOUS
  Filled 2023-11-11: qty 12.5

## 2023-11-11 MED ORDER — ONDANSETRON HCL 4 MG PO TABS: 4.0000 mg | ORAL_TABLET | Freq: Four times a day (QID) | ORAL | Status: DC | PRN

## 2023-11-11 MED ORDER — VANCOMYCIN HCL IN DEXTROSE 1-5 GM/200ML-% IV SOLN
1000.0000 mg | Freq: Once | INTRAVENOUS | Status: AC
  Administered 2023-11-11: 1000 mg via INTRAVENOUS
  Filled 2023-11-11: qty 200

## 2023-11-11 MED ORDER — ONDANSETRON HCL 4 MG/2ML IJ SOLN
4.0000 mg | Freq: Four times a day (QID) | INTRAMUSCULAR | Status: DC | PRN
  Administered 2023-11-18 – 2023-11-26 (×2): 4 mg via INTRAVENOUS
  Filled 2023-11-11 (×2): qty 2

## 2023-11-11 MED ORDER — MAGNESIUM SULFATE 2 GM/50ML IV SOLN
2.0000 g | Freq: Once | INTRAVENOUS | Status: AC
  Administered 2023-11-11: 2 g via INTRAVENOUS
  Filled 2023-11-11: qty 50

## 2023-11-11 NOTE — ED Provider Triage Note (Signed)
 Emergency Medicine Provider Triage Evaluation Note  Amanda Potter , a 78 y.o. female  was evaluated in triage.  Pt complains of fever   Review of Systems  Positive: Nausea, fever, fatigue   Negative:   Physical Exam  BP (!) 98/54 (BP Location: Right Arm)   Pulse (!) 117   Temp 99.9 F (37.7 C) (Oral)   Resp 16   Ht 5\' 6"  (1.676 m)   Wt 70.6 kg   SpO2 96%   BMI 25.12 kg/m  Gen:   Awake, no distress   Resp:  Normal effort  MSK:   Moves extremities without difficulty  Other:    Medical Decision Making  Medically screening exam initiated at 3:42 PM.  Appropriate orders placed.  Amanda Potter was informed that the remainder of the evaluation will be completed by another provider, this initial triage assessment does not replace that evaluation, and the importance of remaining in the ED until their evaluation is complete.  Sepsis orders placed    Elson Areas, [place] 11/11/23 6045

## 2023-11-11 NOTE — ED Triage Notes (Signed)
 Pt had her first chemo treatment on 2/4, pt developed fever today of 101. Pt c.o feeling clammy, intermittent nausea, fatigue.

## 2023-11-11 NOTE — H&P (Signed)
 History and Physical    Amanda Potter UJW:[id number] DOB: November 22, 1945 DOA: 11/11/2023  PCP: Richmond Campbell., PA-C  Patient coming from: Home  I have personally briefly reviewed patient's old medical records in [facility]  Chief Complaint: Fever, nausea  HPI: Amanda Potter is a 78 y.o. 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  Patient states that she has been feeling generally fatigued for months.  Today she was significantly more fatigued/lethargic than usual.  She had developed nausea and dry heaves but did not throw up.  She has had some intermittent cough and chest congestion which she attributes to seasonal allergies and postnasal drip.  She has not had any sputum production with cough.  She denies any shortness of breath or chest pain.  When her daughter saw her after church today, patient appeared feverish and very warm to touch.  They took her temperature at home which was 101.4 F.  Patient later developed chills and felt clammy.  They brought her to the ED for further evaluation.    Patient notes that 2 days ago she developed a rash on her back which appeared as vertical linear erythematous markings which has since started fading.  ED Course  Labs/Imaging on admission: I have personally reviewed following labs and imaging studies.  Initial vitals showed BP 98/54, pulse 117, RR 16, temp 99.9 F, SpO2 96% on room air.  Labs show WBC 2.2, ANC 900, hemoglobin 8.3, platelets 236,000, sodium 130, potassium 3.9, bicarb 26, BUN 5, creatinine 0.76, serum glucose 116, albumin 1.8, corrected calcium 9.6, lactic acid 1.5.  SARS-CoV-2, influenza, RSV PCR negative.  Blood cultures in process.  UA negative nitrites, trace leukocytes, 0-5 RBCs and WBCs, rare  bacteria.  Portable chest x-ray showed redemonstration of heterogeneous opacity overlying right lower lateral hemithorax which corresponds to loculated hydropneumothorax on prior chest CT.  Diffuse homogenous opacification of right hemithorax in comparison to the left which may be due to a layering pleural effusion.  Patient was given 2.25 L LR, oral K 40 mEq, IV vancomycin, cefepime, and Flagyl.  The hospitalist service was consulted to admit.  Review of Systems: All systems reviewed and are negative except as documented in history of present illness above.   Past Medical History:  Diagnosis Date   Deviated nasal septum    Environmental allergies    GERD (gastroesophageal reflux disease)    Hyperlipidemia    Hypertension    Perforation of colon (HCC) 2006   Following colonoscopy   Sinusitis, acute     Past Surgical History:  Procedure Laterality Date   CHOLECYSTECTOMY     COLON SURGERY Right 2006   hemicolectomy   COLONOSCOPY WITH PROPOFOL N/A 05/30/2019   Procedure: COLONOSCOPY WITH PROPOFOL;  Surgeon: Jeani Hawking, MD;  Location: [facility];  Service: Endoscopy;  Laterality: N/A;   ENTEROSCOPY N/A 05/30/2019   Procedure: ENTEROSCOPY;  Surgeon: Jeani Hawking, MD;  Location: [facility];  Service: Endoscopy;  Laterality: N/A;   HERNIA REPAIR  [id number]   INCISIONAL HERNIA REPAIR N/A 03/03/2013   Procedure: LAPAROSCOPIC INCISIONAL HERNIA REPAIR;  Surgeon: Adolph Pollack, MD;  Location: [facility];  Service: General;  Laterality: N/A;  LYSIS OF ADHESIONS FOR 60 MINUTES   INSERTION OF MESH N/A 03/03/2013   Procedure: INSERTION OF MESH;  Surgeon: Adolph Pollack, MD;  Location: [facility];  Service: General;  Laterality: N/A;   POLYPECTOMY  05/30/2019   Procedure: POLYPECTOMY;  Surgeon: Jeani Hawking, MD;  Location: [facility];  Service: Endoscopy;;   TONSILLECTOMY     TUBAL LIGATION      Social History:  reports that she has never smoked. She has never used smokeless tobacco. She  reports that she does not drink alcohol and does not use drugs.  Allergies  Allergen Reactions   Latex Rash   Tape Rash    Glue on tape    Family History  Problem Relation Age of Onset   Hearing loss Mother    Diabetes Mother    Hyperlipidemia Mother    Hypertension Mother    Heart disease Father      Prior to Admission medications   Medication Sig Start Date End Date Taking? Authorizing Provider  albuterol (VENTOLIN HFA) 108 (90 Base) MCG/ACT inhaler Inhale 1 puff into the lungs every 6 (six) hours as needed for shortness of breath. 09/12/23 03/10/24  [provider]  aspirin 81 MG tablet Take 81 mg by mouth daily at 12 noon.     [provider]  Cholecalciferol 1.25 MG (50000 UT) capsule Take 50,000 Units by mouth daily. 04/16/22   [provider]  diphenhydrAMINE (BENADRYL) 25 MG tablet Take 25 mg by mouth daily as needed for allergies.    [provider]  Ferrous Sulfate (SLOW FE PO) Take 1 tablet by mouth daily.    [provider]  folic acid (FOLVITE) 1 MG tablet Take 1 tablet (1 mg total) by mouth daily. 10/23/23   Heilingoetter, Cassandra L, PA-C  lidocaine-prilocaine (EMLA) cream Apply 1 Application topically as needed. 10/23/23   Heilingoetter, Cassandra L, PA-C  methimazole (TAPAZOLE) 10 MG tablet Take 2 tablets (20 mg total) by mouth daily. 10/04/23 01/02/24  [place], Alvira Philips, DO  naproxen sodium (ANAPROX) 220 MG tablet Take 220 mg by mouth 2 (two) times daily as needed (for pain).    [provider]  omeprazole (PRILOSEC) 20 MG capsule Take 20 mg by mouth daily.    [provider]  Polyvinyl Alcohol-Povidone (REFRESH OP) Place 1 drop into both eyes daily as needed (dry eyes).    [provider]  potassium chloride SA (KLOR-CON M) 20 MEQ tablet Take 1 tablet (20 mEq total) by mouth 2 (two) times daily. 10/30/23   Heilingoetter, Cassandra L, PA-C  prochlorperazine (COMPAZINE) 10 MG tablet Take 1 tablet (10 mg  total) by mouth every 6 (six) hours as needed. 10/23/23   Heilingoetter, Cassandra L, PA-C  simvastatin (ZOCOR) 20 MG tablet Take 20 mg by mouth at bedtime.     [provider]  Vitamin D, Ergocalciferol, (DRISDOL) 1.25 MG (50000 UNIT) CAPS capsule Take 50,000 Units by mouth once a week. 10/11/23   [provider]    Physical Exam: Vitals:   11/11/23 1745 11/11/23 1756 11/11/23 1930 11/11/23 2015  BP: 112/64  113/72 105/65  Pulse: 87  94 83  Resp: 19  (!) 23 (!) 25  Temp:  98.4 F (36.9 C)    TempSrc:  Oral    SpO2: 93%  100% 98%  Weight:      Height:       Constitutional: Resting in bed, NAD, calm, comfortable Eyes: EOMI, lids and conjunctivae normal ENMT: Mucous membranes are moist. Posterior pharynx clear of any exudate or lesions.Normal dentition.  Neck: normal, supple, no masses. Respiratory: Slightly diminished breath sounds right lower  lung field otherwise clear to auscultation. Normal respiratory effort. No accessory muscle use.  Cardiovascular: Regular rate and rhythm, no murmurs / rubs / gallops. No extremity edema. 2+ pedal pulses. Abdomen: no tenderness, no masses palpated.  Musculoskeletal: no clubbing / cyanosis. No joint deformity upper and lower extremities. Good ROM, no contractures. Normal muscle tone.  Skin: Multiple linear light brown macules and vertical orientation on the back.  No erythema or superimposed cellulitis Neurologic: Sensation intact. Strength 5/5 in all 4.  Psychiatric: Normal judgment and insight. Alert and oriented x 3. Normal mood.   EKG: Personally reviewed. Sinus rhythm, rate 96, no acute ischemic changes.  Assessment/Plan Principal Problem:   Neutropenic fever (HCC) Active Problems:   Stage IV adenocarcinoma of lung, right (HCC)   Malignant pleural effusion   Hypokalemia   Amanda Potter is a 78 y.o. female with medical history significant for recently diagnosed stage IV lung adenocarcinoma with malignant right pleural  effusion (s/p first treatment Keytruda, Alimta, carboplatin on 10/30/2023), hyperthyroidism, HLD, GERD who is admitted with neutropenic fever.  Assessment and Plan: Neutropenic fever: WBC 2.2 with ANC 900 and send no recent start of chemotherapy/immunotherapy.  Patient reported fever of 101 F at home.  CXR shows persistent findings of right lower lobe loculated hydropneumothorax and layering pleural effusion corresponding with her lung cancer.  Superimposed infection possible.  COVID, flu, RSV negative. -Follow blood cultures -Continue IV cefepime  Hypotension: Hypotensive on arrival to the ED, BP stabilized with IV fluid resuscitation.  She has been off antihypertensives since last admission.  Continue gentle IV fluid hydration overnight.  Stage IV lung adenocarcinoma with malignant right pleural effusion: Follows with oncology Dr. Arbutus Ped.  She is s/p first cycle treatment with Keytruda, Alimta, carboplatin on 10/30/2023.  Hypokalemia/hypomagnesemia: Repleting recheck labs in AM..  Hyponatremia: Mild, likely due to mild hypovolemia.  On IV fluids, recheck labs in AM.  Normocytic anemia: Hemoglobin 8.3, potentially related to her cancer treatment.  No obvious bleeding.  Hyperthyroidism: Continue methimazole.  Hyperlipidemia: Continue simvastatin.  GERD: Continue PPI.   DVT prophylaxis: enoxaparin (LOVENOX) injection 40 mg Start: 11/11/23 2200 Code Status: Full code, confirmed with patient on admission Family Communication: Daughter at bedside Disposition Plan: From home and likely discharge to home pending clinical progress Consults called: None Severity of Illness: The appropriate patient status for this patient is INPATIENT. Inpatient status is judged to be reasonable and necessary in order to provide the required intensity of service to ensure the patient's safety. The patient's presenting symptoms, physical exam findings, and initial radiographic and laboratory data in the  context of their chronic comorbidities is felt to place them at high risk for further clinical deterioration. Furthermore, it is not anticipated that the patient will be medically stable for discharge from the hospital within 2 midnights of admission.   * I certify that at the point of admission it is my clinical judgment that the patient will require inpatient hospital care spanning beyond 2 midnights from the point of admission due to high intensity of service, high risk for further deterioration and high frequency of surveillance required.Darreld Mclean MD [facility]  If 7PM-7AM, please contact night-coverage [email or web address]  11/11/2023, 9:32 PM

## 2023-11-11 NOTE — ED Provider Notes (Signed)
 [place] EMERGENCY DEPARTMENT AT [facility] Provider Note   CSN: [id number] Arrival date & time: 11/11/23  1519     History  Chief Complaint  Patient presents with   Fever    Amanda Potter is a 78 y.o. female.  78 year old female with recently diagnosed lung cancer presenting to the emergency department today with concern for fever and generalized weakness.  The patient been feeling unwell now over the past few days.  She found to be febrile at home.  The patient has had some nausea as well as some diaphoresis and chills today.  She states that she has been having generalized weakness to the point that she has been having difficulty taking care of her self at home.  She came to the ER today for further evaluation.  The patient recently started immunotherapy/chemotherapy on the fourth and has had 1 round so far.  She denies any abdominal pain.  Denies any cough.   Fever      Home Medications Prior to Admission medications   Medication Sig Start Date End Date Taking? Authorizing Provider  albuterol (VENTOLIN HFA) 108 (90 Base) MCG/ACT inhaler Inhale 1 puff into the lungs every 6 (six) hours as needed for shortness of breath. 09/12/23 03/10/24 Yes [provider]  aspirin 81 MG tablet Take 81 mg by mouth daily at 12 noon.    Yes [provider]  diphenhydrAMINE (BENADRYL) 25 MG tablet Take 25 mg by mouth daily as needed for allergies.   Yes [provider]  Ferrous Sulfate (SLOW FE PO) Take 1 tablet by mouth daily.   Yes [provider]  folic acid (FOLVITE) 1 MG tablet Take 1 tablet (1 mg total) by mouth daily. 10/23/23  Yes Heilingoetter, Cassandra L, PA-C  methimazole (TAPAZOLE) 10 MG tablet Take 2 tablets (20 mg total) by mouth daily. 10/04/23 01/02/24 Yes [place], Eric J, DO  omeprazole (PRILOSEC) 20 MG capsule Take 20 mg by mouth daily.   Yes [provider]  prochlorperazine (COMPAZINE) 10 MG tablet Take 1 tablet (10 mg  total) by mouth every 6 (six) hours as needed. Patient taking differently: Take 10 mg by mouth every 6 (six) hours as needed for nausea. 10/23/23  Yes Heilingoetter, Cassandra L, PA-C  simvastatin (ZOCOR) 20 MG tablet Take 20 mg by mouth at bedtime.    Yes [provider]  Vitamin D, Ergocalciferol, (DRISDOL) 1.25 MG (50000 UNIT) CAPS capsule Take 50,000 Units by mouth once a week. 10/11/23  Yes [provider]  lidocaine-prilocaine (EMLA) cream Apply 1 Application topically as needed. Patient not taking: Reported on 11/11/2023 10/23/23   Heilingoetter, Cassandra L, PA-C  potassium chloride SA (KLOR-CON M) 20 MEQ tablet Take 1 tablet (20 mEq total) by mouth 2 (two) times daily. Patient not taking: Reported on 11/11/2023 10/30/23   Heilingoetter, Cassandra L, PA-C      Allergies    Latex and Tape    Review of Systems   Review of Systems  Constitutional:  Positive for fatigue and fever.  All other systems reviewed and are negative.   Physical Exam Updated Vital Signs BP 105/65   Pulse 83   Temp 98 F (36.7 C) (Oral)   Resp (!) 25   Ht 5\' 6"  (1.676 m)   Wt 70.6 kg   SpO2 98%   BMI 25.12 kg/m  Physical Exam Vitals and nursing note reviewed.   Gen: Ill-appearing Eyes: PERRL, EOMI HEENT: no oropharyngeal swelling Neck: trachea midline no meningismus  Resp: clear to auscultation bilaterally Card: Tachycardic, no murmurs, rubs, or gallops Abd: nontender, nondistended Extremities: no calf tenderness, no edema Vascular: 2+ radial pulses bilaterally, 2+ DP pulses bilaterally Neuro: No focal deficits Skin: no rashes Psyc: acting appropriately   ED Results / Procedures / Treatments   Labs (all labs ordered are listed, but only abnormal results are displayed) Labs Reviewed  COMPREHENSIVE METABOLIC PANEL - Abnormal; Notable for the following components:      Result Value   Sodium 130 (*)    Potassium 2.9 (*)    Chloride 94 (*)    Glucose, Bld 116 (*)    BUN 5 (*)     Calcium 7.8 (*)    Total Protein 5.7 (*)    Albumin 1.8 (*)    AST 14 (*)    All other components within normal limits  CBC WITH DIFFERENTIAL/PLATELET - Abnormal; Notable for the following components:   WBC 2.2 (*)    RBC 3.18 (*)    Hemoglobin 8.3 (*)    HCT 25.7 (*)    Neutro Abs 0.9 (*)    All other components within normal limits  URINALYSIS, W/ REFLEX TO CULTURE (INFECTION SUSPECTED) - Abnormal; Notable for the following components:   APPearance HAZY (*)    Specific Gravity, Urine 1.004 (*)    Leukocytes,Ua TRACE (*)    Bacteria, UA RARE (*)    All other components within normal limits  MAGNESIUM - Abnormal; Notable for the following components:   Magnesium 1.3 (*)    All other components within normal limits  RESP PANEL BY RT-PCR (RSV, FLU A&B, COVID)  [id number]  CULTURE, BLOOD (ROUTINE X 2)  CULTURE, BLOOD (ROUTINE X 2)  PROTIME-INR  APTT  CBC  BASIC METABOLIC PANEL  MAGNESIUM  I-STAT CG4 LACTIC ACID, ED    EKG None  Radiology DG Chest 1 View Result Date: 11/11/2023 CLINICAL DATA:  [id number] Sepsis (HCC) [id number]. Fever. Nausea. Fatigue. EXAM: CHEST  1 VIEW COMPARISON:  10/04/2023. FINDINGS: Low lung volume. Redemonstration of heterogeneous opacity overlying the right lower lateral hemithorax which corresponds to loculated hydropneumothorax on the prior chest CT scan from 10/03/2023. There is also diffuse homogeneous opacification of right hemithorax in comparison to the left, which may be due to layering pleural effusion. Bilateral lung fields are otherwise clear. No acute dense consolidation or lung collapse. Left lateral costophrenic angle is clear. No pneumothorax on either side. Stable cardio-mediastinal silhouette. No acute osseous abnormalities. The soft tissues are within normal limits. IMPRESSION: *Redemonstration of heterogeneous opacity overlying the right lower lateral hemithorax which corresponds to loculated hydropneumothorax on the prior chest CT scan. *There  is diffuse homogeneous opacification of right hemithorax in comparison to the left, which may be due to layering pleural effusion. Electronically Signed   By: Jules Schick M.D.   On: 11/11/2023 17:01    Procedures Procedures    Medications Ordered in ED Medications  magnesium sulfate IVPB 2 g 50 mL (has no administration in time range)  lactated ringers infusion (has no administration in time range)  ceFEPIme (MAXIPIME) 2 g in sodium chloride 0.9 % 100 mL IVPB (has no administration in time range)  enoxaparin (LOVENOX) injection 40 mg (has no administration in time range)  potassium chloride (KLOR-CON) packet 40 mEq (has no administration in time range)  acetaminophen (TYLENOL) tablet 650 mg (has no administration in time range)    Or  acetaminophen (TYLENOL) suppository 650 mg (has no administration in time range)  ondansetron (ZOFRAN)  tablet 4 mg (has no administration in time range)    Or  ondansetron (ZOFRAN) injection 4 mg (has no administration in time range)  senna-docusate (Senokot-S) tablet 1 tablet (has no administration in time range)  albuterol (PROVENTIL) (2.5 MG/3ML) 0.083% nebulizer solution 3 mL (has no administration in time range)  methimazole (TAPAZOLE) tablet 20 mg (has no administration in time range)  pantoprazole (PROTONIX) EC tablet 40 mg (has no administration in time range)  simvastatin (ZOCOR) tablet 20 mg (has no administration in time range)  lactated ringers bolus 1,000 mL (0 mLs Intravenous Stopped 11/11/23 1913)    And  lactated ringers bolus 1,000 mL (0 mLs Intravenous Stopped 11/11/23 1914)    And  lactated ringers bolus 250 mL (0 mLs Intravenous Stopped 11/11/23 2003)  ceFEPIme (MAXIPIME) 2 g in sodium chloride 0.9 % 100 mL IVPB (0 g Intravenous Stopped 11/11/23 1752)  metroNIDAZOLE (FLAGYL) IVPB 500 mg (0 mg Intravenous Stopped 11/11/23 2042)  vancomycin (VANCOCIN) IVPB 1000 mg/200 mL premix (0 mg Intravenous Stopped 11/11/23 1913)  ondansetron  (ZOFRAN) injection 4 mg (4 mg Intravenous Given 11/11/23 1648)  acetaminophen (TYLENOL) tablet 650 mg (650 mg Oral Given 11/11/23 1658)  potassium chloride SA (KLOR-CON M) CR tablet 40 mEq (40 mEq Oral Given 11/11/23 1919)    ED Course/ Medical Decision Making/ A&P                                 Medical Decision Making 78 year old female with past medical history of recently diagnosed lung cancer as well as hyperlipidemia presenting to the emergency department today with fever at home.  The patient is tachycardic here with marginal blood pressures.  I will initiate a sepsis workup on the patient here.  Will cover her empirically with vancomycin and cefepime.  Will give her IV fluids.  Give her Zofran for nausea.  Will also give her Tylenol as her initial temp is 99.9.  I will obtain a chest x-ray and urinalysis to evaluate for causes for her fever as well as an RSV/COVID/flu swab.  I will reevaluate for ultimate disposition.  The patient's blood pressures improved with fluids.  Chest x-ray shows loculated hydropneumothorax consistent with imaging.  The patient remains stable here.  Calls placed hospital service for admission.  Amount and/or Complexity of Data Reviewed Labs: ordered.  Risk OTC drugs. Prescription drug management. Decision regarding hospitalization.           Final Clinical Impression(s) / ED Diagnoses Final diagnoses:  Fever, unspecified fever cause  Generalized weakness    Rx / DC Orders ED Discharge Orders     None         Durwin Glaze, MD 11/11/23 2236

## 2023-11-11 NOTE — Hospital Course (Addendum)
 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  Patient was admitted with neutropenic fever.  Patient was admitted with absolute neutrophil count of 0.9 and WBC of 2.2.  Patient has been on IV antibiotics.  ANC normalized and abx were completed.  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 -Follow-up with primary care provider and oncology team on discharge.,   Stage IV lung adenocarcinoma with malignant right pleural effusion -Underwent thoracentesis on 11/13/2023 yielding 350 cc of clear yellow fluid. -Fluid analysis consistent with exudate however no different from previous thoracentesis in January -Antibiotics discontinued  -Pleural fluid culture showed no growth  -Follow-up oncology as outpatient   Hyperthyroidism -Continue methimazole 20 mg p.o. daily   Hyperlipidemia -Continue Zocor 20 mg p.o. daily   GERD -Continue Protonix   Sinusitis -Start Flonase, Mucinex, Claritin

## 2023-11-12 ENCOUNTER — Inpatient Hospital Stay (HOSPITAL_COMMUNITY): Payer: Medicare Other

## 2023-11-12 DIAGNOSIS — C3490 Malignant neoplasm of unspecified part of unspecified bronchus or lung: Secondary | ICD-10-CM | POA: Diagnosis not present

## 2023-11-12 DIAGNOSIS — E876 Hypokalemia: Secondary | ICD-10-CM | POA: Diagnosis not present

## 2023-11-12 DIAGNOSIS — C3491 Malignant neoplasm of unspecified part of right bronchus or lung: Secondary | ICD-10-CM

## 2023-11-12 DIAGNOSIS — D709 Neutropenia, unspecified: Secondary | ICD-10-CM | POA: Diagnosis not present

## 2023-11-12 DIAGNOSIS — J910 Malignant pleural effusion: Secondary | ICD-10-CM | POA: Diagnosis not present

## 2023-11-12 LAB — BASIC METABOLIC PANEL
Anion gap: 11 (ref 5–15)
BUN: 5 mg/dL — ABNORMAL LOW (ref 8–23)
CO2: 27 mmol/L (ref 22–32)
Calcium: 7.8 mg/dL — ABNORMAL LOW (ref 8.9–10.3)
Chloride: 95 mmol/L — ABNORMAL LOW (ref 98–111)
Creatinine, Ser: 0.71 mg/dL (ref 0.44–1.00)
GFR, Estimated: >60 mL/min (ref >60)
Glucose, Bld: 110 mg/dL — ABNORMAL HIGH (ref 70–99)
Potassium: 3.5 mmol/L (ref 3.5–5.1)
Sodium: 133 mmol/L — ABNORMAL LOW (ref 135–145)

## 2023-11-12 LAB — CBC
HCT: 24.4 % — ABNORMAL LOW (ref 36.0–46.0)
HCT: 24.6 % — ABNORMAL LOW (ref 36.0–46.0)
Hemoglobin: 8.1 g/dL — ABNORMAL LOW (ref 12.0–15.0)
Hemoglobin: 8.1 g/dL — ABNORMAL LOW (ref 12.0–15.0)
MCH: 26.8 pg (ref 26.0–34.0)
MCH: 26.9 pg (ref 26.0–34.0)
MCHC: 33.2 g/dL (ref 30.0–36.0)
MCHC: 32.9 g/dL (ref 30.0–36.0)
MCV: 80.8 fL (ref 80.0–100.0)
MCV: 81.7 fL (ref 80.0–100.0)
Platelets: 233 10*3/uL (ref 150–400)
Platelets: 233 10*3/uL (ref 150–400)
RBC: 3.02 MIL/uL — ABNORMAL LOW (ref 3.87–5.11)
RBC: 3.01 MIL/uL — ABNORMAL LOW (ref 3.87–5.11)
RDW: 15.6 % — ABNORMAL HIGH (ref 11.5–15.5)
RDW: 15.7 % — ABNORMAL HIGH (ref 11.5–15.5)
WBC: 1.7 10*3/uL — ABNORMAL LOW (ref 4.0–10.5)
WBC: 1.7 10*3/uL — ABNORMAL LOW (ref 4.0–10.5)
nRBC: 0 % (ref 0.0–0.2)
nRBC: 0 % (ref 0.0–0.2)

## 2023-11-12 LAB — DIFFERENTIAL
Abs Immature Granulocytes: 0.01 10*3/uL (ref 0.00–0.07)
Basophils Absolute: 0 10*3/uL (ref 0.0–0.1)
Basophils Relative: 0 %
Eosinophils Absolute: 0 10*3/uL (ref 0.0–0.5)
Eosinophils Relative: 0 %
Immature Granulocytes: 1 %
Lymphocytes Relative: 27 %
Lymphs Abs: 0.4 10*3/uL — ABNORMAL LOW (ref 0.7–4.0)
Monocytes Absolute: 0.3 10*3/uL (ref 0.1–1.0)
Monocytes Relative: 20 %
Neutro Abs: 0.9 10*3/uL — ABNORMAL LOW (ref 1.7–7.7)
Neutrophils Relative %: 52 %

## 2023-11-12 LAB — TROPONIN I (HIGH SENSITIVITY): Troponin I (High Sensitivity): 4 ng/L (ref <18)

## 2023-11-12 LAB — D-DIMER, QUANTITATIVE: D-Dimer, Quant: 3.43 ug{FEU}/mL — ABNORMAL HIGH (ref 0.00–0.50)

## 2023-11-12 LAB — MAGNESIUM: Magnesium: 1.7 mg/dL (ref 1.7–2.4)

## 2023-11-12 MED ORDER — POTASSIUM CHLORIDE CRYS ER 20 MEQ PO TBCR
40.0000 meq | EXTENDED_RELEASE_TABLET | Freq: Once | ORAL | Status: AC
  Administered 2023-11-12: 40 meq via ORAL
  Filled 2023-11-12: qty 2

## 2023-11-12 MED ORDER — IOHEXOL 350 MG/ML SOLN
75.0000 mL | Freq: Once | INTRAVENOUS | Status: AC | PRN
Start: 2023-11-12 — End: 2023-11-12
  Administered 2023-11-12: 75 mL via INTRAVENOUS

## 2023-11-12 NOTE — Progress Notes (Signed)
 Transition of Care [facility]) - Inpatient Brief Assessment   Patient Details  Name: Achsah Mcquade MRN: [id number] Date of Birth: [birth year]  Transition of Care [facility]) CM/SW Contact:    Janae Bridgeman, RN Phone Number: 11/12/2023, 3:01 PM   Clinical Narrative: CM met with the patient at the bedside to discuss TOC needs.  The patient admitted for fever and neutropenia, recent start for chemotherapy for lung CA. Patient remains on room air.  Patient was offered Medicare choice regarding home health services and patient declines home health services at this time.  The patient states that her friend visits everyday and she does not need home health therapy.  No other TOC needs at this time.   Transition of Care Asessment: Insurance and Status: (P) Insurance coverage has been reviewed Patient has primary care physician: (P) Yes Home environment has been reviewed: (P) from home with son Prior level of function:: (P) Independent Prior/Current Home Services: (P) No current home services Social Drivers of Health Review: (P) SDOH reviewed interventions complete Readmission risk has been reviewed: (P) Yes Transition of care needs: (P) transition of care needs identified, TOC will continue to follow

## 2023-11-12 NOTE — Progress Notes (Signed)
 [facility] PROGRESS NOTE   Amanda Potter ZOX:[id number] DOB: October 25, 1945 DOA: 11/11/2023  PCP: Richmond Campbell., PA-C  Brief History: 78 y.o. 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  Patient was noted to be neutropenic.  She was hospitalized for further management.  Consultants: Medical oncology will be notified.  Procedures: None yet    Subjective/Interval History: Patient denies any nausea vomiting diarrhea abdominal pain, dysuria frequent urination.  She did have a skin rash on her back few days ago which appears to have resolved.  Denies any joint pains.  Shortness of breath is about the same.    Assessment/Plan:  Neutropenic fever Absolute neutrophil counts are less than 1000.  This is in the setting of recent chemotherapy. Had fever of 101 F. Chest imaging study findings were similar to before.  Symptoms are not much worse than before.  So it is unlikely that her chest x-ray findings responsible for her fever.  However if she continues to be febrile or has worsening respiratory symptoms then we may have to consider doing thoracentesis.  Of note she was seen by pulmonology recently on 2/13.  Monitor ANC on a daily basis.  Medical oncology will be notified. Patient has been started on cefepime. Follow-up on blood cultures. If there is no improvement in her ANC will consider marrow stimulating agents after discussing with oncology.  Hypotension Improved with IV hydration.  Monitor closely.  Stage IV lung adenocarcinoma with malignant right pleural effusion Followed by Dr. Arbutus Ped.  She is status post 4 cycles of treatment with Keytruda, Alimta and carboplatin on 2//25.  Hypokalemia and hypomagnesemia/hyponatremia Supplemented.   Improvement in levels noted.  Normocytic anemia No evidence of overt bleeding.  Monitor hemoglobin on a daily basis.  History of hyperthyroidism Continue with methimazole.  Hyperlipidemia Continue statin.  History of GERD Continue PPI.   DVT Prophylaxis: Lovenox Code Status: Full code Family Communication: Discussed with patient Disposition Plan: Hopefully home when improved.  PT and OT evaluation  Status is: Inpatient Remains inpatient appropriate because: Neutropenic fever      Medications: Scheduled:  enoxaparin (LOVENOX) injection  40 mg Subcutaneous Q24H   methimazole  20 mg Oral Daily   pantoprazole  40 mg Oral Daily   potassium chloride  40 mEq Oral Once   simvastatin  20 mg Oral QHS   Continuous:  ceFEPime (MAXIPIME) IV 2 g (11/12/23 0445)   WJX:[id number] **OR** acetaminophen, albuterol, ondansetron **OR** ondansetron (ZOFRAN) IV, senna-docusate  Antibiotics: Anti-infectives (From admission, onward)    Start     Dose/Rate Route Frequency Ordered Stop   11/12/23 0500  ceFEPIme (MAXIPIME) 2 g in sodium chloride 0.9 % 100 mL IVPB        2 g 200 mL/hr over 30 Minutes Intravenous Every 12 hours 11/11/23 2041     11/11/23 1615  ceFEPIme (MAXIPIME) 2 g in sodium chloride 0.9 % 100 mL IVPB        2 g 200 mL/hr over 30 Minutes Intravenous  Once 11/11/23 1607 11/11/23 1752   11/11/23 1615  metroNIDAZOLE (FLAGYL) IVPB 500 mg        500 mg 100 mL/hr over 60 Minutes Intravenous  Once 11/11/23 1607 11/11/23 2042   11/11/23 1615  vancomycin (VANCOCIN) IVPB 1000 mg/200 mL premix        1,000  mg 200 mL/hr over 60 Minutes Intravenous  Once 11/11/23 1607 11/11/23 1913       Objective:  Vital Signs  Vitals:   11/12/23 0130 11/12/23 0306 11/12/23 0456 11/12/23 0827  BP: (!) 101/59 114/62 122/63 134/69  Pulse: 96 (!) 107 76 (!) 101  Resp: (!) 21 18 18 17   Temp:   98.9 F (37.2 C) 98.3 F (36.8 C)  TempSrc:   Oral Oral  SpO2: 94% 93% 93% 96%  Weight:       Height:        Intake/Output Summary (Last 24 hours) at 11/12/2023 0938 Last data filed at 11/12/2023 0631 Gross per 24 hour  Intake 3648.75 ml  Output --  Net 3648.75 ml   Filed Weights   11/11/23 1542  Weight: 70.6 kg    General appearance: Awake alert.  In no distress Resp: Diminished air entry on the right base.  Dullness to percussion.  Few crackles. Cardio: S1-S2 is normal regular.  No S3-S4.  No rubs murmurs or bruit GI: Abdomen is soft.  Nontender nondistended.  Bowel sounds are present normal.  No masses organomegaly Extremities: No edema.  Full range of motion of lower extremities. Neurologic: Alert and oriented x3.  No focal neurological deficits.    Lab Results:  Data Reviewed: I have personally reviewed following labs and reports of the imaging studies  CBC: Recent Labs  Lab 11/06/23 0952 11/11/23 1550 11/12/23 0651  WBC 1.9* 2.2* 1.7*  NEUTROABS 0.8* 0.9*  --   HGB 9.0* 8.3* 8.1*  HCT 27.3* 25.7* 24.4*  MCV 80.5 80.8 80.8  PLT 320 236 233    Basic Metabolic Panel: Recent Labs  Lab 11/06/23 0952 11/11/23 1550 11/12/23 0651  NA 134* 130* 133*  K 3.5 2.9* 3.5  CL 95* 94* 95*  CO2 31 26 27   GLUCOSE 107* 116* 110*  BUN 7* 5* 5*  CREATININE 0.60 0.76 0.71  CALCIUM 8.4* 7.8* 7.8*  MG  --  1.3* 1.7    GFR: Estimated Creatinine Clearance: 53.4 mL/min (by C-G formula based on SCr of 0.71 mg/dL).  Liver Function Tests: Recent Labs  Lab 11/06/23 0952 11/11/23 1550  AST 15 14*  ALT 10 10  ALKPHOS 102 87  BILITOT 0.4 0.5  PROT 6.5 5.7*  ALBUMIN 2.9* 1.8*      Coagulation Profile: Recent Labs  Lab 11/11/23 1550  INR 1.2     Recent Results (from the past 240 hours)  Culture, blood (Routine x 2)     Status: None (Preliminary result)   Collection Time: 11/11/23  3:42 PM   Specimen: BLOOD RIGHT HAND  Result Value Ref Range Status   Specimen Description BLOOD RIGHT HAND  Final   Special Requests   Final    BOTTLES DRAWN AEROBIC AND  ANAEROBIC Blood Culture results may not be optimal due to an inadequate volume of blood received in culture bottles   Culture   Final    NO GROWTH < 24 HOURS Performed at [facility], [address] [place], [place] [address]    Report Status PENDING  Incomplete  Culture, blood (Routine x 2)     Status: None (Preliminary result)   Collection Time: 11/11/23  3:47 PM   Specimen: BLOOD RIGHT FOREARM  Result Value Ref Range Status   Specimen Description BLOOD RIGHT FOREARM  Final   Special Requests   Final    BOTTLES DRAWN AEROBIC AND ANAEROBIC Blood Culture adequate volume  Culture   Final    NO GROWTH < 24 HOURS Performed at [facility], [address], [place], [place] [address]    Report Status PENDING  Incomplete  Resp panel by RT-PCR (RSV, Flu A&B, Covid) Anterior Nasal Swab     Status: None   Collection Time: 11/11/23  4:06 PM   Specimen: Anterior Nasal Swab  Result Value Ref Range Status   SARS Coronavirus 2 by RT PCR NEGATIVE NEGATIVE Final   Influenza A by PCR NEGATIVE NEGATIVE Final   Influenza B by PCR NEGATIVE NEGATIVE Final    Comment: (NOTE) The Xpert Xpress SARS-CoV-2/FLU/RSV plus assay is intended as an aid in the diagnosis of influenza from Nasopharyngeal swab specimens and should not be used as a sole basis for treatment. Nasal washings and aspirates are unacceptable for Xpert Xpress SARS-CoV-2/FLU/RSV testing.  Fact Sheet for Patients: BloggerCourse.com  Fact Sheet for Healthcare Providers: SeriousBroker.it  This test is not yet approved or cleared by the [place] FDA and has been authorized for detection and/or diagnosis of SARS-CoV-2 by FDA under an Emergency Use Authorization (EUA). This EUA will remain in effect (meaning this test can be used) for the duration of the COVID-19 declaration under Section 564(b)(1) of the Act, 21 U.S.C. section 360bbb-3(b)(1), unless the authorization is  terminated or revoked.     Resp Syncytial Virus by PCR NEGATIVE NEGATIVE Final    Comment: (NOTE) Fact Sheet for Patients: BloggerCourse.com  Fact Sheet for Healthcare Providers: SeriousBroker.it  This test is not yet approved or cleared by the [place] FDA and has been authorized for detection and/or diagnosis of SARS-CoV-2 by FDA under an Emergency Use Authorization (EUA). This EUA will remain in effect (meaning this test can be used) for the duration of the COVID-19 declaration under Section 564(b)(1) of the Act, 21 U.S.C. section 360bbb-3(b)(1), unless the authorization is terminated or revoked.  Performed at [facility], [address], [place], [place] [address]       Radiology Studies: DG Chest 1 View Result Date: 11/11/2023 CLINICAL DATA:  [id number] Sepsis (HCC) [id number]. Fever. Nausea. Fatigue. EXAM: CHEST  1 VIEW COMPARISON:  10/04/2023. FINDINGS: Low lung volume. Redemonstration of heterogeneous opacity overlying the right lower lateral hemithorax which corresponds to loculated hydropneumothorax on the prior chest CT scan from 10/03/2023. There is also diffuse homogeneous opacification of right hemithorax in comparison to the left, which may be due to layering pleural effusion. Bilateral lung fields are otherwise clear. No acute dense consolidation or lung collapse. Left lateral costophrenic angle is clear. No pneumothorax on either side. Stable cardio-mediastinal silhouette. No acute osseous abnormalities. The soft tissues are within normal limits. IMPRESSION: *Redemonstration of heterogeneous opacity overlying the right lower lateral hemithorax which corresponds to loculated hydropneumothorax on the prior chest CT scan. *There is diffuse homogeneous opacification of right hemithorax in comparison to the left, which may be due to layering pleural effusion. Electronically Signed   By: Jules Schick M.D.   On: 11/11/2023  17:01       LOS: 1 day   [employer]  [facility] Pager on [email or web address]  11/12/2023, 9:38 AM

## 2023-11-12 NOTE — Evaluation (Signed)
 Physical Therapy Evaluation Patient Details Name: Amanda Potter MRN: [id number] DOB: 12-17-1945 Today's Date: 11/12/2023  History of Present Illness  Pt is a 78 y/o F admitted on 11/11/23 after presenting for evaluation of a fever. Pt found to be neutropenic. 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 Pt reports she lives with her son & has family assistance 24/7. Pt notes since last admission she has been ambulating around the home by holding to rails on the walls (notes her home was originally intended to be a nursing home). On this date, pt is able to complete bed mobility with extra effort & use of hospital bed features, STS with min assist, & ambulate in room without AD/with HHA with min assist. Pt limited by fatigue. Will continue to follow pt acutely to progress mobility as able.      If plan is discharge home, recommend the following: A little help with walking and/or transfers;A little help with bathing/dressing/bathroom;Assistance with cooking/housework;Assist for transportation;Help with stairs or ramp for entrance   Can travel by private vehicle        Equipment Recommendations None recommended by PT  Recommendations for Other Services       Functional Status Assessment Patient has had a recent decline in their functional status and demonstrates the ability to make significant improvements in function in a reasonable and predictable amount of time.     Precautions / Restrictions Precautions Precautions: Fall Restrictions Weight Bearing Restrictions Per Provider Order: No      Mobility  Bed Mobility Overal bed mobility: Needs Assistance Bed Mobility: Supine to Sit, Sit to Supine     Supine to sit: Contact guard, HOB elevated, Used rails Sit to supine: Supervision, HOB elevated, Used rails    General bed mobility comments: extra time & effort to upright trunk to sitting EOB with use of bed rails, HOB elevated    Transfers Overall transfer level: Needs assistance Equipment used: None Transfers: Sit to/from Stand Sit to Stand: Min assist           General transfer comment: STS from elevated EOB, STS from low toilet with pt pushing to stand with 1UE & using other UE on grab bar    Ambulation/Gait Ambulation/Gait assistance: Contact guard assist, Min assist Gait Distance (Feet): 10 Feet (+ 10 ft) Assistive device: 1 person hand held assist, None Gait Pattern/deviations: Decreased step length - right, Decreased step length - left, Decreased stride length, Decreased dorsiflexion - right, Decreased dorsiflexion - left, Trunk flexed Gait velocity: decreased     General Gait Details: Pt ambulates bed<>bathroom.  Stairs            Wheelchair Mobility     Tilt Bed    Modified Rankin (Stroke Patients Only)       Balance Overall balance assessment: No apparent balance deficits (not formally assessed), Needs assistance Sitting-balance support: Feet supported, No upper extremity supported Sitting balance-Leahy Scale: Fair Sitting balance - Comments: Pt performed peri hygiene sitting on toilet without LOB.   Standing balance support: No upper extremity supported, During functional activity, Single extremity supported Standing balance-Leahy Scale: Poor                               Pertinent Vitals/Pain Pain Assessment Pain Assessment: No/denies pain (pt denies c/o pain during  PT session but notes she had L sided chest pain earlier (nurse already aware, reports blood work, EKG & D dimer have been ordered))    Home Living Family/patient expects to be discharged to:: Private residence Living Arrangements: Children Available Help at Discharge: Available 24 hours/day;Friend(s);Family Type of Home: House Home Access: Ramped entrance       Home  Layout: One level Home Equipment: Shower seat;Grab bars - toilet;Cane - quad;Cane - single point;Lift chair;Wheelchair - Forensic psychologist (2 wheels);BSC/3in1 Additional Comments: Pt reports she has rails mounted on the walls of her house as her house was originally built to be a nursing home.    Prior Function               Mobility Comments: Pt reports since last admission she was ambulating without assistance, holding to rails in hallway.       Extremity/Trunk Assessment   Upper Extremity Assessment Upper Extremity Assessment: Generalized weakness    Lower Extremity Assessment Lower Extremity Assessment: Generalized weakness       Communication   Communication Communication: No apparent difficulties    Cognition Arousal: Alert Behavior During Therapy: WFL for tasks assessed/performed   PT - Cognitive impairments: No apparent impairments                                 Cueing       General Comments General comments (skin integrity, edema, etc.): Pt with continent void on toilet, max HR 138 bpm during session, no c/o adverse symptoms during session.    Exercises     Assessment/Plan    PT Assessment Patient needs continued PT services  PT Problem List Decreased activity tolerance;Decreased strength;Cardiopulmonary status limiting activity;Decreased knowledge of use of DME;Decreased balance;Decreased safety awareness;Decreased mobility;Decreased knowledge of precautions       PT Treatment Interventions DME instruction;Balance training;Modalities;Gait training;Neuromuscular re-education;Functional mobility training;Therapeutic activities;Therapeutic exercise;Manual techniques;Patient/family education    PT Goals (Current goals can be found in the Care Plan section)  Acute Rehab PT Goals Patient Stated Goal: not be so tired, get better PT Goal Formulation: With patient Time For Goal Achievement: 11/26/23 Potential to Achieve Goals: Good     Frequency Min 1X/week     Co-evaluation               AM-PAC PT "6 Clicks" Mobility  Outcome Measure Help needed turning from your back to your side while in a flat bed without using bedrails?: A Little Help needed moving from lying on your back to sitting on the side of a flat bed without using bedrails?: A Little Help needed moving to and from a bed to a chair (including a wheelchair)?: A Little Help needed standing up from a chair using your arms (e.g., wheelchair or bedside chair)?: A Little Help needed to walk in hospital room?: A Little Help needed climbing 3-5 steps with a railing? : A Lot 6 Click Score: 17    End of Session   Activity Tolerance: Patient limited by fatigue Patient left: in bed;with bed alarm set;with call bell/phone within reach Nurse Communication: Mobility status PT Visit Diagnosis: Muscle weakness (generalized) (M62.81);Other abnormalities of gait and mobility (R26.89);Unsteadiness on feet (R26.81)    Time: 1610-9604 PT Time Calculation (min) (ACUTE ONLY): 14 min   Charges:   PT Evaluation $PT Eval Moderate Complexity: 1 Mod   PT General Charges $$ ACUTE PT VISIT: 1 Visit  Aleda Grana, PT, DPT 11/12/23, 1:42 PM   Sandi Mariscal 11/12/2023, 1:40 PM

## 2023-11-12 NOTE — Progress Notes (Signed)
 Mobility Specialist Progress Note:    11/12/23 1223  Mobility  Activity Ambulated with assistance in room;Ambulated with assistance to bathroom  Level of Assistance Contact guard assist, steadying assist  Assistive Device Other (Comment) (HHA)  Distance Ambulated (ft) 20 ft  Activity Response Tolerated well  Mobility Referral Yes  Mobility visit 1 Mobility  Mobility Specialist Start Time (ACUTE ONLY) 1050  Mobility Specialist Stop Time (ACUTE ONLY) 1105  Mobility Specialist Time Calculation (min) (ACUTE ONLY) 15 min   Pt received in bed agreeable to mobility. Pt requested to use the BR. Required contact guard w/ HHA. After using the BR pt declined ambulating further d/t Sob and fatigue. HR was 112 and SPO2 97% seated EOB. Pt requested to stay EOB. Call bell and personal belongings in reach. Will attempt to walk pt further this afternoon if time allows.  Thompson Grayer Mobility Specialist  Please contact vis Secure Chat or  Rehab Office [phone]

## 2023-11-12 NOTE — ED Notes (Signed)
 ED TO INPATIENT HANDOFF REPORT  ED Nurse Name and Phone #:   S Name/Age/Gender Amanda Potter 78 y.o. female Room/Bed: 046C/046C  Code Status   Code Status: Full Code  Home/SNF/Other Home Patient oriented to: self, place, time, and situation Is this baseline? Yes   Triage Complete: Triage complete  Chief Complaint Neutropenic fever (HCC) [D70.9, R50.81]  Triage Note Pt had her first chemo treatment on 2/4, pt developed fever today of 101. Pt c.o feeling clammy, intermittent nausea, fatigue.   Allergies Allergies  Allergen Reactions   Latex Rash   Tape Rash    Glue on tape    Level of Care/Admitting Diagnosis ED Disposition     ED Disposition  Admit   Condition  --   Comment  Hospital Area: [facility] [[id number]]  Level of Care: Telemetry Medical [104]  May admit patient to [facility] or [facility] if equivalent level of care is available:: No  Covid Evaluation: Confirmed COVID Negative  Diagnosis: Neutropenic fever [facility]) [[id number]]  Admitting Physician: Charlsie Quest [[id number]]  Attending Physician: Charlsie Quest [[id number]]  Certification:: I certify this patient will need inpatient services for at least 2 midnights  Expected Medical Readiness: 11/13/2023          B Medical/Surgery History Past Medical History:  Diagnosis Date   Deviated nasal septum    Environmental allergies    GERD (gastroesophageal reflux disease)    Hyperlipidemia    Hypertension    Perforation of colon (HCC) 2006   Following colonoscopy   Sinusitis, acute    Past Surgical History:  Procedure Laterality Date   CHOLECYSTECTOMY     COLON SURGERY Right 2006   hemicolectomy   COLONOSCOPY WITH PROPOFOL N/A 05/30/2019   Procedure: COLONOSCOPY WITH PROPOFOL;  Surgeon: Jeani Hawking, MD;  Location: [facility];  Service: Endoscopy;  Laterality: N/A;   ENTEROSCOPY N/A 05/30/2019   Procedure: ENTEROSCOPY;  Surgeon: Jeani Hawking, MD;  Location: [facility];   Service: Endoscopy;  Laterality: N/A;   HERNIA REPAIR  [id number]   INCISIONAL HERNIA REPAIR N/A 03/03/2013   Procedure: LAPAROSCOPIC INCISIONAL HERNIA REPAIR;  Surgeon: Adolph Pollack, MD;  Location: [facility];  Service: General;  Laterality: N/A;  LYSIS OF ADHESIONS FOR 60 MINUTES   INSERTION OF MESH N/A 03/03/2013   Procedure: INSERTION OF MESH;  Surgeon: Adolph Pollack, MD;  Location: [facility];  Service: General;  Laterality: N/A;   POLYPECTOMY  05/30/2019   Procedure: POLYPECTOMY;  Surgeon: Jeani Hawking, MD;  Location: [facility];  Service: Endoscopy;;   TONSILLECTOMY     TUBAL LIGATION       A IV Location/Drains/Wounds Patient Lines/Drains/Airways Status     Active Line/Drains/Airways     Name Placement date Placement time Site Days   Peripheral IV 11/11/23 22 G Anterior;Right Forearm 11/11/23  1648  Forearm  1   Peripheral IV 11/11/23 22 G 0.75" Anterior;Proximal;Right Forearm 11/11/23  1655  Forearm  1            Intake/Output Last 24 hours  Intake/Output Summary (Last 24 hours) at 11/12/2023 0152 Last data filed at 11/11/2023 2341 Gross per 24 hour  Intake 3145.23 ml  Output --  Net 3145.23 ml    Labs/Imaging Results for orders placed or performed during the hospital encounter of 11/11/23 (from the past 48 hours)  Comprehensive metabolic panel     Status: Abnormal   Collection Time: 11/11/23  3:50 PM  Result Value Ref Range  Sodium 130 (L) 135 - 145 mmol/L   Potassium 2.9 (L) 3.5 - 5.1 mmol/L   Chloride 94 (L) 98 - 111 mmol/L   CO2 26 22 - 32 mmol/L   Glucose, Bld 116 (H) 70 - 99 mg/dL    Comment: Glucose reference range applies only to samples taken after fasting for at least 8 hours.   BUN 5 (L) 8 - 23 mg/dL   Creatinine, Ser 9.60 0.44 - 1.00 mg/dL   Calcium 7.8 (L) 8.9 - 10.3 mg/dL   Total Protein 5.7 (L) 6.5 - 8.1 g/dL   Albumin 1.8 (L) 3.5 - 5.0 g/dL   AST 14 (L) 15 - 41 U/L   ALT 10 0 - 44 U/L   Alkaline Phosphatase 87 38 - 126 U/L   Total Bilirubin  0.5 0.0 - 1.2 mg/dL   GFR, Estimated >45 >40 mL/min    Comment: (NOTE) Calculated using the CKD-EPI Creatinine Equation (2021)    Anion gap 10 5 - 15    Comment: Performed at [facility], [address], [place], [place] [address]  CBC with Differential     Status: Abnormal   Collection Time: 11/11/23  3:50 PM  Result Value Ref Range   WBC 2.2 (L) 4.0 - 10.5 K/uL   RBC 3.18 (L) 3.87 - 5.11 MIL/uL   Hemoglobin 8.3 (L) 12.0 - 15.0 g/dL   HCT 14.7 (L) 82.9 - 56.2 %   MCV 80.8 80.0 - 100.0 fL   MCH 26.1 26.0 - 34.0 pg   MCHC 32.3 30.0 - 36.0 g/dL   RDW 13.0 86.5 - 78.4 %   Platelets 236 150 - 400 K/uL   nRBC 0.0 0.0 - 0.2 %   Neutrophils Relative % 43 %   Neutro Abs 0.9 (L) 1.7 - 7.7 K/uL   Lymphocytes Relative 47 %   Lymphs Abs 1.0 0.7 - 4.0 K/uL   Monocytes Relative 9 %   Monocytes Absolute 0.2 0.1 - 1.0 K/uL   Eosinophils Relative 0 %   Eosinophils Absolute 0.0 0.0 - 0.5 K/uL   Basophils Relative 1 %   Basophils Absolute 0.0 0.0 - 0.1 K/uL   WBC Morphology See Note     Comment: Morphology unremarkable   RBC Morphology See Note     Comment: Morphology unremarkable   Smear Review See Note     Comment: Normal Platelet Morphology   nRBC 0 0 /100 WBC   Abs Immature Granulocytes 0.00 0.00 - 0.07 K/uL    Comment: Performed at [facility], [address] [place], [place] [address]  Protime-INR     Status: None   Collection Time: 11/11/23  3:50 PM  Result Value Ref Range   Prothrombin Time 14.9 11.4 - 15.2 seconds   INR 1.2 0.8 - 1.2    Comment: (NOTE) INR goal varies based on device and disease states. Performed at [facility], [address], [place], [place] [address]   Magnesium     Status: Abnormal   Collection Time: 11/11/23  3:50 PM  Result Value Ref Range   Magnesium 1.3 (L) 1.7 - 2.4 mg/dL    Comment: Performed at [facility], [address] [place], [place] [address]  I-Stat Lactic Acid, ED     Status: None   Collection Time:  11/11/23  3:55 PM  Result Value Ref Range   Lactic Acid, Venous 1.5 0.5 - 1.9 mmol/L  Resp panel by RT-PCR (RSV,  Flu A&B, Covid) Anterior Nasal Swab     Status: None   Collection Time: 11/11/23  4:06 PM   Specimen: Anterior Nasal Swab  Result Value Ref Range   SARS Coronavirus 2 by RT PCR NEGATIVE NEGATIVE   Influenza A by PCR NEGATIVE NEGATIVE   Influenza B by PCR NEGATIVE NEGATIVE    Comment: (NOTE) The Xpert Xpress SARS-CoV-2/FLU/RSV plus assay is intended as an aid in the diagnosis of influenza from Nasopharyngeal swab specimens and should not be used as a sole basis for treatment. Nasal washings and aspirates are unacceptable for Xpert Xpress SARS-CoV-2/FLU/RSV testing.  Fact Sheet for Patients: BloggerCourse.com  Fact Sheet for Healthcare Providers: SeriousBroker.it  This test is not yet approved or cleared by the [place] FDA and has been authorized for detection and/or diagnosis of SARS-CoV-2 by FDA under an Emergency Use Authorization (EUA). This EUA will remain in effect (meaning this test can be used) for the duration of the COVID-19 declaration under Section 564(b)(1) of the Act, 21 U.S.C. section 360bbb-3(b)(1), unless the authorization is terminated or revoked.     Resp Syncytial Virus by PCR NEGATIVE NEGATIVE    Comment: (NOTE) Fact Sheet for Patients: BloggerCourse.com  Fact Sheet for Healthcare Providers: SeriousBroker.it  This test is not yet approved or cleared by the [place] FDA and has been authorized for detection and/or diagnosis of SARS-CoV-2 by FDA under an Emergency Use Authorization (EUA). This EUA will remain in effect (meaning this test can be used) for the duration of the COVID-19 declaration under Section 564(b)(1) of the Act, 21 U.S.C. section 360bbb-3(b)(1), unless the authorization is terminated or revoked.  Performed at [facility], [address], [place], [place] [address]   APTT     Status: None   Collection Time: 11/11/23  4:06 PM  Result Value Ref Range   aPTT 33 24 - 36 seconds    Comment: Performed at [facility], [address], [place], [place] [address]  Urinalysis, w/ Reflex to Culture (Infection Suspected) -Urine, Clean Catch     Status: Abnormal   Collection Time: 11/11/23  4:50 PM  Result Value Ref Range   Specimen Source URINE, CATHETERIZED    Color, Urine YELLOW YELLOW   APPearance HAZY (A) CLEAR   Specific Gravity, Urine 1.004 (L) 1.005 - 1.030   pH 6.0 5.0 - 8.0   Glucose, UA NEGATIVE NEGATIVE mg/dL   Hgb urine dipstick NEGATIVE NEGATIVE   Bilirubin Urine NEGATIVE NEGATIVE   Ketones, ur NEGATIVE NEGATIVE mg/dL   Protein, ur NEGATIVE NEGATIVE mg/dL   Nitrite NEGATIVE NEGATIVE   Leukocytes,Ua TRACE (A) NEGATIVE   RBC / HPF 0-5 0 - 5 RBC/hpf   WBC, UA 0-5 0 - 5 WBC/hpf    Comment:        Reflex urine culture not performed if WBC <=10, OR if Squamous epithelial cells >5. If Squamous epithelial cells >5 suggest recollection.    Bacteria, UA RARE (A) NONE SEEN   Squamous Epithelial / HPF 6-10 0 - 5 /HPF   Mucus PRESENT     Comment: Performed at [facility], [address], [place], [place] [address]   DG Chest 1 View Result Date: 11/11/2023 CLINICAL DATA:  [id number] Sepsis [facility]) [id number]. Fever. Nausea. Fatigue. EXAM: CHEST  1 VIEW COMPARISON:  10/04/2023. FINDINGS: Low lung volume. Redemonstration of heterogeneous opacity overlying the right lower lateral hemithorax which corresponds to loculated hydropneumothorax on the prior chest CT scan from  10/03/2023. There is also diffuse homogeneous opacification of right hemithorax in comparison to the left, which may be due to layering pleural effusion. Bilateral lung fields are otherwise clear. No acute dense consolidation or lung collapse. Left lateral costophrenic angle is clear. No pneumothorax on either side. Stable  cardio-mediastinal silhouette. No acute osseous abnormalities. The soft tissues are within normal limits. IMPRESSION: *Redemonstration of heterogeneous opacity overlying the right lower lateral hemithorax which corresponds to loculated hydropneumothorax on the prior chest CT scan. *There is diffuse homogeneous opacification of right hemithorax in comparison to the left, which may be due to layering pleural effusion. Electronically Signed   By: Jules Schick M.D.   On: 11/11/2023 17:01    Pending Labs Unresulted Labs (From admission, onward)     Start     Ordered   11/12/23 0500  CBC  Tomorrow morning,   R        11/11/23 2043   11/12/23 0500  Basic metabolic panel  Tomorrow morning,   R        11/11/23 2043   11/12/23 0500  Magnesium  Tomorrow morning,   R        11/11/23 2043   11/11/23 1542  Culture, blood (Routine x 2)  BLOOD CULTURE X 2,   R      11/11/23 1541            Vitals/Pain Today's Vitals   11/11/23 2212 11/12/23 0000 11/12/23 0126 11/12/23 0130  BP:   136/75 (!) 101/59  Pulse:   (!) 101 96  Resp:   (!) 24 (!) 21  Temp: 98 F (36.7 C) 99 F (37.2 C)    TempSrc: Oral     SpO2:   98% 94%  Weight:      Height:      PainSc:  0-No pain      Isolation Precautions No active isolations  Medications Medications  lactated ringers infusion ( Intravenous New Bag/Given 11/11/23 2239)  ceFEPIme (MAXIPIME) 2 g in sodium chloride 0.9 % 100 mL IVPB (has no administration in time range)  enoxaparin (LOVENOX) injection 40 mg (40 mg Subcutaneous Given 11/11/23 2238)  potassium chloride (KLOR-CON) packet 40 mEq (40 mEq Oral Patient Refused/Not Given 11/12/23 0100)  acetaminophen (TYLENOL) tablet 650 mg (has no administration in time range)    Or  acetaminophen (TYLENOL) suppository 650 mg (has no administration in time range)  ondansetron (ZOFRAN) tablet 4 mg (has no administration in time range)    Or  ondansetron (ZOFRAN) injection 4 mg (has no administration in time  range)  senna-docusate (Senokot-S) tablet 1 tablet (has no administration in time range)  albuterol (PROVENTIL) (2.5 MG/3ML) 0.083% nebulizer solution 3 mL (has no administration in time range)  methimazole (TAPAZOLE) tablet 20 mg (has no administration in time range)  pantoprazole (PROTONIX) EC tablet 40 mg (has no administration in time range)  simvastatin (ZOCOR) tablet 20 mg (20 mg Oral Given 11/11/23 2238)  lactated ringers bolus 1,000 mL (0 mLs Intravenous Stopped 11/11/23 1913)    And  lactated ringers bolus 1,000 mL (0 mLs Intravenous Stopped 11/11/23 1914)    And  lactated ringers bolus 250 mL (0 mLs Intravenous Stopped 11/11/23 2003)  ceFEPIme (MAXIPIME) 2 g in sodium chloride 0.9 % 100 mL IVPB (0 g Intravenous Stopped 11/11/23 1752)  metroNIDAZOLE (FLAGYL) IVPB 500 mg (0 mg Intravenous Stopped 11/11/23 2042)  vancomycin (VANCOCIN) IVPB 1000 mg/200 mL premix (0 mg Intravenous Stopped 11/11/23 1913)  ondansetron (ZOFRAN) injection  4 mg (4 mg Intravenous Given 11/11/23 1648)  acetaminophen (TYLENOL) tablet 650 mg (650 mg Oral Given 11/11/23 1658)  potassium chloride SA (KLOR-CON M) CR tablet 40 mEq (40 mEq Oral Given 11/11/23 1919)  magnesium sulfate IVPB 2 g 50 mL (0 g Intravenous Stopped 11/11/23 2341)    Mobility walks     Focused Assessments    R Recommendations: See Admitting Provider Note  Report given to:   Additional Notes:

## 2023-11-12 NOTE — Progress Notes (Signed)
 Amanda Potter   DOB:03/22/1946   ZO#:[id number]      ASSESSMENT & PLAN:  1.  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  Initiated cycle 1 on 10/30/2023.  Then plan maintenance pemetrexed and pembrolizumab every 3 weeks. - Last seen 10/29/2023 oncology outpatient visit.  Will schedule next oncology outpatient visit after patient recovers. - Medical oncology/Dr. Arbutus Ped following  2.  Neutropenic fever - Likely due to recent chemotherapy on 10/30/2023. - WBC low 1.7 with ANC 0.9 today. - No G-CSF recommended at this time.  Will reconsider if ANC decreases closer to 0.5. - Patient complaining of elevated temp 101F at home.  Afebrile at this time - Continue IV antibiotics as ordered - Continue neutropenic precautions. - Monitor fever curve. - Monitor CBC, please ensure differential added to labs.  3.  History of hypokalemia - Low potassium 2.7 on 10/29/2023.  Also noted to be low 2.9 on admission  - Prescription given 10/30/2023 for potassium supplementation - Continue to monitor closely    Code Status Full   Subjective:  Patient seen awake and alert and ill-appearing.  Reports that she feels very tired like she has been working all day.  No shortness of breath is noted.  Denies pain, nausea or vomiting.  No acute distress is noted at this time.  Objective:  Vitals:   11/12/23 0827 11/12/23 1134  BP: 134/69   Pulse: (!) 101 (!) 115  Resp: 17 17  Temp: 98.3 F (36.8 C)   SpO2: 96% 96%     Intake/Output Summary (Last 24 hours) at 11/12/2023 1303 Last data filed at 11/12/2023 0631 Gross per 24 hour  Intake 3648.75 ml  Output --  Net 3648.75 ml     REVIEW OF SYSTEMS:   Constitutional: +fatigue, Denies fevers, chills or abnormal night sweats Eyes: Denies blurriness of vision, double vision or watery eyes Ears, nose, mouth, throat, and face:  Denies mucositis or sore throat Respiratory: Denies cough, dyspnea or wheezes Cardiovascular: Denies palpitation, chest discomfort or lower extremity swelling Gastrointestinal:  Denies nausea, heartburn or change in bowel habits Skin: Denies abnormal skin rashes Lymphatics: Denies new lymphadenopathy or easy bruising Neurological: Denies numbness, tingling or new weaknesses Behavioral/Psych: Mood is stable, no new changes  All other systems were reviewed with the patient and are negative.  PHYSICAL EXAMINATION: ECOG PERFORMANCE STATUS: 3 - Symptomatic, >50% confined to bed  Vitals:   11/12/23 0827 11/12/23 1134  BP: 134/69   Pulse: (!) 101 (!) 115  Resp: 17 17  Temp: 98.3 F (36.8 C)   SpO2: 96% 96%   Filed Weights   11/11/23 1542  Weight: 155 lb 10.3 oz (70.6 kg)    GENERAL: alert, +ill appearing SKIN: +pale skin color, texture, turgor are normal, no rashes or significant lesions EYES: normal, conjunctiva are pink and non-injected, sclera clear OROPHARYNX: no exudate, no erythema and lips, buccal mucosa, and tongue normal  NECK: supple, thyroid normal size, non-tender, without nodularity LYMPH: no palpable lymphadenopathy in the cervical, axillary or inguinal LUNGS: clear to auscultation and percussion with normal breathing effort HEART: regular rate & rhythm and no murmurs and no lower extremity edema ABDOMEN: abdomen soft, non-tender and normal bowel sounds MUSCULOSKELETAL: no cyanosis of digits and no clubbing  PSYCH: alert & oriented x 3 with fluent speech NEURO: no focal motor/sensory deficits   All questions were answered. The patient  knows to call the clinic with any problems, questions or concerns.   The total time spent in the appointment was 40 minutes encounter with patient including review of chart and various tests results, discussions about plan of care and coordination of care plan  Dawson Bills, NP 11/12/2023 1:03 PM    Labs Reviewed:  Lab Results   Component Value Date   WBC 1.7 (L) 11/12/2023   HGB 8.1 (L) 11/12/2023   HCT 24.4 (L) 11/12/2023   MCV 80.8 11/12/2023   PLT 233 11/12/2023   Recent Labs    10/29/23 0921 11/06/23 0952 11/11/23 1550 11/12/23 0651  NA 136 134* 130* 133*  K 2.7* 3.5 2.9* 3.5  CL 98 95* 94* 95*  CO2 32 31 26 27   GLUCOSE 127* 107* 116* 110*  BUN 6* 7* 5* 5*  CREATININE 0.51 0.60 0.76 0.71  CALCIUM 8.1* 8.4* 7.8* 7.8*  GFRNONAA >60 >60 >60 >60  PROT 5.8* 6.5 5.7*  --   ALBUMIN 2.6* 2.9* 1.8*  --   AST 10* 15 14*  --   ALT 10 10 10   --   ALKPHOS 146* 102 87  --   BILITOT 0.8 0.4 0.5  --     Studies Reviewed:  DG Chest 1 View Result Date: 11/11/2023 CLINICAL DATA:  [id number] Sepsis (HCC) [id number]. Fever. Nausea. Fatigue. EXAM: CHEST  1 VIEW COMPARISON:  10/04/2023. FINDINGS: Low lung volume. Redemonstration of heterogeneous opacity overlying the right lower lateral hemithorax which corresponds to loculated hydropneumothorax on the prior chest CT scan from 10/03/2023. There is also diffuse homogeneous opacification of right hemithorax in comparison to the left, which may be due to layering pleural effusion. Bilateral lung fields are otherwise clear. No acute dense consolidation or lung collapse. Left lateral costophrenic angle is clear. No pneumothorax on either side. Stable cardio-mediastinal silhouette. No acute osseous abnormalities. The soft tissues are within normal limits. IMPRESSION: *Redemonstration of heterogeneous opacity overlying the right lower lateral hemithorax which corresponds to loculated hydropneumothorax on the prior chest CT scan. *There is diffuse homogeneous opacification of right hemithorax in comparison to the left, which may be due to layering pleural effusion. Electronically Signed   By: Jules Schick M.D.   On: 11/11/2023 17:01   NM PET Image Initial (PI) Skull Base To Thigh Result Date: 10/22/2023 CLINICAL DATA:  Initial treatment strategy for malignant effusion. Metastatic  adenocarcinoma of unclear primary. EXAM: NUCLEAR MEDICINE PET SKULL BASE TO THIGH TECHNIQUE: 7.6 mCi F-18 FDG was injected intravenously. Full-ring PET imaging was performed from the skull base to thigh after the radiotracer. CT data was obtained and used for attenuation correction and anatomic localization. Fasting blood glucose: 85 mg/dl COMPARISON:  None Available. FINDINGS: Mediastinal blood pool activity: SUV max 1.8 Liver activity: SUV max NA NECK: No hypermetabolic lymph nodes in the neck. Incidental CT findings: None. CHEST: Rim of hypermetabolic pleural thickening within the RIGHT lower lobe there is associated with a loculated few effusion with pockets of gas as well as atelectasis of the RIGHT lower lobe. Atelectatic RIGHT lower lobe is intensely hypermetabolic with SUV max equal 10.1. Peripheral hypermetabolic pleural thickening with SUV max equal 5.2. Slightly more superior in the medial RIGHT lower lobe 17 mm in mild mm nodule image 62/4 with SUV max equal 16. Elsewhere within the RIGHT hemithorax there is scattered hypermetabolic pleural thickening. For example in the medial RIGHT upper lobe hypermetabolic pleural thickening with SUV max equal 4.5 on image 52. The mediastinum small paratracheal lymph  nodes are moderately hypermetabolic SUV max equal 4.2 in the RIGHT lower paratracheal nodal station on image 64. No hypermetabolic supraclavicular nodes. Incidental CT findings: None. ABDOMEN/PELVIS: No abnormal hypermetabolic activity within the liver, pancreas, adrenal glands, or spleen. No hypermetabolic lymph nodes in the abdomen or pelvis. Incidental CT findings: None. SKELETON: No focal hypermetabolic activity to suggest skeletal metastasis. Incidental CT findings: None. IMPRESSION: 1. And rim of hypermetabolic pleural thickening in the RIGHT lower lobe with intensely hypermetabolic atelectasis and nodularity in RIGHT lower lobe. Concern for primary pleural or lung malignancy including mesothelioma.  2. Loculated effusion in the RIGHT hemithorax with pockets of gas gas. Potential superimposed empyema. 3. Small mildly hypermetabolic mediastinal lymph nodes are indeterminate. 4. No evidence distant metastatic disease. Electronically Signed   By: Genevive Bi M.D.   On: 10/22/2023 11:27   MR BRAIN W WO CONTRAST Result Date: 10/18/2023 CLINICAL DATA:  Provided history: Malignant pleural effusion. Adenocarcinoma. Metastatic disease evaluation. EXAM: MRI HEAD WITHOUT AND WITH CONTRAST TECHNIQUE: Multiplanar, multiecho pulse sequences of the brain and surrounding structures were obtained without and with intravenous contrast. CONTRAST:  7mL GADAVIST GADOBUTROL 1 MMOL/ML IV SOLN COMPARISON:  None. FINDINGS: Brain: Mild generalized cerebral atrophy. Multifocal T2 FLAIR hyperintense signal abnormality within the cerebral white matter, nonspecific but compatible with moderate chronic small vessel ischemic disease. Chronic microhemorrhage within the right corona radiata/basal ganglia. There is no acute infarct. No evidence of an intracranial mass. No extra-axial fluid collection. No midline shift. No pathologic intracranial enhancement identified. Vascular: Maintained flow voids within the proximal large arterial vessels. Skull and upper cervical spine: No focal worrisome marrow lesion. Incompletely assessed cervical spondylosis. Sinuses/Orbits: No mass or acute finding within the imaged orbits. Prior bilateral ocular lens replacement. Minimal mucosal thickening within a right ethmoid air cell posteriorly. IMPRESSION: 1. No evidence of intracranial metastatic disease. 2. Moderate chronic small vessel ischemic changes within the cerebral white matter. 3. Mild generalized cerebral atrophy. Electronically Signed   By: Jackey Loge D.O.   On: 10/18/2023 07:57

## 2023-11-13 ENCOUNTER — Inpatient Hospital Stay: Payer: Medicare Other

## 2023-11-13 ENCOUNTER — Inpatient Hospital Stay (HOSPITAL_COMMUNITY): Payer: Medicare Other

## 2023-11-13 ENCOUNTER — Encounter: Payer: Self-pay | Admitting: Internal Medicine

## 2023-11-13 DIAGNOSIS — J910 Malignant pleural effusion: Secondary | ICD-10-CM | POA: Diagnosis not present

## 2023-11-13 DIAGNOSIS — E876 Hypokalemia: Secondary | ICD-10-CM | POA: Diagnosis not present

## 2023-11-13 DIAGNOSIS — C3491 Malignant neoplasm of unspecified part of right bronchus or lung: Secondary | ICD-10-CM | POA: Diagnosis not present

## 2023-11-13 DIAGNOSIS — D709 Neutropenia, unspecified: Secondary | ICD-10-CM | POA: Diagnosis not present

## 2023-11-13 HISTORY — PX: IR THORACENTESIS ASP PLEURAL SPACE W/IMG GUIDE: IMG5380

## 2023-11-13 LAB — BASIC METABOLIC PANEL
Anion gap: 11 (ref 5–15)
BUN: 5 mg/dL — ABNORMAL LOW (ref 8–23)
CO2: 27 mmol/L (ref 22–32)
Calcium: 7.6 mg/dL — ABNORMAL LOW (ref 8.9–10.3)
Chloride: 94 mmol/L — ABNORMAL LOW (ref 98–111)
Creatinine, Ser: 0.69 mg/dL (ref 0.44–1.00)
GFR, Estimated: >60 mL/min (ref >60)
Glucose, Bld: 93 mg/dL (ref 70–99)
Potassium: 3.3 mmol/L — ABNORMAL LOW (ref 3.5–5.1)
Sodium: 132 mmol/L — ABNORMAL LOW (ref 135–145)

## 2023-11-13 LAB — CBC WITH DIFFERENTIAL/PLATELET
Abs Immature Granulocytes: 0.01 10*3/uL (ref 0.00–0.07)
Basophils Absolute: 0 10*3/uL (ref 0.0–0.1)
Basophils Relative: 0 %
Eosinophils Absolute: 0 10*3/uL (ref 0.0–0.5)
Eosinophils Relative: 0 %
HCT: 24 % — ABNORMAL LOW (ref 36.0–46.0)
Hemoglobin: 8 g/dL — ABNORMAL LOW (ref 12.0–15.0)
Immature Granulocytes: 0 %
Lymphocytes Relative: 29 %
Lymphs Abs: 0.7 10*3/uL (ref 0.7–4.0)
MCH: 27.1 pg (ref 26.0–34.0)
MCHC: 33.3 g/dL (ref 30.0–36.0)
MCV: 81.4 fL (ref 80.0–100.0)
Monocytes Absolute: 0.5 10*3/uL (ref 0.1–1.0)
Monocytes Relative: 20 %
Neutro Abs: 1.2 10*3/uL — ABNORMAL LOW (ref 1.7–7.7)
Neutrophils Relative %: 51 %
Platelets: 307 10*3/uL (ref 150–400)
RBC: 2.95 MIL/uL — ABNORMAL LOW (ref 3.87–5.11)
RDW: 15.9 % — ABNORMAL HIGH (ref 11.5–15.5)
WBC: 2.4 10*3/uL — ABNORMAL LOW (ref 4.0–10.5)
nRBC: 0 % (ref 0.0–0.2)

## 2023-11-13 LAB — BODY FLUID CELL COUNT WITH DIFFERENTIAL
Eos, Fluid: 0 %
Lymphs, Fluid: 72 %
Monocyte-Macrophage-Serous Fluid: 27 % — ABNORMAL LOW (ref 50–90)
Neutrophil Count, Fluid: 1 % (ref 0–25)
Total Nucleated Cell Count, Fluid: 24 uL (ref 0–1000)

## 2023-11-13 LAB — GLUCOSE, PLEURAL OR PERITONEAL FLUID: Glucose, Fluid: 88 mg/dL

## 2023-11-13 LAB — GRAM STAIN

## 2023-11-13 LAB — PROTEIN, PLEURAL OR PERITONEAL FLUID: Total protein, fluid: <3 g/dL

## 2023-11-13 MED ORDER — POTASSIUM CHLORIDE CRYS ER 20 MEQ PO TBCR
40.0000 meq | EXTENDED_RELEASE_TABLET | Freq: Once | ORAL | Status: AC
  Administered 2023-11-13: 40 meq via ORAL
  Filled 2023-11-13: qty 2

## 2023-11-13 MED ORDER — LIDOCAINE HCL 1 % IJ SOLN
INTRAMUSCULAR | Status: AC
Start: 2023-11-13 — End: ?
  Filled 2023-11-13: qty 20

## 2023-11-13 MED ORDER — PIPERACILLIN-TAZOBACTAM 3.375 G IVPB
3.3750 g | Freq: Three times a day (TID) | INTRAVENOUS | Status: DC
  Administered 2023-11-13 – 2023-11-19 (×18): 3.375 g via INTRAVENOUS
  Filled 2023-11-13 (×18): qty 50

## 2023-11-13 NOTE — Plan of Care (Signed)

## 2023-11-13 NOTE — Progress Notes (Signed)
 Physical Therapy Treatment Patient Details Name: Amanda Potter MRN: [id number] DOB: 28-Dec-1945 Today's Date: 11/13/2023   History of Present Illness Pt is a 78 y/o F admitted on 11/11/23 after presenting for evaluation of a fever. Pt found to be neutropenic. 2/18 Rt thoracentesis  PMH: recently diagnosed stage IV lung adenocarcinoma with malignant R pleural effusion, hyperthyroidism, HLD, GERD    PT Comments  Patient agreeable to attempt OOB and walking. HR increased from 106 at rest to 132 with ambulation, therefore limited walk to bathroom and back. Sats at lowest 88% on room air. Pt very distracted by the pain from recent thoracentesis. Overall steady, however does reach to hold onto furniture, door, etc as she is accustomed to doing at home with her rails.     If plan is discharge home, recommend the following: A little help with walking and/or transfers;A little help with bathing/dressing/bathroom;Assistance with cooking/housework;Assist for transportation;Help with stairs or ramp for entrance   Can travel by private vehicle        Equipment Recommendations  None recommended by PT    Recommendations for Other Services       Precautions / Restrictions Precautions Precautions: Fall Precaution/Restrictions Comments: monitor O2 and HR Restrictions Weight Bearing Restrictions Per Provider Order: No     Mobility  Bed Mobility Overal bed mobility: Needs Assistance Bed Mobility: Supine to Sit     Supine to sit: Min assist, HOB elevated, Used rails     General bed mobility comments: increased time/effort, assistance with raising torso from supine; attempted to educate on rolling and side to sit but pt insisted on coming straight up    Transfers Overall transfer level: Needs assistance Equipment used: None Transfers: Sit to/from Stand Sit to Stand: Contact guard assist           General transfer comment: Guarding for safety due to grogginess     Ambulation/Gait Ambulation/Gait assistance: Contact guard assist Gait Distance (Feet): 10 Feet (toileted, 10 ft) Assistive device: None Gait Pattern/deviations: Decreased step length - right, Decreased step length - left, Decreased stride length, Decreased dorsiflexion - right, Decreased dorsiflexion - left, Trunk flexed, Shuffle Gait velocity: decreased     General Gait Details: reaching out to hold onto counter, doorknob, etc as she does at home with rails everywhere   Stairs             Wheelchair Mobility     Tilt Bed    Modified Rankin (Stroke Patients Only)       Balance Overall balance assessment: Needs assistance Sitting-balance support: No upper extremity supported, Feet supported Sitting balance-Leahy Scale: Good Sitting balance - Comments: doff/don sock sitting EOB   Standing balance support: No upper extremity supported, During functional activity Standing balance-Leahy Scale: Fair Standing balance comment: able to stand unsupported, no LOB                            Communication Communication Communication: No apparent difficulties  Cognition Arousal: Alert (a bit groggy; awakened on entry) Behavior During Therapy: WFL for tasks assessed/performed   PT - Cognitive impairments: No apparent impairments                         Following commands: Intact      Cueing Cueing Techniques: Verbal cues  Exercises      General Comments General comments (skin integrity, edema, etc.): HR 106 to 132 bpm; sats as low  as 88% on room air      Pertinent Vitals/Pain Pain Assessment Pain Assessment: Faces Faces Pain Scale: Hurts whole lot Pain Location: under Rt breast at site of recent thoracentesis Pain Descriptors / Indicators: Sharp Pain Intervention(s): Limited activity within patient's tolerance, Monitored during session    Home Living Family/patient expects to be discharged to:: Private residence Living Arrangements:  Children Available Help at Discharge: Available 24 hours/day;Friend(s);Family Type of Home: House Home Access: Ramped entrance       Home Layout: One level Home Equipment: Shower seat;Grab bars - toilet;Cane - quad;Cane - single point;Lift chair;Wheelchair - Forensic psychologist (2 wheels);BSC/3in1 Additional Comments: Pt reports she has rails mounted on the walls of her house as her house was originally built to be a nursing home.    Prior Function            PT Goals (current goals can now be found in the care plan section) Acute Rehab PT Goals Patient Stated Goal: not be so tired, get better Time For Goal Achievement: 11/26/23 Potential to Achieve Goals: Good Progress towards PT goals: Progressing toward goals    Frequency    Min 1X/week      PT Plan      Co-evaluation              AM-PAC PT "6 Clicks" Mobility   Outcome Measure  Help needed turning from your back to your side while in a flat bed without using bedrails?: A Little Help needed moving from lying on your back to sitting on the side of a flat bed without using bedrails?: A Little Help needed moving to and from a bed to a chair (including a wheelchair)?: A Little Help needed standing up from a chair using your arms (e.g., wheelchair or bedside chair)?: A Little Help needed to walk in hospital room?: A Little Help needed climbing 3-5 steps with a railing? : A Lot 6 Click Score: 17    End of Session   Activity Tolerance: Patient limited by fatigue Patient left: in bed;with call bell/phone within reach (RN ok with leaving alarm off) Nurse Communication: Mobility status;Other (comment) (pt sitting EOB per her preference) PT Visit Diagnosis: Muscle weakness (generalized) (M62.81);Other abnormalities of gait and mobility (R26.89);Unsteadiness on feet (R26.81)     Time: 4696-2952 PT Time Calculation (min) (ACUTE ONLY): 21 min  Charges:    $Gait Training: 8-22 mins PT General Charges $$ ACUTE  PT VISIT: 1 Visit                      Jerolyn Center, PT Acute Rehabilitation Services  Office [phone]    Amanda Potter 11/13/2023, 4:02 PM

## 2023-11-13 NOTE — Progress Notes (Addendum)
 [facility] PROGRESS NOTE   Amanda Potter JWJ:[id number] DOB: 1946/03/05 DOA: 11/11/2023  PCP: Richmond Campbell., PA-C  Brief History: 78 y.o. 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  Patient was noted to be neutropenic.  She was hospitalized for further management.  Consultants: Medical oncology.   Procedures: None yet    Subjective/Interval History: Patient mentions that she is feeling better.  More stronger than yesterday.  Did have some chest pain overnight which has resolved.  Denies any nausea or vomiting.      Assessment/Plan:  Neutropenic fever Absolute neutrophil counts were less than 1000.  This is in the setting of recent chemotherapy. Had fever of 101 F. Seen by medical oncology.  Neutrophil counts have improved.  No clear role for marrow stimulating agents. Continue cefepime.  Follow-up on cultures.  Hypotension Improved with IV hydration.  Monitor closely.  Stage IV lung adenocarcinoma with malignant right pleural effusion Followed by Dr. Arbutus Ped.  She is status post 4 cycles of treatment with Keytruda, Alimta and carboplatin on 2//25. Chest imaging study findings were similar to before.  Symptoms are not much worse than before.   It was felt that her x-ray findings were probably not responsible for her fever.   However CT angiogram was done yesterday evening as patient was complaining of stabbing chest pain which raised concern for loculated effusion and concern for empyema.  In view of this we will discuss with pulmonology if any intervention needs to be performed.  Of note she was seen by pulmonology recently on 2/13.   Clinically she seems to be improving.  Not requiring any oxygen. Discussed with Dr. Ardeth Perfect with pulmonology  who reviewed CT scan as well as previous imaging studies.  He says that there is a consolidation which could be reflective of pneumonia.  He recommends broadening coverage to Zosyn.  He also recommends thoracentesis to see if the effusion looks infected or not.  Ultrasound-guided thoracentesis has been ordered.  Hypokalemia and hypomagnesemia/hyponatremia Continue to supplement.    Normocytic anemia No evidence of overt bleeding.  Monitor hemoglobin on a daily basis.  History of hyperthyroidism Continue with methimazole.  Hyperlipidemia Continue statin.  History of GERD Continue PPI.   DVT Prophylaxis: Lovenox Code Status: Full code Family Communication: Discussed with patient Disposition Plan: Hopefully home when improved.  PT and OT evaluation.  Anticipate discharge in 24 to 48 hours.  Status is: Inpatient Remains inpatient appropriate because: Neutropenic fever      Medications: Scheduled:  enoxaparin (LOVENOX) injection  40 mg Subcutaneous Q24H   methimazole  20 mg Oral Daily   pantoprazole  40 mg Oral Daily   potassium chloride  40 mEq Oral Once   simvastatin  20 mg Oral QHS   Continuous:  ceFEPime (MAXIPIME) IV 2 g (11/13/23 0552)   AOZ:HYQMVHQIONGEX **OR** acetaminophen, albuterol, ondansetron **OR** ondansetron (ZOFRAN) IV, senna-docusate  Antibiotics: Anti-infectives (From admission, onward)    Start     Dose/Rate Route Frequency Ordered Stop   11/12/23 0500  ceFEPIme (MAXIPIME) 2 g in sodium chloride 0.9 % 100 mL IVPB        2 g 200 mL/hr over 30 Minutes Intravenous Every 12 hours 11/11/23 2041     11/11/23 1615  ceFEPIme (MAXIPIME) 2 g in sodium chloride 0.9 % 100 mL IVPB  2 g 200 mL/hr over 30 Minutes Intravenous  Once 11/11/23 1607 11/11/23 1752   11/11/23 1615  metroNIDAZOLE (FLAGYL) IVPB 500 mg        500 mg 100 mL/hr over 60 Minutes Intravenous  Once 11/11/23 1607 11/11/23 2042   11/11/23 1615  vancomycin (VANCOCIN) IVPB 1000 mg/200 mL  premix        1,000 mg 200 mL/hr over 60 Minutes Intravenous  Once 11/11/23 1607 11/11/23 1913       Objective:  Vital Signs  Vitals:   11/13/23 0036 11/13/23 0444 11/13/23 0800 11/13/23 0824  BP: (!) 105/50 (!) 104/59 (!) 119/57 111/63  Pulse: (!) 105 97 97 98  Resp: 16 17  18   Temp: 98.8 F (37.1 C) 99.1 F (37.3 C)  98.3 F (36.8 C)  TempSrc:  Oral  Oral  SpO2: 96% 96%  93%  Weight:      Height:        Intake/Output Summary (Last 24 hours) at 11/13/2023 1008 Last data filed at 11/13/2023 0816 Gross per 24 hour  Intake 1200 ml  Output --  Net 1200 ml   Filed Weights   11/11/23 1542  Weight: 70.6 kg    General appearance: Awake alert.  In no distress Resp: Diminished air entry at the right base with few crackles. Cardio: S1-S2 is normal regular.  No S3-S4.  No rubs murmurs or bruit GI: Abdomen is soft.  Nontender nondistended.  Bowel sounds are present normal.  No masses organomegaly Extremities: No edema.  Full range of motion of lower extremities. Neurologic: Alert and oriented x3.  No focal neurological deficits.    Lab Results:  Data Reviewed: I have personally reviewed following labs and reports of the imaging studies  CBC: Recent Labs  Lab 11/11/23 1550 11/12/23 0651 11/13/23 0624  WBC 2.2* 1.7*  1.7* 2.4*  NEUTROABS 0.9* 0.9* 1.2*  HGB 8.3* 8.1*  8.1* 8.0*  HCT 25.7* 24.6*  24.4* 24.0*  MCV 80.8 81.7  80.8 81.4  PLT 236 233  233 307    Basic Metabolic Panel: Recent Labs  Lab 11/11/23 1550 11/12/23 0651 11/13/23 0624  NA 130* 133* 132*  K 2.9* 3.5 3.3*  CL 94* 95* 94*  CO2 26 27 27   GLUCOSE 116* 110* 93  BUN 5* 5* 5*  CREATININE 0.76 0.71 0.69  CALCIUM 7.8* 7.8* 7.6*  MG 1.3* 1.7  --     GFR: Estimated Creatinine Clearance: 53.4 mL/min (by C-G formula based on SCr of 0.69 mg/dL).  Liver Function Tests: Recent Labs  Lab 11/11/23 1550  AST 14*  ALT 10  ALKPHOS 87  BILITOT 0.5  PROT 5.7*  ALBUMIN 1.8*       Coagulation Profile: Recent Labs  Lab 11/11/23 1550  INR 1.2     Recent Results (from the past 240 hours)  Culture, blood (Routine x 2)     Status: None (Preliminary result)   Collection Time: 11/11/23  3:42 PM   Specimen: BLOOD RIGHT HAND  Result Value Ref Range Status   Specimen Description BLOOD RIGHT HAND  Final   Special Requests   Final    BOTTLES DRAWN AEROBIC AND ANAEROBIC Blood Culture results may not be optimal due to an inadequate volume of blood received in culture bottles   Culture   Final    NO GROWTH 2 DAYS Performed at [facility], [address], [place], [place] [address]    Report Status PENDING  Incomplete  Culture, blood (Routine x 2)     Status: None (Preliminary result)   Collection Time: 11/11/23  3:47 PM   Specimen: BLOOD RIGHT FOREARM  Result Value Ref Range Status   Specimen Description BLOOD RIGHT FOREARM  Final   Special Requests   Final    BOTTLES DRAWN AEROBIC AND ANAEROBIC Blood Culture adequate volume   Culture   Final    NO GROWTH 2 DAYS Performed at [facility], [address] [place], [place] [address]    Report Status PENDING  Incomplete  Resp panel by RT-PCR (RSV, Flu A&B, Covid) Anterior Nasal Swab     Status: None   Collection Time: 11/11/23  4:06 PM   Specimen: Anterior Nasal Swab  Result Value Ref Range Status   SARS Coronavirus 2 by RT PCR NEGATIVE NEGATIVE Final   Influenza A by PCR NEGATIVE NEGATIVE Final   Influenza B by PCR NEGATIVE NEGATIVE Final    Comment: (NOTE) The Xpert Xpress SARS-CoV-2/FLU/RSV plus assay is intended as an aid in the diagnosis of influenza from Nasopharyngeal swab specimens and should not be used as a sole basis for treatment. Nasal washings and aspirates are unacceptable for Xpert Xpress SARS-CoV-2/FLU/RSV testing.  Fact Sheet for Patients: BloggerCourse.com  Fact Sheet for Healthcare Providers: SeriousBroker.it  This  test is not yet approved or cleared by the [place] FDA and has been authorized for detection and/or diagnosis of SARS-CoV-2 by FDA under an Emergency Use Authorization (EUA). This EUA will remain in effect (meaning this test can be used) for the duration of the COVID-19 declaration under Section 564(b)(1) of the Act, 21 U.S.C. section 360bbb-3(b)(1), unless the authorization is terminated or revoked.     Resp Syncytial Virus by PCR NEGATIVE NEGATIVE Final    Comment: (NOTE) Fact Sheet for Patients: BloggerCourse.com  Fact Sheet for Healthcare Providers: SeriousBroker.it  This test is not yet approved or cleared by the [place] FDA and has been authorized for detection and/or diagnosis of SARS-CoV-2 by FDA under an Emergency Use Authorization (EUA). This EUA will remain in effect (meaning this test can be used) for the duration of the COVID-19 declaration under Section 564(b)(1) of the Act, 21 U.S.C. section 360bbb-3(b)(1), unless the authorization is terminated or revoked.  Performed at [facility], [address], [place], [place] [address]       Radiology Studies: CT Angio Chest Pulmonary Embolism (PE) W or WO Contrast Result Date: 11/12/2023 CLINICAL DATA:  Positive for pulmonary embolism. Recently diagnosed stage IV lung cancer. EXAM: CT ANGIOGRAPHY CHEST WITH CONTRAST TECHNIQUE: Multidetector CT imaging of the chest was performed using the standard protocol during bolus administration of intravenous contrast. Multiplanar CT image reconstructions and MIPs were obtained to evaluate the vascular anatomy. RADIATION DOSE REDUCTION: This exam was performed according to the departmental dose-optimization program which includes automated exposure control, adjustment of the mA and/or kV according to patient size and/or use of iterative reconstruction technique. CONTRAST:  75mL OMNIPAQUE IOHEXOL 350 MG/ML SOLN COMPARISON:   CT dated 10/03/2023. FINDINGS: Cardiovascular: There is no cardiomegaly. Trace pericardial effusion. Three-vessel coronary vascular calcification. Mild atherosclerotic calcification of the thoracic aorta. No aneurysmal dilatation. The origins of the great vessels of the aortic arch appear patent. No pulmonary artery embolus identified. Mediastinum/Nodes: Bilateral hilar adenopathy measure up to 16 mm in short axis on the right. Subcarinal lymph nodes measure 11 mm short axis. There is moderate size hiatal hernia containing a portion of the stomach. The esophagus is  grossly unremarkable no mediastinal fluid collection. Lungs/Pleura: Near complete resolution of the previously seen right pneumothorax. Small right pleural effusion with loculated components in the anterior pleural space as well as a loculated component along the posterior right lung base. Small pocket of air within the posterior right lung base pleural effusion may be residual from prior pneumothorax. An infectious process or empyema is not excluded. Rounded consolidative change at the right lung base may represent rounded atelectasis. Pneumonia or underlying mass is not excluded. Trace left pleural effusion. Left lung base reticulonodular atelectasis. No pneumothorax. The central airways are patent. Upper Abdomen: Cholecystectomy. Musculoskeletal: Osteopenia with degenerative changes of the spine. No acute osseous pathology. Review of the MIP images confirms the above findings. IMPRESSION: 1. No CT evidence of pulmonary artery embolus. 2. Loculated right pleural effusions and right lung base rounded atelectasis. Pneumonia or underlying mass is not excluded. 3. Trace left pleural effusion. 4. Moderate size hiatal hernia. 5.  Aortic Atherosclerosis (ICD10-I70.0). Electronically Signed   By: Elgie Collard M.D.   On: 11/12/2023 20:26   DG Chest 1 View Result Date: 11/11/2023 CLINICAL DATA:  [id number] Sepsis (HCC) [id number]. Fever. Nausea. Fatigue. EXAM:  CHEST  1 VIEW COMPARISON:  10/04/2023. FINDINGS: Low lung volume. Redemonstration of heterogeneous opacity overlying the right lower lateral hemithorax which corresponds to loculated hydropneumothorax on the prior chest CT scan from 10/03/2023. There is also diffuse homogeneous opacification of right hemithorax in comparison to the left, which may be due to layering pleural effusion. Bilateral lung fields are otherwise clear. No acute dense consolidation or lung collapse. Left lateral costophrenic angle is clear. No pneumothorax on either side. Stable cardio-mediastinal silhouette. No acute osseous abnormalities. The soft tissues are within normal limits. IMPRESSION: *Redemonstration of heterogeneous opacity overlying the right lower lateral hemithorax which corresponds to loculated hydropneumothorax on the prior chest CT scan. *There is diffuse homogeneous opacification of right hemithorax in comparison to the left, which may be due to layering pleural effusion. Electronically Signed   By: Jules Schick M.D.   On: 11/11/2023 17:01       LOS: 2 days   [place] Nishi Rito Ehrlich  [facility] Pager on [email or web address]  11/13/2023, 10:08 AM

## 2023-11-13 NOTE — Procedures (Signed)
 PROCEDURE SUMMARY:  Successful US guided right thoracentesis. Yielded 350 mL of clear yellow fluid. Patient tolerated procedure well. No immediate complications. EBL = trace  Specimen was sent for labs.  Post procedure chest X-ray pending.  Cohl Behrens S Avyon Herendeen PA-C 11/13/2023 1:28 PM

## 2023-11-13 NOTE — Progress Notes (Signed)
 PT Cancellation Note  Patient Details Name: Amanda Potter MRN: [id number] DOB: 03-27-1946   Cancelled Treatment:    Reason Eval/Treat Not Completed: Patient at procedure or test/unavailable  Patient off the unit. Will continue attempts.    Jerolyn Center, PT Acute Rehabilitation Services  Office [phone]  Zena Amos 11/13/2023, 12:32 PM

## 2023-11-13 NOTE — Progress Notes (Signed)
   11/12/23 2049  Vitals  Temp 98.4 F (36.9 C)  Temp Source Oral  BP 108/63  MAP (mmHg) 76  BP Location Left Arm  BP Method Automatic  Patient Position (if appropriate) Lying  Pulse Rate (!) 103  Resp 18  MEWS COLOR  MEWS Score Color Green  Oxygen Therapy  SpO2 96 %  O2 Device Room Air  MEWS Score  MEWS Temp 0  MEWS Systolic 0  MEWS Pulse 1  MEWS RR 0  MEWS LOC 0  MEWS Score 1   Pt A&O x 4. Vss, on room air. Denies pain. Sinus tachy on the tele monitor. Pt asymptomatic. Educated on falls and safety precautions, call bell, plan of care, pt verbalizes understanding. Safety maintained. Bed alarm on. Call bell in reach. Will continue to monitor.

## 2023-11-13 NOTE — Progress Notes (Signed)
 PT Cancellation Note  Patient Details Name: Amanda Potter MRN: [id number] DOB: [birth year]   Cancelled Treatment:    Reason Eval/Treat Not Completed: Patient declined, no reason specified  Patient s/p thoracentesis and does not want to move due to ?fear of pain. She denied pain and was unclear on reason she was not willing to move. Requested PT return later in day if available.    Jerolyn Center, PT Acute Rehabilitation Services  Office [phone]   Zena Amos 11/13/2023, 2:16 PM

## 2023-11-13 NOTE — Evaluation (Signed)
 Occupational Therapy Evaluation Patient Details Name: Amanda Potter MRN: [id number] DOB: [birth year] Today's Date: 11/13/2023   History of Present Illness   Pt is a 78 y/o F admitted on 11/11/23 after presenting for evaluation of a fever. Pt found to be neutropenic. PMH: recently diagnosed stage IV lung adenocarcinoma with malignant R pleural effusion, hyperthyroidism, HLD, GERD     Clinical Impressions Pt c/o discomfort and fear of pain with sitting up from incision wound, no current pain. Pt daughter present during session. Pt states PLOF independent, lives with son, ramped entrance, states house was built to be a nursing home has railings down wide hallways. Pt currently close to baseline, limited due to fatigue and extra effort to perform OOB activities. Pt able to ambulate with supervision for safety using walls/sink for support, no LOB. Pt able to complete ADLs with set up. Pt will be seen acutely to maximize activity tolerance, no OT follow up needed. Pt states she has all DME needs.      If plan is discharge home, recommend the following:   A little help with walking and/or transfers;A little help with bathing/dressing/bathroom;Assistance with cooking/housework;Help with stairs or ramp for entrance;Assist for transportation     Functional Status Assessment   Patient has had a recent decline in their functional status and demonstrates the ability to make significant improvements in function in a reasonable and predictable amount of time.     Equipment Recommendations   None recommended by OT     Recommendations for Other Services         Precautions/Restrictions   Precautions Precautions: Fall Restrictions Weight Bearing Restrictions Per Provider Order: No     Mobility Bed Mobility Overal bed mobility: Needs Assistance Bed Mobility: Supine to Sit, Sit to Supine     Supine to sit: Min assist, HOB elevated Sit to supine: Supervision, HOB elevated, Used  rails   General bed mobility comments: increased time/effort, assistance with sitting up from flat surface    Transfers Overall transfer level: Needs assistance Equipment used: None Transfers: Sit to/from Stand, Bed to chair/wheelchair/BSC Sit to Stand: Supervision     Step pivot transfers: Supervision     General transfer comment: supervision for safety, Pt holds onto sink/walls for maintaining balance, no LOB      Balance Overall balance assessment: Needs assistance Sitting-balance support: No upper extremity supported, Feet supported Sitting balance-Leahy Scale: Fair     Standing balance support: No upper extremity supported, During functional activity Standing balance-Leahy Scale: Fair Standing balance comment: able to stand unsupported, no LOB                           ADL either performed or assessed with clinical judgement   ADL Overall ADL's : Needs assistance/impaired                                       General ADL Comments: set up/supervision for ADLs and transfers without AD, doing well     Vision Baseline Vision/History: 1 Wears glasses Ability to See in Adequate Light: 0 Adequate Patient Visual Report: No change from baseline       Perception         Praxis         Pertinent Vitals/Pain Pain Assessment Pain Assessment: No/denies pain     Extremity/Trunk Assessment Upper Extremity Assessment Upper Extremity Assessment:  Overall [facility] for tasks assessed   Lower Extremity Assessment Lower Extremity Assessment: Defer to PT evaluation       Communication Communication Communication: No apparent difficulties   Cognition Arousal: Alert Behavior During Therapy: WFL for tasks assessed/performed Cognition: No apparent impairments                               Following commands: Intact       Cueing  General Comments          Exercises     Shoulder Instructions      Home Living  Family/patient expects to be discharged to:: Private residence Living Arrangements: Children Available Help at Discharge: Available 24 hours/day;Friend(s);Family Type of Home: House Home Access: Ramped entrance     Home Layout: One level     Bathroom Shower/Tub: Producer, television/film/video: Handicapped height     Home Equipment: Shower seat;Grab bars - toilet;Cane - quad;Cane - single point;Lift chair;Wheelchair - Forensic psychologist (2 wheels);BSC/3in1   Additional Comments: Pt reports she has rails mounted on the walls of her house as her house was originally built to be a nursing home.      Prior Functioning/Environment Prior Level of Function : Independent/Modified Independent;Driving             Mobility Comments: Pt reports since last admission she was ambulating without assistance, holding to rails in hallway. ADLs Comments: Pt states that she was still driving. Performs all ADL's and IADL's including finances and medications.    OT Problem List: Decreased strength;Decreased activity tolerance;Impaired balance (sitting and/or standing)   OT Treatment/Interventions: Self-care/ADL training;Therapeutic exercise;Energy conservation;DME and/or AE instruction;Therapeutic activities;Patient/family education      OT Goals(Current goals can be found in the care plan section)   Acute Rehab OT Goals Patient Stated Goal: to improve activity tolerance OT Goal Formulation: With patient/family Time For Goal Achievement: 11/27/23 Potential to Achieve Goals: Good   OT Frequency:  Min 1X/week    Co-evaluation              AM-PAC OT "6 Clicks" Daily Activity     Outcome Measure Help from another person eating meals?: None Help from another person taking care of personal grooming?: A Little Help from another person toileting, which includes using toliet, bedpan, or urinal?: A Little Help from another person bathing (including washing, rinsing, drying)?: A  Little Help from another person to put on and taking off regular upper body clothing?: A Little Help from another person to put on and taking off regular lower body clothing?: A Little 6 Click Score: 19   End of Session Nurse Communication: Mobility status  Activity Tolerance: Patient limited by fatigue Patient left: in bed;with call bell/phone within reach  OT Visit Diagnosis: Unsteadiness on feet (R26.81);Muscle weakness (generalized) (M62.81)                Time: 0981-1914 OT Time Calculation (min): 23 min Charges:  OT General Charges $OT Visit: 1 Visit OT Evaluation $OT Eval Low Complexity: 1 Low OT Treatments $Self Care/Home Management : 8-22 mins  [place], OTR/L   Amanda Potter 11/13/2023, 3:43 PM

## 2023-11-13 NOTE — Progress Notes (Signed)
 PHARMACY ANTIBIOTIC CONSULT NOTE   Amanda Potter a 78 y.o. female with Stage IV adenocarcinoma. Reported a 101 degree fever PTA, not febrile this admit, ANC improved to 1200 . Imaging reflects possible empyema and pulmonology has recommended broadening antibiotics to Zosyn and performing a thoracentesis to r/o infectious process.   Pharmacy has been consulted for Zosyn dosing at recommendation for pulm.  Scr 0.69 (11/13/2023), WBC 2.4 (11/13/2023)  Estimated Creatinine Clearance: 53.4 mL/min (by C-G formula based on SCr of 0.69 mg/dL).  Plan: START Zosyn 3.375 g IV Q8H (EI)  F/U thora, renal function, ANC, fever curve, abx DOT  Allergies:  Allergies  Allergen Reactions   Latex Rash   Tape Rash    Glue on tape    Filed Weights   11/11/23 1542  Weight: 70.6 kg (155 lb 10.3 oz)       Latest Ref Rng & Units 11/13/2023    6:24 AM 11/12/2023    6:51 AM 11/11/2023    3:50 PM  CBC  WBC 4.0 - 10.5 K/uL 2.4  1.7    1.7  2.2   Hemoglobin 12.0 - 15.0 g/dL 8.0  8.1    8.1  8.3   Hematocrit 36.0 - 46.0 % 24.0  24.4    24.6  25.7   Platelets 150 - 400 K/uL 307  233    233  236     Antibiotics Given (last 72 hours)     Date/Time Action Medication Dose Rate   11/11/23 1701 New Bag/Given   ceFEPIme (MAXIPIME) 2 g in sodium chloride 0.9 % 100 mL IVPB 2 g 200 mL/hr   11/11/23 1755 New Bag/Given   vancomycin (VANCOCIN) IVPB 1000 mg/200 mL premix 1,000 mg 200 mL/hr   11/11/23 1913 New Bag/Given   metroNIDAZOLE (FLAGYL) IVPB 500 mg 500 mg 100 mL/hr   11/12/23 0445 New Bag/Given   ceFEPIme (MAXIPIME) 2 g in sodium chloride 0.9 % 100 mL IVPB 2 g 200 mL/hr   11/12/23 1628 New Bag/Given   ceFEPIme (MAXIPIME) 2 g in sodium chloride 0.9 % 100 mL IVPB 2 g 200 mL/hr   11/13/23 9528 New Bag/Given   ceFEPIme (MAXIPIME) 2 g in sodium chloride 0.9 % 100 mL IVPB 2 g 200 mL/hr       Bcx 2/16 NGTD   CFP 2/16>> 2/18 Vanc/MTZ x1 2/16 Zosyn 2/18>> c   Thank you for allowing pharmacy to be a part  of this patient's care.  Jani Gravel, PharmD Clinical Pharmacist  11/13/2023 10:44 AM

## 2023-11-14 ENCOUNTER — Ambulatory Visit (HOSPITAL_COMMUNITY): Payer: MEDICARE

## 2023-11-14 ENCOUNTER — Other Ambulatory Visit (HOSPITAL_COMMUNITY): Payer: MEDICARE

## 2023-11-14 DIAGNOSIS — R5081 Fever presenting with conditions classified elsewhere: Secondary | ICD-10-CM | POA: Diagnosis not present

## 2023-11-14 DIAGNOSIS — D709 Neutropenia, unspecified: Secondary | ICD-10-CM | POA: Diagnosis not present

## 2023-11-14 LAB — CBC WITH DIFFERENTIAL/PLATELET
Abs Immature Granulocytes: 0.03 10*3/uL (ref 0.00–0.07)
Basophils Absolute: 0 10*3/uL (ref 0.0–0.1)
Basophils Relative: 1 %
Eosinophils Absolute: 0 10*3/uL (ref 0.0–0.5)
Eosinophils Relative: 1 %
HCT: 23.3 % — ABNORMAL LOW (ref 36.0–46.0)
Hemoglobin: 7.7 g/dL — ABNORMAL LOW (ref 12.0–15.0)
Immature Granulocytes: 1 %
Lymphocytes Relative: 31 %
Lymphs Abs: 0.9 10*3/uL (ref 0.7–4.0)
MCH: 26.6 pg (ref 26.0–34.0)
MCHC: 33 g/dL (ref 30.0–36.0)
MCV: 80.6 fL (ref 80.0–100.0)
Monocytes Absolute: 0.7 10*3/uL (ref 0.1–1.0)
Monocytes Relative: 24 %
Neutro Abs: 1.2 10*3/uL — ABNORMAL LOW (ref 1.7–7.7)
Neutrophils Relative %: 42 %
Platelets: 414 10*3/uL — ABNORMAL HIGH (ref 150–400)
RBC: 2.89 MIL/uL — ABNORMAL LOW (ref 3.87–5.11)
RDW: 15.9 % — ABNORMAL HIGH (ref 11.5–15.5)
WBC: 2.8 10*3/uL — ABNORMAL LOW (ref 4.0–10.5)
nRBC: 0 % (ref 0.0–0.2)

## 2023-11-14 LAB — BASIC METABOLIC PANEL
Anion gap: 10 (ref 5–15)
BUN: 5 mg/dL — ABNORMAL LOW (ref 8–23)
CO2: 27 mmol/L (ref 22–32)
Calcium: 7.8 mg/dL — ABNORMAL LOW (ref 8.9–10.3)
Chloride: 95 mmol/L — ABNORMAL LOW (ref 98–111)
Creatinine, Ser: 1.05 mg/dL — ABNORMAL HIGH (ref 0.44–1.00)
GFR, Estimated: 54 mL/min — ABNORMAL LOW (ref >60)
Glucose, Bld: 104 mg/dL — ABNORMAL HIGH (ref 70–99)
Potassium: 3.3 mmol/L — ABNORMAL LOW (ref 3.5–5.1)
Sodium: 132 mmol/L — ABNORMAL LOW (ref 135–145)

## 2023-11-14 LAB — LACTATE DEHYDROGENASE: LDH: 138 U/L (ref 98–192)

## 2023-11-14 LAB — GLUCOSE, CAPILLARY: Glucose-Capillary: 146 mg/dL — ABNORMAL HIGH (ref 70–99)

## 2023-11-14 LAB — MAGNESIUM: Magnesium: 1.2 mg/dL — ABNORMAL LOW (ref 1.7–2.4)

## 2023-11-14 LAB — PATHOLOGIST SMEAR REVIEW

## 2023-11-14 MED ORDER — POTASSIUM CHLORIDE CRYS ER 20 MEQ PO TBCR
40.0000 meq | EXTENDED_RELEASE_TABLET | Freq: Once | ORAL | Status: AC
  Administered 2023-11-14: 40 meq via ORAL
  Filled 2023-11-14: qty 2

## 2023-11-14 MED ORDER — MAGNESIUM SULFATE 4 GM/100ML IV SOLN
4.0000 g | Freq: Once | INTRAVENOUS | Status: AC
  Administered 2023-11-14: 4 g via INTRAVENOUS
  Filled 2023-11-14: qty 100

## 2023-11-14 NOTE — Progress Notes (Signed)
 Mobility Specialist: Progress Note   11/14/23 1205  Mobility  Activity Ambulated with assistance in hallway  Level of Assistance Contact guard assist, steadying assist  Assistive Device None;Other (Comment) (furniture surfing +  hallway handrails)  Distance Ambulated (ft) 50 ft  Activity Response Tolerated well  Mobility Referral Yes  Mobility visit 1 Mobility  Mobility Specialist Start Time (ACUTE ONLY) [id number]  Mobility Specialist Stop Time (ACUTE ONLY) 0846  Mobility Specialist Time Calculation (min) (ACUTE ONLY) 11 min    During Mobility: HR 131, SpO2 92% RA  Pt was agreeable to mobility session - received in bed. SV for bed mobility, CG for ambulation. No AD but continuously reaching for UE support. Distance limited by pt d/t pt feeling lightheaded, also audibly SOB. Returned to room without fault. HR 131 and SpO2 WFL on RA. Left on EOB with all needs met, call bell in reach.   Maurene Capes Mobility Specialist Please contact via SecureChat or Rehab office at [phone]

## 2023-11-14 NOTE — Progress Notes (Signed)
  Progress Note   Patient: Amanda Potter ZOX:[id number] DOB: 03-20-1946 DOA: 11/11/2023     3 DOS: the patient was seen and examined on 11/14/2023   Brief hospital course: Amanda Potter is a 78 y.o. 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  Assessment and Plan: Neutropenic fever  - IV zosyn 3.375 g q8hr   Hypotension  - Improving   Stage IV lung adenocarcinoma w/ malignant R pleural effusion  - Outpt follow up with Dr. Arbutus Ped   Hyperthyroidism  - Methimazole 20 mg PO daily   HLD  - Zocor 20 mg PO at bedtime   GERD  - Protonix 40 mg PO daily   Subjective: Pt seen and examined at the bedside. She reports she is still feeling weak. WBC is marginally improved at 2.8. Continue with supportive care and IV antibx's.  Physical Exam: Vitals:   11/14/23 0436 11/14/23 0532 11/14/23 0822 11/14/23 1222  BP: 102/60 (!) 104/59 94/61 (!) 105/46  Pulse: 94 88 (!) 109 91  Resp: 18 16 18 16   Temp: 98.2 F (36.8 C) 98 F (36.7 C) 98.2 F (36.8 C) 97.9 F (36.6 C)  TempSrc: Oral Oral Oral Oral  SpO2: 95% 96% 94% 97%  Weight:      Height:       Physical Exam HENT:     Head: Normocephalic.     Mouth/Throat:     Mouth: Mucous membranes are moist.  Cardiovascular:     Rate and Rhythm: Normal rate and regular rhythm.  Pulmonary:     Effort: Pulmonary effort is normal.  Abdominal:     Palpations: Abdomen is soft.  Musculoskeletal:        General: Normal range of motion.     Cervical back: Neck supple.  Skin:    General: Skin is warm.  Neurological:     Mental Status: She is alert. Mental status is at baseline.  Psychiatric:        Mood and Affect: Mood normal.      Disposition: Status is: Inpatient Remains inpatient appropriate because: IV antibx  Planned  Discharge Destination:  Dispo per pt's clinical progress     Time spent: 35 minutes  Author: Baron Hamper , MD 11/14/2023 2:09 PM  For on call review [email or web address].

## 2023-11-14 NOTE — Plan of Care (Signed)

## 2023-11-14 NOTE — Plan of Care (Signed)
 Pt is A&O x 4. VSS, on room air. Sinus rhythm on the tele monitor. Pt asymptomatic. Ambulates to bathroom with stand by assist. Educated on falls and safety precautions, call bell and plan of care. Pt verbalizes understanding. Safety maintained. Call bell in reach. Will continue to monitor.   Problem: Education: Goal: Knowledge of General Education information will improve Description: Including pain rating scale, medication(s)/side effects and non-pharmacologic comfort measures Outcome: Progressing   Problem: Health Behavior/Discharge Planning: Goal: Ability to manage health-related needs will improve Outcome: Progressing   Problem: Clinical Measurements: Goal: Ability to maintain clinical measurements within normal limits will improve Outcome: Progressing Goal: Will remain free from infection Outcome: Progressing Goal: Diagnostic test results will improve Outcome: Progressing Goal: Respiratory complications will improve Outcome: Progressing Goal: Cardiovascular complication will be avoided Outcome: Progressing   Problem: Activity: Goal: Risk for activity intolerance will decrease Outcome: Progressing   Problem: Nutrition: Goal: Adequate nutrition will be maintained Outcome: Progressing   Problem: Coping: Goal: Level of anxiety will decrease Outcome: Progressing   Problem: Elimination: Goal: Will not experience complications related to bowel motility Outcome: Progressing Goal: Will not experience complications related to urinary retention Outcome: Progressing   Problem: Pain Managment: Goal: General experience of comfort will improve and/or be controlled Outcome: Progressing   Problem: Safety: Goal: Ability to remain free from injury will improve Outcome: Progressing   Problem: Skin Integrity: Goal: Risk for impaired skin integrity will decrease Outcome: Progressing

## 2023-11-15 DIAGNOSIS — R5081 Fever presenting with conditions classified elsewhere: Secondary | ICD-10-CM | POA: Diagnosis not present

## 2023-11-15 DIAGNOSIS — D709 Neutropenia, unspecified: Secondary | ICD-10-CM | POA: Diagnosis not present

## 2023-11-15 LAB — COMPREHENSIVE METABOLIC PANEL
ALT: 9 U/L (ref 0–44)
AST: 14 U/L — ABNORMAL LOW (ref 15–41)
Albumin: 1.6 g/dL — ABNORMAL LOW (ref 3.5–5.0)
Alkaline Phosphatase: 67 U/L (ref 38–126)
Anion gap: 9 (ref 5–15)
BUN: <5 mg/dL — ABNORMAL LOW (ref 8–23)
CO2: 28 mmol/L (ref 22–32)
Calcium: 7.9 mg/dL — ABNORMAL LOW (ref 8.9–10.3)
Chloride: 95 mmol/L — ABNORMAL LOW (ref 98–111)
Creatinine, Ser: 1.09 mg/dL — ABNORMAL HIGH (ref 0.44–1.00)
GFR, Estimated: 52 mL/min — ABNORMAL LOW (ref >60)
Glucose, Bld: 120 mg/dL — ABNORMAL HIGH (ref 70–99)
Potassium: 3.7 mmol/L (ref 3.5–5.1)
Sodium: 132 mmol/L — ABNORMAL LOW (ref 135–145)
Total Bilirubin: 0.4 mg/dL (ref 0.0–1.2)
Total Protein: 4.9 g/dL — ABNORMAL LOW (ref 6.5–8.1)

## 2023-11-15 LAB — CBC
HCT: 24 % — ABNORMAL LOW (ref 36.0–46.0)
Hemoglobin: 8.1 g/dL — ABNORMAL LOW (ref 12.0–15.0)
MCH: 27.2 pg (ref 26.0–34.0)
MCHC: 33.8 g/dL (ref 30.0–36.0)
MCV: 80.5 fL (ref 80.0–100.0)
Platelets: 591 10*3/uL — ABNORMAL HIGH (ref 150–400)
RBC: 2.98 MIL/uL — ABNORMAL LOW (ref 3.87–5.11)
RDW: 15.9 % — ABNORMAL HIGH (ref 11.5–15.5)
WBC: 3 10*3/uL — ABNORMAL LOW (ref 4.0–10.5)
nRBC: 0 % (ref 0.0–0.2)

## 2023-11-15 LAB — PHOSPHORUS: Phosphorus: 2.9 mg/dL (ref 2.5–4.6)

## 2023-11-15 LAB — MAGNESIUM: Magnesium: 2 mg/dL (ref 1.7–2.4)

## 2023-11-15 LAB — C-REACTIVE PROTEIN: CRP: 6 mg/dL — ABNORMAL HIGH (ref <1.0)

## 2023-11-15 MED ORDER — SODIUM CHLORIDE 0.9 % IV SOLN: INTRAVENOUS | Status: DC

## 2023-11-15 NOTE — Progress Notes (Signed)
  Progress Note   Patient: Amanda Potter ZOX:[id number] DOB: 11/01/1945 DOA: 11/11/2023     4 DOS: the patient was seen and examined on 11/15/2023   Brief hospital course: Amanda Potter is a 78 y.o. 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  Assessment and Plan: Neutropenic fever  - IV zosyn 3.375 g q8hr    Hypotension  - Improving    Stage IV lung adenocarcinoma w/ malignant R pleural effusion  - Outpt follow up with Dr. Arbutus Ped    Hyperthyroidism  - Methimazole 20 mg PO daily    HLD  - Zocor 20 mg PO at bedtime    GERD  - Protonix 40 mg PO daily    Subjective: Pt seen and examined at the bedside. Pt's labs are improving and her energy is also improving.  Physical Exam: Vitals:   11/14/23 2040 11/15/23 0008 11/15/23 0430 11/15/23 0801  BP: (!) 115/59 106/66 120/63 (!) 106/58  Pulse: 99 89 87 95  Resp: 19 19 19 18   Temp: 97.6 F (36.4 C) 98 F (36.7 C) 97.8 F (36.6 C) 98.1 F (36.7 C)  TempSrc:    Oral  SpO2: 93% 96% 95% 96%  Weight:      Height:       HENT:     Head: Normocephalic.     Mouth/Throat:     Mouth: Mucous membranes are moist.  Cardiovascular:     Rate and Rhythm: Normal rate and regular rhythm.  Pulmonary:     Effort: Pulmonary effort is normal.  Abdominal:     Palpations: Abdomen is soft.  Musculoskeletal:        General: Normal range of motion.     Cervical back: Neck supple.  Skin:    General: Skin is warm.  Neurological:     Mental Status: She is alert. Mental status is at baseline.  Psychiatric:        Mood and Affect: Mood normal.      Disposition: Status is: Inpatient Remains inpatient appropriate because: IV antibx and serial labs   Planned Discharge Destination:  Dispo per pt's clinical progress    Time  spent: 35 minutes  Author: Baron Hamper , MD 11/15/2023 11:58 AM  For on call review [email or web address].

## 2023-11-15 NOTE — Progress Notes (Signed)
 Mobility Specialist: Progress Note   11/15/23 1640  Mobility  Activity Ambulated with assistance in hallway  Level of Assistance Contact guard assist, steadying assist  Assistive Device Other (Comment) (IV pole)  Distance Ambulated (ft) 125 ft  Activity Response Tolerated well  Mobility Referral Yes  Mobility visit 1 Mobility  Mobility Specialist Start Time (ACUTE ONLY) 1521  Mobility Specialist Stop Time (ACUTE ONLY) 1536  Mobility Specialist Time Calculation (min) (ACUTE ONLY) 15 min    Pt was agreeable to mobility session - received in bed. C/o sinus pressure and irritation from allergies. Used IV pole for singular UE support with CG via gait belt. Returned to room without fault. Left on EOB with all needs met, call bell in reach.   Maurene Capes Mobility Specialist Please contact via SecureChat or Rehab office at [phone]

## 2023-11-15 NOTE — Plan of Care (Signed)

## 2023-11-15 NOTE — Plan of Care (Signed)

## 2023-11-15 NOTE — Progress Notes (Signed)
 Physical Therapy Treatment Patient Details Name: Amanda Potter MRN: [id number] DOB: [birth year] Today's Date: 11/15/2023   History of Present Illness Pt is a 78 y/o F admitted on 11/11/23 after presenting for evaluation of a fever. Pt found to be neutropenic. 2/18 Rt thoracentesis  PMH: recently diagnosed stage IV lung adenocarcinoma with malignant R pleural effusion, hyperthyroidism, HLD, GERD    PT Comments  Patient continues to use UE support via furniture in room and rail in hallway. Reports this is what she does at home (has rails throughout home) and does not want to use a rollator (which she already owns). Single loss of balance requiring min assist to prevent fall (in an area of hallway without rail). Emphasized rollator could help prevent falling. Pt will consider.     If plan is discharge home, recommend the following: A little help with walking and/or transfers;A little help with bathing/dressing/bathroom;Assistance with cooking/housework;Assist for transportation;Help with stairs or ramp for entrance   Can travel by private vehicle        Equipment Recommendations  None recommended by PT    Recommendations for Other Services       Precautions / Restrictions Precautions Precautions: Fall Restrictions Weight Bearing Restrictions Per Provider Order: No     Mobility  Bed Mobility Overal bed mobility: Modified Independent Bed Mobility: Supine to Sit     Supine to sit: Modified independent (Device/Increase time)     General bed mobility comments: increased time/effort    Transfers Overall transfer level: Needs assistance Equipment used: None Transfers: Sit to/from Stand Sit to Stand: Contact guard assist           General transfer comment: Guarding for safety    Ambulation/Gait Ambulation/Gait assistance: Contact guard assist Gait Distance (Feet): 150 Feet Assistive device:  (rail in hallway) Gait Pattern/deviations: Decreased step length - right,  Decreased step length - left, Decreased stride length, Decreased dorsiflexion - right, Decreased dorsiflexion - left, Trunk flexed, Shuffle Gait velocity: decreased     General Gait Details: reaching out to hold onto counter, doorknob, rails as she does at home with rails everywhere; one LOB requiring min assist to recover when not holding railiing   Optometrist     Tilt Bed    Modified Rankin (Stroke Patients Only)       Balance Overall balance assessment: Needs assistance Sitting-balance support: No upper extremity supported, Feet supported Sitting balance-Leahy Scale: Good Sitting balance - Comments: doff/don sock sitting EOB   Standing balance support: No upper extremity supported, During functional activity Standing balance-Leahy Scale: Fair Standing balance comment: able to stand unsupported, no LOB                            Communication Communication Communication: No apparent difficulties  Cognition Arousal: Alert (a bit groggy; awakened on entry) Behavior During Therapy: WFL for tasks assessed/performed   PT - Cognitive impairments: No apparent impairments                       PT - Cognition Comments: some decr safety awareness with refusing use of rollator Following commands: Intact      Cueing Cueing Techniques: Verbal cues  Exercises      General Comments        Pertinent Vitals/Pain Pain Assessment Pain Assessment: Faces Faces Pain Scale: Hurts a little bit Pain Location:  generalized Pain Descriptors / Indicators: Discomfort    Home Living                          Prior Function            PT Goals (current goals can now be found in the care plan section) Acute Rehab PT Goals Patient Stated Goal: not be so tired, get better Time For Goal Achievement: 11/26/23 Potential to Achieve Goals: Good Progress towards PT goals: Progressing toward goals    Frequency    Min  1X/week      PT Plan      Co-evaluation              AM-PAC PT "6 Clicks" Mobility   Outcome Measure  Help needed turning from your back to your side while in a flat bed without using bedrails?: A Little Help needed moving from lying on your back to sitting on the side of a flat bed without using bedrails?: A Little Help needed moving to and from a bed to a chair (including a wheelchair)?: A Little Help needed standing up from a chair using your arms (e.g., wheelchair or bedside chair)?: A Little Help needed to walk in hospital room?: A Little Help needed climbing 3-5 steps with a railing? : A Little 6 Click Score: 18    End of Session Equipment Utilized During Treatment: Gait belt Activity Tolerance: Patient tolerated treatment well Patient left: in bed;with call bell/phone within reach (RN ok with leaving alarm off)   PT Visit Diagnosis: Muscle weakness (generalized) (M62.81);Other abnormalities of gait and mobility (R26.89);Unsteadiness on feet (R26.81)     Time: 8295-6213 PT Time Calculation (min) (ACUTE ONLY): 13 min  Charges:    $Gait Training: 8-22 mins PT General Charges $$ ACUTE PT VISIT: 1 Visit                      Jerolyn Center, PT Acute Rehabilitation Services  Office [phone]    Zena Amos 11/15/2023, 9:37 AM

## 2023-11-16 ENCOUNTER — Encounter: Payer: Self-pay | Admitting: Internal Medicine

## 2023-11-16 DIAGNOSIS — R5081 Fever presenting with conditions classified elsewhere: Secondary | ICD-10-CM | POA: Diagnosis not present

## 2023-11-16 DIAGNOSIS — D709 Neutropenia, unspecified: Secondary | ICD-10-CM | POA: Diagnosis not present

## 2023-11-16 LAB — COMPREHENSIVE METABOLIC PANEL
ALT: 11 U/L (ref 0–44)
AST: 18 U/L (ref 15–41)
Albumin: 1.7 g/dL — ABNORMAL LOW (ref 3.5–5.0)
Alkaline Phosphatase: 75 U/L (ref 38–126)
Anion gap: 10 (ref 5–15)
BUN: <5 mg/dL — ABNORMAL LOW (ref 8–23)
CO2: 28 mmol/L (ref 22–32)
Calcium: 7.7 mg/dL — ABNORMAL LOW (ref 8.9–10.3)
Chloride: 95 mmol/L — ABNORMAL LOW (ref 98–111)
Creatinine, Ser: 0.79 mg/dL (ref 0.44–1.00)
GFR, Estimated: >60 mL/min (ref >60)
Glucose, Bld: 94 mg/dL (ref 70–99)
Potassium: 3 mmol/L — ABNORMAL LOW (ref 3.5–5.1)
Sodium: 133 mmol/L — ABNORMAL LOW (ref 135–145)
Total Bilirubin: 0.4 mg/dL (ref 0.0–1.2)
Total Protein: 5.1 g/dL — ABNORMAL LOW (ref 6.5–8.1)

## 2023-11-16 LAB — RESPIRATORY PANEL BY PCR

## 2023-11-16 LAB — CBC WITH DIFFERENTIAL/PLATELET
Abs Immature Granulocytes: 0.06 10*3/uL (ref 0.00–0.07)
Basophils Absolute: 0 10*3/uL (ref 0.0–0.1)
Basophils Relative: 1 %
Eosinophils Absolute: 0 10*3/uL (ref 0.0–0.5)
Eosinophils Relative: 0 %
HCT: 24.3 % — ABNORMAL LOW (ref 36.0–46.0)
Hemoglobin: 8.2 g/dL — ABNORMAL LOW (ref 12.0–15.0)
Immature Granulocytes: 2 %
Lymphocytes Relative: 35 %
Lymphs Abs: 1.1 10*3/uL (ref 0.7–4.0)
MCH: 27.3 pg (ref 26.0–34.0)
MCHC: 33.7 g/dL (ref 30.0–36.0)
MCV: 81 fL (ref 80.0–100.0)
Monocytes Absolute: 0.6 10*3/uL (ref 0.1–1.0)
Monocytes Relative: 19 %
Neutro Abs: 1.4 10*3/uL — ABNORMAL LOW (ref 1.7–7.7)
Neutrophils Relative %: 43 %
Platelets: 677 10*3/uL — ABNORMAL HIGH (ref 150–400)
RBC: 3 MIL/uL — ABNORMAL LOW (ref 3.87–5.11)
RDW: 16.4 % — ABNORMAL HIGH (ref 11.5–15.5)
WBC: 3.1 10*3/uL — ABNORMAL LOW (ref 4.0–10.5)
nRBC: 0 % (ref 0.0–0.2)

## 2023-11-16 LAB — MAGNESIUM: Magnesium: 1.7 mg/dL (ref 1.7–2.4)

## 2023-11-16 LAB — CULTURE, BLOOD (ROUTINE X 2)
Culture: NO GROWTH
Culture: NO GROWTH
Special Requests: ADEQUATE

## 2023-11-16 LAB — PHOSPHORUS: Phosphorus: 3.2 mg/dL (ref 2.5–4.6)

## 2023-11-16 LAB — C-REACTIVE PROTEIN: CRP: 4.7 mg/dL — ABNORMAL HIGH (ref <1.0)

## 2023-11-16 MED ORDER — POTASSIUM CHLORIDE IN NACL 40-0.9 MEQ/L-% IV SOLN
INTRAVENOUS | Status: DC
  Filled 2023-11-16 (×2): qty 1000

## 2023-11-16 MED ORDER — MAGNESIUM SULFATE 2 GM/50ML IV SOLN
2.0000 g | Freq: Once | INTRAVENOUS | Status: AC
  Administered 2023-11-16: 2 g via INTRAVENOUS
  Filled 2023-11-16: qty 50

## 2023-11-16 MED ORDER — POTASSIUM CHLORIDE CRYS ER 20 MEQ PO TBCR
40.0000 meq | EXTENDED_RELEASE_TABLET | Freq: Once | ORAL | Status: AC
  Administered 2023-11-16: 40 meq via ORAL
  Filled 2023-11-16: qty 2

## 2023-11-16 NOTE — Progress Notes (Addendum)
  Progress Note   Patient: Amanda Potter BJY:[id number] DOB: 17-Apr-1946 DOA: 11/11/2023     5 DOS: the patient was seen and examined on 11/16/2023   Brief hospital course: Yeni Jiggetts is a 78 y.o. 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  Assessment and Plan: Neutropenic fever likely secondary to pneumonia - IV zosyn 3.375 g q8hr    Hypotension  - IV NS w/ 40K+ @ 75 cc/hr    Stage IV lung adenocarcinoma w/ malignant R pleural effusion  - Outpt follow up with Dr. Arbutus Ped    Hyperthyroidism  - Methimazole 20 mg PO daily    HLD  - Zocor 20 mg PO at bedtime    GERD  - Protonix 40 mg PO daily   Subjective: Pt seen and examined at the bedside. BP's have been fluctuant. IV fluids changed to IV NS w/40K due to ongoing hypokalemia. 2g of IV mag also given. Continue to work w/ therapy. Resp panel checked today as well as she was complaining of her nose running and her "sinuses".  Physical Exam: Vitals:   11/15/23 1424 11/15/23 1608 11/15/23 2026 11/16/23 0809  BP: (!) 103/51 (!) 100/54 107/63 112/62  Pulse: 99 98 (!) 107 (!) 101  Resp: 18 18 18 18   Temp: 98.2 F (36.8 C) 97.7 F (36.5 C) 97.7 F (36.5 C) 97.7 F (36.5 C)  TempSrc:  Oral Oral   SpO2: 97% 100% 98% 98%  Weight:      Height:       HENT:     Head: Normocephalic.     Mouth/Throat:     Mouth: Mucous membranes are moist.  Cardiovascular:     Rate and Rhythm: Normal rate and regular rhythm.  Pulmonary:     Effort: Pulmonary effort is normal.  Abdominal:     Palpations: Abdomen is soft.  Musculoskeletal:        General: Normal range of motion.     Cervical back: Neck supple.  Skin:    General: Skin is warm.  Neurological:     Mental Status: She is alert. Mental status is at baseline.   Psychiatric:        Mood and Affect: Mood normal.      Disposition: Status is: Inpatient Remains inpatient appropriate because: IV fluids and IV antibx  Planned Discharge Destination:  Dispo per pt's clinical progress     Time spent: 35 minutes  Author: Baron Hamper , MD 11/16/2023 11:11 AM  For on call review [email or web address].

## 2023-11-16 NOTE — Progress Notes (Signed)
 Occupational Therapy Treatment Patient Details Name: Amanda Potter MRN: [id number] DOB: 1945/12/19 Today's Date: 11/16/2023   History of present illness Pt is a 78 y/o F admitted on 11/11/23 after presenting for evaluation of a fever. Pt found to be neutropenic. 2/18 Rt thoracentesis  PMH: recently diagnosed stage IV lung adenocarcinoma with malignant R pleural effusion, hyperthyroidism, HLD, GERD   OT comments  Pt. Seen for skilled OT treatment session.  Pt. MOD I  for bed mobility in/out of bed with bed set to mimic home environment.  LB dressing MOD I.  Ambulation to/from b.room MOD I with intermittent furniture walking similar to pt. Report of how she manages at home.  Will alert OTR/L  that current acute OT goals met for reassess of need for updated goals or d/c from acute OT.        If plan is discharge home, recommend the following:  A little help with walking and/or transfers;A little help with bathing/dressing/bathroom;Assistance with cooking/housework;Help with stairs or ramp for entrance;Assist for transportation   Equipment Recommendations  None recommended by OT    Recommendations for Other Services      Precautions / Restrictions Precautions Precautions: Fall Precaution/Restrictions Comments: monitor O2 and HR       Mobility Bed Mobility Overal bed mobility: Modified Independent Bed Mobility: Supine to Sit, Sit to Supine     Supine to sit: Modified independent (Device/Increase time) Sit to supine: Supervision, HOB elevated, Used rails        Transfers Overall transfer level: Modified independent Equipment used: None Transfers: Sit to/from Stand, Bed to chair/wheelchair/BSC Sit to Stand: Supervision     Step pivot transfers: Supervision           Balance                                           ADL either performed or assessed with clinical judgement   ADL Overall ADL's : Needs assistance/impaired                      Lower Body Dressing: Modified independent;Sitting/lateral leans   Toilet Transfer: Modified Independent Toilet Transfer Details (indicate cue type and reason): intermittent furniture walking, reports she has been going to/from b.room while here without assistance   Toileting - Clothing Manipulation Details (indicate cue type and reason): describes that she requires no assistance with this task     Functional mobility during ADLs: Modified independent      Extremity/Trunk Assessment              Vision       Perception     Praxis     Communication Communication Communication: No apparent difficulties   Cognition Arousal: Alert Behavior During Therapy: WFL for tasks assessed/performed Cognition: No apparent impairments                               Following commands: Intact        Cueing   Cueing Techniques: Verbal cues  Exercises      Shoulder Instructions       General Comments      Pertinent Vitals/ Pain       Pain Assessment Pain Assessment: No/denies pain  Home Living  Prior Functioning/Environment              Frequency  Min 1X/week        Progress Toward Goals  OT Goals(current goals can now be found in the care plan section)        Plan      Co-evaluation                 AM-PAC OT "6 Clicks" Daily Activity     Outcome Measure   Help from another person eating meals?: None Help from another person taking care of personal grooming?: A Little Help from another person toileting, which includes using toliet, bedpan, or urinal?: A Little Help from another person bathing (including washing, rinsing, drying)?: A Little Help from another person to put on and taking off regular upper body clothing?: A Little Help from another person to put on and taking off regular lower body clothing?: A Little 6 Click Score: 19    End of Session Equipment Utilized  During Treatment: Gait belt  OT Visit Diagnosis: Unsteadiness on feet (R26.81);Muscle weakness (generalized) (M62.81)   Activity Tolerance Patient tolerated treatment well   Patient Left in bed;with call bell/phone within reach;with bed alarm set   Nurse Communication Other (comment) (rn present at beginning of session, states ok to work with pt. today, alerted rn air in the line of iv and she came and fixed it during session)        Time: 2595-6387 OT Time Calculation (min): 18 min  Charges: OT General Charges $OT Visit: 1 Visit OT Treatments $Self Care/Home Management : 8-22 mins  Boneta Lucks, COTA/L Acute Rehabilitation [phone]   Alessandra Bevels Lorraine-COTA/L 11/16/2023, 10:52 AM

## 2023-11-16 NOTE — Progress Notes (Signed)
 Mobility Specialist: Progress Note   11/16/23 1123  Mobility  Activity Ambulated with assistance in hallway  Level of Assistance Standby assist, set-up cues, supervision of patient - no hands on  Assistive Device Other (Comment) (IV pole + occasional use of handrails)  Distance Ambulated (ft) 300 ft  Activity Response Tolerated well  Mobility Referral Yes  Mobility visit 1 Mobility  Mobility Specialist Start Time (ACUTE ONLY) [id number]  Mobility Specialist Stop Time (ACUTE ONLY) 0913  Mobility Specialist Time Calculation (min) (ACUTE ONLY) 22 min    Pt was agreeable to mobility session - received in bed. C/o sinus pressure and runny nose but stated the tylenol she got this morning helped. CG initially d/t unsteadiness; pt using both IV pole and hallway handrails, but steadiness improved and pt ambulated with only IV pole under SV. Returned to room without fault. Left on EOB with all needs met, call bell in reach.    Maurene Capes Mobility Specialist Please contact via SecureChat or Rehab office at [phone]

## 2023-11-17 DIAGNOSIS — D709 Neutropenia, unspecified: Secondary | ICD-10-CM | POA: Diagnosis not present

## 2023-11-17 DIAGNOSIS — R531 Weakness: Secondary | ICD-10-CM

## 2023-11-17 DIAGNOSIS — C3491 Malignant neoplasm of unspecified part of right bronchus or lung: Secondary | ICD-10-CM | POA: Diagnosis not present

## 2023-11-17 DIAGNOSIS — E876 Hypokalemia: Secondary | ICD-10-CM | POA: Diagnosis not present

## 2023-11-17 DIAGNOSIS — J910 Malignant pleural effusion: Secondary | ICD-10-CM | POA: Diagnosis not present

## 2023-11-17 LAB — CBC WITH DIFFERENTIAL/PLATELET
Abs Immature Granulocytes: 0.1 10*3/uL — ABNORMAL HIGH (ref 0.00–0.07)
Basophils Absolute: 0 10*3/uL (ref 0.0–0.1)
Basophils Relative: 1 %
Eosinophils Absolute: 0 10*3/uL (ref 0.0–0.5)
Eosinophils Relative: 0 %
HCT: 23.8 % — ABNORMAL LOW (ref 36.0–46.0)
Hemoglobin: 7.8 g/dL — ABNORMAL LOW (ref 12.0–15.0)
Immature Granulocytes: 2 %
Lymphocytes Relative: 30 %
Lymphs Abs: 1.2 10*3/uL (ref 0.7–4.0)
MCH: 26.7 pg (ref 26.0–34.0)
MCHC: 32.8 g/dL (ref 30.0–36.0)
MCV: 81.5 fL (ref 80.0–100.0)
Monocytes Absolute: 0.6 10*3/uL (ref 0.1–1.0)
Monocytes Relative: 15 %
Neutro Abs: 2.2 10*3/uL (ref 1.7–7.7)
Neutrophils Relative %: 52 %
Platelets: 737 10*3/uL — ABNORMAL HIGH (ref 150–400)
RBC: 2.92 MIL/uL — ABNORMAL LOW (ref 3.87–5.11)
RDW: 16.4 % — ABNORMAL HIGH (ref 11.5–15.5)
WBC: 4.1 10*3/uL (ref 4.0–10.5)
nRBC: 0 % (ref 0.0–0.2)

## 2023-11-17 LAB — COMPREHENSIVE METABOLIC PANEL
ALT: 12 U/L (ref 0–44)
AST: 21 U/L (ref 15–41)
Albumin: 1.5 g/dL — ABNORMAL LOW (ref 3.5–5.0)
Alkaline Phosphatase: 85 U/L (ref 38–126)
Anion gap: 9 (ref 5–15)
BUN: <5 mg/dL — ABNORMAL LOW (ref 8–23)
CO2: 26 mmol/L (ref 22–32)
Calcium: 7.5 mg/dL — ABNORMAL LOW (ref 8.9–10.3)
Chloride: 97 mmol/L — ABNORMAL LOW (ref 98–111)
Creatinine, Ser: 0.65 mg/dL (ref 0.44–1.00)
GFR, Estimated: >60 mL/min (ref >60)
Glucose, Bld: 89 mg/dL (ref 70–99)
Potassium: 2.8 mmol/L — ABNORMAL LOW (ref 3.5–5.1)
Sodium: 132 mmol/L — ABNORMAL LOW (ref 135–145)
Total Bilirubin: 0.5 mg/dL (ref 0.0–1.2)
Total Protein: 4.8 g/dL — ABNORMAL LOW (ref 6.5–8.1)

## 2023-11-17 LAB — PHOSPHORUS: Phosphorus: 3.2 mg/dL (ref 2.5–4.6)

## 2023-11-17 LAB — MAGNESIUM: Magnesium: 1.7 mg/dL (ref 1.7–2.4)

## 2023-11-17 LAB — C-REACTIVE PROTEIN: CRP: 4.1 mg/dL — ABNORMAL HIGH (ref <1.0)

## 2023-11-17 MED ORDER — POTASSIUM CHLORIDE 10 MEQ/100ML IV SOLN
10.0000 meq | Freq: Once | INTRAVENOUS | Status: AC
  Administered 2023-11-17: 10 meq via INTRAVENOUS
  Filled 2023-11-17: qty 100

## 2023-11-17 MED ORDER — POTASSIUM CHLORIDE 20 MEQ PO PACK
40.0000 meq | PACK | Freq: Once | ORAL | Status: AC
  Administered 2023-11-17: 40 meq via ORAL
  Filled 2023-11-17: qty 2

## 2023-11-17 MED ORDER — MAGNESIUM SULFATE IN D5W 1-5 GM/100ML-% IV SOLN
1.0000 g | Freq: Once | INTRAVENOUS | Status: AC
  Administered 2023-11-17: 1 g via INTRAVENOUS
  Filled 2023-11-17: qty 100

## 2023-11-17 MED ORDER — POTASSIUM CHLORIDE 10 MEQ/100ML IV SOLN
10.0000 meq | INTRAVENOUS | Status: AC
  Administered 2023-11-17 (×2): 10 meq via INTRAVENOUS
  Filled 2023-11-17 (×2): qty 100

## 2023-11-17 NOTE — Progress Notes (Signed)
 Updated pemetrexed ERX to [id number] - 500 mg/m2  Pryor Ochoa, PharmD 11/17/23

## 2023-11-17 NOTE — Progress Notes (Signed)
 [facility]  PROGRESS NOTE  Amanda Potter OZH:[id number] DOB: Jul 23, 1946 DOA: 11/11/2023 PCP: Richmond Campbell., PA-C   Brief HPI:     78 y.o. 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    Assessment/Plan:   Neutropenic fever likely secondary to pneumonia -neutropenia has resolved, patient started on IV Zosyn.  Will discharge on Augmentin when stable.  Stage IV lung adenocarcinoma with malignant right pleural effusion -Underwent thoracentesis on 11/13/2023 yielding 350 cc of clear yellow fluid. -Fluid analysis consistent with exudate however no different from previous thoracentesis in January -Currently on antibiotics as above -Pleural fluid culture showed no growth in 4 days -Follow-up oncology as outpatient  Hyperthyroidism -Continue methimazole 20 mg p.o. daily  Hyperlipidemia -Continue Zocor 20 mg p.o. daily  GERD -Continue Protonix   Hypokalemia -Potassium is 2.8 -Replace potassium and follow BMP in am    Medications     enoxaparin (LOVENOX) injection  40 mg Subcutaneous Q24H   methimazole  20 mg Oral Daily   pantoprazole  40 mg Oral Daily   potassium chloride  40 mEq Oral Once   simvastatin  20 mg Oral QHS     Data Reviewed:   CBG:  Recent Labs  Lab 11/13/23 2039  GLUCAP 146*    SpO2: 98 %    Vitals:   11/16/23 2343 11/17/23 0534 11/17/23 0748 11/17/23 1204  BP: (!) 126/58 112/60 125/76 126/70  Pulse: 98 (!) 109 86 92  Resp: 18 18 18 18   Temp: 99.5 F (37.5 C) 98.6 F (37 C) (!) 97.5 F (36.4 C) 98.6 F (37 C)  TempSrc: Oral Oral    SpO2: 95% 95% 100% 98%  Weight:      Height:          Data Reviewed:  Basic Metabolic Panel: Recent Labs  Lab 11/12/23 0651 11/13/23 0624 11/14/23 0627 11/15/23 0630  11/16/23 0630 11/17/23 0733  NA 133* 132* 132* 132* 133* 132*  K 3.5 3.3* 3.3* 3.7 3.0* 2.8*  CL 95* 94* 95* 95* 95* 97*  CO2 27 27 27 28 28 26   GLUCOSE 110* 93 104* 120* 94 89  BUN 5* 5* 5* <5* <5* <5*  CREATININE 0.71 0.69 1.05* 1.09* 0.79 0.65  CALCIUM 7.8* 7.6* 7.8* 7.9* 7.7* 7.5*  MG 1.7  --  1.2* 2.0 1.7 1.7  PHOS  --   --   --  2.9 3.2 3.2    CBC: Recent Labs  Lab 11/12/23 0651 11/13/23 0624 11/14/23 0627 11/15/23 0630 11/16/23 0630 11/17/23 0733  WBC 1.7*  1.7* 2.4* 2.8* 3.0* 3.1* 4.1  NEUTROABS 0.9* 1.2* 1.2*  --  1.4* 2.2  HGB 8.1*  8.1* 8.0* 7.7* 8.1* 8.2* 7.8*  HCT 24.6*  24.4* 24.0* 23.3* 24.0* 24.3* 23.8*  MCV 81.7  80.8 81.4 80.6 80.5 81.0 81.5  PLT 233  233 307 414* 591* 677* 737*    LFT Recent Labs  Lab 11/11/23 1550 11/15/23 0630 11/16/23 0630 11/17/23 0733  AST 14* 14* 18 21  ALT 10 9 11 12   ALKPHOS 87 67 75 85  BILITOT 0.5 0.4 0.4 0.5  PROT 5.7* 4.9* 5.1* 4.8*  ALBUMIN 1.8* 1.6* 1.7* 1.5*     Antibiotics: Anti-infectives (From admission, onward)    Start     Dose/Rate Route Frequency Ordered Stop   11/13/23 1400  piperacillin-tazobactam (ZOSYN) IVPB 3.375 g        3.375 g 12.5 mL/hr over 240 Minutes Intravenous Every 8 hours 11/13/23 1047     11/12/23 0500  ceFEPIme (MAXIPIME) 2 g in sodium chloride 0.9 % 100 mL IVPB  Status:  Discontinued        2 g 200 mL/hr over 30 Minutes Intravenous Every 12 hours 11/11/23 2041 11/13/23 1035   11/11/23 1615  ceFEPIme (MAXIPIME) 2 g in sodium chloride 0.9 % 100 mL IVPB        2 g 200 mL/hr over 30 Minutes Intravenous  Once 11/11/23 1607 11/11/23 1752   11/11/23 1615  metroNIDAZOLE (FLAGYL) IVPB 500 mg        500 mg 100 mL/hr over 60 Minutes Intravenous  Once 11/11/23 1607 11/11/23 2042   11/11/23 1615  vancomycin (VANCOCIN) IVPB 1000 mg/200 mL premix        1,000 mg 200 mL/hr over 60 Minutes Intravenous  Once 11/11/23 1607 11/11/23 1913        DVT prophylaxis: Lovenox  Code Status:  Full code  Family Communication: No family at bedside   CONSULTS    Subjective   Denies any complaints.   Objective    Physical Examination:   General-appears in no acute distress Heart-S1-S2, regular, no murmur auscultated Lungs-clear to auscultation bilaterally, no wheezing or crackles auscultated Abdomen-soft, nontender, no organomegaly Extremities-no edema in the lower extremities Neuro-alert, oriented x3, no focal deficit noted   Status is: Inpatient:             Meredeth Ide   [facility] If 7PM-7AM, please contact night-coverage at [email or web address], Office  [phone]   11/17/2023, 2:30 PM  LOS: 6 days

## 2023-11-17 NOTE — Progress Notes (Signed)
   11/17/23 1507  Mobility  Activity Refused mobility  Mobility Specialist Start Time (ACUTE ONLY) 1507  Mobility Specialist Stop Time (ACUTE ONLY) 1509  Mobility Specialist Time Calculation (min) (ACUTE ONLY) 2 min   Mobility Specialist: Progress Note  Pt refused mobility session d/t "not feeling good, has a bunch a mucus." Pt denied wanting to ambulate or sit EOB. - Received and left in bed with all needs met. Call bell within reach. MS will follow up as time permits. Visitor and LPN present.   Barnie Mort, BS Mobility Specialist Please contact via SecureChat or Rehab office at [phone].

## 2023-11-18 DIAGNOSIS — D709 Neutropenia, unspecified: Secondary | ICD-10-CM | POA: Diagnosis not present

## 2023-11-18 DIAGNOSIS — C3491 Malignant neoplasm of unspecified part of right bronchus or lung: Secondary | ICD-10-CM | POA: Diagnosis not present

## 2023-11-18 DIAGNOSIS — J910 Malignant pleural effusion: Secondary | ICD-10-CM | POA: Diagnosis not present

## 2023-11-18 DIAGNOSIS — E876 Hypokalemia: Secondary | ICD-10-CM | POA: Diagnosis not present

## 2023-11-18 LAB — CBC WITH DIFFERENTIAL/PLATELET
Abs Immature Granulocytes: 0.07 10*3/uL (ref 0.00–0.07)
Basophils Absolute: 0.1 10*3/uL (ref 0.0–0.1)
Basophils Relative: 1 %
Eosinophils Absolute: 0 10*3/uL (ref 0.0–0.5)
Eosinophils Relative: 0 %
HCT: 23.8 % — ABNORMAL LOW (ref 36.0–46.0)
Hemoglobin: 8 g/dL — ABNORMAL LOW (ref 12.0–15.0)
Immature Granulocytes: 1 %
Lymphocytes Relative: 22 %
Lymphs Abs: 1.2 10*3/uL (ref 0.7–4.0)
MCH: 27 pg (ref 26.0–34.0)
MCHC: 33.6 g/dL (ref 30.0–36.0)
MCV: 80.4 fL (ref 80.0–100.0)
Monocytes Absolute: 0.6 10*3/uL (ref 0.1–1.0)
Monocytes Relative: 10 %
Neutro Abs: 3.7 10*3/uL (ref 1.7–7.7)
Neutrophils Relative %: 66 %
Platelets: 770 10*3/uL — ABNORMAL HIGH (ref 150–400)
RBC: 2.96 MIL/uL — ABNORMAL LOW (ref 3.87–5.11)
RDW: 16.6 % — ABNORMAL HIGH (ref 11.5–15.5)
WBC: 5.6 10*3/uL (ref 4.0–10.5)
nRBC: 0 % (ref 0.0–0.2)

## 2023-11-18 LAB — COMPREHENSIVE METABOLIC PANEL
ALT: 12 U/L (ref 0–44)
AST: 25 U/L (ref 15–41)
Albumin: 1.7 g/dL — ABNORMAL LOW (ref 3.5–5.0)
Alkaline Phosphatase: 92 U/L (ref 38–126)
Anion gap: 8 (ref 5–15)
BUN: <5 mg/dL — ABNORMAL LOW (ref 8–23)
CO2: 26 mmol/L (ref 22–32)
Calcium: 7.8 mg/dL — ABNORMAL LOW (ref 8.9–10.3)
Chloride: 98 mmol/L (ref 98–111)
Creatinine, Ser: 0.71 mg/dL (ref 0.44–1.00)
GFR, Estimated: >60 mL/min (ref >60)
Glucose, Bld: 103 mg/dL — ABNORMAL HIGH (ref 70–99)
Potassium: 3 mmol/L — ABNORMAL LOW (ref 3.5–5.1)
Sodium: 132 mmol/L — ABNORMAL LOW (ref 135–145)
Total Bilirubin: 0.4 mg/dL (ref 0.0–1.2)
Total Protein: 4.8 g/dL — ABNORMAL LOW (ref 6.5–8.1)

## 2023-11-18 LAB — CULTURE, BODY FLUID W GRAM STAIN -BOTTLE: Culture: NO GROWTH

## 2023-11-18 MED ORDER — POTASSIUM CHLORIDE 10 MEQ/100ML IV SOLN
10.0000 meq | Freq: Once | INTRAVENOUS | Status: AC
  Administered 2023-11-18: 10 meq via INTRAVENOUS
  Filled 2023-11-18: qty 100

## 2023-11-18 MED ORDER — FLUTICASONE PROPIONATE 50 MCG/ACT NA SUSP
1.0000 | Freq: Every day | NASAL | Status: DC
  Administered 2023-11-19 – 2023-11-28 (×8): 1 via NASAL
  Filled 2023-11-18 (×2): qty 16

## 2023-11-18 MED ORDER — POTASSIUM CHLORIDE 20 MEQ PO PACK
40.0000 meq | PACK | Freq: Once | ORAL | Status: AC
  Administered 2023-11-18: 40 meq via ORAL
  Filled 2023-11-18: qty 2

## 2023-11-18 MED ORDER — LORATADINE 10 MG PO TABS
10.0000 mg | ORAL_TABLET | Freq: Every day | ORAL | Status: DC
  Administered 2023-11-18 – 2023-11-28 (×11): 10 mg via ORAL
  Filled 2023-11-18 (×11): qty 1

## 2023-11-18 MED ORDER — GUAIFENESIN ER 600 MG PO TB12
600.0000 mg | ORAL_TABLET | Freq: Two times a day (BID) | ORAL | Status: DC
  Administered 2023-11-18 – 2023-11-28 (×21): 600 mg via ORAL
  Filled 2023-11-18 (×21): qty 1

## 2023-11-18 NOTE — Progress Notes (Signed)
 [facility]  PROGRESS NOTE  Amanda Potter HKV:[id number] DOB: 1946/05/07 DOA: 11/11/2023 PCP: Richmond Campbell., PA-C   Brief HPI:     78 y.o. 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    Assessment/Plan:   Neutropenic fever likely secondary to pneumonia -neutropenia has resolved, patient started on IV Zosyn.   Will discharge on Augmentin when stable.  Stage IV lung adenocarcinoma with malignant right pleural effusion -Underwent thoracentesis on 11/13/2023 yielding 350 cc of clear yellow fluid. -Fluid analysis consistent with exudate however no different from previous thoracentesis in January -Currently on antibiotics as above -Pleural fluid culture showed no growth in 4 days -Follow-up oncology as outpatient  Hyperthyroidism -Continue methimazole 20 mg p.o. daily  Hyperlipidemia -Continue Zocor 20 mg p.o. daily  GERD -Continue Protonix   Hypokalemia -Potassium is 3.0 -Replace potassium and follow BMP in am  Sinusitis -Start Flonase, Mucinex, Claritin    Medications     enoxaparin (LOVENOX) injection  40 mg Subcutaneous Q24H   fluticasone  1 spray Each Nare Daily   guaiFENesin  600 mg Oral BID   loratadine  10 mg Oral Daily   methimazole  20 mg Oral Daily   pantoprazole  40 mg Oral Daily   simvastatin  20 mg Oral QHS     Data Reviewed:   CBG:  Recent Labs  Lab 11/13/23 2039  GLUCAP 146*    SpO2: 96 %    Vitals:   11/17/23 1204 11/17/23 2020 11/18/23 0459 11/18/23 0931  BP: 126/70 124/68 135/67 129/72  Pulse: 92 87 88 98  Resp: 18 16 16 16   Temp: 98.6 F (37 C) 97.8 F (36.6 C) 97.9 F (36.6 C) 98.5 F (36.9 C)  TempSrc:   Oral   SpO2: 98% 98% 98% 96%  Weight:      Height:          Data Reviewed:  Basic  Metabolic Panel: Recent Labs  Lab 11/12/23 0651 11/13/23 0624 11/14/23 0627 11/15/23 0630 11/16/23 0630 11/17/23 0733 11/18/23 0735  NA 133*   < > 132* 132* 133* 132* 132*  K 3.5   < > 3.3* 3.7 3.0* 2.8* 3.0*  CL 95*   < > 95* 95* 95* 97* 98  CO2 27   < > 27 28 28 26 26   GLUCOSE 110*   < > 104* 120* 94 89 103*  BUN 5*   < > 5* <5* <5* <5* <5*  CREATININE 0.71   < > 1.05* 1.09* 0.79 0.65 0.71  CALCIUM 7.8*   < > 7.8* 7.9* 7.7* 7.5* 7.8*  MG 1.7  --  1.2* 2.0 1.7 1.7  --   PHOS  --   --   --  2.9 3.2 3.2  --    < > = values in this interval not displayed.    CBC: Recent Labs  Lab 11/13/23 0624 11/14/23 0627 11/15/23 0630 11/16/23 0630 11/17/23 0733 11/18/23 0735  WBC 2.4* 2.8* 3.0* 3.1* 4.1 5.6  NEUTROABS 1.2* 1.2*  --  1.4* 2.2 3.7  HGB 8.0* 7.7* 8.1* 8.2* 7.8* 8.0*  HCT 24.0* 23.3* 24.0* 24.3* 23.8* 23.8*  MCV 81.4 80.6 80.5 81.0 81.5 80.4  PLT 307 414* 591* 677* 737* 770*    LFT Recent Labs  Lab 11/11/23 1550 11/15/23 0630 11/16/23 0630 11/17/23 5643 11/18/23 3295  AST 14* 14* 18 21 25   ALT 10 9 11 12 12   ALKPHOS 87 67 75 85 92  BILITOT 0.5 0.4 0.4 0.5 0.4  PROT 5.7* 4.9* 5.1* 4.8* 4.8*  ALBUMIN 1.8* 1.6* 1.7* 1.5* 1.7*     Antibiotics: Anti-infectives (From admission, onward)    Start     Dose/Rate Route Frequency Ordered Stop   11/13/23 1400  piperacillin-tazobactam (ZOSYN) IVPB 3.375 g        3.375 g 12.5 mL/hr over 240 Minutes Intravenous Every 8 hours 11/13/23 1047     11/12/23 0500  ceFEPIme (MAXIPIME) 2 g in sodium chloride 0.9 % 100 mL IVPB  Status:  Discontinued        2 g 200 mL/hr over 30 Minutes Intravenous Every 12 hours 11/11/23 2041 11/13/23 1035   11/11/23 1615  ceFEPIme (MAXIPIME) 2 g in sodium chloride 0.9 % 100 mL IVPB        2 g 200 mL/hr over 30 Minutes Intravenous  Once 11/11/23 1607 11/11/23 1752   11/11/23 1615  metroNIDAZOLE (FLAGYL) IVPB 500 mg        500 mg 100 mL/hr over 60 Minutes Intravenous  Once 11/11/23 1607  11/11/23 2042   11/11/23 1615  vancomycin (VANCOCIN) IVPB 1000 mg/200 mL premix        1,000 mg 200 mL/hr over 60 Minutes Intravenous  Once 11/11/23 1607 11/11/23 1913        DVT prophylaxis: Lovenox  Code Status: Full code  Family Communication: Discussed with daughter at bedside   CONSULTS    Subjective   Potassium is still low despite replacement.  Complains of upper nasal congestion   Objective    Physical Examination:  General-appears in no acute distress Heart-S1-S2, regular, no murmur auscultated Lungs-clear to auscultation bilaterally, no wheezing or crackles auscultated Abdomen-soft, nontender, no organomegaly Extremities-no edema in the lower extremities Neuro-alert, oriented x3, no focal deficit noted   Status is: Inpatient:             Meredeth Ide   [facility] If 7PM-7AM, please contact night-coverage at [email or web address], Office  [phone]   11/18/2023, 12:00 PM  LOS: 7 days

## 2023-11-18 NOTE — Plan of Care (Signed)

## 2023-11-18 NOTE — Progress Notes (Signed)
   11/18/23 1011  Mobility  Activity  (Bed Level Exercises)  Level of Assistance Standby assist, set-up cues, supervision of patient - no hands on  Assistive Device None  Range of Motion/Exercises Left arm;Left leg;Right leg;Right arm;Active  Activity Response Tolerated fair  Mobility Referral Yes  Mobility visit 1 Mobility  Mobility Specialist Start Time (ACUTE ONLY) 1005  Mobility Specialist Stop Time (ACUTE ONLY) 1011  Mobility Specialist Time Calculation (min) (ACUTE ONLY) 6 min   Mobility Specialist: Progress Note  Pt agreeable to mobility session - received in bed. C/o headache, dry mouth, and overall not feeling good. Pt deferred further mobility despite encouragement. Left in bed with all needs met - call bell within reach. Visitor present.  Barnie Mort, BS Mobility Specialist Please contact via SecureChat or  Rehab office at [phone].

## 2023-11-19 DIAGNOSIS — D709 Neutropenia, unspecified: Secondary | ICD-10-CM | POA: Diagnosis not present

## 2023-11-19 DIAGNOSIS — R5081 Fever presenting with conditions classified elsewhere: Secondary | ICD-10-CM | POA: Diagnosis not present

## 2023-11-19 LAB — CBC WITH DIFFERENTIAL/PLATELET
Abs Immature Granulocytes: 0.11 10*3/uL — ABNORMAL HIGH (ref 0.00–0.07)
Basophils Absolute: 0 10*3/uL (ref 0.0–0.1)
Basophils Relative: 0 %
Eosinophils Absolute: 0 10*3/uL (ref 0.0–0.5)
Eosinophils Relative: 0 %
HCT: 25.8 % — ABNORMAL LOW (ref 36.0–46.0)
Hemoglobin: 8.8 g/dL — ABNORMAL LOW (ref 12.0–15.0)
Immature Granulocytes: 2 %
Lymphocytes Relative: 23 %
Lymphs Abs: 1.6 10*3/uL (ref 0.7–4.0)
MCH: 27.2 pg (ref 26.0–34.0)
MCHC: 34.1 g/dL (ref 30.0–36.0)
MCV: 79.6 fL — ABNORMAL LOW (ref 80.0–100.0)
Monocytes Absolute: 0.7 10*3/uL (ref 0.1–1.0)
Monocytes Relative: 9 %
Neutro Abs: 4.8 10*3/uL (ref 1.7–7.7)
Neutrophils Relative %: 66 %
Platelets: 850 10*3/uL — ABNORMAL HIGH (ref 150–400)
RBC: 3.24 MIL/uL — ABNORMAL LOW (ref 3.87–5.11)
RDW: 16.8 % — ABNORMAL HIGH (ref 11.5–15.5)
WBC: 7.3 10*3/uL (ref 4.0–10.5)
nRBC: 0 % (ref 0.0–0.2)

## 2023-11-19 LAB — RENAL FUNCTION PANEL
Albumin: 1.7 g/dL — ABNORMAL LOW (ref 3.5–5.0)
Anion gap: 8 (ref 5–15)
BUN: <5 mg/dL — ABNORMAL LOW (ref 8–23)
CO2: 26 mmol/L (ref 22–32)
Calcium: 7.8 mg/dL — ABNORMAL LOW (ref 8.9–10.3)
Chloride: 93 mmol/L — ABNORMAL LOW (ref 98–111)
Creatinine, Ser: 0.69 mg/dL (ref 0.44–1.00)
GFR, Estimated: >60 mL/min (ref >60)
Glucose, Bld: 100 mg/dL — ABNORMAL HIGH (ref 70–99)
Phosphorus: 3.3 mg/dL (ref 2.5–4.6)
Potassium: 2.7 mmol/L — CL (ref 3.5–5.1)
Sodium: 127 mmol/L — ABNORMAL LOW (ref 135–145)

## 2023-11-19 LAB — MAGNESIUM: Magnesium: 1.2 mg/dL — ABNORMAL LOW (ref 1.7–2.4)

## 2023-11-19 MED ORDER — POTASSIUM CHLORIDE 20 MEQ PO PACK
40.0000 meq | PACK | ORAL | Status: AC
  Administered 2023-11-19 (×2): 40 meq via ORAL
  Filled 2023-11-19 (×2): qty 2

## 2023-11-19 MED ORDER — MAGNESIUM SULFATE 4 GM/100ML IV SOLN
4.0000 g | Freq: Once | INTRAVENOUS | Status: AC
  Administered 2023-11-19: 4 g via INTRAVENOUS
  Filled 2023-11-19: qty 100

## 2023-11-19 NOTE — Plan of Care (Signed)

## 2023-11-19 NOTE — Progress Notes (Signed)
 Occupational Therapy Treatment Patient Details Name: Amanda Potter MRN: [id number] DOB: [birth year] Today's Date: 11/19/2023   History of present illness Pt is a 78 y/o F admitted on 11/11/23 after presenting for evaluation of a fever. Pt found to be neutropenic. 2/18 Rt thoracentesis  PMH: recently diagnosed stage IV lung adenocarcinoma with malignant R pleural effusion, hyperthyroidism, HLD, GERD   OT comments  Pt progressing towards goals this session, reports 8/10 fatigue with sitting EOB and ambulating short distance in room. Pt able to stand at sink for grooming tasks, resting forearms on sink counter to perform. Pt needing incr time with all aspects of mobility but performs with CGA and holds IV pole. Pt presenting with impairments listed below, will follow acutely. Recommend HHOT at d/c, though pt declines.      If plan is discharge home, recommend the following:  A little help with walking and/or transfers;A little help with bathing/dressing/bathroom;Assistance with cooking/housework;Help with stairs or ramp for entrance;Assist for transportation   Equipment Recommendations  None recommended by OT    Recommendations for Other Services      Precautions / Restrictions Precautions Precautions: Fall Precaution/Restrictions Comments: monitor O2 and HR Restrictions Weight Bearing Restrictions Per Provider Order: No       Mobility Bed Mobility   Bed Mobility: Supine to Sit, Sit to Supine     Supine to sit: Contact guard Sit to supine: Contact guard assist   General bed mobility comments: increased time/effort    Transfers Overall transfer level: Needs assistance Equipment used: None (IV pole) Transfers: Sit to/from Stand Sit to Stand: Contact guard assist           General transfer comment: Guarding for safety     Balance Overall balance assessment: Needs assistance Sitting-balance support: No upper extremity supported, Feet supported Sitting balance-Leahy  Scale: Good Sitting balance - Comments: doff/don sock sitting EOB   Standing balance support: No upper extremity supported, During functional activity Standing balance-Leahy Scale: Fair Standing balance comment: reaching out frequently for countertop and pushing IV pole with ambulation in room                           ADL either performed or assessed with clinical judgement   ADL Overall ADL's : Needs assistance/impaired     Grooming: Wash/dry face;Set up;Standing Grooming Details (indicate cue type and reason): standing at sink                 Toilet Transfer: Contact guard assist;Ambulation Toilet Transfer Details (indicate cue type and reason): holding IV pole         Functional mobility during ADLs: Contact guard assist      Extremity/Trunk Assessment Upper Extremity Assessment Upper Extremity Assessment: Overall WFL for tasks assessed   Lower Extremity Assessment Lower Extremity Assessment: Defer to PT evaluation        Vision   Vision Assessment?: No apparent visual deficits   Perception Perception Perception: Within Functional Limits   Praxis Praxis Praxis: WFL   Communication Communication Communication: No apparent difficulties   Cognition Arousal: Alert Behavior During Therapy: WFL for tasks assessed/performed Cognition: No apparent impairments                               Following commands: Intact        Cueing   Cueing Techniques: Verbal cues  Exercises      Shoulder  Instructions       General Comments HR up to 125bpm    Pertinent Vitals/ Pain       Pain Assessment Pain Assessment: Faces Pain Score: 2  Faces Pain Scale: Hurts a little bit Pain Location: generalized Pain Descriptors / Indicators: Discomfort Pain Intervention(s): Limited activity within patient's tolerance, Monitored during session, Repositioned  Home Living                                          Prior  Functioning/Environment              Frequency  Min 1X/week        Progress Toward Goals  OT Goals(current goals can now be found in the care plan section)  Progress towards OT goals: Progressing toward goals  Acute Rehab OT Goals Patient Stated Goal: to go home OT Goal Formulation: With patient Time For Goal Achievement: 11/27/23 Potential to Achieve Goals: Good ADL Goals Pt Will Perform Upper Body Dressing: with modified independence Pt Will Perform Lower Body Dressing: with modified independence Pt Will Transfer to Toilet: with modified independence Pt Will Perform Toileting - Clothing Manipulation and hygiene: with modified independence  Plan      Co-evaluation                 AM-PAC OT "6 Clicks" Daily Activity     Outcome Measure   Help from another person eating meals?: None Help from another person taking care of personal grooming?: A Little Help from another person toileting, which includes using toliet, bedpan, or urinal?: A Little Help from another person bathing (including washing, rinsing, drying)?: A Little Help from another person to put on and taking off regular upper body clothing?: A Little Help from another person to put on and taking off regular lower body clothing?: A Little 6 Click Score: 19    End of Session    OT Visit Diagnosis: Unsteadiness on feet (R26.81);Muscle weakness (generalized) (M62.81)   Activity Tolerance Patient tolerated treatment well   Patient Left in bed;with call bell/phone within reach;with bed alarm set   Nurse Communication Mobility status        Time: 1610-9604 OT Time Calculation (min): 21 min  Charges: OT General Charges $OT Visit: 1 Visit OT Treatments $Self Care/Home Management : 8-22 mins  Carver Fila, OTD, OTR/L SecureChat Preferred Acute Rehab [phone]   Dalphine Handing 11/19/2023, 8:36 AM

## 2023-11-19 NOTE — Progress Notes (Signed)
 Physical Therapy Treatment Patient Details Name: Amanda Potter MRN: [id number] DOB: 10/20/1945 Today's Date: 11/19/2023   History of Present Illness Pt is a 78 y/o F admitted on 11/11/23 after presenting for evaluation of a fever. Pt found to be neutropenic. 2/18 Rt thoracentesis  PMH: recently diagnosed stage IV lung adenocarcinoma with malignant R pleural effusion, hyperthyroidism, HLD, GERD    PT Comments  Patient is fatigued with minimal activity and complains of feeling very sleepy today. She continues to prefer to use furniture for support with ambulation despite bringing the 4 wheeled walker to trial. Patient educated on energy conservation strategies to use in home setting. Recommend to continue PT to maximize independence and facilitate return to prior level of function.    If plan is discharge home, recommend the following: A little help with walking and/or transfers;A little help with bathing/dressing/bathroom;Assistance with cooking/housework;Assist for transportation;Help with stairs or ramp for entrance   Can travel by private vehicle        Equipment Recommendations  None recommended by PT    Recommendations for Other Services       Precautions / Restrictions Precautions Precautions: Fall Restrictions Weight Bearing Restrictions Per Provider Order: No     Mobility  Bed Mobility Overal bed mobility: Needs Assistance Bed Mobility: Supine to Sit, Sit to Supine     Supine to sit: Supervision Sit to supine: Supervision   General bed mobility comments: patient is requesting for therapist to allow increased time and provide no physical assistance if possible.    Transfers Overall transfer level: Needs assistance Equipment used: None Transfers: Sit to/from Stand Sit to Stand: Contact guard assist           General transfer comment: CGA for safety.    Ambulation/Gait Ambulation/Gait assistance: Contact guard assist Gait Distance (Feet): 15 Feet (x 2  bouts) Assistive device: None Gait Pattern/deviations: Decreased stride length, Trunk flexed, Step-through pattern Gait velocity: decreased     General Gait Details: patient continues to prefer to hold on to furniture in the room for balance with short distance ambulation despite brining the rollator to the bedside and encouragement to try using for safety. encouraged  patient to use rollator she has at home for safety and fall prevention. activity tolerance limited by fatigue with minimal activity   Stairs             Wheelchair Mobility     Tilt Bed    Modified Rankin (Stroke Patients Only)       Balance Overall balance assessment: Needs assistance Sitting-balance support: No upper extremity supported, Feet supported Sitting balance-Leahy Scale: Good     Standing balance support: No upper extremity supported, During functional activity Standing balance-Leahy Scale: Fair                              Hotel manager: No apparent difficulties  Cognition Arousal: Lethargic Behavior During Therapy: WFL for tasks assessed/performed   PT - Cognitive impairments: No apparent impairments                       PT - Cognition Comments: some continued decreased safety awareness (refusing the rollator) and increased time for command following initially as patient is groggy Following commands: Intact      Cueing Cueing Techniques: Verbal cues  Exercises      General Comments General comments (skin integrity, edema, etc.): educated patient on energy conservation strateiges  to use in home setting      Pertinent Vitals/Pain Pain Assessment Pain Assessment: Faces Faces Pain Scale: Hurts a little bit Pain Location: stomach Pain Descriptors / Indicators: Discomfort Pain Intervention(s): Monitored during session, Limited activity within patient's tolerance    Home Living                          Prior Function             PT Goals (current goals can now be found in the care plan section) Acute Rehab PT Goals Patient Stated Goal: to have more energy PT Goal Formulation: With patient Time For Goal Achievement: 11/26/23 Potential to Achieve Goals: Good Progress towards PT goals: Progressing toward goals    Frequency    Min 1X/week      PT Plan      Co-evaluation              AM-PAC PT "6 Clicks" Mobility   Outcome Measure  Help needed turning from your back to your side while in a flat bed without using bedrails?: A Little Help needed moving from lying on your back to sitting on the side of a flat bed without using bedrails?: A Little Help needed moving to and from a bed to a chair (including a wheelchair)?: A Little Help needed standing up from a chair using your arms (e.g., wheelchair or bedside chair)?: A Little Help needed to walk in hospital room?: A Little Help needed climbing 3-5 steps with a railing? : A Little 6 Click Score: 18    End of Session   Activity Tolerance: Patient limited by fatigue Patient left: in bed;with call bell/phone within reach;with bed alarm set (seated on edge of bed per patient request) Nurse Communication: Mobility status PT Visit Diagnosis: Muscle weakness (generalized) (M62.81);Other abnormalities of gait and mobility (R26.89);Unsteadiness on feet (R26.81)     Time: 1340-1401 PT Time Calculation (min) (ACUTE ONLY): 21 min  Charges:    $Therapeutic Activity: 8-22 mins PT General Charges $$ ACUTE PT VISIT: 1 Visit                     Donna Bernard, PT, MPT    Ina Homes 11/19/2023, 3:04 PM

## 2023-11-19 NOTE — Progress Notes (Signed)
 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  Patient was admitted with neutropenic fever.  Patient was admitted with absolute neutrophil count of 0.9 and WBC of 2.2.  Patient has been on IV antibiotics.  Absolute neutrophil count has improved to 4.8, WBC 7.3 today, 11/19/2023.  Antibiotics will be discontinued.    Assessment/Plan:   Neutropenic fever:  -neutropenia has resolved. -WBC 7.3 today and absolute neutrophil count is 4.8. -Discontinue antibiotics. -Follow-up with primary care provider and oncology team on discharge.,  Stage IV lung adenocarcinoma with malignant right pleural effusion -Underwent thoracentesis on 11/13/2023 yielding 350 cc of clear yellow fluid. -Fluid analysis consistent with exudate however no different from previous thoracentesis in January -Antibiotics was discontinued today.   -Pleural fluid culture showed no growth  -Follow-up oncology as outpatient  Hyperthyroidism -Continue methimazole 20 mg p.o. daily  Hyperlipidemia -Continue Zocor 20 mg p.o. daily  GERD -Continue Protonix   Hypokalemia/hypomagnesemia: -Potassium is 2.7 today. -Magnesium is 1.2. --IV magnesium sulfate 4 g x 1 dose. -KCl 40 mEq every 4 hours x 2 doses. -Continue to monitor renal function and electrolytes.    Sinusitis -Start Flonase, Mucinex, Claritin  Medications     enoxaparin (LOVENOX) injection  40 mg Subcutaneous Q24H   fluticasone  1 spray Each Nare Daily   guaiFENesin  600 mg Oral BID   loratadine  10 mg Oral Daily   methimazole  20 mg Oral Daily   pantoprazole  40 mg Oral Daily   simvastatin  20 mg Oral QHS      Data Reviewed:   CBG:  Recent Labs  Lab 11/13/23 2039  GLUCAP 146*    SpO2: 96 %    Vitals:   11/18/23 2019 11/19/23 0452 11/19/23 0507 11/19/23 0756  BP: 136/75 129/84 111/74 126/76  Pulse: 91 95 100 97  Resp: 18  18 18   Temp: 98.2 F (36.8 C) 98 F (36.7 C) (!) 97.5 F (36.4 C) 98 F (36.7 C)  TempSrc: Oral Oral Oral Oral  SpO2: 98% 98% 97% 96%  Weight:      Height:          Data Reviewed:  Basic Metabolic Panel: Recent Labs  Lab 11/14/23 0627 11/15/23 0630 11/16/23 0630 11/17/23 0733 11/18/23 0735  NA 132* 132* 133* 132* 132*  K 3.3* 3.7 3.0* 2.8* 3.0*  CL 95* 95* 95* 97* 98  CO2 27 28 28 26 26   GLUCOSE 104* 120* 94 89 103*  BUN 5* <5* <5* <5* <5*  CREATININE 1.05* 1.09* 0.79 0.65 0.71  CALCIUM 7.8* 7.9* 7.7* 7.5* 7.8*  MG 1.2* 2.0 1.7 1.7  --   PHOS  --  2.9 3.2 3.2  --     CBC: Recent Labs  Lab 11/13/23 0624 11/14/23 0627 11/15/23 0630 11/16/23 0630 11/17/23 0733 11/18/23 0735  WBC 2.4* 2.8* 3.0* 3.1* 4.1 5.6  NEUTROABS 1.2* 1.2*  --  1.4* 2.2 3.7  HGB 8.0* 7.7* 8.1* 8.2* 7.8* 8.0*  HCT 24.0* 23.3* 24.0* 24.3* 23.8* 23.8*  MCV 81.4 80.6 80.5 81.0 81.5 80.4  PLT 307 414* 591* 677* 737* 770*    LFT Recent Labs  Lab 11/15/23 0630 11/16/23 0630 11/17/23 8119  11/18/23 0735  AST 14* 18 21 25   ALT 9 11 12 12   ALKPHOS 67 75 85 92  BILITOT 0.4 0.4 0.5 0.4  PROT 4.9* 5.1* 4.8* 4.8*  ALBUMIN 1.6* 1.7* 1.5* 1.7*     Antibiotics: Anti-infectives (From admission, onward)    Start     Dose/Rate Route Frequency Ordered Stop   11/13/23 1400  piperacillin-tazobactam (ZOSYN) IVPB 3.375 g        3.375 g 12.5 mL/hr over 240 Minutes Intravenous Every 8 hours 11/13/23 1047     11/12/23 0500  ceFEPIme (MAXIPIME) 2 g in sodium chloride 0.9 % 100 mL IVPB  Status:  Discontinued        2 g 200 mL/hr over 30 Minutes Intravenous Every 12 hours 11/11/23 2041 11/13/23 1035   11/11/23 1615  ceFEPIme (MAXIPIME) 2 g in sodium chloride 0.9 % 100  mL IVPB        2 g 200 mL/hr over 30 Minutes Intravenous  Once 11/11/23 1607 11/11/23 1752   11/11/23 1615  metroNIDAZOLE (FLAGYL) IVPB 500 mg        500 mg 100 mL/hr over 60 Minutes Intravenous  Once 11/11/23 1607 11/11/23 2042   11/11/23 1615  vancomycin (VANCOCIN) IVPB 1000 mg/200 mL premix        1,000 mg 200 mL/hr over 60 Minutes Intravenous  Once 11/11/23 1607 11/11/23 1913        DVT prophylaxis: Lovenox  Code Status: Full code  Family Communication: Discussed with daughter at bedside   CONSULTS    Subjective   No new complaints. No fever or chills.    Objective    Physical Examination: General condition: Patient is obese.  Not in any distress. HEENT: Patient is pale.  No jaundice. Lungs: Very decreased air entry right lung field. CVS: S1-S2. Abdomen: Obese, soft and nontender. Neuro: Awake and alert.  Moves all extremities. Extremities: No significant edema.  Status is: Inpatient:    Time spent: 35 minutes.  Barnetta Chapel   [facility] If 7PM-7AM, please contact night-coverage at [email or web address], Office  [phone]   11/19/2023, 8:36 AM  LOS: 8 days

## 2023-11-20 ENCOUNTER — Ambulatory Visit: Payer: MEDICARE

## 2023-11-20 ENCOUNTER — Encounter: Payer: MEDICARE | Admitting: Dietician

## 2023-11-20 ENCOUNTER — Ambulatory Visit: Payer: MEDICARE | Admitting: Physician Assistant

## 2023-11-20 ENCOUNTER — Other Ambulatory Visit: Payer: MEDICARE

## 2023-11-20 DIAGNOSIS — D709 Neutropenia, unspecified: Secondary | ICD-10-CM | POA: Diagnosis not present

## 2023-11-20 DIAGNOSIS — R5081 Fever presenting with conditions classified elsewhere: Secondary | ICD-10-CM | POA: Diagnosis not present

## 2023-11-20 LAB — CBC WITH DIFFERENTIAL/PLATELET
Abs Immature Granulocytes: 0.13 10*3/uL — ABNORMAL HIGH (ref 0.00–0.07)
Basophils Absolute: 0.1 10*3/uL (ref 0.0–0.1)
Basophils Relative: 1 %
Eosinophils Absolute: 0 10*3/uL (ref 0.0–0.5)
Eosinophils Relative: 0 %
HCT: 27.5 % — ABNORMAL LOW (ref 36.0–46.0)
Hemoglobin: 9.3 g/dL — ABNORMAL LOW (ref 12.0–15.0)
Immature Granulocytes: 2 %
Lymphocytes Relative: 22 %
Lymphs Abs: 1.9 10*3/uL (ref 0.7–4.0)
MCH: 26.9 pg (ref 26.0–34.0)
MCHC: 33.8 g/dL (ref 30.0–36.0)
MCV: 79.5 fL — ABNORMAL LOW (ref 80.0–100.0)
Monocytes Absolute: 0.7 10*3/uL (ref 0.1–1.0)
Monocytes Relative: 9 %
Neutro Abs: 5.5 10*3/uL (ref 1.7–7.7)
Neutrophils Relative %: 66 %
Platelets: 825 10*3/uL — ABNORMAL HIGH (ref 150–400)
RBC: 3.46 MIL/uL — ABNORMAL LOW (ref 3.87–5.11)
RDW: 17.1 % — ABNORMAL HIGH (ref 11.5–15.5)
WBC: 8.3 10*3/uL (ref 4.0–10.5)
nRBC: 0 % (ref 0.0–0.2)

## 2023-11-20 LAB — RENAL FUNCTION PANEL
Albumin: 1.7 g/dL — ABNORMAL LOW (ref 3.5–5.0)
Albumin: 1.8 g/dL — ABNORMAL LOW (ref 3.5–5.0)
Anion gap: 8 (ref 5–15)
Anion gap: 11 (ref 5–15)
BUN: <5 mg/dL — ABNORMAL LOW (ref 8–23)
BUN: <5 mg/dL — ABNORMAL LOW (ref 8–23)
CO2: 22 mmol/L (ref 22–32)
CO2: 23 mmol/L (ref 22–32)
Calcium: 7.3 mg/dL — ABNORMAL LOW (ref 8.9–10.3)
Calcium: 7.7 mg/dL — ABNORMAL LOW (ref 8.9–10.3)
Chloride: 94 mmol/L — ABNORMAL LOW (ref 98–111)
Chloride: 90 mmol/L — ABNORMAL LOW (ref 98–111)
Creatinine, Ser: 0.66 mg/dL (ref 0.44–1.00)
Creatinine, Ser: 0.56 mg/dL (ref 0.44–1.00)
GFR, Estimated: >60 mL/min (ref >60)
GFR, Estimated: >60 mL/min (ref >60)
Glucose, Bld: 80 mg/dL (ref 70–99)
Glucose, Bld: 98 mg/dL (ref 70–99)
Phosphorus: 2.9 mg/dL (ref 2.5–4.6)
Phosphorus: 2.9 mg/dL (ref 2.5–4.6)
Potassium: 3.4 mmol/L — ABNORMAL LOW (ref 3.5–5.1)
Potassium: 3 mmol/L — ABNORMAL LOW (ref 3.5–5.1)
Sodium: 124 mmol/L — ABNORMAL LOW (ref 135–145)
Sodium: 124 mmol/L — ABNORMAL LOW (ref 135–145)

## 2023-11-20 LAB — MAGNESIUM
Magnesium: 1.8 mg/dL (ref 1.7–2.4)
Magnesium: 1.7 mg/dL (ref 1.7–2.4)

## 2023-11-20 LAB — T4, FREE: Free T4: 0.7 ng/dL (ref 0.61–1.12)

## 2023-11-20 LAB — TSH: TSH: 7.744 u[IU]/mL — ABNORMAL HIGH (ref 0.350–4.500)

## 2023-11-20 LAB — OSMOLALITY: Osmolality: 267 mosm/kg — ABNORMAL LOW (ref 275–295)

## 2023-11-20 LAB — CORTISOL: Cortisol, Plasma: 15.8 ug/dL

## 2023-11-20 MED ORDER — MAGNESIUM SULFATE 2 GM/50ML IV SOLN
2.0000 g | Freq: Once | INTRAVENOUS | Status: AC
  Administered 2023-11-20: 2 g via INTRAVENOUS
  Filled 2023-11-20: qty 50

## 2023-11-20 MED ORDER — POTASSIUM CHLORIDE 20 MEQ PO PACK
40.0000 meq | PACK | ORAL | Status: AC
  Administered 2023-11-20 (×2): 40 meq via ORAL
  Filled 2023-11-20 (×2): qty 2

## 2023-11-20 NOTE — Plan of Care (Signed)
 Pt alert x4, reg diet, x1 assit rolling walker,

## 2023-11-20 NOTE — Progress Notes (Signed)
 Mobility Specialist: Progress Note   11/20/23 1542  Mobility  Activity Ambulated with assistance in room  Level of Assistance Contact guard assist, steadying assist  Assistive Device Front wheel walker  Distance Ambulated (ft) 30 ft  Activity Response Tolerated well  Mobility Referral Yes  Mobility visit 1 Mobility  Mobility Specialist Start Time (ACUTE ONLY) 1400  Mobility Specialist Stop Time (ACUTE ONLY) 1411  Mobility Specialist Time Calculation (min) (ACUTE ONLY) 11 min    Pt was agreeable to mobility session - received in bed. Feeling a little better this afternoon. SV for bed mobility and STS, CG for ambulation. Left in bed with all needs met, call bell in reach.   Maurene Capes Mobility Specialist Please contact via SecureChat or Rehab office at [phone]

## 2023-11-20 NOTE — Plan of Care (Signed)

## 2023-11-20 NOTE — Progress Notes (Signed)
 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  Patient was admitted with neutropenic fever.  Patient was admitted with absolute neutrophil count of 0.9 and WBC of 2.2.  Patient has been on IV antibiotics.  Absolute neutrophil count has improved to 4.8, WBC 7.3 today, 11/19/2023.  Antibiotics will be discontinued.  11/20/2023: Neutropenia has resolved.  WBC is 8.3 today.  Electrolyte abnormalities noted.  Potassium of 3 and sodium of 124.  KCl 40 mEq Q4 hourly x 2 doses.  Magnesium of 1.7, will give 2 g of magnesium x 1 dose.  Will workup hyponatremia.  Patient has lung cancer stage IV.   Assessment/Plan:   Neutropenic fever:  -neutropenia has resolved. -WBC 8.3 today and absolute neutrophil count is 5.5.   -Discontinued antibiotics 11/19/2023. -Follow-up with primary care provider and oncology team on discharge.,  Stage IV lung adenocarcinoma with malignant right pleural effusion -Underwent thoracentesis on 11/13/2023 yielding 350 cc of clear yellow fluid. -Fluid analysis consistent with exudate however no different from previous thoracentesis in January -Antibiotics was discontinued today.   -Pleural fluid culture showed no growth  -Follow-up oncology as outpatient  Hyperthyroidism -Continue methimazole 20 mg p.o. daily  Hyperlipidemia -Continue Zocor 20 mg p.o. daily  GERD -Continue Protonix   Hypokalemia/hypomagnesemia: -Potassium is 3.3 today. Magnesium of 1.7. KCl 40 mEq Q4 hourly x 2 doses. -Magnesium 2 g x 1 dose. Continue to monitor renal function and electrolytes.  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 -Check urine sodium, urine and serum osmolality, cortisol level, TSH and free T4. -Further management depend on hospital course.  Sinusitis -Start Flonase, Mucinex, Claritin  Medications     enoxaparin (LOVENOX) injection  40 mg Subcutaneous Q24H   fluticasone  1 spray Each Nare Daily   guaiFENesin  600 mg Oral BID   loratadine  10 mg Oral Daily   methimazole  20 mg Oral Daily   pantoprazole  40 mg Oral Daily   simvastatin  20 mg Oral QHS     Data Reviewed:   CBG:  Recent Labs  Lab 11/13/23 2039  GLUCAP 146*    SpO2: 97 %    Vitals:   11/19/23 1620 11/19/23 2036 11/20/23 0449 11/20/23 0815  BP: 121/63 130/71 127/81 (!) 141/84  Pulse: 92 91 96 95  Resp: 18 18 18 20   Temp: 97.8 F (36.6 C) 97.7 F (36.5 C) 97.7 F (36.5 C) 97.9 F (36.6 C)  TempSrc: Oral Oral Oral Oral  SpO2: 94% 97% 100% 97%  Weight:      Height:          Data Reviewed:  Basic Metabolic Panel: Recent Labs  Lab 11/14/23 0627 11/15/23 0630 11/16/23 0630 11/17/23 0733 11/18/23 0735 11/19/23 0837  NA 132* 132* 133* 132* 132* 127*  K 3.3* 3.7 3.0* 2.8* 3.0* 2.7*  CL 95* 95* 95* 97* 98 93*  CO2 27 28 28 26 26 26   GLUCOSE 104* 120* 94 89 103* 100*  BUN 5* <5* <5* <5* <5* <5*  CREATININE 1.05* 1.09* 0.79 0.65 0.71 0.69  CALCIUM 7.8* 7.9* 7.7* 7.5* 7.8* 7.8*  MG 1.2* 2.0 1.7 1.7  --  1.2*  PHOS  --  2.9 3.2 3.2  --  3.3    CBC: Recent Labs  Lab 11/14/23 0627 11/15/23 0630 11/16/23 0630 11/17/23 0733 11/18/23 0735 11/19/23 0803  WBC 2.8* 3.0* 3.1* 4.1 5.6 7.3  NEUTROABS 1.2*  --  1.4* 2.2 3.7 4.8  HGB 7.7* 8.1* 8.2* 7.8* 8.0* 8.8*  HCT 23.3* 24.0* 24.3* 23.8* 23.8* 25.8*  MCV 80.6 80.5 81.0 81.5 80.4 79.6*  PLT 414* 591* 677* 737* 770* 850*    LFT Recent Labs  Lab 11/15/23 0630 11/16/23 0630 11/17/23 0733 11/18/23 0735 11/19/23 0837  AST 14* 18 21 25   --   ALT 9 11 12 12   --   ALKPHOS 67 75 85 92  --   BILITOT 0.4 0.4 0.5 0.4  --   PROT 4.9*  5.1* 4.8* 4.8*  --   ALBUMIN 1.6* 1.7* 1.5* 1.7* 1.7*     Antibiotics: Anti-infectives (From admission, onward)    Start     Dose/Rate Route Frequency Ordered Stop   11/13/23 1400  piperacillin-tazobactam (ZOSYN) IVPB 3.375 g  Status:  Discontinued        3.375 g 12.5 mL/hr over 240 Minutes Intravenous Every 8 hours 11/13/23 1047 11/19/23 0855   11/12/23 0500  ceFEPIme (MAXIPIME) 2 g in sodium chloride 0.9 % 100 mL IVPB  Status:  Discontinued        2 g 200 mL/hr over 30 Minutes Intravenous Every 12 hours 11/11/23 2041 11/13/23 1035   11/11/23 1615  ceFEPIme (MAXIPIME) 2 g in sodium chloride 0.9 % 100 mL IVPB        2 g 200 mL/hr over 30 Minutes Intravenous  Once 11/11/23 1607 11/11/23 1752   11/11/23 1615  metroNIDAZOLE (FLAGYL) IVPB 500 mg        500 mg 100 mL/hr over 60 Minutes Intravenous  Once 11/11/23 1607 11/11/23 2042   11/11/23 1615  vancomycin (VANCOCIN) IVPB 1000 mg/200 mL premix        1,000 mg 200 mL/hr over 60 Minutes Intravenous  Once 11/11/23 1607 11/11/23 1913        DVT prophylaxis: Lovenox  Code Status: Full code  Family Communication: Discussed with daughter at bedside   CONSULTS    Subjective   No new complaints. No fever or chills.    Objective    Physical Examination: General condition: Patient is obese.  Not in any distress. HEENT: Patient is pale.  No jaundice. Lungs: Very decreased air entry right lung field. CVS: S1-S2. Abdomen: Obese, soft and nontender. Neuro: Awake and alert.  Moves all extremities. Extremities: No significant edema.  Status is: Inpatient:    Time spent: 35 minutes.  Barnetta Chapel   [facility] If 7PM-7AM, please contact night-coverage at [email or web address], Office  [phone]   11/20/2023, 9:53 AM  LOS: 9 days

## 2023-11-20 NOTE — Care Management Important Message (Signed)
 Important Message  Patient Details  Name: Amanda Potter MRN: [id number] Date of Birth: 1946/03/05   Important Message Given:  Yes - Medicare IM     Dorena Bodo 11/20/2023, 1:43 PM

## 2023-11-21 DIAGNOSIS — E876 Hypokalemia: Secondary | ICD-10-CM | POA: Diagnosis not present

## 2023-11-21 DIAGNOSIS — D709 Neutropenia, unspecified: Secondary | ICD-10-CM | POA: Diagnosis not present

## 2023-11-21 DIAGNOSIS — C3491 Malignant neoplasm of unspecified part of right bronchus or lung: Secondary | ICD-10-CM | POA: Diagnosis not present

## 2023-11-21 DIAGNOSIS — R5081 Fever presenting with conditions classified elsewhere: Secondary | ICD-10-CM | POA: Diagnosis not present

## 2023-11-21 LAB — BASIC METABOLIC PANEL
Anion gap: 10 (ref 5–15)
Anion gap: 14 (ref 5–15)
BUN: <5 mg/dL — ABNORMAL LOW (ref 8–23)
BUN: <5 mg/dL — ABNORMAL LOW (ref 8–23)
CO2: 21 mmol/L — ABNORMAL LOW (ref 22–32)
CO2: 21 mmol/L — ABNORMAL LOW (ref 22–32)
Calcium: 7.6 mg/dL — ABNORMAL LOW (ref 8.9–10.3)
Calcium: 7.8 mg/dL — ABNORMAL LOW (ref 8.9–10.3)
Chloride: 90 mmol/L — ABNORMAL LOW (ref 98–111)
Chloride: 85 mmol/L — ABNORMAL LOW (ref 98–111)
Creatinine, Ser: 0.62 mg/dL (ref 0.44–1.00)
Creatinine, Ser: 0.62 mg/dL (ref 0.44–1.00)
GFR, Estimated: >60 mL/min (ref >60)
GFR, Estimated: >60 mL/min (ref >60)
Glucose, Bld: 139 mg/dL — ABNORMAL HIGH (ref 70–99)
Glucose, Bld: 122 mg/dL — ABNORMAL HIGH (ref 70–99)
Potassium: 3.3 mmol/L — ABNORMAL LOW (ref 3.5–5.1)
Potassium: 3.7 mmol/L (ref 3.5–5.1)
Sodium: 121 mmol/L — ABNORMAL LOW (ref 135–145)
Sodium: 120 mmol/L — ABNORMAL LOW (ref 135–145)

## 2023-11-21 LAB — MAGNESIUM: Magnesium: 1.5 mg/dL — ABNORMAL LOW (ref 1.7–2.4)

## 2023-11-21 MED ORDER — POTASSIUM CHLORIDE 20 MEQ PO PACK
40.0000 meq | PACK | Freq: Once | ORAL | Status: AC
  Administered 2023-11-21: 40 meq via ORAL
  Filled 2023-11-21: qty 2

## 2023-11-21 MED ORDER — MAGNESIUM SULFATE 2 GM/50ML IV SOLN
2.0000 g | Freq: Once | INTRAVENOUS | Status: AC
  Administered 2023-11-21: 2 g via INTRAVENOUS
  Filled 2023-11-21: qty 50

## 2023-11-21 MED ORDER — SODIUM CHLORIDE 0.9 % IV SOLN: INTRAVENOUS | Status: DC

## 2023-11-21 NOTE — Progress Notes (Signed)
 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  Patient was admitted with neutropenic fever.  Patient was admitted with absolute neutrophil count of 0.9 and WBC of 2.2.  Patient has been on IV antibiotics.  ANC normalized and abx were completed.  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 - trial of IVF  - repeat BMP this evening and in am  Neutropenic fever:  -neutropenia has resolved. -Follow-up with primary care provider and oncology team on discharge.,   Stage IV lung adenocarcinoma with malignant right pleural effusion -Underwent thoracentesis on 11/13/2023 yielding 350 cc of clear yellow fluid. -Fluid analysis consistent with exudate however no different from previous thoracentesis in January -Antibiotics discontinued  -Pleural fluid culture showed no growth  -Follow-up oncology as outpatient   Hyperthyroidism -Continue methimazole 20 mg p.o. daily   Hyperlipidemia -Continue Zocor 20 mg p.o. daily   GERD -Continue Protonix   Sinusitis -Start Flonase, Mucinex, Claritin  Interval History:  No events overnight.  Resting comfortably in bed.  Reviewed lab findings and we will continue trending sodium again overnight.  Remains asymptomatic.   Old records reviewed in assessment of this patient  Antimicrobials:   DVT prophylaxis:  enoxaparin (LOVENOX) injection 40 mg Start: 11/11/23 2200   Code Status:   Code Status: Full Code  Mobility Assessment (Last 72 Hours)     Mobility Assessment     Row  Name 11/21/23 1208 11/21/23 1000 11/20/23 1930 11/20/23 0841 11/19/23 1957   Does patient have an order for bedrest or is patient medically unstable -- No - Continue assessment No - Continue assessment No - Continue assessment No - Continue assessment   What is the highest level of mobility based on the progressive mobility assessment? Level 5 (Walks with assist in room/hall) - Balance while stepping forward/back and can walk in room with assist - Complete Level 5 (Walks with assist in room/hall) - Balance while stepping forward/back and can walk in room with assist - Complete Level 5 (Walks with assist in room/hall) - Balance while stepping forward/back and can walk in room with assist - Complete Level 5 (Walks with assist in room/hall) - Balance while stepping forward/back and can walk in room with assist - Complete Level 5 (Walks with assist in room/hall) - Balance while stepping forward/back and can walk in room with assist - Complete   Is the above level different from baseline mobility prior to current illness? -- Yes - Recommend PT order Yes - Recommend PT order Yes - Recommend PT order Yes - Recommend PT order    Row Name 11/19/23 1439 11/19/23 0900 11/19/23 0815 11/19/23 0730 11/18/23 1959   Does patient have an order for bedrest or is patient medically unstable -- No - Continue assessment -- No - Continue assessment No - Continue assessment   What is the highest level of mobility based on the progressive mobility assessment? Level 5 (Walks with assist in room/hall) - Balance while stepping forward/back and can walk in room with assist - Complete -- Level 5 (Walks with assist in room/hall) - Balance while stepping  forward/back and can walk in room with assist - Complete Level 5 (Walks with assist in room/hall) - Balance while stepping forward/back and can walk in room with assist - Complete Level 5 (Walks with assist in room/hall) - Balance while stepping forward/back and can walk in room with assist  - Complete   Is the above level different from baseline mobility prior to current illness? -- -- -- Yes - Recommend PT order Yes - Recommend PT order            Barriers to discharge: None Disposition Plan:  Home  HH orders placed:  Status is: Inpt  Objective: Blood pressure 138/79, pulse 95, temperature 97.6 F (36.4 C), temperature source Oral, resp. rate 18, height 5\' 6"  (1.676 m), weight 70.6 kg, SpO2 99%.  Examination:  Physical Exam Constitutional:      Appearance: Normal appearance.  HENT:     Head: Normocephalic and atraumatic.     Mouth/Throat:     Mouth: Mucous membranes are moist.  Eyes:     Extraocular Movements: Extraocular movements intact.  Cardiovascular:     Rate and Rhythm: Normal rate and regular rhythm.  Pulmonary:     Effort: Pulmonary effort is normal. No respiratory distress.     Breath sounds: Normal breath sounds. No wheezing.  Abdominal:     General: Bowel sounds are normal. There is no distension.     Palpations: Abdomen is soft.     Tenderness: There is no abdominal tenderness.  Musculoskeletal:        General: Normal range of motion.     Cervical back: Normal range of motion and neck supple.  Skin:    General: Skin is warm and dry.  Neurological:     General: No focal deficit present.     Mental Status: She is alert.  Psychiatric:        Mood and Affect: Mood normal.      Consultants:    Procedures:    Data Reviewed: Results for orders placed or performed during the hospital encounter of 11/11/23 (from the past 24 hours)  Basic metabolic panel     Status: Abnormal   Collection Time: 11/21/23  9:11 AM  Result Value Ref Range   Sodium 121 (L) 135 - 145 mmol/L   Potassium 3.3 (L) 3.5 - 5.1 mmol/L   Chloride 90 (L) 98 - 111 mmol/L   CO2 21 (L) 22 - 32 mmol/L   Glucose, Bld 139 (H) 70 - 99 mg/dL   BUN <5 (L) 8 - 23 mg/dL   Creatinine, Ser 4.09 0.44 - 1.00 mg/dL   Calcium 7.6 (L) 8.9 - 10.3 mg/dL   GFR, Estimated >81 >19  mL/min   Anion gap 10 5 - 15  Magnesium     Status: Abnormal   Collection Time: 11/21/23  9:11 AM  Result Value Ref Range   Magnesium 1.5 (L) 1.7 - 2.4 mg/dL    I have reviewed pertinent nursing notes, vitals, labs, and images as necessary. I have ordered labwork to follow up on as indicated.  I have reviewed the last notes from staff over past 24 hours. I have discussed patient's care plan and test results with nursing staff, CM/SW, and other staff as appropriate.  Time spent: Greater than 50% of the 55 minute visit was spent in counseling/coordination of care for the patient as laid out in the A&P.   LOS: 10 days   Lewie Chamber, MD [facility] 11/21/2023, 5:44 PM

## 2023-11-21 NOTE — Progress Notes (Signed)
 Physical Therapy Treatment Patient Details Name: Amanda Potter MRN: [id number] DOB: 07-14-1946 Today's Date: 11/21/2023   History of Present Illness Pt is a 78 y/o F admitted on 11/11/23 after presenting for evaluation of a fever. Pt found to be neutropenic. 2/18 Rt thoracentesis  PMH: recently diagnosed stage IV lung adenocarcinoma with malignant R pleural effusion, hyperthyroidism, HLD, GERD    PT Comments  Pt making gradual progress but does fatigue easily.  She ambulated 11' in hallway and performed bathroom adls but then needed back to bed.  VSS on RA.  Pt has support at home.  Cont POC with rec for HHPT.     If plan is discharge home, recommend the following: A little help with walking and/or transfers;A little help with bathing/dressing/bathroom;Assistance with cooking/housework;Assist for transportation;Help with stairs or ramp for entrance   Can travel by private vehicle        Equipment Recommendations  None recommended by PT    Recommendations for Other Services       Precautions / Restrictions Precautions Precautions: Fall     Mobility  Bed Mobility Overal bed mobility: Needs Assistance Bed Mobility: Supine to Sit, Sit to Supine     Supine to sit: Supervision Sit to supine: Supervision        Transfers Overall transfer level: Needs assistance Equipment used: Rolling walker (2 wheels) Transfers: Sit to/from Stand Sit to Stand: Supervision           General transfer comment: STS from bed and toilet with close supervision.  Performed toielting ADLs independently    Ambulation/Gait Ambulation/Gait assistance: Contact guard assist Gait Distance (Feet): 70 Feet Assistive device: Rolling walker (2 wheels), None Gait Pattern/deviations: Decreased stride length, Trunk flexed, Step-through pattern Gait velocity: decreased     General Gait Details: Cues for RW proximity and posture; steady with RW; fatigued easily; did take a few steps from toilet to sink  without RW   Stairs             Wheelchair Mobility     Tilt Bed    Modified Rankin (Stroke Patients Only)       Balance Overall balance assessment: Needs assistance Sitting-balance support: No upper extremity supported, Feet supported Sitting balance-Leahy Scale: Good     Standing balance support: No upper extremity supported, During functional activity, Bilateral upper extremity supported Standing balance-Leahy Scale: Fair Standing balance comment: RW for ambulation , performed washing hands and a couple steps without RW                            Communication    Cognition Arousal: Alert Behavior During Therapy: WFL for tasks assessed/performed   PT - Cognitive impairments: No apparent impairments                                Cueing    Exercises      General Comments General comments (skin integrity, edema, etc.): Declined further exercise - fatigued.  VSS on RA      Pertinent Vitals/Pain Pain Assessment Pain Assessment: No/denies pain    Home Living                          Prior Function            PT Goals (current goals can now be found in the care plan section)  Progress towards PT goals: Progressing toward goals    Frequency    Min 1X/week      PT Plan      Co-evaluation              AM-PAC PT "6 Clicks" Mobility   Outcome Measure  Help needed turning from your back to your side while in a flat bed without using bedrails?: A Little Help needed moving from lying on your back to sitting on the side of a flat bed without using bedrails?: A Little Help needed moving to and from a bed to a chair (including a wheelchair)?: A Little Help needed standing up from a chair using your arms (e.g., wheelchair or bedside chair)?: A Little Help needed to walk in hospital room?: A Little Help needed climbing 3-5 steps with a railing? : A Little 6 Click Score: 18    End of Session Equipment Utilized  During Treatment: Gait belt Activity Tolerance: Patient limited by fatigue Patient left: in bed;with call bell/phone within reach;with bed alarm set Nurse Communication: Mobility status PT Visit Diagnosis: Muscle weakness (generalized) (M62.81);Other abnormalities of gait and mobility (R26.89);Unsteadiness on feet (R26.81)     Time: 1030-1046 PT Time Calculation (min) (ACUTE ONLY): 16 min  Charges:    $Gait Training: 8-22 mins PT General Charges $$ ACUTE PT VISIT: 1 Visit                     Anise Salvo, PT Acute Rehab Services [facility] [phone]    Rayetta Humphrey 11/21/2023, 12:09 PM

## 2023-11-21 NOTE — Plan of Care (Signed)

## 2023-11-22 DIAGNOSIS — E871 Hypo-osmolality and hyponatremia: Secondary | ICD-10-CM

## 2023-11-22 DIAGNOSIS — D709 Neutropenia, unspecified: Secondary | ICD-10-CM | POA: Diagnosis not present

## 2023-11-22 DIAGNOSIS — C3491 Malignant neoplasm of unspecified part of right bronchus or lung: Secondary | ICD-10-CM | POA: Diagnosis not present

## 2023-11-22 DIAGNOSIS — E876 Hypokalemia: Secondary | ICD-10-CM | POA: Diagnosis not present

## 2023-11-22 LAB — BASIC METABOLIC PANEL
Anion gap: 9 (ref 5–15)
Anion gap: 8 (ref 5–15)
Anion gap: 9 (ref 5–15)
BUN: <5 mg/dL — ABNORMAL LOW (ref 8–23)
BUN: <5 mg/dL — ABNORMAL LOW (ref 8–23)
BUN: <5 mg/dL — ABNORMAL LOW (ref 8–23)
CO2: 24 mmol/L (ref 22–32)
CO2: 23 mmol/L (ref 22–32)
CO2: 24 mmol/L (ref 22–32)
Calcium: 7.7 mg/dL — ABNORMAL LOW (ref 8.9–10.3)
Calcium: 7.4 mg/dL — ABNORMAL LOW (ref 8.9–10.3)
Calcium: 7.6 mg/dL — ABNORMAL LOW (ref 8.9–10.3)
Chloride: 85 mmol/L — ABNORMAL LOW (ref 98–111)
Chloride: 86 mmol/L — ABNORMAL LOW (ref 98–111)
Chloride: 88 mmol/L — ABNORMAL LOW (ref 98–111)
Creatinine, Ser: 0.5 mg/dL (ref 0.44–1.00)
Creatinine, Ser: 0.46 mg/dL (ref 0.44–1.00)
Creatinine, Ser: 0.51 mg/dL (ref 0.44–1.00)
GFR, Estimated: >60 mL/min (ref >60)
GFR, Estimated: >60 mL/min (ref >60)
GFR, Estimated: >60 mL/min (ref >60)
Glucose, Bld: 106 mg/dL — ABNORMAL HIGH (ref 70–99)
Glucose, Bld: 96 mg/dL (ref 70–99)
Glucose, Bld: 85 mg/dL (ref 70–99)
Potassium: 3.4 mmol/L — ABNORMAL LOW (ref 3.5–5.1)
Potassium: 3.5 mmol/L (ref 3.5–5.1)
Potassium: 4.7 mmol/L (ref 3.5–5.1)
Sodium: 118 mmol/L — CL (ref 135–145)
Sodium: 117 mmol/L — CL (ref 135–145)
Sodium: 121 mmol/L — ABNORMAL LOW (ref 135–145)

## 2023-11-22 LAB — CBC WITH DIFFERENTIAL/PLATELET
Abs Immature Granulocytes: 0.12 10*3/uL — ABNORMAL HIGH (ref 0.00–0.07)
Basophils Absolute: 0 10*3/uL (ref 0.0–0.1)
Basophils Relative: 0 %
Eosinophils Absolute: 0 10*3/uL (ref 0.0–0.5)
Eosinophils Relative: 0 %
HCT: 25.9 % — ABNORMAL LOW (ref 36.0–46.0)
Hemoglobin: 8.9 g/dL — ABNORMAL LOW (ref 12.0–15.0)
Immature Granulocytes: 1 %
Lymphocytes Relative: 16 %
Lymphs Abs: 1.7 10*3/uL (ref 0.7–4.0)
MCH: 26.8 pg (ref 26.0–34.0)
MCHC: 34.4 g/dL (ref 30.0–36.0)
MCV: 78 fL — ABNORMAL LOW (ref 80.0–100.0)
Monocytes Absolute: 0.8 10*3/uL (ref 0.1–1.0)
Monocytes Relative: 7 %
Neutro Abs: 8.1 10*3/uL — ABNORMAL HIGH (ref 1.7–7.7)
Neutrophils Relative %: 76 %
Platelets: 643 10*3/uL — ABNORMAL HIGH (ref 150–400)
RBC: 3.32 MIL/uL — ABNORMAL LOW (ref 3.87–5.11)
RDW: 17.2 % — ABNORMAL HIGH (ref 11.5–15.5)
WBC: 10.9 10*3/uL — ABNORMAL HIGH (ref 4.0–10.5)
nRBC: 0 % (ref 0.0–0.2)

## 2023-11-22 LAB — MAGNESIUM: Magnesium: 1.6 mg/dL — ABNORMAL LOW (ref 1.7–2.4)

## 2023-11-22 MED ORDER — MAGNESIUM SULFATE 2 GM/50ML IV SOLN
2.0000 g | Freq: Once | INTRAVENOUS | Status: AC
  Administered 2023-11-22: 2 g via INTRAVENOUS
  Filled 2023-11-22: qty 50

## 2023-11-22 MED ORDER — SODIUM CHLORIDE 3 % IV SOLN
600.0000 mL/h | Freq: Once | INTRAVENOUS | Status: AC
  Administered 2023-11-22: 76 mL/h via INTRAVENOUS
  Filled 2023-11-22: qty 500

## 2023-11-22 MED ORDER — POTASSIUM CHLORIDE 10 MEQ/100ML IV SOLN
10.0000 meq | INTRAVENOUS | Status: AC
  Administered 2023-11-22 (×4): 10 meq via INTRAVENOUS
  Filled 2023-11-22 (×4): qty 100

## 2023-11-22 MED ORDER — CHLORHEXIDINE GLUCONATE CLOTH 2 % EX PADS
6.0000 | MEDICATED_PAD | Freq: Every day | CUTANEOUS | Status: DC
  Administered 2023-11-23 – 2023-11-26 (×3): 6 via TOPICAL

## 2023-11-22 MED ORDER — POTASSIUM CHLORIDE CRYS ER 20 MEQ PO TBCR
40.0000 meq | EXTENDED_RELEASE_TABLET | Freq: Once | ORAL | Status: AC
  Administered 2023-11-22: 40 meq via ORAL
  Filled 2023-11-22: qty 2

## 2023-11-22 MED ORDER — SODIUM CHLORIDE 1 G PO TABS
1.0000 g | ORAL_TABLET | Freq: Three times a day (TID) | ORAL | Status: DC
  Administered 2023-11-22 (×3): 1 g via ORAL
  Filled 2023-11-22 (×4): qty 1

## 2023-11-22 MED ORDER — SODIUM CHLORIDE 3 % IV SOLN
INTRAVENOUS | Status: AC
  Filled 2023-11-22: qty 500

## 2023-11-22 MED ORDER — ORAL CARE MOUTH RINSE: 15.0000 mL | OROMUCOSAL | Status: DC | PRN

## 2023-11-22 NOTE — Progress Notes (Signed)
 Pt sod came back, lab called, sodium level 118... Dr Frederick Peers was notified and new orders given. Continued to monitor.....Marland Kitchen at 1332 sod was 117... Dr. Truitt Merle, new orders given and transfer to icu

## 2023-11-22 NOTE — Progress Notes (Signed)
 Report called to icu/ awaiting to transfer

## 2023-11-22 NOTE — Progress Notes (Signed)
 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  Patient was admitted with neutropenic fever.  Patient was admitted with absolute neutrophil count of 0.9 and WBC of 2.2.  Patient has been on IV antibiotics.  ANC normalized and abx were completed.  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 -Follow-up with primary care provider and oncology team on discharge.,   Stage IV lung adenocarcinoma with malignant right pleural effusion -Underwent thoracentesis on 11/13/2023 yielding 350 cc of clear yellow fluid. -Fluid analysis consistent with exudate however no different from previous thoracentesis in January -Antibiotics discontinued  -Pleural fluid culture showed no growth  -Follow-up oncology as outpatient   Hyperthyroidism -Continue methimazole 20 mg p.o. daily   Hyperlipidemia -Continue Zocor 20 mg p.o. daily   GERD -Continue Protonix   Sinusitis -Start Flonase, Mucinex, Claritin  Interval History:  No events overnight.  More lethargic appearing this morning.  Daughter present bedside.  Did not eat the greatest since yesterday  either and barely ate any breakfast.   Old records reviewed in assessment of this patient  Antimicrobials:   DVT prophylaxis:  enoxaparin (LOVENOX) injection 40 mg Start: 11/11/23 2200   Code Status:   Code Status: Full Code  Mobility Assessment (Last 72 Hours)     Mobility Assessment     Row Name 11/22/23 1100 11/22/23 0700 11/21/23 1931 11/21/23 1208 11/21/23 1000   Does patient have an order for bedrest or is patient medically unstable No - Continue assessment No - Continue assessment No - Continue assessment -- No - Continue assessment   What is the highest level of mobility based on the progressive mobility assessment? Level 5 (Walks with assist in room/hall) - Balance while stepping forward/back and can walk in room with assist - Complete Level 5 (Walks with assist in room/hall) - Balance while stepping forward/back and can walk in room with assist - Complete Level 5 (Walks with assist in room/hall) - Balance while stepping forward/back and can walk in room with assist - Complete Level 5 (Walks with assist in room/hall) - Balance while stepping forward/back and can walk in room with assist - Complete Level 5 (Walks with assist in room/hall) - Balance while stepping forward/back and can walk in room with assist - Complete   Is the above level different from baseline mobility prior to current illness? Yes - Recommend PT order Yes - Recommend PT order Yes - Recommend PT order -- Yes - Recommend PT order    Row Name 11/20/23 1930 11/20/23 0841 11/19/23 1957       Does patient have an order for bedrest or is patient medically unstable No - Continue assessment No - Continue assessment No - Continue assessment  What is the highest level of mobility based on the progressive mobility assessment? Level 5 (Walks with assist in room/hall) - Balance while stepping forward/back and can walk in room with assist - Complete Level 5 (Walks with assist in room/hall) - Balance while stepping  forward/back and can walk in room with assist - Complete Level 5 (Walks with assist in room/hall) - Balance while stepping forward/back and can walk in room with assist - Complete     Is the above level different from baseline mobility prior to current illness? Yes - Recommend PT order Yes - Recommend PT order Yes - Recommend PT order              Barriers to discharge: None Disposition Plan:  Home  HH orders placed:  Status is: Inpt  Objective: Blood pressure (!) 129/92, pulse (!) 103, temperature (!) 97.5 F (36.4 C), temperature source Oral, resp. rate (!) 22, height 5\' 6"  (1.676 m), weight 70.6 kg, SpO2 98%.  Examination:  Physical Exam Constitutional:      Comments: Much more lethargic  HENT:     Head: Normocephalic and atraumatic.     Mouth/Throat:     Mouth: Mucous membranes are moist.  Eyes:     Extraocular Movements: Extraocular movements intact.  Cardiovascular:     Rate and Rhythm: Normal rate and regular rhythm.  Pulmonary:     Effort: Pulmonary effort is normal. No respiratory distress.     Breath sounds: Normal breath sounds. No wheezing.  Abdominal:     General: Bowel sounds are normal. There is no distension.     Palpations: Abdomen is soft.     Tenderness: There is no abdominal tenderness.  Musculoskeletal:        General: Normal range of motion.     Cervical back: Normal range of motion and neck supple.  Skin:    General: Skin is warm and dry.  Neurological:     General: No focal deficit present.  Psychiatric:        Mood and Affect: Mood normal.      Consultants:    Procedures:    Data Reviewed: Results for orders placed or performed during the hospital encounter of 11/11/23 (from the past 24 hours)  Basic metabolic panel     Status: Abnormal   Collection Time: 11/21/23  6:31 PM  Result Value Ref Range   Sodium 120 (L) 135 - 145 mmol/L   Potassium 3.7 3.5 - 5.1 mmol/L   Chloride 85 (L) 98 - 111 mmol/L   CO2 21 (L) 22 - 32 mmol/L    Glucose, Bld 122 (H) 70 - 99 mg/dL   BUN <5 (L) 8 - 23 mg/dL   Creatinine, Ser 6.57 0.44 - 1.00 mg/dL   Calcium 7.8 (L) 8.9 - 10.3 mg/dL   GFR, Estimated >84 >69 mL/min   Anion gap 14 5 - 15  Basic metabolic panel     Status: Abnormal   Collection Time: 11/22/23  6:51 AM  Result Value Ref Range   Sodium 118 (LL) 135 - 145 mmol/L   Potassium 3.4 (L) 3.5 - 5.1 mmol/L   Chloride 85 (L) 98 - 111 mmol/L   CO2 24 22 - 32 mmol/L   Glucose, Bld 106 (H) 70 - 99 mg/dL   BUN <5 (L) 8 - 23 mg/dL   Creatinine, Ser 6.29 0.44 - 1.00 mg/dL   Calcium 7.7 (L) 8.9 - 10.3 mg/dL   GFR, Estimated >52 >84 mL/min  Anion gap 9 5 - 15  CBC with Differential/Platelet     Status: Abnormal   Collection Time: 11/22/23  6:51 AM  Result Value Ref Range   WBC 10.9 (H) 4.0 - 10.5 K/uL   RBC 3.32 (L) 3.87 - 5.11 MIL/uL   Hemoglobin 8.9 (L) 12.0 - 15.0 g/dL   HCT 16.1 (L) 09.6 - 04.5 %   MCV 78.0 (L) 80.0 - 100.0 fL   MCH 26.8 26.0 - 34.0 pg   MCHC 34.4 30.0 - 36.0 g/dL   RDW 40.9 (H) 81.1 - 91.4 %   Platelets 643 (H) 150 - 400 K/uL   nRBC 0.0 0.0 - 0.2 %   Neutrophils Relative % 76 %   Neutro Abs 8.1 (H) 1.7 - 7.7 K/uL   Lymphocytes Relative 16 %   Lymphs Abs 1.7 0.7 - 4.0 K/uL   Monocytes Relative 7 %   Monocytes Absolute 0.8 0.1 - 1.0 K/uL   Eosinophils Relative 0 %   Eosinophils Absolute 0.0 0.0 - 0.5 K/uL   Basophils Relative 0 %   Basophils Absolute 0.0 0.0 - 0.1 K/uL   Immature Granulocytes 1 %   Abs Immature Granulocytes 0.12 (H) 0.00 - 0.07 K/uL  Magnesium     Status: Abnormal   Collection Time: 11/22/23  6:51 AM  Result Value Ref Range   Magnesium 1.6 (L) 1.7 - 2.4 mg/dL  Basic metabolic panel     Status: Abnormal   Collection Time: 11/22/23 12:03 PM  Result Value Ref Range   Sodium 117 (LL) 135 - 145 mmol/L   Potassium 3.5 3.5 - 5.1 mmol/L   Chloride 86 (L) 98 - 111 mmol/L   CO2 23 22 - 32 mmol/L   Glucose, Bld 96 70 - 99 mg/dL   BUN <5 (L) 8 - 23 mg/dL   Creatinine, Ser 7.82 0.44 -  1.00 mg/dL   Calcium 7.4 (L) 8.9 - 10.3 mg/dL   GFR, Estimated >95 >62 mL/min   Anion gap 8 5 - 15    I have reviewed pertinent nursing notes, vitals, labs, and images as necessary. I have ordered labwork to follow up on as indicated.  I have reviewed the last notes from staff over past 24 hours. I have discussed patient's care plan and test results with nursing staff, CM/SW, and other staff as appropriate.  Critical care time to evaluate and treat this patient was 55 minutes.  Independent of separate billable services  This patient is critically ill with some or all of the following life-threatening issues requiring my presence at the bedside: Hemodynamic instability requiring titration of medications Oxygenation/ventilation instability requiring frequent modifications of support Cardiac rhythm disturbances requiring evaluation and/or interventions Fluctuations in neurologic function requiring evaluation and/or interventions and/or fluid/volume titration    LOS: 11 days   Lewie Chamber, MD [facility] 11/22/2023, 3:19 PM

## 2023-11-23 DIAGNOSIS — E871 Hypo-osmolality and hyponatremia: Secondary | ICD-10-CM | POA: Diagnosis not present

## 2023-11-23 DIAGNOSIS — C3491 Malignant neoplasm of unspecified part of right bronchus or lung: Secondary | ICD-10-CM | POA: Diagnosis not present

## 2023-11-23 DIAGNOSIS — D709 Neutropenia, unspecified: Secondary | ICD-10-CM | POA: Diagnosis not present

## 2023-11-23 DIAGNOSIS — R5081 Fever presenting with conditions classified elsewhere: Secondary | ICD-10-CM | POA: Diagnosis not present

## 2023-11-23 LAB — BASIC METABOLIC PANEL
Anion gap: 11 (ref 5–15)
Anion gap: 14 (ref 5–15)
Anion gap: 6 (ref 5–15)
Anion gap: 9 (ref 5–15)
Anion gap: 9 (ref 5–15)
BUN: 5 mg/dL — ABNORMAL LOW (ref 8–23)
BUN: <5 mg/dL — ABNORMAL LOW (ref 8–23)
BUN: <5 mg/dL — ABNORMAL LOW (ref 8–23)
BUN: <5 mg/dL — ABNORMAL LOW (ref 8–23)
BUN: <5 mg/dL — ABNORMAL LOW (ref 8–23)
CO2: 23 mmol/L (ref 22–32)
CO2: 23 mmol/L (ref 22–32)
CO2: 23 mmol/L (ref 22–32)
CO2: 24 mmol/L (ref 22–32)
CO2: 26 mmol/L (ref 22–32)
Calcium: 7.5 mg/dL — ABNORMAL LOW (ref 8.9–10.3)
Calcium: 7.5 mg/dL — ABNORMAL LOW (ref 8.9–10.3)
Calcium: 7.8 mg/dL — ABNORMAL LOW (ref 8.9–10.3)
Calcium: 7.9 mg/dL — ABNORMAL LOW (ref 8.9–10.3)
Calcium: 8 mg/dL — ABNORMAL LOW (ref 8.9–10.3)
Chloride: 86 mmol/L — ABNORMAL LOW (ref 98–111)
Chloride: 86 mmol/L — ABNORMAL LOW (ref 98–111)
Chloride: 87 mmol/L — ABNORMAL LOW (ref 98–111)
Chloride: 91 mmol/L — ABNORMAL LOW (ref 98–111)
Chloride: 92 mmol/L — ABNORMAL LOW (ref 98–111)
Creatinine, Ser: 0.51 mg/dL (ref 0.44–1.00)
Creatinine, Ser: 0.56 mg/dL (ref 0.44–1.00)
Creatinine, Ser: 0.57 mg/dL (ref 0.44–1.00)
Creatinine, Ser: 0.58 mg/dL (ref 0.44–1.00)
Creatinine, Ser: 0.65 mg/dL (ref 0.44–1.00)
GFR, Estimated: >60 mL/min (ref >60)
GFR, Estimated: >60 mL/min (ref >60)
GFR, Estimated: >60 mL/min (ref >60)
GFR, Estimated: >60 mL/min (ref >60)
GFR, Estimated: >60 mL/min (ref >60)
Glucose, Bld: 103 mg/dL — ABNORMAL HIGH (ref 70–99)
Glucose, Bld: 117 mg/dL — ABNORMAL HIGH (ref 70–99)
Glucose, Bld: 95 mg/dL (ref 70–99)
Glucose, Bld: 95 mg/dL (ref 70–99)
Glucose, Bld: 97 mg/dL (ref 70–99)
Potassium: 3.2 mmol/L — ABNORMAL LOW (ref 3.5–5.1)
Potassium: 3.6 mmol/L (ref 3.5–5.1)
Potassium: 3.8 mmol/L (ref 3.5–5.1)
Potassium: 4.1 mmol/L (ref 3.5–5.1)
Potassium: 4.3 mmol/L (ref 3.5–5.1)
Sodium: 119 mmol/L — CL (ref 135–145)
Sodium: 120 mmol/L — ABNORMAL LOW (ref 135–145)
Sodium: 123 mmol/L — ABNORMAL LOW (ref 135–145)
Sodium: 123 mmol/L — ABNORMAL LOW (ref 135–145)
Sodium: 125 mmol/L — ABNORMAL LOW (ref 135–145)

## 2023-11-23 LAB — CBC WITH DIFFERENTIAL/PLATELET
Abs Immature Granulocytes: 0.1 10*3/uL — ABNORMAL HIGH (ref 0.00–0.07)
Basophils Absolute: 0 10*3/uL (ref 0.0–0.1)
Basophils Relative: 0 %
Eosinophils Absolute: 0 10*3/uL (ref 0.0–0.5)
Eosinophils Relative: 0 %
HCT: 26.8 % — ABNORMAL LOW (ref 36.0–46.0)
Hemoglobin: 9.5 g/dL — ABNORMAL LOW (ref 12.0–15.0)
Immature Granulocytes: 1 %
Lymphocytes Relative: 13 %
Lymphs Abs: 1.5 10*3/uL (ref 0.7–4.0)
MCH: 27.5 pg (ref 26.0–34.0)
MCHC: 35.4 g/dL (ref 30.0–36.0)
MCV: 77.5 fL — ABNORMAL LOW (ref 80.0–100.0)
Monocytes Absolute: 0.9 10*3/uL (ref 0.1–1.0)
Monocytes Relative: 8 %
Neutro Abs: 8.8 10*3/uL — ABNORMAL HIGH (ref 1.7–7.7)
Neutrophils Relative %: 78 %
Platelets: 623 10*3/uL — ABNORMAL HIGH (ref 150–400)
RBC: 3.46 MIL/uL — ABNORMAL LOW (ref 3.87–5.11)
RDW: 17.4 % — ABNORMAL HIGH (ref 11.5–15.5)
WBC: 11.3 10*3/uL — ABNORMAL HIGH (ref 4.0–10.5)
nRBC: 0 % (ref 0.0–0.2)

## 2023-11-23 LAB — MRSA NEXT GEN BY PCR, NASAL: MRSA by PCR Next Gen: NOT DETECTED

## 2023-11-23 LAB — MAGNESIUM: Magnesium: 1.5 mg/dL — ABNORMAL LOW (ref 1.7–2.4)

## 2023-11-23 LAB — OSMOLALITY, URINE: Osmolality, Ur: 479 mosm/kg (ref 300–900)

## 2023-11-23 MED ORDER — SODIUM CHLORIDE 3 % IV SOLN
INTRAVENOUS | Status: DC
  Filled 2023-11-23 (×3): qty 500

## 2023-11-23 MED ORDER — POTASSIUM CHLORIDE CRYS ER 20 MEQ PO TBCR
40.0000 meq | EXTENDED_RELEASE_TABLET | ORAL | Status: AC
  Administered 2023-11-23 (×2): 40 meq via ORAL
  Filled 2023-11-23 (×2): qty 2

## 2023-11-23 MED ORDER — SODIUM CHLORIDE 1 G PO TABS
1.0000 g | ORAL_TABLET | Freq: Three times a day (TID) | ORAL | Status: DC
  Administered 2023-11-23 – 2023-11-26 (×10): 1 g via ORAL
  Filled 2023-11-23 (×12): qty 1

## 2023-11-23 MED ORDER — MAGNESIUM SULFATE 4 GM/100ML IV SOLN
4.0000 g | Freq: Once | INTRAVENOUS | Status: AC
  Administered 2023-11-23: 4 g via INTRAVENOUS
  Filled 2023-11-23: qty 100

## 2023-11-23 NOTE — Progress Notes (Addendum)
 Physical Therapy Treatment Patient Details Name: Amanda Potter MRN: [id number] DOB: 1946/07/08 Today's Date: 11/23/2023   History of Present Illness Pt is a 78 y/o F admitted on 11/11/23 after presenting for evaluation of a fever. Pt found to be neutropenic. 2/18 Rt thoracentesis Transferred to ICU 2/27 for severe hyponatremia.  PMH: recently diagnosed stage IV lung adenocarcinoma with malignant R pleural effusion, hyperthyroidism, HLD, GERD    PT Comments  No significant decline noted in function since admission to ICU however complains of fatigue (she had walked with OT earlier, so this may be the cause.) Declined to ambulate further distances despite encouragement and good vitals. Reviewed LE exercises which she states she performs regularly. Patient will continue to benefit from skilled physical therapy services to further improve independence with functional mobility. Goals remain appropriate, aiming for home with HHPT at this time.     If plan is discharge home, recommend the following: A little help with walking and/or transfers;A little help with bathing/dressing/bathroom;Assistance with cooking/housework;Assist for transportation;Help with stairs or ramp for entrance   Can travel by private vehicle        Equipment Recommendations  None recommended by PT    Recommendations for Other Services       Precautions / Restrictions Precautions Precautions: Fall Precaution/Restrictions Comments: monitor O2 and HR Restrictions Weight Bearing Restrictions Per Provider Order: No     Mobility  Bed Mobility Overal bed mobility: Needs Assistance Bed Mobility: Sit to Supine       Sit to supine: Supervision   General bed mobility comments: Supervision for safety, managed lines/leads for pt, some effort getting LEs into bed but performed without assist.    Transfers Overall transfer level: Needs assistance Equipment used: Rolling walker (2 wheels) Transfers: Sit to/from Stand Sit  to Stand: Contact guard assist           General transfer comment: CGA for safety, slow to rise, rocks for momentum, cues for hand placement and technique. Performed from recliner.    Ambulation/Gait Ambulation/Gait assistance: Contact guard assist Gait Distance (Feet): 18 Feet Assistive device: Rolling walker (2 wheels) Gait Pattern/deviations: Decreased stride length, Trunk flexed, Step-through pattern Gait velocity: decreased Gait velocity interpretation: <1.31 ft/sec, indicative of household ambulator   General Gait Details: Cues for upright posture and RW proximity No overt LOB noted. Cues to align RW with surface prior to sitting. Declined to ambulate further despite encouragement, due to fatigue, wants to rest. Walked earlier with OT per her report. HR to 115.   Stairs             Wheelchair Mobility     Tilt Bed    Modified Rankin (Stroke Patients Only)       Balance Overall balance assessment: Needs assistance Sitting-balance support: No upper extremity supported, Feet supported Sitting balance-Leahy Scale: Good     Standing balance support: Bilateral upper extremity supported, Reliant on assistive device for balance Standing balance-Leahy Scale: Poor Standing balance comment: RW for ambulation                            Communication Communication Communication: No apparent difficulties  Cognition Arousal: Alert Behavior During Therapy: WFL for tasks assessed/performed   PT - Cognitive impairments: No family/caregiver present to determine baseline                         Following commands: Intact  Cueing Cueing Techniques: Verbal cues  Exercises General Exercises - Lower Extremity Ankle Circles/Pumps: AROM, Both, 10 reps, Supine Quad Sets: Strengthening, Both, 10 reps, Supine Gluteal Sets: Strengthening, Both, 10 reps, Supine    General Comments General comments (skin integrity, edema, etc.): BP 110/76 HR 104 at  rest (115 with gait). SpO2 95% on RA.      Pertinent Vitals/Pain Pain Assessment Pain Assessment: No/denies pain    Home Living Family/patient expects to be discharged to:: Private residence Living Arrangements: Children Available Help at Discharge: Available 24 hours/day;Friend(s);Family Type of Home: House Home Access: Ramped entrance       Home Layout: One level Home Equipment: Shower seat;Grab bars - toilet;Cane - quad;Cane - single point;Lift chair;Wheelchair - Forensic psychologist (2 wheels);BSC/3in1 Additional Comments: Pt reports she has rails mounted on the walls of her house as her house was originally built to be a nursing home.    Prior Function            PT Goals (current goals can now be found in the care plan section) Acute Rehab PT Goals Patient Stated Goal: to have more energy PT Goal Formulation: With patient Time For Goal Achievement: 11/26/23 Potential to Achieve Goals: Good Progress towards PT goals: Progressing toward goals    Frequency    Min 1X/week      PT Plan      Co-evaluation              AM-PAC PT "6 Clicks" Mobility   Outcome Measure  Help needed turning from your back to your side while in a flat bed without using bedrails?: A Little Help needed moving from lying on your back to sitting on the side of a flat bed without using bedrails?: A Little Help needed moving to and from a bed to a chair (including a wheelchair)?: A Little Help needed standing up from a chair using your arms (e.g., wheelchair or bedside chair)?: A Little Help needed to walk in hospital room?: A Little Help needed climbing 3-5 steps with a railing? : A Little 6 Click Score: 18    End of Session Equipment Utilized During Treatment: Gait belt Activity Tolerance: Patient limited by fatigue Patient left: in bed;with call bell/phone within reach;with bed alarm set Nurse Communication: Mobility status PT Visit Diagnosis: Muscle weakness (generalized)  (M62.81);Other abnormalities of gait and mobility (R26.89);Unsteadiness on feet (R26.81)     Time: 1450-1500 PT Time Calculation (min) (ACUTE ONLY): 10 min  Charges:    $Therapeutic Activity: 8-22 mins PT General Charges $$ ACUTE PT VISIT: 1 Visit                     Kathlyn Sacramento, PT, DPT [facility]  Rehabilitation Services Physical Therapist Office: [phone] Website: [email or web address]    Berton Mount 11/23/2023, 3:59 PM

## 2023-11-23 NOTE — Progress Notes (Signed)
 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  Patient was admitted with neutropenic fever.  Patient was admitted with absolute neutrophil count of 0.9 and WBC of 2.2.  Patient has been on IV antibiotics.  ANC normalized and abx were completed.  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 -Follow-up with primary care provider and oncology team on discharge.,   Stage IV lung adenocarcinoma with malignant right pleural effusion -Underwent thoracentesis on 11/13/2023 yielding 350 cc of clear yellow fluid. -Fluid analysis consistent with exudate however no different from previous thoracentesis in January -Antibiotics discontinued  -Pleural fluid culture showed no growth  -Follow-up oncology as outpatient   Hyperthyroidism -Continue methimazole 20 mg p.o. daily   Hyperlipidemia -Continue Zocor 20 mg p.o. daily   GERD -Continue Protonix   Sinusitis -Start Flonase, Mucinex, Claritin  Interval History:   Transferred her to the ICU yesterday due to severe lethargy and worsening hyponatremia.  She was started on 3% normal saline and mentation and sodium have been improving slowly since.   Old records reviewed in assessment of this patient  Antimicrobials:   DVT prophylaxis:  enoxaparin (LOVENOX) injection 40 mg Start: 11/11/23 2200   Code Status:   Code Status: Full Code  Mobility Assessment (Last 72 Hours)     Mobility Assessment     Row Name 11/22/23 1100 11/22/23 0700 11/21/23 1931 11/21/23 1208 11/21/23 1000   Does patient have an order for bedrest or is patient medically unstable No - Continue assessment No - Continue assessment No - Continue assessment -- No - Continue assessment   What is the highest level of mobility based on the progressive mobility assessment? Level 5 (Walks with assist in room/hall) - Balance while stepping forward/back and can walk in room with assist - Complete Level 5 (Walks with assist in room/hall) - Balance while stepping forward/back and can walk in room with assist - Complete Level 5 (Walks with assist in room/hall) - Balance while stepping forward/back and can walk in room with assist - Complete Level 5 (Walks with assist in room/hall) - Balance while stepping forward/back and can walk in room with assist - Complete Level 5 (Walks with assist in room/hall) - Balance while stepping forward/back and can walk in room with assist - Complete   Is the above level different from baseline mobility prior to current illness? Yes - Recommend PT order Yes - Recommend PT order Yes - Recommend PT order -- Yes - Recommend PT order    Row Name 11/20/23 1930  Does patient have an order for bedrest or is patient medically unstable No - Continue assessment       What is the highest level of mobility based on the progressive mobility assessment? Level 5 (Walks with assist in room/hall) - Balance while stepping forward/back and can walk in room with assist -  Complete       Is the above level different from baseline mobility prior to current illness? Yes - Recommend PT order                Barriers to discharge: None Disposition Plan:  Home  HH orders placed:  Status is: Inpt  Objective: Blood pressure 132/71, pulse 93, temperature 98.3 F (36.8 C), temperature source Oral, resp. rate (!) 9, height 5\' 6"  (1.676 m), weight 70.6 kg, SpO2 96%.  Examination:  Physical Exam Constitutional:      Comments: Improved lethargy.  Now awake, alert  HENT:     Head: Normocephalic and atraumatic.     Mouth/Throat:     Mouth: Mucous membranes are moist.  Eyes:     Extraocular Movements: Extraocular movements intact.  Cardiovascular:     Rate and Rhythm: Normal rate and regular rhythm.  Pulmonary:     Effort: Pulmonary effort is normal. No respiratory distress.     Breath sounds: Normal breath sounds. No wheezing.  Abdominal:     General: Bowel sounds are normal. There is no distension.     Palpations: Abdomen is soft.     Tenderness: There is no abdominal tenderness.  Musculoskeletal:        General: Normal range of motion.     Cervical back: Normal range of motion and neck supple.  Skin:    General: Skin is warm and dry.  Neurological:     General: No focal deficit present.  Psychiatric:        Mood and Affect: Mood normal.      Consultants:    Procedures:    Data Reviewed: Results for orders placed or performed during the hospital encounter of 11/11/23 (from the past 24 hours)  Basic metabolic panel     Status: Abnormal   Collection Time: 11/22/23  7:38 PM  Result Value Ref Range   Sodium 121 (L) 135 - 145 mmol/L   Potassium 4.7 3.5 - 5.1 mmol/L   Chloride 88 (L) 98 - 111 mmol/L   CO2 24 22 - 32 mmol/L   Glucose, Bld 85 70 - 99 mg/dL   BUN <5 (L) 8 - 23 mg/dL   Creatinine, Ser 4.09 0.44 - 1.00 mg/dL   Calcium 7.6 (L) 8.9 - 10.3 mg/dL   GFR, Estimated >81 >19 mL/min   Anion gap 9 5 - 15  Basic metabolic panel      Status: Abnormal   Collection Time: 11/22/23 11:32 PM  Result Value Ref Range   Sodium 119 (LL) 135 - 145 mmol/L   Potassium 3.6 3.5 - 5.1 mmol/L   Chloride 87 (L) 98 - 111 mmol/L   CO2 23 22 - 32 mmol/L   Glucose, Bld 97 70 - 99 mg/dL   BUN <5 (L) 8 - 23 mg/dL   Creatinine, Ser 1.47 0.44 - 1.00 mg/dL   Calcium 7.5 (L) 8.9 - 10.3 mg/dL   GFR, Estimated >82 >95 mL/min   Anion gap 9 5 - 15  CBC with Differential/Platelet     Status: Abnormal   Collection Time: 11/23/23  6:44 AM  Result Value Ref Range  WBC 11.3 (H) 4.0 - 10.5 K/uL   RBC 3.46 (L) 3.87 - 5.11 MIL/uL   Hemoglobin 9.5 (L) 12.0 - 15.0 g/dL   HCT 16.1 (L) 09.6 - 04.5 %   MCV 77.5 (L) 80.0 - 100.0 fL   MCH 27.5 26.0 - 34.0 pg   MCHC 35.4 30.0 - 36.0 g/dL   RDW 40.9 (H) 81.1 - 91.4 %   Platelets 623 (H) 150 - 400 K/uL   nRBC 0.0 0.0 - 0.2 %   Neutrophils Relative % 78 %   Neutro Abs 8.8 (H) 1.7 - 7.7 K/uL   Lymphocytes Relative 13 %   Lymphs Abs 1.5 0.7 - 4.0 K/uL   Monocytes Relative 8 %   Monocytes Absolute 0.9 0.1 - 1.0 K/uL   Eosinophils Relative 0 %   Eosinophils Absolute 0.0 0.0 - 0.5 K/uL   Basophils Relative 0 %   Basophils Absolute 0.0 0.0 - 0.1 K/uL   Immature Granulocytes 1 %   Abs Immature Granulocytes 0.10 (H) 0.00 - 0.07 K/uL  Magnesium     Status: Abnormal   Collection Time: 11/23/23  6:44 AM  Result Value Ref Range   Magnesium 1.5 (L) 1.7 - 2.4 mg/dL  Basic metabolic panel     Status: Abnormal   Collection Time: 11/23/23  6:44 AM  Result Value Ref Range   Sodium 123 (L) 135 - 145 mmol/L   Potassium 3.2 (L) 3.5 - 5.1 mmol/L   Chloride 86 (L) 98 - 111 mmol/L   CO2 23 22 - 32 mmol/L   Glucose, Bld 95 70 - 99 mg/dL   BUN <5 (L) 8 - 23 mg/dL   Creatinine, Ser 7.82 0.44 - 1.00 mg/dL   Calcium 8.0 (L) 8.9 - 10.3 mg/dL   GFR, Estimated >95 >62 mL/min   Anion gap 14 5 - 15  Basic metabolic panel     Status: Abnormal   Collection Time: 11/23/23  8:15 AM  Result Value Ref Range   Sodium 120 (L)  135 - 145 mmol/L   Potassium 3.8 3.5 - 5.1 mmol/L   Chloride 86 (L) 98 - 111 mmol/L   CO2 23 22 - 32 mmol/L   Glucose, Bld 117 (H) 70 - 99 mg/dL   BUN 5 (L) 8 - 23 mg/dL   Creatinine, Ser 1.30 0.44 - 1.00 mg/dL   Calcium 7.8 (L) 8.9 - 10.3 mg/dL   GFR, Estimated >86 >57 mL/min   Anion gap 11 5 - 15  Basic metabolic panel     Status: Abnormal   Collection Time: 11/23/23 12:38 PM  Result Value Ref Range   Sodium 123 (L) 135 - 145 mmol/L   Potassium 4.3 3.5 - 5.1 mmol/L   Chloride 91 (L) 98 - 111 mmol/L   CO2 26 22 - 32 mmol/L   Glucose, Bld 95 70 - 99 mg/dL   BUN <5 (L) 8 - 23 mg/dL   Creatinine, Ser 8.46 0.44 - 1.00 mg/dL   Calcium 7.5 (L) 8.9 - 10.3 mg/dL   GFR, Estimated >96 >29 mL/min   Anion gap 6 5 - 15    I have reviewed pertinent nursing notes, vitals, labs, and images as necessary. I have ordered labwork to follow up on as indicated.  I have reviewed the last notes from staff over past 24 hours. I have discussed patient's care plan and test results with nursing staff, CM/SW, and other staff as appropriate.  Critical care time to evaluate and treat this  patient was 55 minutes.  Independent of separate billable services  This patient is critically ill with some or all of the following life-threatening issues requiring my presence at the bedside: Hemodynamic instability requiring titration of medications Oxygenation/ventilation instability requiring frequent modifications of support Cardiac rhythm disturbances requiring evaluation and/or interventions Fluctuations in neurologic function requiring evaluation and/or interventions and/or fluid/volume titration    LOS: 12 days   Lewie Chamber, MD [facility] 11/23/2023, 2:23 PM

## 2023-11-23 NOTE — Progress Notes (Signed)
 Occupational Therapy Treatment Patient Details Name: Amanda Potter MRN: [id number] DOB: 1946/04/16 Today's Date: 11/23/2023   History of present illness Pt is a 78 y/o F admitted on 11/11/23 after presenting for evaluation of a fever. Pt found to be neutropenic. 2/18 Rt thoracentesis Transferred to ICU 2/27 for severe hyponatremia.  PMH: recently diagnosed stage IV lung adenocarcinoma with malignant R pleural effusion, hyperthyroidism, HLD, GERD   OT comments  Patient seen with son present initially, motivated with minor encouragement to ambulate and complete ADLs. Patient completing BSC transfer at min A, then ambulating unit with CGA with HR elevating as high as 130s. Patient sitting in recliner at end of session, with all vitals stable. OT recommendation remains appropriate, will continue to follow.       If plan is discharge home, recommend the following:  A little help with walking and/or transfers;A little help with bathing/dressing/bathroom;Assistance with cooking/housework;Help with stairs or ramp for entrance;Assist for transportation   Equipment Recommendations  None recommended by OT    Recommendations for Other Services      Precautions / Restrictions Precautions Precautions: Fall Restrictions Weight Bearing Restrictions Per Provider Order: No       Mobility Bed Mobility Overal bed mobility: Needs Assistance Bed Mobility: Supine to Sit     Supine to sit: Supervision          Transfers Overall transfer level: Needs assistance Equipment used: Rolling walker (2 wheels) Transfers: Sit to/from Stand, Bed to chair/wheelchair/BSC Sit to Stand: Contact guard assist Stand pivot transfers: Min assist         General transfer comment: Min A due to poor eccentric control to sit to transfer to [facility], otherwise CGA for functional ambulation     Balance Overall balance assessment: Needs assistance Sitting-balance support: No upper extremity supported, Feet  supported Sitting balance-Leahy Scale: Good     Standing balance support: During functional activity, Bilateral upper extremity supported, Reliant on assistive device for balance Standing balance-Leahy Scale: Poor Standing balance comment: RW for ambulation                           ADL either performed or assessed with clinical judgement   ADL Overall ADL's : Needs assistance/impaired                         Toilet Transfer: Minimal assistance;Stand-pivot;BSC/3in1 Statistician Details (indicate cue type and reason): poor eccentric control to sit on BSC, min A stand pivot Toileting- Clothing Manipulation and Hygiene: Total assistance;Sitting/lateral lean;Sit to/from stand Toileting - Clothing Manipulation Details (indicate cue type and reason): unable to complete peri-care in standing     Functional mobility during ADLs: Moderate assistance;Cueing for sequencing;Cueing for safety;Rolling walker (2 wheels) General ADL Comments: Patient seen with son present initially, motivated with minor encouragement to ambulate and complete ADLs. Patient completing BSC transfer at min A, then ambulating unit with CGA with HR elevating as high as 130s. Patient sitting in recliner at end of session, with all vitals stable. OT recommendation remains appropriate, will continue to follow.    Extremity/Trunk Assessment Upper Extremity Assessment Upper Extremity Assessment: Generalized weakness            Vision       Perception     Praxis     Communication Communication Communication: No apparent difficulties   Cognition Arousal: Alert Behavior During Therapy: WFL for tasks assessed/performed Cognition: No apparent impairments  OT - Cognition Comments: Alert and participatory                 Following commands: Intact        Cueing   Cueing Techniques: Verbal cues  Exercises      Shoulder Instructions       General Comments BP  110/70 (82) sitting EOB, HR as high as 130s with functional ambulation, O2 stable on RA    Pertinent Vitals/ Pain       Pain Assessment Pain Assessment: Faces Pain Score: 0-No pain Pain Intervention(s): Limited activity within patient's tolerance, Monitored during session, Repositioned  Home Living                                          Prior Functioning/Environment              Frequency  Min 2X/week        Progress Toward Goals  OT Goals(current goals can now be found in the care plan section)  Progress towards OT goals: Progressing toward goals  Acute Rehab OT Goals Patient Stated Goal: to get better OT Goal Formulation: With patient Time For Goal Achievement: 11/27/23 Potential to Achieve Goals: Good  Plan      Co-evaluation                 AM-PAC OT "6 Clicks" Daily Activity     Outcome Measure   Help from another person eating meals?: None Help from another person taking care of personal grooming?: A Little Help from another person toileting, which includes using toliet, bedpan, or urinal?: A Lot Help from another person bathing (including washing, rinsing, drying)?: A Little Help from another person to put on and taking off regular upper body clothing?: A Little Help from another person to put on and taking off regular lower body clothing?: A Lot 6 Click Score: 17    End of Session Equipment Utilized During Treatment: Gait belt;Rolling walker (2 wheels)  OT Visit Diagnosis: Unsteadiness on feet (R26.81);Muscle weakness (generalized) (M62.81)   Activity Tolerance Patient tolerated treatment well   Patient Left in chair;with call bell/phone within reach;with chair alarm set   Nurse Communication Mobility status        Time: 1343-1415 OT Time Calculation (min): 32 min  Charges: OT General Charges $OT Visit: 1 Visit OT Treatments $Self Care/Home Management : 23-37 mins  Pollyann Glen E. Basel Defalco, OTR/L Acute  Rehabilitation Services [phone]   Cherlyn Cushing 11/23/2023, 2:51 PM

## 2023-11-23 NOTE — Progress Notes (Signed)
 Provider made aware of patients current NA level. Order to reduce 3% Saline to 17 mL/hr from 20 mL/hr. Continuing current treatment plan.

## 2023-11-24 DIAGNOSIS — C3491 Malignant neoplasm of unspecified part of right bronchus or lung: Secondary | ICD-10-CM | POA: Diagnosis not present

## 2023-11-24 DIAGNOSIS — E871 Hypo-osmolality and hyponatremia: Secondary | ICD-10-CM | POA: Diagnosis not present

## 2023-11-24 DIAGNOSIS — D709 Neutropenia, unspecified: Secondary | ICD-10-CM | POA: Diagnosis not present

## 2023-11-24 DIAGNOSIS — R5081 Fever presenting with conditions classified elsewhere: Secondary | ICD-10-CM | POA: Diagnosis not present

## 2023-11-24 LAB — BASIC METABOLIC PANEL
Anion gap: 9 (ref 5–15)
Anion gap: 12 (ref 5–15)
Anion gap: 8 (ref 5–15)
Anion gap: 11 (ref 5–15)
BUN: <5 mg/dL — ABNORMAL LOW (ref 8–23)
BUN: <5 mg/dL — ABNORMAL LOW (ref 8–23)
BUN: <5 mg/dL — ABNORMAL LOW (ref 8–23)
BUN: <5 mg/dL — ABNORMAL LOW (ref 8–23)
CO2: 23 mmol/L (ref 22–32)
CO2: 19 mmol/L — ABNORMAL LOW (ref 22–32)
CO2: 21 mmol/L — ABNORMAL LOW (ref 22–32)
CO2: 22 mmol/L (ref 22–32)
Calcium: 7.4 mg/dL — ABNORMAL LOW (ref 8.9–10.3)
Calcium: 7.7 mg/dL — ABNORMAL LOW (ref 8.9–10.3)
Calcium: 7.4 mg/dL — ABNORMAL LOW (ref 8.9–10.3)
Calcium: 7.5 mg/dL — ABNORMAL LOW (ref 8.9–10.3)
Chloride: 92 mmol/L — ABNORMAL LOW (ref 98–111)
Chloride: 94 mmol/L — ABNORMAL LOW (ref 98–111)
Chloride: 95 mmol/L — ABNORMAL LOW (ref 98–111)
Chloride: 93 mmol/L — ABNORMAL LOW (ref 98–111)
Creatinine, Ser: 0.59 mg/dL (ref 0.44–1.00)
Creatinine, Ser: 0.59 mg/dL (ref 0.44–1.00)
Creatinine, Ser: 0.58 mg/dL (ref 0.44–1.00)
Creatinine, Ser: 0.6 mg/dL (ref 0.44–1.00)
GFR, Estimated: >60 mL/min (ref >60)
GFR, Estimated: >60 mL/min (ref >60)
GFR, Estimated: >60 mL/min (ref >60)
GFR, Estimated: >60 mL/min (ref >60)
Glucose, Bld: 97 mg/dL (ref 70–99)
Glucose, Bld: 122 mg/dL — ABNORMAL HIGH (ref 70–99)
Glucose, Bld: 90 mg/dL (ref 70–99)
Glucose, Bld: 113 mg/dL — ABNORMAL HIGH (ref 70–99)
Potassium: 3.8 mmol/L (ref 3.5–5.1)
Potassium: 3.6 mmol/L (ref 3.5–5.1)
Potassium: 3.4 mmol/L — ABNORMAL LOW (ref 3.5–5.1)
Potassium: 3.1 mmol/L — ABNORMAL LOW (ref 3.5–5.1)
Sodium: 124 mmol/L — ABNORMAL LOW (ref 135–145)
Sodium: 125 mmol/L — ABNORMAL LOW (ref 135–145)
Sodium: 124 mmol/L — ABNORMAL LOW (ref 135–145)
Sodium: 126 mmol/L — ABNORMAL LOW (ref 135–145)

## 2023-11-24 LAB — CBC WITH DIFFERENTIAL/PLATELET
Abs Immature Granulocytes: 0.13 10*3/uL — ABNORMAL HIGH (ref 0.00–0.07)
Basophils Absolute: 0.1 10*3/uL (ref 0.0–0.1)
Basophils Relative: 1 %
Eosinophils Absolute: 0 10*3/uL (ref 0.0–0.5)
Eosinophils Relative: 0 %
HCT: 26.5 % — ABNORMAL LOW (ref 36.0–46.0)
Hemoglobin: 9.1 g/dL — ABNORMAL LOW (ref 12.0–15.0)
Immature Granulocytes: 1 %
Lymphocytes Relative: 15 %
Lymphs Abs: 1.7 10*3/uL (ref 0.7–4.0)
MCH: 27.3 pg (ref 26.0–34.0)
MCHC: 34.3 g/dL (ref 30.0–36.0)
MCV: 79.6 fL — ABNORMAL LOW (ref 80.0–100.0)
Monocytes Absolute: 0.9 10*3/uL (ref 0.1–1.0)
Monocytes Relative: 8 %
Neutro Abs: 8.8 10*3/uL — ABNORMAL HIGH (ref 1.7–7.7)
Neutrophils Relative %: 75 %
Platelets: 540 10*3/uL — ABNORMAL HIGH (ref 150–400)
RBC: 3.33 MIL/uL — ABNORMAL LOW (ref 3.87–5.11)
RDW: 18 % — ABNORMAL HIGH (ref 11.5–15.5)
WBC: 11.6 10*3/uL — ABNORMAL HIGH (ref 4.0–10.5)
nRBC: 0 % (ref 0.0–0.2)

## 2023-11-24 MED ORDER — FUROSEMIDE 10 MG/ML IJ SOLN
40.0000 mg | Freq: Once | INTRAMUSCULAR | Status: AC
  Administered 2023-11-24: 40 mg via INTRAVENOUS
  Filled 2023-11-24: qty 4

## 2023-11-24 MED ORDER — POTASSIUM CHLORIDE CRYS ER 20 MEQ PO TBCR
40.0000 meq | EXTENDED_RELEASE_TABLET | Freq: Once | ORAL | Status: AC
  Administered 2023-11-24: 40 meq via ORAL
  Filled 2023-11-24: qty 2

## 2023-11-24 MED ORDER — POTASSIUM CHLORIDE CRYS ER 20 MEQ PO TBCR
40.0000 meq | EXTENDED_RELEASE_TABLET | Freq: Once | ORAL | Status: AC
Start: 2023-11-24 — End: 2023-11-24
  Administered 2023-11-24: 40 meq via ORAL
  Filled 2023-11-24: qty 2

## 2023-11-24 MED ORDER — POTASSIUM CHLORIDE CRYS ER 20 MEQ PO TBCR: 40.0000 meq | EXTENDED_RELEASE_TABLET | ORAL | Status: DC

## 2023-11-24 MED ORDER — LABETALOL HCL 5 MG/ML IV SOLN: 10.0000 mg | INTRAVENOUS | Status: DC | PRN

## 2023-11-24 MED ORDER — POTASSIUM CHLORIDE 10 MEQ/100ML IV SOLN
10.0000 meq | INTRAVENOUS | Status: DC
  Administered 2023-11-24 – 2023-11-25 (×3): 10 meq via INTRAVENOUS
  Filled 2023-11-24 (×4): qty 100

## 2023-11-24 MED ORDER — HYDRALAZINE HCL 25 MG PO TABS: 25.0000 mg | ORAL_TABLET | ORAL | Status: DC | PRN

## 2023-11-24 NOTE — Progress Notes (Signed)
 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  Patient was admitted with neutropenic fever.  Patient was admitted with absolute neutrophil count of 0.9 and WBC of 2.2.  Patient has been on IV antibiotics.  ANC normalized and abx were completed.  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 -Follow-up with primary care provider and oncology team on discharge.,   Stage IV lung adenocarcinoma with malignant right pleural effusion -Underwent thoracentesis on 11/13/2023 yielding 350 cc of clear yellow fluid. -Fluid analysis consistent with exudate however no different from previous thoracentesis in January -Antibiotics discontinued  -Pleural fluid culture showed no growth  -Follow-up oncology as outpatient   Hyperthyroidism -Continue methimazole 20 mg p.o. daily   Hyperlipidemia -Continue Zocor 20 mg p.o. daily   GERD -Continue Protonix   Sinusitis -Start Flonase, Mucinex, Claritin  Interval History:  Restless overnight.  Mentation remains improved this morning.  No focal weakness or neurodeficits.  Appetite decent. Ambulated with therapy yesterday.  Old records reviewed in  assessment of this patient  Antimicrobials:   DVT prophylaxis:  enoxaparin (LOVENOX) injection 40 mg Start: 11/11/23 2200   Code Status:   Code Status: Full Code  Mobility Assessment (Last 72 Hours)     Mobility Assessment     Row Name 11/24/23 0800 11/23/23 2000 11/23/23 1500 11/23/23 1400 11/22/23 1100   Does patient have an order for bedrest or is patient medically unstable No - Continue assessment No - Continue assessment -- -- No - Continue assessment   What is the highest level of mobility based on the progressive mobility assessment? Level 5 (Walks with assist in room/hall) - Balance while stepping forward/back and can walk in room with assist - Complete Level 5 (Walks with assist in room/hall) - Balance while stepping forward/back and can walk in room with assist - Complete Level 5 (Walks with assist in room/hall) - Balance while stepping forward/back and can walk in room with assist - Complete Level 5 (Walks with assist in room/hall) - Balance while stepping forward/back and can walk in room with assist - Complete Level 5 (Walks with assist in room/hall) - Balance while stepping forward/back and can walk in room with assist - Complete   Is the above level different from baseline mobility prior to current illness? Yes - Recommend PT order Yes - Recommend PT order -- -- Yes - Recommend PT order    Row Name 11/22/23 0700 11/21/23 1931         Does patient have an order for bedrest or is patient medically unstable No - Continue assessment No - Continue assessment      What is the highest level of mobility based on the progressive mobility assessment? Level 5 (Walks with assist  in room/hall) - Balance while stepping forward/back and can walk in room with assist - Complete Level 5 (Walks with assist in room/hall) - Balance while stepping forward/back and can walk in room with assist - Complete      Is the above level different from baseline mobility prior to current illness? Yes -  Recommend PT order Yes - Recommend PT order               Barriers to discharge: None Disposition Plan:  Home  HH orders placed:  Status is: Inpt  Objective: Blood pressure 116/79, pulse (!) 108, temperature (!) 97.5 F (36.4 C), temperature source Oral, resp. rate (!) 21, height 5\' 6"  (1.676 m), weight 70.6 kg, SpO2 93%.  Examination:  Physical Exam Constitutional:      Comments: Improved lethargy.  Now awake, alert  HENT:     Head: Normocephalic and atraumatic.     Mouth/Throat:     Mouth: Mucous membranes are moist.  Eyes:     Extraocular Movements: Extraocular movements intact.  Cardiovascular:     Rate and Rhythm: Normal rate and regular rhythm.  Pulmonary:     Effort: Pulmonary effort is normal. No respiratory distress.     Breath sounds: Normal breath sounds. No wheezing.  Abdominal:     General: Bowel sounds are normal. There is no distension.     Palpations: Abdomen is soft.     Tenderness: There is no abdominal tenderness.  Musculoskeletal:        General: Normal range of motion.     Cervical back: Normal range of motion and neck supple.  Skin:    General: Skin is warm and dry.  Neurological:     General: No focal deficit present.  Psychiatric:        Mood and Affect: Mood normal.      Consultants:    Procedures:    Data Reviewed: Results for orders placed or performed during the hospital encounter of 11/11/23 (from the past 24 hours)  Basic metabolic panel     Status: Abnormal   Collection Time: 11/23/23  6:47 PM  Result Value Ref Range   Sodium 125 (L) 135 - 145 mmol/L   Potassium 4.1 3.5 - 5.1 mmol/L   Chloride 92 (L) 98 - 111 mmol/L   CO2 24 22 - 32 mmol/L   Glucose, Bld 103 (H) 70 - 99 mg/dL   BUN <5 (L) 8 - 23 mg/dL   Creatinine, Ser 1.61 0.44 - 1.00 mg/dL   Calcium 7.9 (L) 8.9 - 10.3 mg/dL   GFR, Estimated >09 >60 mL/min   Anion gap 9 5 - 15  Osmolality, urine     Status: None   Collection Time: 11/23/23  9:44 PM  Result Value Ref  Range   Osmolality, Ur 479 300 - 900 mOsm/kg  Basic metabolic panel     Status: Abnormal   Collection Time: 11/24/23  1:41 AM  Result Value Ref Range   Sodium 124 (L) 135 - 145 mmol/L   Potassium 3.8 3.5 - 5.1 mmol/L   Chloride 92 (L) 98 - 111 mmol/L   CO2 23 22 - 32 mmol/L   Glucose, Bld 97 70 - 99 mg/dL   BUN <5 (L) 8 - 23 mg/dL   Creatinine, Ser 4.54 0.44 - 1.00 mg/dL   Calcium 7.4 (L) 8.9 - 10.3 mg/dL   GFR, Estimated >09 >81 mL/min   Anion gap 9 5 - 15  CBC with Differential/Platelet  Status: Abnormal   Collection Time: 11/24/23  1:41 AM  Result Value Ref Range   WBC 11.6 (H) 4.0 - 10.5 K/uL   RBC 3.33 (L) 3.87 - 5.11 MIL/uL   Hemoglobin 9.1 (L) 12.0 - 15.0 g/dL   HCT 16.1 (L) 09.6 - 04.5 %   MCV 79.6 (L) 80.0 - 100.0 fL   MCH 27.3 26.0 - 34.0 pg   MCHC 34.3 30.0 - 36.0 g/dL   RDW 40.9 (H) 81.1 - 91.4 %   Platelets 540 (H) 150 - 400 K/uL   nRBC 0.0 0.0 - 0.2 %   Neutrophils Relative % 75 %   Neutro Abs 8.8 (H) 1.7 - 7.7 K/uL   Lymphocytes Relative 15 %   Lymphs Abs 1.7 0.7 - 4.0 K/uL   Monocytes Relative 8 %   Monocytes Absolute 0.9 0.1 - 1.0 K/uL   Eosinophils Relative 0 %   Eosinophils Absolute 0.0 0.0 - 0.5 K/uL   Basophils Relative 1 %   Basophils Absolute 0.1 0.0 - 0.1 K/uL   Immature Granulocytes 1 %   Abs Immature Granulocytes 0.13 (H) 0.00 - 0.07 K/uL  Basic metabolic panel     Status: Abnormal   Collection Time: 11/24/23  8:58 AM  Result Value Ref Range   Sodium 125 (L) 135 - 145 mmol/L   Potassium 3.6 3.5 - 5.1 mmol/L   Chloride 94 (L) 98 - 111 mmol/L   CO2 19 (L) 22 - 32 mmol/L   Glucose, Bld 122 (H) 70 - 99 mg/dL   BUN <5 (L) 8 - 23 mg/dL   Creatinine, Ser 7.82 0.44 - 1.00 mg/dL   Calcium 7.7 (L) 8.9 - 10.3 mg/dL   GFR, Estimated >95 >62 mL/min   Anion gap 12 5 - 15  Basic metabolic panel     Status: Abnormal   Collection Time: 11/24/23 11:42 AM  Result Value Ref Range   Sodium 124 (L) 135 - 145 mmol/L   Potassium 3.4 (L) 3.5 - 5.1 mmol/L    Chloride 95 (L) 98 - 111 mmol/L   CO2 21 (L) 22 - 32 mmol/L   Glucose, Bld 90 70 - 99 mg/dL   BUN <5 (L) 8 - 23 mg/dL   Creatinine, Ser 1.30 0.44 - 1.00 mg/dL   Calcium 7.4 (L) 8.9 - 10.3 mg/dL   GFR, Estimated >86 >57 mL/min   Anion gap 8 5 - 15    I have reviewed pertinent nursing notes, vitals, labs, and images as necessary. I have ordered labwork to follow up on as indicated.  I have reviewed the last notes from staff over past 24 hours. I have discussed patient's care plan and test results with nursing staff, CM/SW, and other staff as appropriate.  Critical care time to evaluate and treat this patient was 55 minutes.  Independent of separate billable services  This patient is critically ill with some or all of the following life-threatening issues requiring my presence at the bedside: Hemodynamic instability requiring titration of medications Oxygenation/ventilation instability requiring frequent modifications of support Cardiac rhythm disturbances requiring evaluation and/or interventions Fluctuations in neurologic function requiring evaluation and/or interventions and/or fluid/volume titration    LOS: 13 days   Lewie Chamber, MD [facility] 11/24/2023, 2:16 PM

## 2023-11-25 DIAGNOSIS — E876 Hypokalemia: Secondary | ICD-10-CM | POA: Diagnosis not present

## 2023-11-25 DIAGNOSIS — C3491 Malignant neoplasm of unspecified part of right bronchus or lung: Secondary | ICD-10-CM | POA: Diagnosis not present

## 2023-11-25 DIAGNOSIS — D709 Neutropenia, unspecified: Secondary | ICD-10-CM | POA: Diagnosis not present

## 2023-11-25 DIAGNOSIS — E871 Hypo-osmolality and hyponatremia: Secondary | ICD-10-CM | POA: Diagnosis not present

## 2023-11-25 LAB — BASIC METABOLIC PANEL
Anion gap: 11 (ref 5–15)
Anion gap: 8 (ref 5–15)
Anion gap: 10 (ref 5–15)
Anion gap: 11 (ref 5–15)
BUN: <5 mg/dL — ABNORMAL LOW (ref 8–23)
BUN: <5 mg/dL — ABNORMAL LOW (ref 8–23)
BUN: <5 mg/dL — ABNORMAL LOW (ref 8–23)
BUN: <5 mg/dL — ABNORMAL LOW (ref 8–23)
CO2: 22 mmol/L (ref 22–32)
CO2: 24 mmol/L (ref 22–32)
CO2: 25 mmol/L (ref 22–32)
CO2: 25 mmol/L (ref 22–32)
Calcium: 7.9 mg/dL — ABNORMAL LOW (ref 8.9–10.3)
Calcium: 8 mg/dL — ABNORMAL LOW (ref 8.9–10.3)
Calcium: 8.3 mg/dL — ABNORMAL LOW (ref 8.9–10.3)
Calcium: 8.5 mg/dL — ABNORMAL LOW (ref 8.9–10.3)
Chloride: 95 mmol/L — ABNORMAL LOW (ref 98–111)
Chloride: 95 mmol/L — ABNORMAL LOW (ref 98–111)
Chloride: 95 mmol/L — ABNORMAL LOW (ref 98–111)
Chloride: 96 mmol/L — ABNORMAL LOW (ref 98–111)
Creatinine, Ser: 0.62 mg/dL (ref 0.44–1.00)
Creatinine, Ser: 0.62 mg/dL (ref 0.44–1.00)
Creatinine, Ser: 0.66 mg/dL (ref 0.44–1.00)
Creatinine, Ser: 0.73 mg/dL (ref 0.44–1.00)
GFR, Estimated: >60 mL/min (ref >60)
GFR, Estimated: >60 mL/min (ref >60)
GFR, Estimated: >60 mL/min (ref >60)
GFR, Estimated: >60 mL/min (ref >60)
Glucose, Bld: 92 mg/dL (ref 70–99)
Glucose, Bld: 127 mg/dL — ABNORMAL HIGH (ref 70–99)
Glucose, Bld: 102 mg/dL — ABNORMAL HIGH (ref 70–99)
Glucose, Bld: 113 mg/dL — ABNORMAL HIGH (ref 70–99)
Potassium: 4 mmol/L (ref 3.5–5.1)
Potassium: 3.7 mmol/L (ref 3.5–5.1)
Potassium: 3.9 mmol/L (ref 3.5–5.1)
Potassium: 4.4 mmol/L (ref 3.5–5.1)
Sodium: 128 mmol/L — ABNORMAL LOW (ref 135–145)
Sodium: 127 mmol/L — ABNORMAL LOW (ref 135–145)
Sodium: 130 mmol/L — ABNORMAL LOW (ref 135–145)
Sodium: 132 mmol/L — ABNORMAL LOW (ref 135–145)

## 2023-11-25 LAB — CBC WITH DIFFERENTIAL/PLATELET
Abs Immature Granulocytes: 0.08 10*3/uL — ABNORMAL HIGH (ref 0.00–0.07)
Basophils Absolute: 0.1 10*3/uL (ref 0.0–0.1)
Basophils Relative: 1 %
Eosinophils Absolute: 0 10*3/uL (ref 0.0–0.5)
Eosinophils Relative: 0 %
HCT: 26.9 % — ABNORMAL LOW (ref 36.0–46.0)
Hemoglobin: 9.1 g/dL — ABNORMAL LOW (ref 12.0–15.0)
Immature Granulocytes: 1 %
Lymphocytes Relative: 12 %
Lymphs Abs: 1.3 10*3/uL (ref 0.7–4.0)
MCH: 27.4 pg (ref 26.0–34.0)
MCHC: 33.8 g/dL (ref 30.0–36.0)
MCV: 81 fL (ref 80.0–100.0)
Monocytes Absolute: 0.8 10*3/uL (ref 0.1–1.0)
Monocytes Relative: 7 %
Neutro Abs: 8.7 10*3/uL — ABNORMAL HIGH (ref 1.7–7.7)
Neutrophils Relative %: 79 %
Platelets: 472 10*3/uL — ABNORMAL HIGH (ref 150–400)
RBC: 3.32 MIL/uL — ABNORMAL LOW (ref 3.87–5.11)
RDW: 18.5 % — ABNORMAL HIGH (ref 11.5–15.5)
WBC: 10.9 10*3/uL — ABNORMAL HIGH (ref 4.0–10.5)
nRBC: 0 % (ref 0.0–0.2)

## 2023-11-25 LAB — MAGNESIUM
Magnesium: 1.4 mg/dL — ABNORMAL LOW (ref 1.7–2.4)
Magnesium: 1.4 mg/dL — ABNORMAL LOW (ref 1.7–2.4)

## 2023-11-25 MED ORDER — MAGNESIUM SULFATE 4 GM/100ML IV SOLN
4.0000 g | Freq: Once | INTRAVENOUS | Status: AC
  Administered 2023-11-25: 4 g via INTRAVENOUS

## 2023-11-25 MED ORDER — HYALURONIDASE HUMAN 150 UNIT/ML IJ SOLN
150.0000 [IU] | Freq: Once | INTRAMUSCULAR | Status: AC
  Administered 2023-11-25: 150 [IU] via SUBCUTANEOUS
  Filled 2023-11-25: qty 1

## 2023-11-25 MED ORDER — FUROSEMIDE 10 MG/ML IJ SOLN
40.0000 mg | Freq: Once | INTRAMUSCULAR | Status: AC
  Administered 2023-11-25: 40 mg via INTRAVENOUS
  Filled 2023-11-25: qty 4

## 2023-11-25 MED ORDER — POTASSIUM CHLORIDE CRYS ER 20 MEQ PO TBCR
40.0000 meq | EXTENDED_RELEASE_TABLET | Freq: Once | ORAL | Status: AC
  Administered 2023-11-25: 40 meq via ORAL
  Filled 2023-11-25: qty 2

## 2023-11-25 MED ORDER — ENSURE ENLIVE PO LIQD
237.0000 mL | Freq: Two times a day (BID) | ORAL | Status: DC
  Administered 2023-11-25 – 2023-11-28 (×5): 237 mL via ORAL

## 2023-11-25 NOTE — Progress Notes (Signed)
 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  Patient was admitted with neutropenic fever.  Patient was admitted with absolute neutrophil count of 0.9 and WBC of 2.2.  Patient has been on IV antibiotics.  ANC normalized and abx were completed.  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 -Follow-up with primary care provider and oncology team on discharge.,   Stage IV lung adenocarcinoma with malignant right pleural effusion -Underwent thoracentesis on 11/13/2023 yielding 350 cc of clear yellow fluid. -Fluid analysis consistent with exudate however no different from previous thoracentesis in January -Antibiotics discontinued  -Pleural fluid culture showed no growth  -Follow-up oncology as outpatient   Hyperthyroidism -Continue methimazole 20 mg p.o. daily   Hyperlipidemia -Continue Zocor 20 mg p.o. daily   GERD -Continue Protonix   Sinusitis -Start Flonase, Mucinex, Claritin  Interval History:  Resting comfortably in recliner this morning.  Continues to feel "better" each day she says.  Appetite adequate  and energy and mentation continue to remain normal and improved.  Old records reviewed in assessment of this patient  Antimicrobials:   DVT prophylaxis:  enoxaparin (LOVENOX) injection 40 mg Start: 11/11/23 2200   Code Status:   Code Status: Full Code  Mobility Assessment (Last 72 Hours)     Mobility Assessment     Row Name 11/25/23 0745 11/24/23 2000 11/24/23 0800 11/23/23 2000 11/23/23 1500   Does patient have an order for bedrest or is patient medically unstable No - Continue assessment No - Continue assessment No - Continue assessment No - Continue assessment --   What is the highest level of mobility based on the progressive mobility assessment? Level 5 (Walks with assist in room/hall) - Balance while stepping forward/back and can walk in room with assist - Complete Level 5 (Walks with assist in room/hall) - Balance while stepping forward/back and can walk in room with assist - Complete Level 5 (Walks with assist in room/hall) - Balance while stepping forward/back and can walk in room with assist - Complete Level 5 (Walks with assist in room/hall) - Balance while stepping forward/back and can walk in room with assist - Complete Level 5 (Walks with assist in room/hall) - Balance while stepping forward/back and can walk in room with assist - Complete   Is the above level different from baseline mobility prior to current illness? Yes - Recommend PT order Yes - Recommend PT order Yes - Recommend PT order Yes - Recommend PT order --    Row Name 11/23/23 1400           What is the highest level of mobility based on  the progressive mobility assessment? Level 5 (Walks with assist in room/hall) - Balance while stepping forward/back and can walk in room with assist - Complete                Barriers to discharge: None Disposition Plan:  Home  HH orders placed:  Status is: Inpt  Objective: Blood pressure 116/76, pulse (!) 104, temperature (!) 96.6 F (35.9 C), temperature source  Axillary, resp. rate 15, height 5\' 6"  (1.676 m), weight 70.6 kg, SpO2 97%.  Examination:  Physical Exam Constitutional:      Comments: Resolved lethargy.  Now awake, alert  HENT:     Head: Normocephalic and atraumatic.     Mouth/Throat:     Mouth: Mucous membranes are moist.  Eyes:     Extraocular Movements: Extraocular movements intact.  Cardiovascular:     Rate and Rhythm: Normal rate and regular rhythm.  Pulmonary:     Effort: Pulmonary effort is normal. No respiratory distress.     Breath sounds: Normal breath sounds. No wheezing.  Abdominal:     General: Bowel sounds are normal. There is no distension.     Palpations: Abdomen is soft.     Tenderness: There is no abdominal tenderness.  Musculoskeletal:        General: Normal range of motion.     Cervical back: Normal range of motion and neck supple.  Skin:    General: Skin is warm and dry.  Neurological:     General: No focal deficit present.  Psychiatric:        Mood and Affect: Mood normal.      Consultants:    Procedures:    Data Reviewed: Results for orders placed or performed during the hospital encounter of 11/11/23 (from the past 24 hours)  Basic metabolic panel     Status: Abnormal   Collection Time: 11/24/23  6:20 PM  Result Value Ref Range   Sodium 126 (L) 135 - 145 mmol/L   Potassium 3.1 (L) 3.5 - 5.1 mmol/L   Chloride 93 (L) 98 - 111 mmol/L   CO2 22 22 - 32 mmol/L   Glucose, Bld 113 (H) 70 - 99 mg/dL   BUN <5 (L) 8 - 23 mg/dL   Creatinine, Ser 1.61 0.44 - 1.00 mg/dL   Calcium 7.5 (L) 8.9 - 10.3 mg/dL   GFR, Estimated >09 >60 mL/min   Anion gap 11 5 - 15  Basic metabolic panel     Status: Abnormal   Collection Time: 11/25/23 12:12 AM  Result Value Ref Range   Sodium 128 (L) 135 - 145 mmol/L   Potassium 4.0 3.5 - 5.1 mmol/L   Chloride 95 (L) 98 - 111 mmol/L   CO2 22 22 - 32 mmol/L   Glucose, Bld 92 70 - 99 mg/dL   BUN <5 (L) 8 - 23 mg/dL   Creatinine, Ser 4.54 0.44 - 1.00 mg/dL   Calcium  7.9 (L) 8.9 - 10.3 mg/dL   GFR, Estimated >09 >81 mL/min   Anion gap 11 5 - 15  CBC with Differential/Platelet     Status: Abnormal   Collection Time: 11/25/23  8:07 AM  Result Value Ref Range   WBC 10.9 (H) 4.0 - 10.5 K/uL   RBC 3.32 (L) 3.87 - 5.11 MIL/uL   Hemoglobin 9.1 (L) 12.0 - 15.0 g/dL   HCT 19.1 (L) 47.8 - 29.5 %   MCV 81.0 80.0 - 100.0 fL   MCH 27.4 26.0 - 34.0 pg  MCHC 33.8 30.0 - 36.0 g/dL   RDW 16.1 (H) 09.6 - 04.5 %   Platelets 472 (H) 150 - 400 K/uL   nRBC 0.0 0.0 - 0.2 %   Neutrophils Relative % 79 %   Neutro Abs 8.7 (H) 1.7 - 7.7 K/uL   Lymphocytes Relative 12 %   Lymphs Abs 1.3 0.7 - 4.0 K/uL   Monocytes Relative 7 %   Monocytes Absolute 0.8 0.1 - 1.0 K/uL   Eosinophils Relative 0 %   Eosinophils Absolute 0.0 0.0 - 0.5 K/uL   Basophils Relative 1 %   Basophils Absolute 0.1 0.0 - 0.1 K/uL   Immature Granulocytes 1 %   Abs Immature Granulocytes 0.08 (H) 0.00 - 0.07 K/uL  Magnesium     Status: Abnormal   Collection Time: 11/25/23  8:07 AM  Result Value Ref Range   Magnesium 1.4 (L) 1.7 - 2.4 mg/dL  Basic metabolic panel     Status: Abnormal   Collection Time: 11/25/23  8:07 AM  Result Value Ref Range   Sodium 127 (L) 135 - 145 mmol/L   Potassium 3.7 3.5 - 5.1 mmol/L   Chloride 95 (L) 98 - 111 mmol/L   CO2 24 22 - 32 mmol/L   Glucose, Bld 127 (H) 70 - 99 mg/dL   BUN <5 (L) 8 - 23 mg/dL   Creatinine, Ser 4.09 0.44 - 1.00 mg/dL   Calcium 8.0 (L) 8.9 - 10.3 mg/dL   GFR, Estimated >81 >19 mL/min   Anion gap 8 5 - 15  Basic metabolic panel     Status: Abnormal   Collection Time: 11/25/23 11:38 AM  Result Value Ref Range   Sodium 130 (L) 135 - 145 mmol/L   Potassium 3.9 3.5 - 5.1 mmol/L   Chloride 95 (L) 98 - 111 mmol/L   CO2 25 22 - 32 mmol/L   Glucose, Bld 102 (H) 70 - 99 mg/dL   BUN <5 (L) 8 - 23 mg/dL   Creatinine, Ser 1.47 0.44 - 1.00 mg/dL   Calcium 8.3 (L) 8.9 - 10.3 mg/dL   GFR, Estimated >82 >95 mL/min   Anion gap 10 5 - 15  Magnesium      Status: Abnormal   Collection Time: 11/25/23 11:38 AM  Result Value Ref Range   Magnesium 1.4 (L) 1.7 - 2.4 mg/dL    I have reviewed pertinent nursing notes, vitals, labs, and images as necessary. I have ordered labwork to follow up on as indicated.  I have reviewed the last notes from staff over past 24 hours. I have discussed patient's care plan and test results with nursing staff, CM/SW, and other staff as appropriate.  Critical care time to evaluate and treat this patient was 55 minutes.  Independent of separate billable services  This patient is critically ill with some or all of the following life-threatening issues requiring my presence at the bedside: Hemodynamic instability requiring titration of medications Oxygenation/ventilation instability requiring frequent modifications of support Cardiac rhythm disturbances requiring evaluation and/or interventions Fluctuations in neurologic function requiring evaluation and/or interventions and/or fluid/volume titration    LOS: 14 days   Lewie Chamber, MD [facility] 11/25/2023, 1:54 PM

## 2023-11-26 DIAGNOSIS — E871 Hypo-osmolality and hyponatremia: Secondary | ICD-10-CM | POA: Diagnosis not present

## 2023-11-26 DIAGNOSIS — D709 Neutropenia, unspecified: Secondary | ICD-10-CM | POA: Diagnosis not present

## 2023-11-26 DIAGNOSIS — C3491 Malignant neoplasm of unspecified part of right bronchus or lung: Secondary | ICD-10-CM | POA: Diagnosis not present

## 2023-11-26 DIAGNOSIS — R5081 Fever presenting with conditions classified elsewhere: Secondary | ICD-10-CM | POA: Diagnosis not present

## 2023-11-26 LAB — CBC WITH DIFFERENTIAL/PLATELET
Abs Immature Granulocytes: 0.07 10*3/uL (ref 0.00–0.07)
Basophils Absolute: 0.1 10*3/uL (ref 0.0–0.1)
Basophils Relative: 1 %
Eosinophils Absolute: 0 10*3/uL (ref 0.0–0.5)
Eosinophils Relative: 0 %
HCT: 25.4 % — ABNORMAL LOW (ref 36.0–46.0)
Hemoglobin: 8.4 g/dL — ABNORMAL LOW (ref 12.0–15.0)
Immature Granulocytes: 1 %
Lymphocytes Relative: 12 %
Lymphs Abs: 1.3 10*3/uL (ref 0.7–4.0)
MCH: 27.1 pg (ref 26.0–34.0)
MCHC: 33.1 g/dL (ref 30.0–36.0)
MCV: 81.9 fL (ref 80.0–100.0)
Monocytes Absolute: 0.8 10*3/uL (ref 0.1–1.0)
Monocytes Relative: 7 %
Neutro Abs: 8.6 10*3/uL — ABNORMAL HIGH (ref 1.7–7.7)
Neutrophils Relative %: 79 %
Platelets: 356 10*3/uL (ref 150–400)
RBC: 3.1 MIL/uL — ABNORMAL LOW (ref 3.87–5.11)
RDW: 19 % — ABNORMAL HIGH (ref 11.5–15.5)
WBC: 10.9 10*3/uL — ABNORMAL HIGH (ref 4.0–10.5)
nRBC: 0 % (ref 0.0–0.2)

## 2023-11-26 LAB — BASIC METABOLIC PANEL
Anion gap: 10 (ref 5–15)
Anion gap: 10 (ref 5–15)
Anion gap: 9 (ref 5–15)
Anion gap: 12 (ref 5–15)
BUN: <5 mg/dL — ABNORMAL LOW (ref 8–23)
BUN: <5 mg/dL — ABNORMAL LOW (ref 8–23)
BUN: 5 mg/dL — ABNORMAL LOW (ref 8–23)
BUN: 5 mg/dL — ABNORMAL LOW (ref 8–23)
CO2: 24 mmol/L (ref 22–32)
CO2: 23 mmol/L (ref 22–32)
CO2: 23 mmol/L (ref 22–32)
CO2: 24 mmol/L (ref 22–32)
Calcium: 8.1 mg/dL — ABNORMAL LOW (ref 8.9–10.3)
Calcium: 8.1 mg/dL — ABNORMAL LOW (ref 8.9–10.3)
Calcium: 8.1 mg/dL — ABNORMAL LOW (ref 8.9–10.3)
Calcium: 8.1 mg/dL — ABNORMAL LOW (ref 8.9–10.3)
Chloride: 96 mmol/L — ABNORMAL LOW (ref 98–111)
Chloride: 96 mmol/L — ABNORMAL LOW (ref 98–111)
Chloride: 94 mmol/L — ABNORMAL LOW (ref 98–111)
Chloride: 92 mmol/L — ABNORMAL LOW (ref 98–111)
Creatinine, Ser: 0.65 mg/dL (ref 0.44–1.00)
Creatinine, Ser: 0.69 mg/dL (ref 0.44–1.00)
Creatinine, Ser: 0.69 mg/dL (ref 0.44–1.00)
Creatinine, Ser: 0.75 mg/dL (ref 0.44–1.00)
GFR, Estimated: >60 mL/min (ref >60)
GFR, Estimated: >60 mL/min (ref >60)
GFR, Estimated: >60 mL/min (ref >60)
GFR, Estimated: >60 mL/min (ref >60)
Glucose, Bld: 112 mg/dL — ABNORMAL HIGH (ref 70–99)
Glucose, Bld: 124 mg/dL — ABNORMAL HIGH (ref 70–99)
Glucose, Bld: 95 mg/dL (ref 70–99)
Glucose, Bld: 112 mg/dL — ABNORMAL HIGH (ref 70–99)
Potassium: 3.8 mmol/L (ref 3.5–5.1)
Potassium: 3.9 mmol/L (ref 3.5–5.1)
Potassium: 4.2 mmol/L (ref 3.5–5.1)
Potassium: 3 mmol/L — ABNORMAL LOW (ref 3.5–5.1)
Sodium: 130 mmol/L — ABNORMAL LOW (ref 135–145)
Sodium: 129 mmol/L — ABNORMAL LOW (ref 135–145)
Sodium: 126 mmol/L — ABNORMAL LOW (ref 135–145)
Sodium: 128 mmol/L — ABNORMAL LOW (ref 135–145)

## 2023-11-26 LAB — MAGNESIUM: Magnesium: 2.1 mg/dL (ref 1.7–2.4)

## 2023-11-26 MED ORDER — POTASSIUM CHLORIDE 10 MEQ/100ML IV SOLN
10.0000 meq | INTRAVENOUS | Status: AC
Start: 2023-11-26 — End: 2023-11-27
  Administered 2023-11-26 – 2023-11-27 (×3): 10 meq via INTRAVENOUS
  Filled 2023-11-26 (×3): qty 100

## 2023-11-26 MED ORDER — SODIUM CHLORIDE 1 G PO TABS
2.0000 g | ORAL_TABLET | Freq: Three times a day (TID) | ORAL | Status: DC
  Administered 2023-11-26 – 2023-11-28 (×6): 2 g via ORAL
  Filled 2023-11-26 (×8): qty 2

## 2023-11-26 MED ORDER — FUROSEMIDE 10 MG/ML IJ SOLN
40.0000 mg | Freq: Once | INTRAMUSCULAR | Status: AC
  Administered 2023-11-26: 40 mg via INTRAVENOUS
  Filled 2023-11-26: qty 4

## 2023-11-26 MED ORDER — POTASSIUM CHLORIDE CRYS ER 20 MEQ PO TBCR
60.0000 meq | EXTENDED_RELEASE_TABLET | Freq: Once | ORAL | Status: AC
  Administered 2023-11-26: 60 meq via ORAL
  Filled 2023-11-26: qty 3

## 2023-11-26 MED ORDER — NYSTATIN 100000 UNIT/GM EX POWD
Freq: Two times a day (BID) | CUTANEOUS | Status: DC
  Filled 2023-11-26: qty 15

## 2023-11-26 MED FILL — Fosaprepitant Dimeglumine For IV Infusion 150 MG (Base Eq): INTRAVENOUS | Qty: 5 | Status: AC

## 2023-11-26 NOTE — TOC Initial Note (Signed)
 Transition of Care [facility]) - Initial/Assessment Note    Patient Details  Name: Amanda Potter MRN: [id number] Date of Birth: May 21, 1946  [facility] CM/SW Contact:    Harriet Masson, RN Phone Number: 11/26/2023, 2:41 PM  Clinical Narrative:      Spoke to patient regarding transition needs. Patient lives with son and her 2 daughters live across the street.        Patient has a walker. Family transports her to apts.  Patient declines home health.  PCP confirmed. TOC will continue to follow for needs.       Expected Discharge Plan: Home w Home Health Services Barriers to Discharge: Continued Medical Work up   Patient Goals and CMS Choice Patient states their goals for this hospitalization and ongoing recovery are:: return home          Expected Discharge Plan and Services   Discharge Planning Services: CM Consult   Living arrangements for the past 2 months: Single Family Home                                      Prior Living Arrangements/Services Living arrangements for the past 2 months: Single Family Home Lives with:: Adult Children Patient language and need for interpreter reviewed:: Yes Do you feel safe going back to the place where you live?: Yes      Need for Family Participation in Patient Care: Yes (Comment)   Current home services: DME (walker) Criminal Activity/Legal Involvement Pertinent to Current Situation/Hospitalization: No - Comment as needed  Activities of Daily Living   ADL Screening (condition at time of admission) Independently performs ADLs?: Yes (appropriate for developmental age) Is the patient deaf or have difficulty hearing?: No Does the patient have difficulty seeing, even when wearing glasses/contacts?: No Does the patient have difficulty concentrating, remembering, or making decisions?: No  Permission Sought/Granted                  Emotional Assessment Appearance:: Appears stated  age Attitude/Demeanor/Rapport: Engaged Affect (typically observed): Accepting Orientation: : Oriented to Place, Oriented to Self, Oriented to  Time, Oriented to Situation Alcohol / Substance Use: Not Applicable Psych Involvement: No (comment)  Admission diagnosis:  Generalized weakness [R53.1] Neutropenic fever (HCC) [D70.9, R50.81] Fever, unspecified fever cause [R50.9] Patient Active Problem List   Diagnosis Date Noted   Hypomagnesemia 11/22/2023   Hyponatremia 11/22/2023   Stage IV adenocarcinoma of lung, right (HCC) 11/11/2023   Hypokalemia 11/11/2023   Lung cancer (HCC) 10/23/2023   Malignant pleural effusion 10/03/2023   Pleural effusion on right 09/26/2023   Benign hypertension 04/05/2016   Pure hypertriglyceridemia 04/05/2016   Gastro-esophageal reflux disease with esophagitis 04/05/2016   Incisional hernia, without obstruction or gangrene s/p laparoscopic repair 03/03/13 01/29/2013   PCP:  Richmond Campbell., PA-C Pharmacy:   [id number] - [place], [place] - [address] [place] [address] Phone: [phone] Fax: [phone]     Social Drivers of Health (SDOH) Social History: SDOH Screenings   Food Insecurity: No Food Insecurity (11/12/2023)  Housing: Unknown (11/12/2023)  Transportation Needs: No Transportation Needs (11/12/2023)  Recent Concern: Transportation Needs - Unmet Transportation Needs (10/05/2023)   Received from [facility]  Utilities: Not At Risk (11/12/2023)  Social Connections: Unknown (11/12/2023)  Tobacco Use: Low Risk  (11/11/2023)   SDOH Interventions:  Readmission Risk Interventions    11/12/2023    3:00 PM  Readmission Risk Prevention Plan  Transportation Screening Complete  PCP or Specialist Appt within 5-7 Days Complete  Home Care Screening Complete  Medication Review (RN CM) Complete

## 2023-11-26 NOTE — Progress Notes (Signed)
 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  Patient was admitted with neutropenic fever.  Patient was admitted with absolute neutrophil count of 0.9 and WBC of 2.2.  Patient has been on IV antibiotics.  ANC normalized and abx were completed.  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 -Follow-up with primary care provider and oncology team on discharge.,   Stage IV lung adenocarcinoma with malignant right pleural effusion -Underwent thoracentesis on 11/13/2023 yielding 350 cc of clear yellow fluid. -Fluid analysis consistent with exudate however no different from previous thoracentesis in January -Antibiotics discontinued  -Pleural fluid culture showed no growth  -Follow-up oncology as outpatient   Hyperthyroidism -Continue methimazole 20 mg p.o. daily   Hyperlipidemia -Continue Zocor 20 mg p.o. daily   GERD -Continue Protonix   Sinusitis -Start Flonase, Mucinex, Claritin  Interval History:  Energy and strength remain good and improved.  Mentation remains normal.  Sodium has been coming up the past  couple days.  Old records reviewed in assessment of this patient  Antimicrobials:   DVT prophylaxis:  enoxaparin (LOVENOX) injection 40 mg Start: 11/11/23 2200   Code Status:   Code Status: Full Code  Mobility Assessment (Last 72 Hours)     Mobility Assessment     Row Name 11/26/23 1000 11/26/23 0800 11/25/23 2000 11/25/23 0745 11/24/23 2000   Does patient have an order for bedrest or is patient medically unstable -- No - Continue assessment No - Continue assessment No - Continue assessment No - Continue assessment   What is the highest level of mobility based on the progressive mobility assessment? Level 5 (Walks with assist in room/hall) - Balance while stepping forward/back and can walk in room with assist - Complete Level 5 (Walks with assist in room/hall) - Balance while stepping forward/back and can walk in room with assist - Complete Level 5 (Walks with assist in room/hall) - Balance while stepping forward/back and can walk in room with assist - Complete Level 5 (Walks with assist in room/hall) - Balance while stepping forward/back and can walk in room with assist - Complete Level 5 (Walks with assist in room/hall) - Balance while stepping forward/back and can walk in room with assist - Complete   Is the above level different from baseline mobility prior to current illness? -- Yes - Recommend PT order Yes - Recommend PT order Yes - Recommend PT order Yes - Recommend PT order    Row Name 11/24/23 0800 11/23/23 2000 11/23/23 1500 11/23/23 1400     Does patient have an order for bedrest or is patient medically unstable No - Continue assessment  No - Continue assessment -- --    What is the highest level of mobility based on the progressive mobility assessment? Level 5 (Walks with assist in room/hall) - Balance while stepping forward/back and can walk in room with assist - Complete Level 5 (Walks with assist in room/hall) - Balance while stepping forward/back and can walk in room with  assist - Complete Level 5 (Walks with assist in room/hall) - Balance while stepping forward/back and can walk in room with assist - Complete Level 5 (Walks with assist in room/hall) - Balance while stepping forward/back and can walk in room with assist - Complete    Is the above level different from baseline mobility prior to current illness? Yes - Recommend PT order Yes - Recommend PT order -- --             Barriers to discharge: None Disposition Plan:  Home  HH orders placed:  Status is: Inpt  Objective: Blood pressure 126/82, pulse 100, temperature (!) 97.5 F (36.4 C), temperature source Oral, resp. rate 19, height 5\' 6"  (1.676 m), weight 70.6 kg, SpO2 97%.  Examination:  Physical Exam Constitutional:      Comments: Resolved lethargy.  Now awake, alert  HENT:     Head: Normocephalic and atraumatic.     Mouth/Throat:     Mouth: Mucous membranes are moist.  Eyes:     Extraocular Movements: Extraocular movements intact.  Cardiovascular:     Rate and Rhythm: Normal rate and regular rhythm.  Pulmonary:     Effort: Pulmonary effort is normal. No respiratory distress.     Breath sounds: Normal breath sounds. No wheezing.  Abdominal:     General: Bowel sounds are normal. There is no distension.     Palpations: Abdomen is soft.     Tenderness: There is no abdominal tenderness.  Musculoskeletal:        General: Normal range of motion.     Cervical back: Normal range of motion and neck supple.  Skin:    General: Skin is warm and dry.  Neurological:     General: No focal deficit present.  Psychiatric:        Mood and Affect: Mood normal.      Consultants:    Procedures:    Data Reviewed: Results for orders placed or performed during the hospital encounter of 11/11/23 (from the past 24 hours)  Basic metabolic panel     Status: Abnormal   Collection Time: 11/25/23  6:18 PM  Result Value Ref Range   Sodium 132 (L) 135 - 145 mmol/L   Potassium 4.4 3.5 - 5.1 mmol/L    Chloride 96 (L) 98 - 111 mmol/L   CO2 25 22 - 32 mmol/L   Glucose, Bld 113 (H) 70 - 99 mg/dL   BUN <5 (L) 8 - 23 mg/dL   Creatinine, Ser 1.61 0.44 - 1.00 mg/dL   Calcium 8.5 (L) 8.9 - 10.3 mg/dL   GFR, Estimated >09 >60 mL/min   Anion gap 11 5 - 15  Basic metabolic panel     Status: Abnormal   Collection Time: 11/26/23 12:29 AM  Result Value Ref Range   Sodium 130 (L) 135 - 145 mmol/L   Potassium 3.8 3.5 - 5.1 mmol/L   Chloride 96 (L) 98 - 111 mmol/L   CO2 24 22 - 32 mmol/L   Glucose, Bld 112 (H) 70 - 99 mg/dL   BUN <5 (L) 8 - 23 mg/dL   Creatinine, Ser  0.65 0.44 - 1.00 mg/dL   Calcium 8.1 (L) 8.9 - 10.3 mg/dL   GFR, Estimated >40 >98 mL/min   Anion gap 10 5 - 15  CBC with Differential/Platelet     Status: Abnormal   Collection Time: 11/26/23  8:08 AM  Result Value Ref Range   WBC 10.9 (H) 4.0 - 10.5 K/uL   RBC 3.10 (L) 3.87 - 5.11 MIL/uL   Hemoglobin 8.4 (L) 12.0 - 15.0 g/dL   HCT 11.9 (L) 14.7 - 82.9 %   MCV 81.9 80.0 - 100.0 fL   MCH 27.1 26.0 - 34.0 pg   MCHC 33.1 30.0 - 36.0 g/dL   RDW 56.2 (H) 13.0 - 86.5 %   Platelets 356 150 - 400 K/uL   nRBC 0.0 0.0 - 0.2 %   Neutrophils Relative % 79 %   Neutro Abs 8.6 (H) 1.7 - 7.7 K/uL   Lymphocytes Relative 12 %   Lymphs Abs 1.3 0.7 - 4.0 K/uL   Monocytes Relative 7 %   Monocytes Absolute 0.8 0.1 - 1.0 K/uL   Eosinophils Relative 0 %   Eosinophils Absolute 0.0 0.0 - 0.5 K/uL   Basophils Relative 1 %   Basophils Absolute 0.1 0.0 - 0.1 K/uL   Immature Granulocytes 1 %   Abs Immature Granulocytes 0.07 0.00 - 0.07 K/uL  Magnesium     Status: None   Collection Time: 11/26/23  8:08 AM  Result Value Ref Range   Magnesium 2.1 1.7 - 2.4 mg/dL  Basic metabolic panel     Status: Abnormal   Collection Time: 11/26/23  8:08 AM  Result Value Ref Range   Sodium 129 (L) 135 - 145 mmol/L   Potassium 3.9 3.5 - 5.1 mmol/L   Chloride 96 (L) 98 - 111 mmol/L   CO2 23 22 - 32 mmol/L   Glucose, Bld 124 (H) 70 - 99 mg/dL   BUN <5 (L) 8 -  23 mg/dL   Creatinine, Ser 7.84 0.44 - 1.00 mg/dL   Calcium 8.1 (L) 8.9 - 10.3 mg/dL   GFR, Estimated >69 >62 mL/min   Anion gap 10 5 - 15    I have reviewed pertinent nursing notes, vitals, labs, and images as necessary. I have ordered labwork to follow up on as indicated.  I have reviewed the last notes from staff over past 24 hours. I have discussed patient's care plan and test results with nursing staff, CM/SW, and other staff as appropriate.  Critical care time to evaluate and treat this patient was 55 minutes.  Independent of separate billable services  This patient is critically ill with some or all of the following life-threatening issues requiring my presence at the bedside: Hemodynamic instability requiring titration of medications Oxygenation/ventilation instability requiring frequent modifications of support Cardiac rhythm disturbances requiring evaluation and/or interventions Fluctuations in neurologic function requiring evaluation and/or interventions and/or fluid/volume titration    LOS: 15 days   Lewie Chamber, MD [facility] 11/26/2023, 1:01 PM

## 2023-11-26 NOTE — Progress Notes (Signed)
 Occupational Therapy Treatment Patient Details Name: Amanda Potter MRN: [id number] DOB: [birth year] Today's Date: 11/26/2023   History of present illness 78 yo F admitted 11/11/23 with neutropenic fever. 2/18 Rt thoracentesis. 2/27 Transferred to ICU for severe hyponatremia.  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 Pt reports stomach discomfort after eating this AM. Pt CGA-min A for simulated toilet transfer and functional mobility in room/hall with RW. Pt presenting with impairments listed below, will follow acutely. Continue to recommend HHOT at d/c given pt has assist from family.  BP pre-session 124/73 (98) BP post-session 131/67 (86)      If plan is discharge home, recommend the following:  A little help with walking and/or transfers;A little help with bathing/dressing/bathroom;Assistance with cooking/housework;Help with stairs or ramp for entrance;Assist for transportation   Equipment Recommendations  BSC/3in1;Other (comment) (RW)    Recommendations for Other Services      Precautions / Restrictions Precautions Precautions: Fall Precaution/Restrictions Comments: monitor O2 and HR Restrictions Weight Bearing Restrictions Per Provider Order: No       Mobility Bed Mobility Overal bed mobility: Needs Assistance Bed Mobility: Sit to Supine     Supine to sit: Supervision     General bed mobility comments: incr time    Transfers Overall transfer level: Needs assistance Equipment used: Rolling walker (2 wheels) Transfers: Sit to/from Stand Sit to Stand: Contact guard assist                 Balance Overall balance assessment: Needs assistance Sitting-balance support: No upper extremity supported, Feet supported Sitting balance-Leahy Scale: Good     Standing balance  support: Bilateral upper extremity supported, Reliant on assistive device for balance Standing balance-Leahy Scale: Poor Standing balance comment: RW for ambulation                           ADL either performed or assessed with clinical judgement   ADL Overall ADL's : Needs assistance/impaired                         Toilet Transfer: Minimal assistance;Ambulation;Regular Toilet;Rolling walker (2 wheels) Toilet Transfer Details (indicate cue type and reason): simulated via functional mobility         Functional mobility during ADLs: Minimal assistance;Rolling walker (2 wheels)      Extremity/Trunk Assessment Upper Extremity Assessment Upper Extremity Assessment: Generalized weakness   Lower Extremity Assessment Lower Extremity Assessment: Defer to PT evaluation        Vision   Vision Assessment?: No apparent visual deficits   Perception Perception Perception: Within Functional Limits   Praxis Praxis Praxis: WFL   Communication Communication Communication: No apparent difficulties   Cognition Arousal: Lethargic Behavior During Therapy: WFL for tasks assessed/performed Cognition: No apparent impairments             OT - Cognition Comments: slow resposes and intermittently closing eyes during session                 Following commands: Intact        Cueing   Cueing Techniques: Verbal cues  Exercises      Shoulder Instructions       General Comments VSS on RA    Pertinent Vitals/ Pain  Pain Assessment Pain Assessment: Faces Pain Score: 2  Faces Pain Scale: Hurts a little bit Pain Location: stomach Pain Descriptors / Indicators: Discomfort Pain Intervention(s): Limited activity within patient's tolerance, Monitored during session, Repositioned  Home Living                                          Prior Functioning/Environment              Frequency  Min 2X/week        Progress  Toward Goals  OT Goals(current goals can now be found in the care plan section)  Progress towards OT goals: Progressing toward goals  Acute Rehab OT Goals Patient Stated Goal: to get better OT Goal Formulation: With patient Time For Goal Achievement: 12/10/23 Potential to Achieve Goals: Good ADL Goals Pt Will Transfer to Toilet: with modified independence;ambulating;regular height toilet Pt Will Perform Tub/Shower Transfer: Tub transfer;Shower transfer;with modified independence;ambulating Additional ADL Goal #1: pt will tolerate OOB activity x10 min in order to improve activity tolerance for ADLs  Plan      Co-evaluation                 AM-PAC OT "6 Clicks" Daily Activity     Outcome Measure   Help from another person eating meals?: A Little Help from another person taking care of personal grooming?: A Little Help from another person toileting, which includes using toliet, bedpan, or urinal?: A Lot Help from another person bathing (including washing, rinsing, drying)?: A Lot Help from another person to put on and taking off regular upper body clothing?: A Little Help from another person to put on and taking off regular lower body clothing?: A Lot 6 Click Score: 15    End of Session Equipment Utilized During Treatment: Gait belt;Rolling walker (2 wheels)  OT Visit Diagnosis: Unsteadiness on feet (R26.81);Muscle weakness (generalized) (M62.81)   Activity Tolerance Patient tolerated treatment well   Patient Left in chair;with call bell/phone within reach;with chair alarm set   Nurse Communication Mobility status        Time: 8295-6213 OT Time Calculation (min): 19 min  Charges: OT General Charges $OT Visit: 1 Visit OT Treatments $Therapeutic Activity: 8-22 mins  Carver Fila, OTD, OTR/L SecureChat Preferred Acute Rehab [phone]    Verna Desrocher K Koonce 11/26/2023, 10:50 AM

## 2023-11-26 NOTE — Progress Notes (Signed)
 PT Cancellation Note  Patient Details Name: Amanda Potter MRN: [id number] DOB: 1946/09/20   Cancelled Treatment:    Reason Eval/Treat Not Completed: Patient declined, no reason specified (attempted to see 2x. at 1040 just finished with OT, upon return at 1230 pt fatigued and denied mobility)   Chrisann Melaragno B Octavion Mollenkopf 11/26/2023, 12:29 PM Merryl Hacker, PT Acute Rehabilitation Services Office: [phone]

## 2023-11-27 ENCOUNTER — Inpatient Hospital Stay: Payer: Self-pay | Admitting: Dietician

## 2023-11-27 ENCOUNTER — Inpatient Hospital Stay: Payer: 59

## 2023-11-27 ENCOUNTER — Inpatient Hospital Stay: Payer: Medicare Other

## 2023-11-27 ENCOUNTER — Other Ambulatory Visit: Payer: Self-pay

## 2023-11-27 ENCOUNTER — Ambulatory Visit: Payer: Self-pay

## 2023-11-27 ENCOUNTER — Encounter: Payer: Self-pay | Admitting: Internal Medicine

## 2023-11-27 ENCOUNTER — Inpatient Hospital Stay: Payer: 59 | Admitting: Internal Medicine

## 2023-11-27 DIAGNOSIS — R5081 Fever presenting with conditions classified elsewhere: Secondary | ICD-10-CM | POA: Diagnosis not present

## 2023-11-27 DIAGNOSIS — D709 Neutropenia, unspecified: Secondary | ICD-10-CM | POA: Diagnosis not present

## 2023-11-27 DIAGNOSIS — C3491 Malignant neoplasm of unspecified part of right bronchus or lung: Secondary | ICD-10-CM | POA: Diagnosis not present

## 2023-11-27 DIAGNOSIS — E871 Hypo-osmolality and hyponatremia: Secondary | ICD-10-CM | POA: Diagnosis not present

## 2023-11-27 LAB — BASIC METABOLIC PANEL
Anion gap: 10 (ref 5–15)
Anion gap: 9 (ref 5–15)
Anion gap: 7 (ref 5–15)
BUN: 6 mg/dL — ABNORMAL LOW (ref 8–23)
BUN: 7 mg/dL — ABNORMAL LOW (ref 8–23)
BUN: 8 mg/dL (ref 8–23)
CO2: 23 mmol/L (ref 22–32)
CO2: 24 mmol/L (ref 22–32)
CO2: 26 mmol/L (ref 22–32)
Calcium: 8.1 mg/dL — ABNORMAL LOW (ref 8.9–10.3)
Calcium: 8.1 mg/dL — ABNORMAL LOW (ref 8.9–10.3)
Calcium: 8.2 mg/dL — ABNORMAL LOW (ref 8.9–10.3)
Chloride: 97 mmol/L — ABNORMAL LOW (ref 98–111)
Chloride: 95 mmol/L — ABNORMAL LOW (ref 98–111)
Chloride: 95 mmol/L — ABNORMAL LOW (ref 98–111)
Creatinine, Ser: 0.71 mg/dL (ref 0.44–1.00)
Creatinine, Ser: 0.81 mg/dL (ref 0.44–1.00)
Creatinine, Ser: 0.86 mg/dL (ref 0.44–1.00)
GFR, Estimated: >60 mL/min (ref >60)
GFR, Estimated: >60 mL/min (ref >60)
GFR, Estimated: >60 mL/min (ref >60)
Glucose, Bld: 96 mg/dL (ref 70–99)
Glucose, Bld: 127 mg/dL — ABNORMAL HIGH (ref 70–99)
Glucose, Bld: 145 mg/dL — ABNORMAL HIGH (ref 70–99)
Potassium: 4.6 mmol/L (ref 3.5–5.1)
Potassium: 4 mmol/L (ref 3.5–5.1)
Potassium: 4 mmol/L (ref 3.5–5.1)
Sodium: 130 mmol/L — ABNORMAL LOW (ref 135–145)
Sodium: 128 mmol/L — ABNORMAL LOW (ref 135–145)
Sodium: 128 mmol/L — ABNORMAL LOW (ref 135–145)

## 2023-11-27 LAB — MAGNESIUM: Magnesium: 1.5 mg/dL — ABNORMAL LOW (ref 1.7–2.4)

## 2023-11-27 MED ORDER — MAGNESIUM SULFATE 4 GM/100ML IV SOLN
4.0000 g | Freq: Once | INTRAVENOUS | Status: AC
  Administered 2023-11-27: 4 g via INTRAVENOUS
  Filled 2023-11-27: qty 100

## 2023-11-27 MED ORDER — FUROSEMIDE 10 MG/ML IJ SOLN
20.0000 mg | Freq: Once | INTRAMUSCULAR | Status: AC
  Administered 2023-11-27: 20 mg via INTRAVENOUS
  Filled 2023-11-27: qty 2

## 2023-11-27 NOTE — Progress Notes (Signed)
 Patient transferred to [facility]. Vitals and weight completed. Two RNs completed skin assessment. CCMD notified of transfer.

## 2023-11-27 NOTE — Progress Notes (Signed)
 Physical Therapy Treatment Patient Details Name: Amanda Potter MRN: [id number] DOB: Apr 17, 1946 Today's Date: 11/27/2023   History of Present Illness 78 yo F admitted 11/11/23 with neutropenic fever. 2/18 Rt thoracentesis. 2/27 Transferred to ICU for severe hyponatremia.  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 Pt continues to benefit from cues for posture when mobilizing. Pt also continues to report energy levels. PT continues to recommend HHPT at the time of discharge, however the pt is declining these services during this treatment session.    If plan is discharge home, recommend the following: A little help with bathing/dressing/bathroom;Assistance with cooking/housework;Assist for transportation;Help with stairs or ramp for entrance   Can travel by private vehicle        Equipment Recommendations  None recommended by PT    Recommendations for Other Services       Precautions / Restrictions Precautions Precautions: Fall Recall of Precautions/Restrictions: Intact Precaution/Restrictions Comments: monitor O2 and HR Restrictions Weight Bearing Restrictions Per Provider Order: No     Mobility  Bed Mobility Overal bed mobility: Needs Assistance Bed Mobility: Supine to Sit     Supine to sit: Supervision          Transfers Overall transfer level: Needs assistance Equipment used: Rolling walker (2 wheels) Transfers: Sit to/from Stand Sit to Stand: Supervision                Ambulation/Gait Ambulation/Gait assistance: Contact guard assist Gait Distance (Feet): 100 Feet (additional trials of 10' and 20') Assistive device: Rolling walker (2 wheels) Gait Pattern/deviations: Step-through pattern Gait velocity: reduced Gait velocity interpretation: <1.8 ft/sec,  indicate of risk for recurrent falls   General Gait Details: steady step-through gait, increase in trunk flexion initially but pt is able to correct with verbal cues for posture and DME management   Stairs             Wheelchair Mobility     Tilt Bed    Modified Rankin (Stroke Patients Only)       Balance Overall balance assessment: Needs assistance Sitting-balance support: No upper extremity supported, Feet supported Sitting balance-Leahy Scale: Good     Standing balance support: Single extremity supported, Reliant on assistive device for balance Standing balance-Leahy Scale: Poor                              Communication Communication Communication: No apparent difficulties  Cognition Arousal: Alert Behavior During Therapy: WFL for tasks assessed/performed   PT - Cognitive impairments: No apparent impairments                         Following commands: Intact      Cueing Cueing Techniques: Verbal cues  Exercises      General Comments General comments (skin integrity, edema, etc.): tachycardic into 120s with activity. SpO2 stable on room air      Pertinent Vitals/Pain Pain Assessment Pain Assessment: No/denies pain    Home Living                          Prior Function            PT Goals (current goals can now be found in the care plan section) Acute Rehab PT  Goals Patient Stated Goal: to have more energy PT Goal Formulation: With patient Time For Goal Achievement: 12/11/23 Potential to Achieve Goals: Good Progress towards PT goals: Progressing toward goals    Frequency    Min 1X/week      PT Plan      Co-evaluation              AM-PAC PT "6 Clicks" Mobility   Outcome Measure  Help needed turning from your back to your side while in a flat bed without using bedrails?: A Little Help needed moving from lying on your back to sitting on the side of a flat bed without using bedrails?: A  Little Help needed moving to and from a bed to a chair (including a wheelchair)?: A Little Help needed standing up from a chair using your arms (e.g., wheelchair or bedside chair)?: A Little Help needed to walk in hospital room?: A Little Help needed climbing 3-5 steps with a railing? : A Little 6 Click Score: 18    End of Session Equipment Utilized During Treatment: Gait belt Activity Tolerance: Patient tolerated treatment well Patient left: in chair;with call bell/phone within reach;with chair alarm set Nurse Communication: Mobility status PT Visit Diagnosis: Muscle weakness (generalized) (M62.81);Other abnormalities of gait and mobility (R26.89);Unsteadiness on feet (R26.81)     Time: 6045-4098 PT Time Calculation (min) (ACUTE ONLY): 19 min  Charges:    $Gait Training: 8-22 mins PT General Charges $$ ACUTE PT VISIT: 1 Visit                     Arlyss Gandy, PT, DPT Acute Rehabilitation Office [phone]    Arlyss Gandy 11/27/2023, 10:02 AM

## 2023-11-27 NOTE — Progress Notes (Signed)
 Patient transferred to 3E25 via wheelchair with monitor and I have called her daughter and updated her of transfer

## 2023-11-27 NOTE — Progress Notes (Signed)
 Progress Note    Amanda Potter   YQM:[id number]  DOB: 05-04-1946  DOA: 11/11/2023     16 PCP: Richmond Campbell., PA-C  Initial CC: fever   Hospital Course: Patient is a 78 year old female, with medical history significant for recently diagnosed stage IV lung adenocarcinoma with malignant right pleural effusion (s/p first treatment Keytruda, Alimta, carboplatin on 10/30/2023), hyperthyroidism, HLD, and GERD.  Patient was admitted with neutropenic fever.  Patient was admitted with absolute neutrophil count of 0.9 and WBC of 2.2.  Patient has been on IV antibiotics.  ANC normalized and abx were completed.  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 -Follow-up with primary care provider and oncology team on discharge.,   Stage IV lung adenocarcinoma with malignant right pleural effusion -Underwent thoracentesis on 11/13/2023 yielding 350 cc of clear yellow fluid. -Fluid analysis consistent with exudate however no different from previous thoracentesis in January -Antibiotics discontinued  -Pleural fluid culture showed no growth  -Follow-up oncology as outpatient   Hyperthyroidism -Continue methimazole 20 mg p.o. daily   Hyperlipidemia -Continue Zocor 20 mg p.o. daily   GERD -Continue Protonix   Sinusitis -Start Flonase, Mucinex, Claritin  Interval History:  No events overnight.  Ambulating in the unit when seen this morning with  therapy.  Old records reviewed in assessment of this patient  Antimicrobials:   DVT prophylaxis:  enoxaparin (LOVENOX) injection 40 mg Start: 11/11/23 2200   Code Status:   Code Status: Full Code  Mobility Assessment (Last 72 Hours)     Mobility Assessment     Row Name 11/27/23 0926 11/26/23 2000 11/26/23 1000 11/26/23 0800 11/25/23 2000   Does patient have an order for bedrest or is patient medically unstable -- No - Continue assessment -- No - Continue assessment No - Continue assessment   What is the highest level of mobility based on the progressive mobility assessment? Level 5 (Walks with assist in room/hall) - Balance while stepping forward/back and can walk in room with assist - Complete Level 5 (Walks with assist in room/hall) - Balance while stepping forward/back and can walk in room with assist - Complete Level 5 (Walks with assist in room/hall) - Balance while stepping forward/back and can walk in room with assist - Complete Level 5 (Walks with assist in room/hall) - Balance while stepping forward/back and can walk in room with assist - Complete Level 5 (Walks with assist in room/hall) - Balance while stepping forward/back and can walk in room with assist - Complete   Is the above level different from baseline mobility prior to current illness? -- Yes - Recommend PT order -- Yes - Recommend PT order Yes - Recommend PT order    Row Name 11/25/23 0745 11/24/23 2000         Does patient have an order for bedrest or is patient medically unstable No - Continue assessment No - Continue assessment      What is the  highest level of mobility based on the progressive mobility assessment? Level 5 (Walks with assist in room/hall) - Balance while stepping forward/back and can walk in room with assist - Complete Level 5 (Walks with assist in room/hall) - Balance while stepping forward/back and can walk in room with assist - Complete      Is the above level different from baseline mobility  prior to current illness? Yes - Recommend PT order Yes - Recommend PT order               Barriers to discharge: None Disposition Plan:  Home  HH orders placed:  Status is: Inpt  Objective: Blood pressure 107/63, pulse 90, temperature (!) 97.4 F (36.3 C), temperature source Oral, resp. rate 18, height 5\' 6"  (1.676 m), weight 70.6 kg, SpO2 97%.  Examination:  Physical Exam Constitutional:      Comments: Resolved lethargy.  Now awake, alert  HENT:     Head: Normocephalic and atraumatic.     Mouth/Throat:     Mouth: Mucous membranes are moist.  Eyes:     Extraocular Movements: Extraocular movements intact.  Cardiovascular:     Rate and Rhythm: Normal rate and regular rhythm.  Pulmonary:     Effort: Pulmonary effort is normal. No respiratory distress.     Breath sounds: Normal breath sounds. No wheezing.  Abdominal:     General: Bowel sounds are normal. There is no distension.     Palpations: Abdomen is soft.     Tenderness: There is no abdominal tenderness.  Musculoskeletal:        General: Normal range of motion.     Cervical back: Normal range of motion and neck supple.  Skin:    General: Skin is warm and dry.  Neurological:     General: No focal deficit present.  Psychiatric:        Mood and Affect: Mood normal.      Consultants:    Procedures:    Data Reviewed: Results for orders placed or performed during the hospital encounter of 11/11/23 (from the past 24 hours)  Basic metabolic panel     Status: Abnormal   Collection Time: 11/26/23  9:02 PM  Result Value Ref Range   Sodium 128 (L) 135 - 145 mmol/L   Potassium 3.0 (L) 3.5 - 5.1 mmol/L   Chloride 92 (L) 98 - 111 mmol/L   CO2 24 22 - 32 mmol/L   Glucose, Bld 112 (H) 70 - 99 mg/dL   BUN 5 (L) 8 - 23 mg/dL   Creatinine, Ser 1.61 0.44 - 1.00 mg/dL   Calcium 8.1 (L) 8.9 - 10.3 mg/dL   GFR, Estimated >09 >60 mL/min   Anion gap 12 5 - 15  Basic metabolic panel     Status: Abnormal   Collection Time:  11/27/23  3:03 AM  Result Value Ref Range   Sodium 130 (L) 135 - 145 mmol/L   Potassium 4.6 3.5 - 5.1 mmol/L   Chloride 97 (L) 98 - 111 mmol/L   CO2 23 22 - 32 mmol/L   Glucose, Bld 96 70 - 99 mg/dL   BUN 6 (L) 8 - 23 mg/dL   Creatinine, Ser 4.54 0.44 - 1.00 mg/dL   Calcium 8.1 (L) 8.9 - 10.3 mg/dL   GFR, Estimated >09 >81 mL/min   Anion gap 10 5 - 15  Magnesium     Status: Abnormal   Collection Time: 11/27/23  3:07 AM  Result Value Ref Range  Magnesium 1.5 (L) 1.7 - 2.4 mg/dL  Basic metabolic panel     Status: Abnormal   Collection Time: 11/27/23 11:56 AM  Result Value Ref Range   Sodium 128 (L) 135 - 145 mmol/L   Potassium 4.0 3.5 - 5.1 mmol/L   Chloride 95 (L) 98 - 111 mmol/L   CO2 24 22 - 32 mmol/L   Glucose, Bld 127 (H) 70 - 99 mg/dL   BUN 7 (L) 8 - 23 mg/dL   Creatinine, Ser 9.98 0.44 - 1.00 mg/dL   Calcium 8.1 (L) 8.9 - 10.3 mg/dL   GFR, Estimated >33 >82 mL/min   Anion gap 9 5 - 15    I have reviewed pertinent nursing notes, vitals, labs, and images as necessary. I have ordered labwork to follow up on as indicated.  I have reviewed the last notes from staff over past 24 hours. I have discussed patient's care plan and test results with nursing staff, CM/SW, and other staff as appropriate.  Time spent: Greater than 50% of the 55 minute visit was spent in counseling/coordination of care for the patient as laid out in the A&P.    LOS: 16 days   Lewie Chamber, MD [facility] 11/27/2023, 2:42 PM

## 2023-11-28 DIAGNOSIS — D709 Neutropenia, unspecified: Secondary | ICD-10-CM

## 2023-11-28 DIAGNOSIS — E871 Hypo-osmolality and hyponatremia: Secondary | ICD-10-CM | POA: Diagnosis not present

## 2023-11-28 DIAGNOSIS — E785 Hyperlipidemia, unspecified: Secondary | ICD-10-CM

## 2023-11-28 DIAGNOSIS — C3491 Malignant neoplasm of unspecified part of right bronchus or lung: Secondary | ICD-10-CM | POA: Diagnosis not present

## 2023-11-28 DIAGNOSIS — E0590 Thyrotoxicosis, unspecified without thyrotoxic crisis or storm: Secondary | ICD-10-CM | POA: Insufficient documentation

## 2023-11-28 LAB — BASIC METABOLIC PANEL
Anion gap: 9 (ref 5–15)
Anion gap: 9 (ref 5–15)
Anion gap: 12 (ref 5–15)
BUN: 10 mg/dL (ref 8–23)
BUN: 9 mg/dL (ref 8–23)
BUN: 12 mg/dL (ref 8–23)
CO2: 25 mmol/L (ref 22–32)
CO2: 28 mmol/L (ref 22–32)
CO2: 25 mmol/L (ref 22–32)
Calcium: 8.2 mg/dL — ABNORMAL LOW (ref 8.9–10.3)
Calcium: 8.3 mg/dL — ABNORMAL LOW (ref 8.9–10.3)
Calcium: 8.3 mg/dL — ABNORMAL LOW (ref 8.9–10.3)
Chloride: 96 mmol/L — ABNORMAL LOW (ref 98–111)
Chloride: 93 mmol/L — ABNORMAL LOW (ref 98–111)
Chloride: 94 mmol/L — ABNORMAL LOW (ref 98–111)
Creatinine, Ser: 0.78 mg/dL (ref 0.44–1.00)
Creatinine, Ser: 0.8 mg/dL (ref 0.44–1.00)
Creatinine, Ser: 0.97 mg/dL (ref 0.44–1.00)
GFR, Estimated: >60 mL/min (ref >60)
GFR, Estimated: >60 mL/min (ref >60)
GFR, Estimated: 59 mL/min — ABNORMAL LOW (ref >60)
Glucose, Bld: 100 mg/dL — ABNORMAL HIGH (ref 70–99)
Glucose, Bld: 101 mg/dL — ABNORMAL HIGH (ref 70–99)
Glucose, Bld: 93 mg/dL (ref 70–99)
Potassium: 3.6 mmol/L (ref 3.5–5.1)
Potassium: 3.7 mmol/L (ref 3.5–5.1)
Potassium: 3.6 mmol/L (ref 3.5–5.1)
Sodium: 130 mmol/L — ABNORMAL LOW (ref 135–145)
Sodium: 130 mmol/L — ABNORMAL LOW (ref 135–145)
Sodium: 131 mmol/L — ABNORMAL LOW (ref 135–145)

## 2023-11-28 LAB — MAGNESIUM: Magnesium: 1.9 mg/dL (ref 1.7–2.4)

## 2023-11-28 MED ORDER — MAGNESIUM SULFATE 2 GM/50ML IV SOLN
2.0000 g | Freq: Once | INTRAVENOUS | Status: AC
  Administered 2023-11-28: 2 g via INTRAVENOUS
  Filled 2023-11-28: qty 50

## 2023-11-28 MED ORDER — ENSURE ENLIVE PO LIQD: 237.0000 mL | Freq: Two times a day (BID) | ORAL | 0 refills | Status: AC

## 2023-11-28 NOTE — Discharge Summary (Addendum)
 Physician Discharge Summary   Patient: Amanda Potter MRN: [id number] DOB: 1945-12-11  Admit date:     11/11/2023  Discharge date: 11/28/23  Discharge Physician: Amanda Potter   PCP: Amanda Campbell., PA-C   Recommendations at discharge:   Continue fluids restrictions 1500 ml per day.   Patient will need follow up cell count and electrolytes in 7 days as outpatient. Follow up with Amanda Gemma PA C in 7 to 10 days.  Follow up with Oncology as outpatient.  She declined home health services.   I spoke with patient's dughter at the bedside, we talked in detail about patient's condition, plan of care and prognosis and all questions were addressed.   Discharge Diagnoses: Active Problems:   Neutropenic fever (HCC)   Hyponatremia   Stage IV adenocarcinoma of lung, right (HCC)   Hyperthyroidism   Dyslipidemia  Principal Problem (Resolved):   Neutropenic fever [facility])  Hospital Course: Amanda Potter was admitted to the hospital with the working diagnosis of neutropenic fever complicated with hyponatremia.    78 year old female, with medical history significant for recently diagnosed stage IV lung adenocarcinoma with malignant right pleural effusion (s/p first treatment Keytruda, Alimta, carboplatin on 10/30/2023), hyperthyroidism, HLD, and GERD who presented with fever and nausea.  Patient was noted to have significant generalized weakness, fatigue and fever on the day of hospitalization.  On her initial physical examination her blood pressure was 98/54, HR 117, RR 16 and 02 saturation 95% on room air.  Lungs with decreased breath sounds more on the right with no wheezing or rhonchi, heart with S1 and S2 present and regular with no gallops, rubs or murmurs, abdomen with no distention and no lower extremity edema, neurologically she was non focal.   Na 130 K 2,9 Cl 94 bicarbonate 26 glucose 116 b un 5 cr 0,76 MG 1,3  Lactic acid 1,5  Wbc 2.2 hgb 8,3 plt 236  Sars covid 19  negative Influenza negative  Urine analysis SG 1,004. Protein negative, leukocytes trace and hgb negative   Chest radiograph with cardiomegaly with positive right layering pleural effusion.  CT chest with loculated right pleural effusion, compression atelectasis, fibrotic changes at bases.  No evidence of pulmonary artery embolism.  Small pocket of air within the posterior right lung base pleural effusion may be residual from prior pneumothorax.   EKG 96 bpm, normal axis, normal intervals, sinus rhythm with no significant ST segment or  T wave changes.   Patient was placed on antibiotic therapy for neutropenic fever.   02/28 right sided thoracentesis 350 ml fluid drained.  Patient had worsening hyponatremia with IV fluids, required hypertonic saline infusion 3% with good toleration.  Neutropenia improved, and antibiotic therapy was discontinued.     Assessment and Plan: Neutropenic fever (HCC) Patient was placed on broad spectrum antibiotic therapy with IV cefepime.  Blood cultures no growth.  Pleural fluid analysis, exudate per protein ration, with 24 nucleated cells. 72% lymphocytes.  Fluid culture with no growth and with no organisms seen.   Patient had no further neutropenia or fever.  Antibiotic therapy was discontinued with good toleration.  At the time of her discharge her wbc is 10,9 with hgb 8,4 and plt 356 .  Plan to continue cell count follow up as outpatient.   Hyponatremia Hypokalemia and hypomagnesemia.   Patient had severe hyponatremia down to 117 after IV fluids challenge with normal saline.  Patient was transferred to the ICU. Urine osmolality was 479, patient had furosemide for diuresis  with good toleration.  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  At the time of her discharge her serum Na is 131, K 3,6 and Cl 94  Bicarbonate at 25 and Mg 1.9   Patient will have close follow up renal function and electrolytes.  Fluid restriction  1500 ml per day.  Hold on salt tablets at the time of her discharge.   Stage IV adenocarcinoma of lung, right (HCC) Right pleural effusion, transudate.  Cytology with mixed inflammation and mesothelial cells, no malignant cells identified.  Patient will follow up as outpatient with oncology.   Anemia of chronic disease with iron deficiency. Continue iron supplementation, follow up cell count as outpatient.   Hyperthyroidism Continue methimazole.   Dyslipidemia Continue with simvastatin    Consultants: none  Procedures performed: none   Disposition: Home Diet recommendation:  Discharge Diet Orders (From admission, onward)     Start     Ordered   11/28/23 0000  Diet - low sodium heart healthy        11/28/23 1315           Cardiac diet DISCHARGE MEDICATION: Allergies as of 11/28/2023       Reactions   Latex Rash   Tape Rash   Glue on tape        Medication List     STOP taking these medications    lidocaine-prilocaine cream Commonly known as: EMLA       TAKE these medications    albuterol 108 (90 Base) MCG/ACT inhaler Commonly known as: VENTOLIN HFA Inhale 1 puff into the lungs every 6 (six) hours as needed for shortness of breath.   aspirin 81 MG tablet Take 81 mg by mouth daily at 12 noon.   diphenhydrAMINE 25 MG tablet Commonly known as: BENADRYL Take 25 mg by mouth daily as needed for allergies.   feeding supplement Liqd Take 237 mLs by mouth 2 (two) times daily between meals.   folic acid 1 MG tablet Commonly known as: FOLVITE Take 1 tablet (1 mg total) by mouth daily.   methimazole 10 MG tablet Commonly known as: TAPAZOLE Take 2 tablets (20 mg total) by mouth daily.   omeprazole 20 MG capsule Commonly known as: PRILOSEC Take 20 mg by mouth daily.   prochlorperazine 10 MG tablet Commonly known as: COMPAZINE Take 1 tablet (10 mg total) by mouth every 6 (six) hours as needed. What changed: reasons to take this   simvastatin 20 MG  tablet Commonly known as: ZOCOR Take 20 mg by mouth at bedtime.   SLOW FE PO Take 1 tablet by mouth daily.   Vitamin D (Ergocalciferol) 1.25 MG (50000 UNIT) Caps capsule Commonly known as: DRISDOL Take 50,000 Units by mouth once a week.        Discharge Exam: Filed Weights   11/11/23 1542 11/27/23 1425 11/28/23 0312  Weight: 70.6 kg 68.3 kg 67.6 kg   BP 109/70 (BP Location: Right Arm)   Pulse 100   Temp 98 F (36.7 C) (Oral)   Resp 16   Ht 5\' 6"  (1.676 m)   Wt 67.6 kg   SpO2 96%   BMI 24.05 kg/m   Patient is feeling better, no chest pain or dyspnea, no nausea or vomiting, no abdominal pain and tolerating po well.   Neurology awake and alert ENT with mild pallor  Cardiovascular with S1 and S2 present and regular with no gallops, rubs or murmurs Respiratory with no rales or  wheezing, no rhonchi Abdomen with no distention  No lower extremity edema   Condition at discharge: stable  The results of significant diagnostics from this hospitalization (including imaging, microbiology, ancillary and laboratory) are listed below for reference.   Imaging Studies: DG Chest 1 View Result Date: 11/13/2023 CLINICAL DATA:  [id number] S/P thoracentesis [id number] EXAM: PORTABLE CHEST 1 VIEW COMPARISON:  Chest XR, 11/11/2023. IR thoracentesis, earlier same day. CT chest, 11/12/2023. FINDINGS: Cardiomegaly. Aortic arch atherosclerosis. The LEFT lung is well inflated. Similar degree of aeration of the RIGHT lung post thoracentesis with small volume residual pleural effusion. No pneumothorax. Persistent pleural thickening and patchy RIGHT basilar opacities. No interval osseous abnormality. IMPRESSION: 1. No pneumothorax with small volume residual pleural effusion post RIGHT thoracentesis. 2. Cardiomegaly and Aortic Atherosclerosis (ICD10-I70.0). Electronically Signed   By: Roanna Banning M.D.   On: 11/13/2023 13:34   IR THORACENTESIS ASP PLEURAL SPACE W/IMG GUIDE Result Date: 11/13/2023 INDICATION:  Stage IV adenocarcinoma of the lung with malignant pleural effusion. Request for diagnostic and therapeutic thoracentesis. EXAM: ULTRASOUND GUIDED RIGHT THORACENTESIS MEDICATIONS: 1% lidocaine 10 mL COMPLICATIONS: None immediate. PROCEDURE: An ultrasound guided thoracentesis was thoroughly discussed with the patient and questions answered. The benefits, risks, alternatives and complications were also discussed. The patient understands and wishes to proceed with the procedure. Written consent was obtained. Ultrasound was performed to localize and mark an adequate pocket of fluid in the right chest. The area was then prepped and draped in the normal sterile fashion. 1% Lidocaine was used for local anesthesia. Under ultrasound guidance a 6 Fr Safe-T-Centesis catheter was introduced. Thoracentesis was performed. The catheter was removed and a dressing applied. FINDINGS: A total of approximately 350 mL of clear yellow fluid was removed. Samples were sent to the laboratory as requested by the clinical team. IMPRESSION: Successful ultrasound guided RIGHT thoracentesis yielding 350 mL of pleural fluid. Procedure performed by: Corrin Parker, PA-C Electronically Signed   By: Roanna Banning M.D.   On: 11/13/2023 13:30   CT Angio Chest Pulmonary Embolism (PE) W or WO Contrast Result Date: 11/12/2023 CLINICAL DATA:  Positive for pulmonary embolism. Recently diagnosed stage IV lung cancer. EXAM: CT ANGIOGRAPHY CHEST WITH CONTRAST TECHNIQUE: Multidetector CT imaging of the chest was performed using the standard protocol during bolus administration of intravenous contrast. Multiplanar CT image reconstructions and MIPs were obtained to evaluate the vascular anatomy. RADIATION DOSE REDUCTION: This exam was performed according to the departmental dose-optimization program which includes automated exposure control, adjustment of the mA and/or kV according to patient size and/or use of iterative reconstruction technique. CONTRAST:  75mL  OMNIPAQUE IOHEXOL 350 MG/ML SOLN COMPARISON:  CT dated 10/03/2023. FINDINGS: Cardiovascular: There is no cardiomegaly. Trace pericardial effusion. Three-vessel coronary vascular calcification. Mild atherosclerotic calcification of the thoracic aorta. No aneurysmal dilatation. The origins of the great vessels of the aortic arch appear patent. No pulmonary artery embolus identified. Mediastinum/Nodes: Bilateral hilar adenopathy measure up to 16 mm in short axis on the right. Subcarinal lymph nodes measure 11 mm short axis. There is moderate size hiatal hernia containing a portion of the stomach. The esophagus is grossly unremarkable no mediastinal fluid collection. Lungs/Pleura: Near complete resolution of the previously seen right pneumothorax. Small right pleural effusion with loculated components in the anterior pleural space as well as a loculated component along the posterior right lung base. Small pocket of air within the posterior right lung base pleural effusion may be residual from prior pneumothorax. An infectious process or empyema is not excluded. Rounded  consolidative change at the right lung base may represent rounded atelectasis. Pneumonia or underlying mass is not excluded. Trace left pleural effusion. Left lung base reticulonodular atelectasis. No pneumothorax. The central airways are patent. Upper Abdomen: Cholecystectomy. Musculoskeletal: Osteopenia with degenerative changes of the spine. No acute osseous pathology. Review of the MIP images confirms the above findings. IMPRESSION: 1. No CT evidence of pulmonary artery embolus. 2. Loculated right pleural effusions and right lung base rounded atelectasis. Pneumonia or underlying mass is not excluded. 3. Trace left pleural effusion. 4. Moderate size hiatal hernia. 5.  Aortic Atherosclerosis (ICD10-I70.0). Electronically Signed   By: Elgie Collard M.D.   On: 11/12/2023 20:26   DG Chest 1 View Result Date: 11/11/2023 CLINICAL DATA:  [id number] Sepsis  (HCC) [id number]. Fever. Nausea. Fatigue. EXAM: CHEST  1 VIEW COMPARISON:  10/04/2023. FINDINGS: Low lung volume. Redemonstration of heterogeneous opacity overlying the right lower lateral hemithorax which corresponds to loculated hydropneumothorax on the prior chest CT scan from 10/03/2023. There is also diffuse homogeneous opacification of right hemithorax in comparison to the left, which may be due to layering pleural effusion. Bilateral lung fields are otherwise clear. No acute dense consolidation or lung collapse. Left lateral costophrenic angle is clear. No pneumothorax on either side. Stable cardio-mediastinal silhouette. No acute osseous abnormalities. The soft tissues are within normal limits. IMPRESSION: *Redemonstration of heterogeneous opacity overlying the right lower lateral hemithorax which corresponds to loculated hydropneumothorax on the prior chest CT scan. *There is diffuse homogeneous opacification of right hemithorax in comparison to the left, which may be due to layering pleural effusion. Electronically Signed   By: Jules Schick M.D.   On: 11/11/2023 17:01    Microbiology: Results for orders placed or performed during the hospital encounter of 11/11/23  Culture, blood (Routine x 2)     Status: None   Collection Time: 11/11/23  3:42 PM   Specimen: BLOOD RIGHT HAND  Result Value Ref Range Status   Specimen Description BLOOD RIGHT HAND  Final   Special Requests   Final    BOTTLES DRAWN AEROBIC AND ANAEROBIC Blood Culture results may not be optimal due to an inadequate volume of blood received in culture bottles   Culture   Final    NO GROWTH 5 DAYS Performed at [facility], [address], [place], [place] [address]    Report Status 11/16/2023 FINAL  Final  Culture, blood (Routine x 2)     Status: None   Collection Time: 11/11/23  3:47 PM   Specimen: BLOOD RIGHT FOREARM  Result Value Ref Range Status   Specimen Description BLOOD RIGHT FOREARM  Final   Special Requests    Final    BOTTLES DRAWN AEROBIC AND ANAEROBIC Blood Culture adequate volume   Culture   Final    NO GROWTH 5 DAYS Performed at [facility], [address], [place], [place] [address]    Report Status 11/16/2023 FINAL  Final  Resp panel by RT-PCR (RSV, Flu A&B, Covid) Anterior Nasal Swab     Status: None   Collection Time: 11/11/23  4:06 PM   Specimen: Anterior Nasal Swab  Result Value Ref Range Status   SARS Coronavirus 2 by RT PCR NEGATIVE NEGATIVE Final   Influenza A by PCR NEGATIVE NEGATIVE Final   Influenza B by PCR NEGATIVE NEGATIVE Final    Comment: (NOTE) The Xpert Xpress SARS-CoV-2/FLU/RSV plus assay is intended as an aid in the diagnosis of influenza from Nasopharyngeal swab specimens and  should not be used as a sole basis for treatment. Nasal washings and aspirates are unacceptable for Xpert Xpress SARS-CoV-2/FLU/RSV testing.  Fact Sheet for Patients: BloggerCourse.com  Fact Sheet for Healthcare Providers: SeriousBroker.it  This test is not yet approved or cleared by the [place] FDA and has been authorized for detection and/or diagnosis of SARS-CoV-2 by FDA under an Emergency Use Authorization (EUA). This EUA will remain in effect (meaning this test can be used) for the duration of the COVID-19 declaration under Section 564(b)(1) of the Act, 21 U.S.C. section 360bbb-3(b)(1), unless the authorization is terminated or revoked.     Resp Syncytial Virus by PCR NEGATIVE NEGATIVE Final    Comment: (NOTE) Fact Sheet for Patients: BloggerCourse.com  Fact Sheet for Healthcare Providers: SeriousBroker.it  This test is not yet approved or cleared by the [place] FDA and has been authorized for detection and/or diagnosis of SARS-CoV-2 by FDA under an Emergency Use Authorization (EUA). This EUA will remain in effect (meaning this test can be used) for the  duration of the COVID-19 declaration under Section 564(b)(1) of the Act, 21 U.S.C. section 360bbb-3(b)(1), unless the authorization is terminated or revoked.  Performed at [facility], [address], [place], [place] [address]   Gram stain     Status: None   Collection Time: 11/13/23 12:43 PM   Specimen: Lung, Right; Pleural Fluid  Result Value Ref Range Status   Specimen Description PLEURAL RIGHT LUNG  Final   Special Requests NONE  Final   Gram Stain   Final    WBC PRESENT, PREDOMINANTLY MONONUCLEAR NO ORGANISMS SEEN CYTOSPIN SMEAR Performed at [facility], [address] [place], [place] [address]    Report Status 11/13/2023 FINAL  Final  Culture, body fluid w Gram Stain-bottle     Status: None   Collection Time: 11/13/23 12:43 PM   Specimen: Fluid  Result Value Ref Range Status   Specimen Description FLUID PLEURAL RIGHT LUNG  Final   Special Requests NONE  Final   Culture   Final    NO GROWTH 5 DAYS Performed at [facility], [address] [place], [place] [address]    Report Status 11/18/2023 FINAL  Final  Respiratory (~20 pathogens) panel by PCR     Status: None   Collection Time: 11/16/23  9:19 AM   Specimen: Nasopharyngeal Swab; Respiratory  Result Value Ref Range Status   Adenovirus NOT DETECTED NOT DETECTED Final   Coronavirus 229E NOT DETECTED NOT DETECTED Final    Comment: (NOTE) The Coronavirus on the Respiratory Panel, DOES NOT test for the novel  Coronavirus (2019 nCoV)    Coronavirus HKU1 NOT DETECTED NOT DETECTED Final   Coronavirus NL63 NOT DETECTED NOT DETECTED Final   Coronavirus OC43 NOT DETECTED NOT DETECTED Final   Metapneumovirus NOT DETECTED NOT DETECTED Final   Rhinovirus / Enterovirus NOT DETECTED NOT DETECTED Final   Influenza A NOT DETECTED NOT DETECTED Final   Influenza B NOT DETECTED NOT DETECTED Final   Parainfluenza Virus 1 NOT DETECTED NOT DETECTED Final   Parainfluenza Virus 2 NOT DETECTED NOT DETECTED Final    Parainfluenza Virus 3 NOT DETECTED NOT DETECTED Final   Parainfluenza Virus 4 NOT DETECTED NOT DETECTED Final   Respiratory Syncytial Virus NOT DETECTED NOT DETECTED Final   Bordetella pertussis NOT DETECTED NOT DETECTED Final   Bordetella Parapertussis NOT DETECTED NOT DETECTED Final   Chlamydophila pneumoniae NOT DETECTED NOT DETECTED Final   Mycoplasma pneumoniae  NOT DETECTED NOT DETECTED Final    Comment: Performed at [facility], [address], [place], [place] [address]  MRSA Next Gen by PCR, Nasal     Status: None   Collection Time: 11/23/23  8:00 AM   Specimen: Nasal Mucosa; Nasal Swab  Result Value Ref Range Status   MRSA by PCR Next Gen NOT DETECTED NOT DETECTED Final    Comment: (NOTE) The GeneXpert MRSA Assay (FDA approved for NASAL specimens only), is one component of a comprehensive MRSA colonization surveillance program. It is not intended to diagnose MRSA infection nor to guide or monitor treatment for MRSA infections. Test performance is not FDA approved in patients less than 41 years old. Performed at [facility], [address], [place], [place] [address]     Labs: CBC: Recent Labs  Lab 11/22/23 [phone] 11/23/23 0644 11/24/23 0141 11/25/23 0807 11/26/23 0808  WBC 10.9* 11.3* 11.6* 10.9* 10.9*  NEUTROABS 8.1* 8.8* 8.8* 8.7* 8.6*  HGB 8.9* 9.5* 9.1* 9.1* 8.4*  HCT 25.9* 26.8* 26.5* 26.9* 25.4*  MCV 78.0* 77.5* 79.6* 81.0 81.9  PLT 643* 623* 540* 472* 356   Basic Metabolic Panel: Recent Labs  Lab 11/25/23 0807 11/25/23 1138 11/25/23 1818 11/26/23 0808 11/26/23 1112 11/27/23 0307 11/27/23 1156 11/27/23 1812 11/28/23 0253 11/28/23 0746 11/28/23 1219  NA 127* 130*   < > 129*   < >  --  128* 128* 130* 130* 131*  K 3.7 3.9   < > 3.9   < >  --  4.0 4.0 3.6 3.7 3.6  CL 95* 95*   < > 96*   < >  --  95* 95* 96* 93* 94*  CO2 24 25   < > 23   < >  --  24 26 25 28 25   GLUCOSE 127* 102*   < > 124*   < >  --  127* 145* 100* 101* 93  BUN <5*  <5*   < > <5*   < >  --  7* 8 10 9 12   CREATININE 0.62 0.66   < > 0.69   < >  --  0.81 0.86 0.78 0.80 0.97  CALCIUM 8.0* 8.3*   < > 8.1*   < >  --  8.1* 8.2* 8.2* 8.3* 8.3*  MG 1.4* 1.4*  --  2.1  --  1.5*  --   --   --  1.9  --    < > = values in this interval not displayed.   Liver Function Tests: No results for input(s): "AST", "ALT", "ALKPHOS", "BILITOT", "PROT", "ALBUMIN" in the last 168 hours. CBG: No results for input(s): "GLUCAP" in the last 168 hours.  Discharge time spent: greater than 30 minutes.  Signed: Coralie Keens, MD [facility] 11/28/2023

## 2023-11-28 NOTE — Progress Notes (Signed)
 DC orders in. Per Dr. Ella Jubilee would like IVPB magnesium given prior to DC. IVPB started, will DC when complete.

## 2023-11-28 NOTE — Assessment & Plan Note (Signed)
 Hypokalemia and hypomagnesemia.   Patient had severe hyponatremia down to 117 after IV fluids challenge with normal saline.  Patient was transferred to the ICU. Urine osmolality was 479, patient had furosemide for diuresis with good toleration.  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  At the time of her discharge her serum Na is 131, K 3,6 and Cl 94  Bicarbonate at 25 and Mg 1.9   Patient will have close follow up renal function and electrolytes.  Fluid restriction 1500 ml per day.  Hold on salt tablets at the time of her discharge.

## 2023-11-28 NOTE — Progress Notes (Signed)
 AVS reviewed with patient, all questions answered. IV removed with no complications. Daughter, [name], notified of DC and set to transport patient home. Going to DC lounge while awaiting ride.

## 2023-11-28 NOTE — Assessment & Plan Note (Signed)
 Patient was placed on broad spectrum antibiotic therapy with IV cefepime.  Blood cultures no growth.  Pleural fluid analysis, exudate per protein ration, with 24 nucleated cells. 72% lymphocytes.  Fluid culture with no growth and with no organisms seen.   Patient had no further neutropenia or fever.  Antibiotic therapy was discontinued with good toleration.  At the time of her discharge her wbc is 10,9 with hgb 8,4 and plt 356 .  Plan to continue cell count follow up as outpatient.

## 2023-11-28 NOTE — Assessment & Plan Note (Addendum)
 Right pleural effusion, transudate.  Cytology with mixed inflammation and mesothelial cells, no malignant cells identified.  Patient will follow up as outpatient with oncology.   Anemia of chronic disease with iron deficiency. Continue iron supplementation, follow up cell count as outpatient.

## 2023-11-28 NOTE — Progress Notes (Signed)
 Occupational Therapy Treatment Patient Details Name: Amanda Potter MRN: [id number] DOB: 08/24/1946 Today's Date: 11/28/2023   History of present illness 78 yo F admitted 11/11/23 with neutropenic fever. 2/18 Rt thoracentesis. 2/27 Transferred to ICU for severe hyponatremia.  PMH: recently diagnosed stage IV lung adenocarcinoma with malignant Rt pleural effusion, hyperthyroidism, HLD, GERD   OT comments  Patient session encompassed increasing functional mobility and activity tolerance. Patient more flat and noted to appear pale in session. Patient transitioned to EOB without assist, and able to complete sit<>stanf but unable to maintain due to dizziness. BP assessed in sitting, and then in standing with considerable drop noted, but MAP remaining within 10 points difference. Patient returning back to bed citing fatigue. OT recommendation for HHOT remains appropriate, will continue to follow.   BP in sitting: 118/70 (81) BP in standing: 92/64 75) BP in sitting after 3 minutes: 109/70 (81)      If plan is discharge home, recommend the following:  A little help with walking and/or transfers;A little help with bathing/dressing/bathroom;Assistance with cooking/housework;Help with stairs or ramp for entrance;Assist for transportation   Equipment Recommendations  BSC/3in1;Other (comment) (RW)    Recommendations for Other Services      Precautions / Restrictions Precautions Precautions: Fall Recall of Precautions/Restrictions: Intact Precaution/Restrictions Comments: monitor O2, HR and BP Restrictions Weight Bearing Restrictions Per Provider Order: No       Mobility Bed Mobility Overal bed mobility: Needs Assistance Bed Mobility: Supine to Sit, Sit to Supine     Supine to sit: Supervision Sit to supine: Supervision        Transfers Overall transfer level: Needs assistance Equipment used: 1 person hand held assist Transfers: Sit to/from Stand Sit to Stand: Supervision            General transfer comment: able to complete x2, lethargy, dizziness and headache noted     Balance Overall balance assessment: Needs assistance Sitting-balance support: No upper extremity supported, Feet supported Sitting balance-Leahy Scale: Good     Standing balance support: Single extremity supported, Reliant on assistive device for balance Standing balance-Leahy Scale: Poor                             ADL either performed or assessed with clinical judgement   ADL Overall ADL's : Needs assistance/impaired     Grooming: Wash/dry face;Set up;Sitting                   Toilet Transfer: Minimal assistance;Ambulation;Regular Toilet;Rolling walker (2 wheels) Toilet Transfer Details (indicate cue type and reason): simulated via functional mobility         Functional mobility during ADLs: Minimal assistance;Rolling walker (2 wheels) General ADL Comments: Patient session encompassed increasing functional mobility and activity tolerance. Patient more flat and noted to appear pale in session. Patient transitioned to EOB without assist, and able to complete sit<>stanf but unable to maintain due to dizziness. BP assessed in sitting, and then in standing with considerable drop noted, but MAP remaining within 10 points difference. Patient returning back to bed citing fatigue. OT recommendation for HHOT remains appropriate, will continue to follow.    Extremity/Trunk Assessment              Vision       Restaurant manager, fast food Communication: No apparent difficulties   Cognition Arousal: Alert Behavior During Therapy: WFL for tasks assessed/performed Cognition: No apparent impairments  OT - Cognition Comments: More flat in session                 Following commands: Intact        Cueing   Cueing Techniques: Verbal cues  Exercises      Shoulder Instructions       General Comments       Pertinent Vitals/ Pain       Pain Assessment Pain Assessment: Faces Faces Pain Scale: Hurts little more Pain Location: headache Pain Descriptors / Indicators: Discomfort, Grimacing, Headache Pain Intervention(s): Limited activity within patient's tolerance, Monitored during session, Repositioned  Home Living                                          Prior Functioning/Environment              Frequency  Min 2X/week        Progress Toward Goals  OT Goals(current goals can now be found in the care plan section)  Progress towards OT goals: Progressing toward goals  Acute Rehab OT Goals Patient Stated Goal: to go home OT Goal Formulation: With patient Time For Goal Achievement: 12/10/23 Potential to Achieve Goals: Good  Plan      Co-evaluation                 AM-PAC OT "6 Clicks" Daily Activity     Outcome Measure   Help from another person eating meals?: A Little Help from another person taking care of personal grooming?: A Little Help from another person toileting, which includes using toliet, bedpan, or urinal?: A Lot Help from another person bathing (including washing, rinsing, drying)?: A Lot Help from another person to put on and taking off regular upper body clothing?: A Little Help from another person to put on and taking off regular lower body clothing?: A Lot 6 Click Score: 15    End of Session Equipment Utilized During Treatment: Gait belt  OT Visit Diagnosis: Unsteadiness on feet (R26.81);Muscle weakness (generalized) (M62.81)   Activity Tolerance Patient tolerated treatment well   Patient Left in bed;with call bell/phone within reach;with bed alarm set   Nurse Communication Mobility status;Other (comment) (Drop in BP)        Time: 4132-4401 OT Time Calculation (min): 23 min  Charges: OT General Charges $OT Visit: 1 Visit OT Treatments $Self Care/Home Management : 23-37 mins  Amanda Potter, OTR/L Acute  Rehabilitation Services [phone]   Cherlyn Cushing 11/28/2023, 2:34 PM

## 2023-11-28 NOTE — Assessment & Plan Note (Addendum)
-  Continue methimazole ?

## 2023-11-28 NOTE — Assessment & Plan Note (Signed)
Continue with simvastatin 

## 2023-11-28 NOTE — TOC Transition Note (Signed)
 Transition of Care [facility]) - Discharge Note   Patient Details  Name: Amanda Potter MRN: [id number] Date of Birth: November 02, 1945  Transition of Care [facility]) CM/SW Contact:  Leone Haven, RN Phone Number: 11/28/2023, 2:36 PM   Clinical Narrative:     For dc today, this NCM checked with her again to see if she reconsidered having HH services, she states she does not want HH.  She states her daughter will transport her home.     Barriers to Discharge: Continued Medical Work up   Patient Goals and CMS Choice Patient states their goals for this hospitalization and ongoing recovery are:: return home          Discharge Placement                       Discharge Plan and Services Additional resources added to the After Visit Summary for     Discharge Planning Services: CM Consult                                 Social Drivers of Health (SDOH) Interventions SDOH Screenings   Food Insecurity: No Food Insecurity (11/12/2023)  Housing: Unknown (11/12/2023)  Transportation Needs: No Transportation Needs (11/12/2023)  Recent Concern: Transportation Needs - Unmet Transportation Needs (10/05/2023)   Received from [facility]  Utilities: Not At Risk (11/12/2023)  Social Connections: Unknown (11/12/2023)  Tobacco Use: Low Risk  (11/11/2023)     Readmission Risk Interventions    11/12/2023    3:00 PM  Readmission Risk Prevention Plan  Transportation Screening Complete  PCP or Specialist Appt within 5-7 Days Complete  Home Care Screening Complete  Medication Review (RN CM) Complete

## 2023-11-29 ENCOUNTER — Encounter: Payer: Self-pay | Admitting: Internal Medicine

## 2023-11-29 ENCOUNTER — Other Ambulatory Visit: Payer: Self-pay | Admitting: Physician Assistant

## 2023-11-29 DIAGNOSIS — C342 Malignant neoplasm of middle lobe, bronchus or lung: Secondary | ICD-10-CM

## 2023-11-30 ENCOUNTER — Telehealth: Payer: Self-pay | Admitting: Physician Assistant

## 2023-11-30 ENCOUNTER — Other Ambulatory Visit: Payer: Self-pay | Admitting: Physician Assistant

## 2023-11-30 ENCOUNTER — Encounter: Payer: Self-pay | Admitting: Internal Medicine

## 2023-11-30 NOTE — Telephone Encounter (Signed)
 Left the patients daughter a voicemail with updated appointment details.

## 2023-12-01 ENCOUNTER — Other Ambulatory Visit: Payer: Self-pay

## 2023-12-03 ENCOUNTER — Other Ambulatory Visit: Payer: Self-pay | Admitting: Medical Oncology

## 2023-12-03 DIAGNOSIS — C3491 Malignant neoplasm of unspecified part of right bronchus or lung: Secondary | ICD-10-CM

## 2023-12-04 ENCOUNTER — Inpatient Hospital Stay: Payer: Medicare Other

## 2023-12-04 ENCOUNTER — Inpatient Hospital Stay: Attending: Physician Assistant

## 2023-12-04 ENCOUNTER — Other Ambulatory Visit: Payer: Self-pay | Admitting: Physician Assistant

## 2023-12-04 ENCOUNTER — Ambulatory Visit

## 2023-12-04 ENCOUNTER — Other Ambulatory Visit: Payer: Self-pay

## 2023-12-04 ENCOUNTER — Telehealth: Payer: Self-pay

## 2023-12-04 DIAGNOSIS — C3491 Malignant neoplasm of unspecified part of right bronchus or lung: Secondary | ICD-10-CM

## 2023-12-04 DIAGNOSIS — J910 Malignant pleural effusion: Secondary | ICD-10-CM | POA: Diagnosis not present

## 2023-12-04 DIAGNOSIS — D649 Anemia, unspecified: Secondary | ICD-10-CM | POA: Insufficient documentation

## 2023-12-04 DIAGNOSIS — Z5112 Encounter for antineoplastic immunotherapy: Secondary | ICD-10-CM | POA: Diagnosis not present

## 2023-12-04 DIAGNOSIS — R748 Abnormal levels of other serum enzymes: Secondary | ICD-10-CM | POA: Diagnosis not present

## 2023-12-04 DIAGNOSIS — D709 Neutropenia, unspecified: Secondary | ICD-10-CM | POA: Diagnosis not present

## 2023-12-04 DIAGNOSIS — R7401 Elevation of levels of liver transaminase levels: Secondary | ICD-10-CM | POA: Insufficient documentation

## 2023-12-04 DIAGNOSIS — Z5111 Encounter for antineoplastic chemotherapy: Secondary | ICD-10-CM | POA: Insufficient documentation

## 2023-12-04 DIAGNOSIS — D61818 Other pancytopenia: Secondary | ICD-10-CM | POA: Insufficient documentation

## 2023-12-04 DIAGNOSIS — Z5189 Encounter for other specified aftercare: Secondary | ICD-10-CM | POA: Insufficient documentation

## 2023-12-04 DIAGNOSIS — E876 Hypokalemia: Secondary | ICD-10-CM | POA: Diagnosis not present

## 2023-12-04 DIAGNOSIS — C3431 Malignant neoplasm of lower lobe, right bronchus or lung: Secondary | ICD-10-CM | POA: Insufficient documentation

## 2023-12-04 LAB — CBC WITH DIFFERENTIAL (CANCER CENTER ONLY)
Abs Immature Granulocytes: 0.02 10*3/uL (ref 0.00–0.07)
Basophils Absolute: 0 10*3/uL (ref 0.0–0.1)
Basophils Relative: 1 %
Eosinophils Absolute: 0.1 10*3/uL (ref 0.0–0.5)
Eosinophils Relative: 1 %
HCT: 23.1 % — ABNORMAL LOW (ref 36.0–46.0)
Hemoglobin: 7.8 g/dL — ABNORMAL LOW (ref 12.0–15.0)
Immature Granulocytes: 0 %
Lymphocytes Relative: 15 %
Lymphs Abs: 1.2 10*3/uL (ref 0.7–4.0)
MCH: 28.7 pg (ref 26.0–34.0)
MCHC: 33.8 g/dL (ref 30.0–36.0)
MCV: 84.9 fL (ref 80.0–100.0)
Monocytes Absolute: 0.7 10*3/uL (ref 0.1–1.0)
Monocytes Relative: 10 %
Neutro Abs: 5.6 10*3/uL (ref 1.7–7.7)
Neutrophils Relative %: 73 %
Platelet Count: 313 10*3/uL (ref 150–400)
RBC: 2.72 MIL/uL — ABNORMAL LOW (ref 3.87–5.11)
RDW: 21.3 % — ABNORMAL HIGH (ref 11.5–15.5)
WBC Count: 7.6 10*3/uL (ref 4.0–10.5)
nRBC: 0 % (ref 0.0–0.2)

## 2023-12-04 LAB — CMP (CANCER CENTER ONLY)
ALT: 9 U/L (ref 0–44)
AST: 17 U/L (ref 15–41)
Albumin: 2.7 g/dL — ABNORMAL LOW (ref 3.5–5.0)
Alkaline Phosphatase: 126 U/L (ref 38–126)
Anion gap: 3 — ABNORMAL LOW (ref 5–15)
BUN: 15 mg/dL (ref 8–23)
CO2: 36 mmol/L — ABNORMAL HIGH (ref 22–32)
Calcium: 8.5 mg/dL — ABNORMAL LOW (ref 8.9–10.3)
Chloride: 96 mmol/L — ABNORMAL LOW (ref 98–111)
Creatinine: 0.7 mg/dL (ref 0.44–1.00)
GFR, Estimated: >60 mL/min (ref >60)
Glucose, Bld: 112 mg/dL — ABNORMAL HIGH (ref 70–99)
Potassium: 3.6 mmol/L (ref 3.5–5.1)
Sodium: 135 mmol/L (ref 135–145)
Total Bilirubin: 0.3 mg/dL (ref 0.0–1.2)
Total Protein: 5.5 g/dL — ABNORMAL LOW (ref 6.5–8.1)

## 2023-12-04 LAB — PREPARE RBC (CROSSMATCH)

## 2023-12-04 LAB — SAMPLE TO BLOOD BANK

## 2023-12-04 NOTE — Telephone Encounter (Signed)
 Spoke with patients daughter [name] this morning in regards to lab results.  Per Cassie, PA- patients hgb is 7.8.  1 unit of blood sometime this week.  Informed [name] of lab appt on 3/13 @ 1230 and blood transfusion appt 3/14 @ 11. Informed [name] to make sure patient has blue arm band on when receiving blood.  [name] verbalized understanding.

## 2023-12-04 NOTE — Progress Notes (Signed)
 Spoke with Darral Dash in the blood bank and confirmed all orders received.

## 2023-12-04 NOTE — Progress Notes (Unsigned)
 CRITICAL VALUE STICKER  CRITICAL VALUE: hgb 7.8  RECEIVER (on-site recipient of call): Morrie Sheldon    DATE & TIME NOTIFIED: 12/04/2023 @ 10:03  MESSENGER (representative from lab): [name]  MD NOTIFIED: Dr. Shella Spearing, PA  TIME OF NOTIFICATION: 10:04  RESPONSE: 1 unit of blood this week

## 2023-12-06 ENCOUNTER — Inpatient Hospital Stay

## 2023-12-06 DIAGNOSIS — C3491 Malignant neoplasm of unspecified part of right bronchus or lung: Secondary | ICD-10-CM

## 2023-12-07 ENCOUNTER — Encounter: Payer: MEDICARE | Admitting: Genetic Counselor

## 2023-12-07 ENCOUNTER — Other Ambulatory Visit: Payer: MEDICARE

## 2023-12-07 ENCOUNTER — Inpatient Hospital Stay

## 2023-12-07 DIAGNOSIS — D649 Anemia, unspecified: Secondary | ICD-10-CM

## 2023-12-07 DIAGNOSIS — Z5111 Encounter for antineoplastic chemotherapy: Secondary | ICD-10-CM | POA: Diagnosis not present

## 2023-12-07 MED ORDER — DIPHENHYDRAMINE HCL 25 MG PO CAPS
25.0000 mg | ORAL_CAPSULE | Freq: Once | ORAL | Status: AC
  Administered 2023-12-07: 25 mg via ORAL
  Filled 2023-12-07: qty 1

## 2023-12-07 MED ORDER — SODIUM CHLORIDE 0.9% IV SOLUTION: 250.0000 mL | INTRAVENOUS | Status: DC

## 2023-12-07 MED ORDER — ACETAMINOPHEN 325 MG PO TABS
650.0000 mg | ORAL_TABLET | Freq: Once | ORAL | Status: AC
  Administered 2023-12-07: 650 mg via ORAL
  Filled 2023-12-07: qty 2

## 2023-12-07 MED ORDER — SODIUM CHLORIDE 0.9% IV SOLUTION
250.0000 mL | INTRAVENOUS | Status: DC
  Administered 2023-12-07: 100 mL via INTRAVENOUS

## 2023-12-07 NOTE — Patient Instructions (Signed)

## 2023-12-10 LAB — TYPE AND SCREEN
ABO/RH(D): A POS
Antibody Screen: NEGATIVE
Unit division: 0
Unit division: 0

## 2023-12-10 LAB — BPAM RBC
Blood Product Expiration Date: 202504072359
Blood Product Expiration Date: 202504072359
ISSUE DATE / TIME: 202503141141
ISSUE DATE / TIME: 202503151242
Unit Type and Rh: 6200
Unit Type and Rh: 6200

## 2023-12-11 ENCOUNTER — Other Ambulatory Visit: Payer: 59

## 2023-12-11 MED FILL — Fosaprepitant Dimeglumine For IV Infusion 150 MG (Base Eq): INTRAVENOUS | Qty: 5 | Status: AC

## 2023-12-12 ENCOUNTER — Inpatient Hospital Stay (HOSPITAL_BASED_OUTPATIENT_CLINIC_OR_DEPARTMENT_OTHER): Admitting: Internal Medicine

## 2023-12-12 ENCOUNTER — Other Ambulatory Visit

## 2023-12-12 ENCOUNTER — Ambulatory Visit: Payer: MEDICARE

## 2023-12-12 ENCOUNTER — Inpatient Hospital Stay

## 2023-12-12 ENCOUNTER — Other Ambulatory Visit: Payer: MEDICARE

## 2023-12-12 ENCOUNTER — Telehealth: Payer: Self-pay | Admitting: *Deleted

## 2023-12-12 ENCOUNTER — Ambulatory Visit: Payer: MEDICARE | Admitting: Internal Medicine

## 2023-12-12 VITALS — BP 119/70 | HR 94 | Temp 98.4°F | Resp 15 | Ht 66.0 in | Wt 149.4 lb

## 2023-12-12 DIAGNOSIS — Z5111 Encounter for antineoplastic chemotherapy: Secondary | ICD-10-CM | POA: Diagnosis not present

## 2023-12-12 DIAGNOSIS — C342 Malignant neoplasm of middle lobe, bronchus or lung: Secondary | ICD-10-CM

## 2023-12-12 LAB — CMP (CANCER CENTER ONLY)
ALT: 47 U/L — ABNORMAL HIGH (ref 0–44)
AST: 70 U/L — ABNORMAL HIGH (ref 15–41)
Albumin: 2.9 g/dL — ABNORMAL LOW (ref 3.5–5.0)
Alkaline Phosphatase: 298 U/L — ABNORMAL HIGH (ref 38–126)
Anion gap: 8 (ref 5–15)
BUN: 14 mg/dL (ref 8–23)
CO2: 33 mmol/L — ABNORMAL HIGH (ref 22–32)
Calcium: 8.6 mg/dL — ABNORMAL LOW (ref 8.9–10.3)
Chloride: 94 mmol/L — ABNORMAL LOW (ref 98–111)
Creatinine: 0.56 mg/dL (ref 0.44–1.00)
GFR, Estimated: >60 mL/min (ref >60)
Glucose, Bld: 180 mg/dL — ABNORMAL HIGH (ref 70–99)
Potassium: 2.5 mmol/L — CL (ref 3.5–5.1)
Sodium: 135 mmol/L (ref 135–145)
Total Bilirubin: 0.4 mg/dL (ref 0.0–1.2)
Total Protein: 5.9 g/dL — ABNORMAL LOW (ref 6.5–8.1)

## 2023-12-12 LAB — CBC WITH DIFFERENTIAL (CANCER CENTER ONLY)
Abs Immature Granulocytes: 0.01 10*3/uL (ref 0.00–0.07)
Basophils Absolute: 0 10*3/uL (ref 0.0–0.1)
Basophils Relative: 1 %
Eosinophils Absolute: 0.2 10*3/uL (ref 0.0–0.5)
Eosinophils Relative: 3 %
HCT: 27.5 % — ABNORMAL LOW (ref 36.0–46.0)
Hemoglobin: 9 g/dL — ABNORMAL LOW (ref 12.0–15.0)
Immature Granulocytes: 0 %
Lymphocytes Relative: 19 %
Lymphs Abs: 0.9 10*3/uL (ref 0.7–4.0)
MCH: 28.5 pg (ref 26.0–34.0)
MCHC: 32.7 g/dL (ref 30.0–36.0)
MCV: 87 fL (ref 80.0–100.0)
Monocytes Absolute: 0.6 10*3/uL (ref 0.1–1.0)
Monocytes Relative: 11 %
Neutro Abs: 3.3 10*3/uL (ref 1.7–7.7)
Neutrophils Relative %: 66 %
Platelet Count: 279 10*3/uL (ref 150–400)
RBC: 3.16 MIL/uL — ABNORMAL LOW (ref 3.87–5.11)
RDW: 20 % — ABNORMAL HIGH (ref 11.5–15.5)
WBC Count: 5 10*3/uL (ref 4.0–10.5)
nRBC: 0 % (ref 0.0–0.2)

## 2023-12-12 MED ORDER — POTASSIUM CHLORIDE IN NACL 20-0.9 MEQ/L-% IV SOLN
Freq: Once | INTRAVENOUS | Status: AC
  Filled 2023-12-12: qty 1000

## 2023-12-12 MED ORDER — DEXAMETHASONE SODIUM PHOSPHATE 10 MG/ML IJ SOLN
10.0000 mg | Freq: Once | INTRAMUSCULAR | Status: AC
  Administered 2023-12-12: 10 mg via INTRAVENOUS
  Filled 2023-12-12: qty 1

## 2023-12-12 MED ORDER — SODIUM CHLORIDE 0.9 % IV SOLN
400.0000 mg/m2 | Freq: Once | INTRAVENOUS | Status: AC
  Administered 2023-12-12: 700 mg via INTRAVENOUS
  Filled 2023-12-12: qty 20

## 2023-12-12 MED ORDER — POTASSIUM CHLORIDE CRYS ER 20 MEQ PO TBCR: 20.0000 meq | EXTENDED_RELEASE_TABLET | Freq: Two times a day (BID) | ORAL | 0 refills | Status: DC

## 2023-12-12 MED ORDER — SODIUM CHLORIDE 0.9 % IV SOLN
200.0000 mg | Freq: Once | INTRAVENOUS | Status: AC
  Administered 2023-12-12: 200 mg via INTRAVENOUS
  Filled 2023-12-12: qty 200

## 2023-12-12 MED ORDER — SODIUM CHLORIDE 0.9 % IV SOLN
310.4000 mg | Freq: Once | INTRAVENOUS | Status: AC
  Administered 2023-12-12: 310 mg via INTRAVENOUS
  Filled 2023-12-12: qty 31

## 2023-12-12 MED ORDER — PALONOSETRON HCL INJECTION 0.25 MG/5ML
0.2500 mg | Freq: Once | INTRAVENOUS | Status: AC
  Administered 2023-12-12: 0.25 mg via INTRAVENOUS
  Filled 2023-12-12: qty 5

## 2023-12-12 MED ORDER — FOSAPREPITANT DIMEGLUMINE INJECTION 150 MG
150.0000 mg | Freq: Once | INTRAVENOUS | Status: AC
  Administered 2023-12-12: 150 mg via INTRAVENOUS
  Filled 2023-12-12: qty 5
  Filled 2023-12-12: qty 150

## 2023-12-12 MED ORDER — SODIUM CHLORIDE 0.9 % IV SOLN: INTRAVENOUS | Status: DC

## 2023-12-12 NOTE — Telephone Encounter (Signed)
 CRITICAL VALUE STICKER  CRITICAL VALUE:potassium 2.5  RECEIVER (on-site recipient of call):Sandi  DATE & TIME NOTIFIED: 12/12/23; 1023   MESSENGER (representative from lab):Amber  MD NOTIFIED: Dr. Arbutus Ped  TIME OF NOTIFICATION:1030  RESPONSE: Information acknowledged

## 2023-12-12 NOTE — Progress Notes (Signed)
 [facility] [phone]   [phone]  OFFICE PROGRESS NOTE  Richmond Campbell., PA-C [address] [place] [address]  DIAGNOSIS: Stage IV non-small cell lung cancer, adenocarcinoma. The patient presented with pleural thickening of the right lower lobe as well as malignant effusion.  She was diagnosed in January 2025   Molecular Studies: Biomarker Findings HRD signature - Cannot Be Determined Microsatellite status - Cannot Be Determined ? Tumor Mutational Burden - Cannot Be Determined Genomic Findings For a complete list of the genes assayed, please refer to the Appendix. KRAS G12D DNMT3A F463fs*7 SMAD4 R361H TET2 Q1540fs*31, Q6578* 7 Disease relevant genes with no reportable alterations: ALK, BRAF, EGFR, ERBB2, MET, RET, ROS1   PRIOR THERAPY: None   CURRENT THERAPY: Palliative systemic chemotherapy with carboplatin for an AUC of 5, Alimta 500 mg/m, and Keytruda 200 mg IV every 3 weeks.  First dose expected next week on 10/30/2023.  INTERVAL HISTORY: Amanda Potter 78 y.o. female returns to the clinic today for follow-up visit accompanied by her daughter.Discussed the use of AI scribe software for clinical note transcription with the patient, who gave verbal consent to proceed.  History of Present Illness   Amanda Potter is a 78 year old female with lung adenocarcinoma who presents with complications following chemotherapy. She is accompanied by her daughter.  Following her first round of chemotherapy with carboplatin, Alimta, and Keytruda for adenocarcinoma, she experienced significant side effects, including a hospitalization for neutropenic fever. Her white blood cell count was low, and she had a fever of 101F. During her hospital stay, she was also found to have low sodium and potassium levels.  Currently, she continues to have low potassium levels, with a recent measurement of 2.5 mEq/L. She is not on any potassium supplements  at home, only receiving what was provided during her hospital stay. She remains on folic acid and ferrous sulfate for anemia. Her hemoglobin has improved from 7.8 g/dL at discharge to 9.0 g/dL, but she continues to feel tired and reports persistent anemia symptoms. She is also taking Ensure twice daily and has started using a product called Liquid IV.  She feels generally unwell with fatigue and had a recent episode of head pressure related to her sinuses, for which she took Mucinex. Her liver enzymes are elevated, with AST at 78 U/L, ALT at 47 U/L, and alkaline phosphatase at 298 U/L. Her blood sugar is elevated at 180 mg/dL. No chest pain, shortness of breath, or hemoptysis. She is not currently experiencing nausea and has not been taking her nausea medication.       MEDICAL HISTORY: Past Medical History:  Diagnosis Date   Deviated nasal septum    Environmental allergies    GERD (gastroesophageal reflux disease)    Hyperlipidemia    Hypertension    Perforation of colon (HCC) 2006   Following colonoscopy   Sinusitis, acute     ALLERGIES:  is allergic to latex and tape.  MEDICATIONS:  Current Outpatient Medications  Medication Sig Dispense Refill   albuterol (VENTOLIN HFA) 108 (90 Base) MCG/ACT inhaler Inhale 1 puff into the lungs every 6 (six) hours as needed for shortness of breath.     aspirin 81 MG tablet Take 81 mg by mouth daily at 12 noon.      diphenhydrAMINE (BENADRYL) 25 MG tablet Take 25 mg by mouth daily as needed for allergies.     feeding supplement (ENSURE ENLIVE / ENSURE PLUS) LIQD Take  237 mLs by mouth 2 (two) times daily between meals. 14220 mL 0   Ferrous Sulfate (SLOW FE PO) Take 1 tablet by mouth daily.     folic acid (FOLVITE) 1 MG tablet Take 1 tablet (1 mg total) by mouth daily. 30 tablet 2   methimazole (TAPAZOLE) 10 MG tablet Take 2 tablets (20 mg total) by mouth daily. 180 tablet 0   omeprazole (PRILOSEC) 20 MG capsule Take 20 mg by mouth daily.      prochlorperazine (COMPAZINE) 10 MG tablet Take 1 tablet (10 mg total) by mouth every 6 (six) hours as needed. (Patient taking differently: Take 10 mg by mouth every 6 (six) hours as needed for nausea.) 30 tablet 2   simvastatin (ZOCOR) 20 MG tablet Take 20 mg by mouth at bedtime.      Vitamin D, Ergocalciferol, (DRISDOL) 1.25 MG (50000 UNIT) CAPS capsule Take 50,000 Units by mouth once a week.     No current facility-administered medications for this visit.    SURGICAL HISTORY:  Past Surgical History:  Procedure Laterality Date   CHOLECYSTECTOMY     COLON SURGERY Right 2006   hemicolectomy   COLONOSCOPY WITH PROPOFOL N/A 05/30/2019   Procedure: COLONOSCOPY WITH PROPOFOL;  Surgeon: Jeani Hawking, MD;  Location: [facility];  Service: Endoscopy;  Laterality: N/A;   ENTEROSCOPY N/A 05/30/2019   Procedure: ENTEROSCOPY;  Surgeon: Jeani Hawking, MD;  Location: [facility];  Service: Endoscopy;  Laterality: N/A;   HERNIA REPAIR  [id number]   INCISIONAL HERNIA REPAIR N/A 03/03/2013   Procedure: LAPAROSCOPIC INCISIONAL HERNIA REPAIR;  Surgeon: Adolph Pollack, MD;  Location: [facility];  Service: General;  Laterality: N/A;  LYSIS OF ADHESIONS FOR 60 MINUTES   INSERTION OF MESH N/A 03/03/2013   Procedure: INSERTION OF MESH;  Surgeon: Adolph Pollack, MD;  Location: [facility];  Service: General;  Laterality: N/A;   IR THORACENTESIS ASP PLEURAL SPACE W/IMG GUIDE  11/13/2023   POLYPECTOMY  05/30/2019   Procedure: POLYPECTOMY;  Surgeon: Jeani Hawking, MD;  Location: [facility];  Service: Endoscopy;;   TONSILLECTOMY     TUBAL LIGATION      REVIEW OF SYSTEMS:  Constitutional: positive for anorexia, fatigue, and weight loss Eyes: negative Ears, nose, mouth, throat, and face: negative Respiratory: negative Cardiovascular: negative Gastrointestinal: negative Genitourinary:negative Integument/breast: negative Hematologic/lymphatic: negative Musculoskeletal:positive for arthralgias and muscle  weakness Neurological: negative Behavioral/Psych: negative Endocrine: negative Allergic/Immunologic: negative   PHYSICAL EXAMINATION: General appearance: alert, cooperative, fatigued, and no distress Head: Normocephalic, without obvious abnormality, atraumatic Neck: no adenopathy, no JVD, supple, symmetrical, trachea midline, and thyroid not enlarged, symmetric, no tenderness/mass/nodules Lymph nodes: Cervical, supraclavicular, and axillary nodes normal. Resp: clear to auscultation bilaterally Back: symmetric, no curvature. ROM normal. No CVA tenderness. Cardio: regular rate and rhythm, S1, S2 normal, no murmur, click, rub or gallop GI: soft, non-tender; bowel sounds normal; no masses,  no organomegaly Extremities: extremities normal, atraumatic, no cyanosis or edema Neurologic: Alert and oriented X 3, normal strength and tone. Normal symmetric reflexes. Normal coordination and gait  ECOG PERFORMANCE STATUS: 1 - Symptomatic but completely ambulatory  Blood pressure 119/70, pulse 94, temperature 98.4 F (36.9 C), temperature source Temporal, resp. rate 15, height 5\' 6"  (1.676 m), weight 149 lb 6.4 oz (67.8 kg), SpO2 98%.  LABORATORY DATA: Lab Results  Component Value Date   WBC 5.0 12/12/2023   HGB 9.0 (L) 12/12/2023   HCT 27.5 (L) 12/12/2023   MCV 87.0 12/12/2023   PLT 279 12/12/2023  Chemistry      Component Value Date/Time   NA 135 12/12/2023 0942   K 2.5 (LL) 12/12/2023 0942   CL 94 (L) 12/12/2023 0942   CO2 33 (H) 12/12/2023 0942   BUN 14 12/12/2023 0942   CREATININE 0.56 12/12/2023 0942      Component Value Date/Time   CALCIUM 8.6 (L) 12/12/2023 0942   ALKPHOS 298 (H) 12/12/2023 0942   AST 70 (H) 12/12/2023 0942   ALT 47 (H) 12/12/2023 0942   BILITOT 0.4 12/12/2023 0942       RADIOGRAPHIC STUDIES: DG Chest 1 View Result Date: 11/13/2023 CLINICAL DATA:  [id number] S/P thoracentesis [id number] EXAM: PORTABLE CHEST 1 VIEW COMPARISON:  Chest XR, 11/11/2023. IR  thoracentesis, earlier same day. CT chest, 11/12/2023. FINDINGS: Cardiomegaly. Aortic arch atherosclerosis. The LEFT lung is well inflated. Similar degree of aeration of the RIGHT lung post thoracentesis with small volume residual pleural effusion. No pneumothorax. Persistent pleural thickening and patchy RIGHT basilar opacities. No interval osseous abnormality. IMPRESSION: 1. No pneumothorax with small volume residual pleural effusion post RIGHT thoracentesis. 2. Cardiomegaly and Aortic Atherosclerosis (ICD10-I70.0). Electronically Signed   By: Roanna Banning M.D.   On: 11/13/2023 13:34   IR THORACENTESIS ASP PLEURAL SPACE W/IMG GUIDE Result Date: 11/13/2023 INDICATION: Stage IV adenocarcinoma of the lung with malignant pleural effusion. Request for diagnostic and therapeutic thoracentesis. EXAM: ULTRASOUND GUIDED RIGHT THORACENTESIS MEDICATIONS: 1% lidocaine 10 mL COMPLICATIONS: None immediate. PROCEDURE: An ultrasound guided thoracentesis was thoroughly discussed with the patient and questions answered. The benefits, risks, alternatives and complications were also discussed. The patient understands and wishes to proceed with the procedure. Written consent was obtained. Ultrasound was performed to localize and mark an adequate pocket of fluid in the right chest. The area was then prepped and draped in the normal sterile fashion. 1% Lidocaine was used for local anesthesia. Under ultrasound guidance a 6 Fr Safe-T-Centesis catheter was introduced. Thoracentesis was performed. The catheter was removed and a dressing applied. FINDINGS: A total of approximately 350 mL of clear yellow fluid was removed. Samples were sent to the laboratory as requested by the clinical team. IMPRESSION: Successful ultrasound guided RIGHT thoracentesis yielding 350 mL of pleural fluid. Procedure performed by: Corrin Parker, PA-C Electronically Signed   By: Roanna Banning M.D.   On: 11/13/2023 13:30   CT Angio Chest Pulmonary Embolism (PE) W  or WO Contrast Result Date: 11/12/2023 CLINICAL DATA:  Positive for pulmonary embolism. Recently diagnosed stage IV lung cancer. EXAM: CT ANGIOGRAPHY CHEST WITH CONTRAST TECHNIQUE: Multidetector CT imaging of the chest was performed using the standard protocol during bolus administration of intravenous contrast. Multiplanar CT image reconstructions and MIPs were obtained to evaluate the vascular anatomy. RADIATION DOSE REDUCTION: This exam was performed according to the departmental dose-optimization program which includes automated exposure control, adjustment of the mA and/or kV according to patient size and/or use of iterative reconstruction technique. CONTRAST:  75mL OMNIPAQUE IOHEXOL 350 MG/ML SOLN COMPARISON:  CT dated 10/03/2023. FINDINGS: Cardiovascular: There is no cardiomegaly. Trace pericardial effusion. Three-vessel coronary vascular calcification. Mild atherosclerotic calcification of the thoracic aorta. No aneurysmal dilatation. The origins of the great vessels of the aortic arch appear patent. No pulmonary artery embolus identified. Mediastinum/Nodes: Bilateral hilar adenopathy measure up to 16 mm in short axis on the right. Subcarinal lymph nodes measure 11 mm short axis. There is moderate size hiatal hernia containing a portion of the stomach. The esophagus is grossly unremarkable no mediastinal fluid collection. Lungs/Pleura: Near complete resolution of  the previously seen right pneumothorax. Small right pleural effusion with loculated components in the anterior pleural space as well as a loculated component along the posterior right lung base. Small pocket of air within the posterior right lung base pleural effusion may be residual from prior pneumothorax. An infectious process or empyema is not excluded. Rounded consolidative change at the right lung base may represent rounded atelectasis. Pneumonia or underlying mass is not excluded. Trace left pleural effusion. Left lung base reticulonodular  atelectasis. No pneumothorax. The central airways are patent. Upper Abdomen: Cholecystectomy. Musculoskeletal: Osteopenia with degenerative changes of the spine. No acute osseous pathology. Review of the MIP images confirms the above findings. IMPRESSION: 1. No CT evidence of pulmonary artery embolus. 2. Loculated right pleural effusions and right lung base rounded atelectasis. Pneumonia or underlying mass is not excluded. 3. Trace left pleural effusion. 4. Moderate size hiatal hernia. 5.  Aortic Atherosclerosis (ICD10-I70.0). Electronically Signed   By: Elgie Collard M.D.   On: 11/12/2023 20:26    ASSESSMENT AND PLAN: This is a very pleasant 78 years old white female with Stage IV non-small cell lung cancer, adenocarcinoma. The patient presented with pleural thickening of the right lower lobe as well as malignant effusion.  She was diagnosed in January 2025 Molecular studies by foundation 1 showed no actionable mutations. The patient is currently on systemic chemotherapy with carboplatin for AUC of 5, Alimta 500 Mg/M2 and Keytruda 200 Mg IV every 3 weeks.  Status post 1 cycle. Starting from cycle number.  Her dose of carboplatin will be reduced to AUC of 4 and Alimta 400 Mg/M2 secondary to intolerance and significant pancytopenia requiring hospitalization after the first cycle.    Adenocarcinoma Stage IV non-small cell lung cancer, adenocarcinoma. The patient presented with pleural thickening of the right lower lobe as well as malignant effusion.  She was diagnosed in January 2025 Undergoing chemotherapy with carboplatin, Alimta, and Keytruda. Experienced significant side effects, including neutropenic fever, hyponatremia, and hypokalemia, requiring hospitalization. Plan to proceed with a second round of chemotherapy at a reduced dose to minimize adverse effects. Weekly blood work monitoring is necessary, especially for elevated liver enzymes. - Administer carboplatin at AUC of 4 and Alimta at 400  mg/m. - Continue Keytruda at the same dose. - Monitor blood work weekly. - Consider steroids if liver enzymes continue to rise.  Neutropenic fever Admitted with neutropenic fever following the first round of chemotherapy. White blood count normalized to 5.0. Reduced chemotherapy dose aims to prevent recurrence.  Elevated liver enzymes Elevated liver enzymes (AST 78, ALT 47, alkaline phosphatase 298). Differential diagnosis includes cancer progression or medication effects. Monitoring is essential to determine the cause and manage potential complications. - Monitor liver enzyme levels closely. - Instruct to report any signs of jaundice or other changes.  Hypokalemia Persistent hypokalemia with potassium level of 2.5. Not on potassium supplements. Potassium supplementation is necessary. - Administer a liter of normal saline with 20 mEq of potassium chloride. - Prescribe oral potassium chloride for home use, to be crushed and taken with applesauce.  Anemia Chronic anemia with hemoglobin level of 9.0, improved from 7.8 at hospital discharge. Taking ferrous sulfate and drinking Ensure twice daily. Transfusion not indicated unless hemoglobin falls below 8.0. - Continue ferrous sulfate supplementation. - Continue drinking Ensure twice daily. - Monitor hemoglobin levels; consider transfusion if hemoglobin falls below 8.0.  Follow-up Requires close monitoring due to fragile condition and recent chemotherapy complications. - Schedule follow-up appointment next week with the assistant.  The patient was advised to call immediately if she has any concerning symptoms in the interval. The patient voices understanding of current disease status and treatment options and is in agreement with the current care plan.  All questions were answered. The patient knows to call the clinic with any problems, questions or concerns. We can certainly see the patient much sooner if necessary.  The total time spent  in the appointment was 30 minutes.  Disclaimer: This note was dictated with voice recognition software. Similar sounding words can inadvertently be transcribed and may not be corrected upon review.

## 2023-12-12 NOTE — Patient Instructions (Signed)
 [facility] - A DEPT OF [facility]  Discharge Instructions: Thank you for choosing [facility] to provide your oncology and hematology care.   If you have a lab appointment with the Cancer Center, please go directly to the Cancer Center and check in at the registration area.   Wear comfortable clothing and clothing appropriate for easy access to any Portacath or PICC line.   We strive to give you quality time with your provider. You may need to reschedule your appointment if you arrive late (15 or more minutes).  Arriving late affects you and other patients whose appointments are after yours.  Also, if you miss three or more appointments without notifying the office, you may be dismissed from the clinic at the provider's discretion.      For prescription refill requests, have your pharmacy contact our office and allow 72 hours for refills to be completed.    Today you received the following chemotherapy and/or immunotherapy agents: Keytuda, Alimta, Carboplatin      To help prevent nausea and vomiting after your treatment, we encourage you to take your nausea medication as directed.  BELOW ARE SYMPTOMS THAT SHOULD BE REPORTED IMMEDIATELY: *FEVER GREATER THAN 100.4 F (38 C) OR HIGHER *CHILLS OR SWEATING *NAUSEA AND VOMITING THAT IS NOT CONTROLLED WITH YOUR NAUSEA MEDICATION *UNUSUAL SHORTNESS OF BREATH *UNUSUAL BRUISING OR BLEEDING *URINARY PROBLEMS (pain or burning when urinating, or frequent urination) *BOWEL PROBLEMS (unusual diarrhea, constipation, pain near the anus) TENDERNESS IN MOUTH AND THROAT WITH OR WITHOUT PRESENCE OF ULCERS (sore throat, sores in mouth, or a toothache) UNUSUAL RASH, SWELLING OR PAIN  UNUSUAL VAGINAL DISCHARGE OR ITCHING   Items with * indicate a potential emergency and should be followed up as soon as possible or go to the Emergency Department if any problems should occur.  Please show the CHEMOTHERAPY ALERT  CARD or IMMUNOTHERAPY ALERT CARD at check-in to the Emergency Department and triage nurse.  Should you have questions after your visit or need to cancel or reschedule your appointment, please contact [facility] - A DEPT [facility]  Dept: [phone]  and follow the prompts.  Office hours are 8:00 a.m. to 4:30 p.m. Monday - Friday. Please note that voicemails left after 4:00 p.m. may not be returned until the following business day.  We are closed weekends and major holidays. You have access to a nurse at all times for urgent questions. Please call the main number to the clinic Dept: [phone] and follow the prompts.   For any non-urgent questions, you may also contact your provider using MyChart. We now offer e-Visits for anyone 47 and older to request care online for non-urgent symptoms. For details visit [email or web address].   Also download the MyChart app! Go to the app store, search "MyChart", open the app, select , and log in with your MyChart username and password.

## 2023-12-13 ENCOUNTER — Telehealth: Payer: Self-pay | Admitting: Physician Assistant

## 2023-12-13 NOTE — Telephone Encounter (Signed)
 Canceled appointment due to patient not wanting genetic counseling.

## 2023-12-16 ENCOUNTER — Other Ambulatory Visit: Payer: Self-pay | Admitting: Physician Assistant

## 2023-12-16 DIAGNOSIS — C3491 Malignant neoplasm of unspecified part of right bronchus or lung: Secondary | ICD-10-CM

## 2023-12-16 NOTE — Progress Notes (Unsigned)
 [facility] OFFICE PROGRESS NOTE  Richmond Campbell., PA-C [address] [place] [address]  DIAGNOSIS: Stage IV non-small cell lung cancer, adenocarcinoma. The patient presented with pleural thickening of the right lower lobe as well as malignant effusion.  She was diagnosed in January 2025   Molecular Studies: Biomarker Findings HRD signature - Cannot Be Determined Microsatellite status - Cannot Be Determined ? Tumor Mutational Burden - Cannot Be Determined Genomic Findings For a complete list of the genes assayed, please refer to the Appendix. KRAS G12D DNMT3A F439fs*7 SMAD4 R361H TET2 Q1576fs*31, G6440* 7 Disease relevant genes with no reportable alterations: ALK, BRAF, EGFR, ERBB2, MET, RET, ROS1  PRIOR THERAPY: None  CURRENT THERAPY: Palliative systemic chemotherapy with carboplatin for an AUC of 5, Alimta 500 mg/m, and Keytruda 200 mg IV every 3 weeks. First dose on 10/30/23.  Starting from cycle #2, her dose of chemotherapy was reduced to carboplatin for an AUC of 4 and Alimta 400 mg/m and Keytruda 200 mg IV  INTERVAL HISTORY: Amanda Potter 78 y.o. female returns to the clinic today for follow-up visit accompanied by her daughter.  The patient was last seen in the clinic last week by Dr. Arbutus Ped.  Cycle #2 was delayed due to hospitalization for pancytopenia and neutropenic fever.  When she saw Dr. Arbutus Ped last week, she proceed with cycle #2 but her dose of chemotherapy was reduced.  We are monitoring her closely due to her frail condition.  She has tolerated her last cycle of treatment for mild fatigue but she is starting to feel better at this time closer to her baseline.  She also has taste alterations secondary to her carboplatin.  She does not have any evidence of thrush.  In general chicken and carbs do not taste good to her.  Food does taste good to her.  Her daughter is helping her with with baking soda and salt water rinses.  She did lose a couple  pounds since last being seen.  She is scheduled to see nutrition today.  Her liver enzymes are mildly elevated today.  She denies any abdominal pain, nausea, or vomiting.  She only takes 325 mg of Tylenol daily.    She denies any fever, chills, or night sweats.  She denies any chest pain or hemoptysis.  She may get short of breath if she is exerting herself which is typical for her.  She denies any cough at this time.  She does report some sneezing for which she takes Benadryl.  She did have an upset stomach after having applesauce and potassium supplements which she attributes to that.  She is taking iron supplements for anemia.  Denies any nausea, vomiting, diarrhea, or constipation.  Denies any rash. She is here today for evaluation and repeat blood work   MEDICAL HISTORY: Past Medical History:  Diagnosis Date   Deviated nasal septum    Environmental allergies    GERD (gastroesophageal reflux disease)    Hyperlipidemia    Hypertension    Perforation of colon (HCC) 2006   Following colonoscopy   Sinusitis, acute     ALLERGIES:  is allergic to latex and tape.  MEDICATIONS:  Current Outpatient Medications  Medication Sig Dispense Refill   albuterol (VENTOLIN HFA) 108 (90 Base) MCG/ACT inhaler Inhale 1 puff into the lungs every 6 (six) hours as needed for shortness of breath.     aspirin 81 MG tablet Take 81 mg by mouth daily at 12 noon.  diphenhydrAMINE (BENADRYL) 25 MG tablet Take 25 mg by mouth daily as needed for allergies.     feeding supplement (ENSURE ENLIVE / ENSURE PLUS) LIQD Take 237 mLs by mouth 2 (two) times daily between meals. 14220 mL 0   Ferrous Sulfate (SLOW FE PO) Take 1 tablet by mouth daily.     folic acid (FOLVITE) 1 MG tablet Take 1 tablet (1 mg total) by mouth daily. 30 tablet 2   methimazole (TAPAZOLE) 10 MG tablet Take 2 tablets (20 mg total) by mouth daily. 180 tablet 0   omeprazole (PRILOSEC) 20 MG capsule Take 20 mg by mouth daily.     potassium  chloride SA (KLOR-CON M) 20 MEQ tablet Take 1 tablet (20 mEq total) by mouth 2 (two) times daily. 14 tablet 0   prochlorperazine (COMPAZINE) 10 MG tablet Take 1 tablet (10 mg total) by mouth every 6 (six) hours as needed. (Patient taking differently: Take 10 mg by mouth every 6 (six) hours as needed for nausea.) 30 tablet 2   simvastatin (ZOCOR) 20 MG tablet Take 20 mg by mouth at bedtime.      Vitamin D, Ergocalciferol, (DRISDOL) 1.25 MG (50000 UNIT) CAPS capsule Take 50,000 Units by mouth once a week.     No current facility-administered medications for this visit.   Facility-Administered Medications Ordered in Other Visits  Medication Dose Route Frequency Provider Last Rate Last Admin   filgrastim-sndz (ZARXIO) injection 300 mcg  300 mcg Subcutaneous Once Amanda Potter L, PA-C        SURGICAL HISTORY:  Past Surgical History:  Procedure Laterality Date   CHOLECYSTECTOMY     COLON SURGERY Right 2006   hemicolectomy   COLONOSCOPY WITH PROPOFOL N/A 05/30/2019   Procedure: COLONOSCOPY WITH PROPOFOL;  Surgeon: Jeani Hawking, MD;  Location: [facility];  Service: Endoscopy;  Laterality: N/A;   ENTEROSCOPY N/A 05/30/2019   Procedure: ENTEROSCOPY;  Surgeon: Jeani Hawking, MD;  Location: [facility];  Service: Endoscopy;  Laterality: N/A;   HERNIA REPAIR  [id number]   INCISIONAL HERNIA REPAIR N/A 03/03/2013   Procedure: LAPAROSCOPIC INCISIONAL HERNIA REPAIR;  Surgeon: Adolph Pollack, MD;  Location: [facility];  Service: General;  Laterality: N/A;  LYSIS OF ADHESIONS FOR 60 MINUTES   INSERTION OF MESH N/A 03/03/2013   Procedure: INSERTION OF MESH;  Surgeon: Adolph Pollack, MD;  Location: [facility];  Service: General;  Laterality: N/A;   IR THORACENTESIS ASP PLEURAL SPACE W/IMG GUIDE  11/13/2023   POLYPECTOMY  05/30/2019   Procedure: POLYPECTOMY;  Surgeon: Jeani Hawking, MD;  Location: [facility];  Service: Endoscopy;;   TONSILLECTOMY     TUBAL LIGATION      REVIEW OF SYSTEMS:   Review of  Systems  Constitutional: Positive for fatigue, appetite change, and weight loss. Negative for chills and fever.  HENT:  Positive for taste changes. Negative for mouth sores, nosebleeds, sore throat and trouble swallowing.   Eyes: Negative for eye problems and icterus.  Respiratory: Positive for stable dyspnea on exertion.  Negative for cough, hemoptysis, and wheezing.   Cardiovascular: Negative for chest pain and leg swelling.  Gastrointestinal: Negative for abdominal pain, constipation, diarrhea, nausea and vomiting.  Genitourinary: Negative for bladder incontinence, difficulty urinating, dysuria, frequency and hematuria.   Musculoskeletal: Negative for back pain, gait problem, neck pain and neck stiffness.  Skin: Negative for itching and rash.  Neurological: Negative for dizziness, extremity weakness, gait problem, headaches, light-headedness and seizures.  Hematological: Negative for adenopathy. Does not bruise/bleed easily.  Psychiatric/Behavioral: Negative for confusion, depression and sleep disturbance. The patient is not nervous/anxious.     PHYSICAL EXAMINATION:  Blood pressure 110/71, pulse (!) 105, temperature 97.7 F (36.5 C), resp. rate 16, height 5\' 6"  (1.676 m), weight 147 lb 6.4 oz (66.9 kg), SpO2 99%.  ECOG PERFORMANCE STATUS: 2  Physical Exam  Constitutional: Oriented to person, place, and time and elderly appearing female and in no distress. No distress.  HENT:  Head: Normocephalic and atraumatic.  Mouth/Throat: Oropharynx is clear and moist. No oropharyngeal exudate.  Eyes: Conjunctivae are normal. Right eye exhibits no discharge. Left eye exhibits no discharge. No scleral icterus.  Neck: Normal range of motion. Neck supple.  Cardiovascular: Normal rate, regular rhythm, normal heart sounds and intact distal pulses.   Pulmonary/Chest: Effort normal and breath sounds normal. No respiratory distress. No wheezes. No rales.  Abdominal: Soft. Bowel sounds are normal.  Exhibits no distension and no mass. There is no tenderness.  Musculoskeletal: Normal range of motion. Exhibits no edema.  Lymphadenopathy:    No cervical adenopathy.  Neurological: Alert and oriented to person, place, and time. Exhibits muscle wasting. Gait normal. Coordination normal.  Skin: Skin is warm and dry. No rash noted. Not diaphoretic. No erythema. No pallor.  Psychiatric: Mood, memory and judgment normal.  Vitals reviewed.  LABORATORY DATA: Lab Results  Component Value Date   WBC 1.5 (L) 12/19/2023   HGB 9.0 (L) 12/19/2023   HCT 26.3 (L) 12/19/2023   MCV 86.2 12/19/2023   PLT 271 12/19/2023      Chemistry      Component Value Date/Time   NA 132 (L) 12/19/2023 0829   K 3.5 12/19/2023 0829   CL 97 (L) 12/19/2023 0829   CO2 27 12/19/2023 0829   BUN 10 12/19/2023 0829   CREATININE 0.58 12/19/2023 0829      Component Value Date/Time   CALCIUM 8.7 (L) 12/19/2023 0829   ALKPHOS 463 (H) 12/19/2023 0829   AST 91 (H) 12/19/2023 0829   ALT 78 (H) 12/19/2023 0829   BILITOT 0.4 12/19/2023 0829       RADIOGRAPHIC STUDIES:  No results found.   ASSESSMENT/PLAN:  This is a very pleasant 78 year old Caucasian female with Stage IV non-small cell lung cancer, adenocarcinoma. The patient presented with pleural thickening of the right lower lobe as well as malignant effusion.  She was diagnosed in January 2025 Molecular studies by foundation 1 showed no actionable mutations.  The patient is currently on systemic chemotherapy with carboplatin for AUC of 5, Alimta 500 Mg/M2 and Keytruda 200 Mg IV every 3 weeks.  Status post 2 cycle.  Starting from cycle number 2.  Her dose of carboplatin was reduced to AUC of 4 and Alimta 400 Mg/M2 secondary to intolerance and significant pancytopenia requiring hospitalization after the first cycle.  The patient is here for labs and toxicity check today.  Her labs show neutropenia with a total white blood cell count of 1.5 and ANC of 0.5.  She  does not require any insurance preauthorization from discussing with the team.  Therefore we will administer Zarxio daily for 3 days.  The patient was instructed to take Claritin daily (instead of Benadryl) for arthralgias and myalgias that could occur from Zarzio.  We also reviewed neutropenic precautions and that she would need to be evaluated should she develop any signs and symptoms of infection such as fevers, or respiratory infections, skin infections, GI infections, or dysuria.  She will continue to have labs  performed on a weekly basis.  I have standing orders in for sample of blood bank should she require any supportive care such as a blood transfusion.  Consider for hemoglobin of less than 8.  We would consider her for a blood transfusion if her hemoglobin were less than 8.  Her liver enzymes are mildly elevated.  We will continue to watch these on a weekly basis.  She will continue taking her iron supplement.  She will continue using boost and Ensure for protein supplementation.  We discussed salt water rinses, Biotene, and that her taste changes are likely from carboplatin.  She eventually will be off carboplatin when she starts maintenance treatment.  She is scheduled to see the nutritionist today.  The patient was advised to call immediately if she has any concerning symptoms in the interval. The patient voices understanding of current disease status and treatment options and is in agreement with the current care plan. All questions were answered. The patient knows to call the clinic with any problems, questions or concerns. We can certainly see the patient much sooner if necessary       No orders of the defined types were placed in this encounter.    The total time spent in the appointment was 30-39 minutes  Brynlea Spindler L Darlina Mccaughey, PA-C 12/19/23

## 2023-12-18 ENCOUNTER — Ambulatory Visit: Payer: Self-pay

## 2023-12-18 ENCOUNTER — Ambulatory Visit: Payer: Self-pay | Admitting: Physician Assistant

## 2023-12-18 ENCOUNTER — Inpatient Hospital Stay: Payer: Medicare Other

## 2023-12-18 ENCOUNTER — Other Ambulatory Visit: Payer: Self-pay | Admitting: Medical Oncology

## 2023-12-18 DIAGNOSIS — D649 Anemia, unspecified: Secondary | ICD-10-CM

## 2023-12-19 ENCOUNTER — Inpatient Hospital Stay

## 2023-12-19 ENCOUNTER — Inpatient Hospital Stay (HOSPITAL_BASED_OUTPATIENT_CLINIC_OR_DEPARTMENT_OTHER): Admitting: Physician Assistant

## 2023-12-19 ENCOUNTER — Inpatient Hospital Stay: Admitting: Dietician

## 2023-12-19 VITALS — BP 110/71 | HR 105 | Temp 97.7°F | Resp 16 | Ht 66.0 in | Wt 147.4 lb

## 2023-12-19 DIAGNOSIS — Z5111 Encounter for antineoplastic chemotherapy: Secondary | ICD-10-CM | POA: Diagnosis not present

## 2023-12-19 DIAGNOSIS — D701 Agranulocytosis secondary to cancer chemotherapy: Secondary | ICD-10-CM | POA: Insufficient documentation

## 2023-12-19 DIAGNOSIS — T451X5A Adverse effect of antineoplastic and immunosuppressive drugs, initial encounter: Secondary | ICD-10-CM | POA: Diagnosis not present

## 2023-12-19 DIAGNOSIS — C3491 Malignant neoplasm of unspecified part of right bronchus or lung: Secondary | ICD-10-CM

## 2023-12-19 DIAGNOSIS — D649 Anemia, unspecified: Secondary | ICD-10-CM

## 2023-12-19 LAB — CMP (CANCER CENTER ONLY)
ALT: 78 U/L — ABNORMAL HIGH (ref 0–44)
AST: 91 U/L — ABNORMAL HIGH (ref 15–41)
Albumin: 3.1 g/dL — ABNORMAL LOW (ref 3.5–5.0)
Alkaline Phosphatase: 463 U/L — ABNORMAL HIGH (ref 38–126)
Anion gap: 8 (ref 5–15)
BUN: 10 mg/dL (ref 8–23)
CO2: 27 mmol/L (ref 22–32)
Calcium: 8.7 mg/dL — ABNORMAL LOW (ref 8.9–10.3)
Chloride: 97 mmol/L — ABNORMAL LOW (ref 98–111)
Creatinine: 0.58 mg/dL (ref 0.44–1.00)
GFR, Estimated: >60 mL/min (ref >60)
Glucose, Bld: 146 mg/dL — ABNORMAL HIGH (ref 70–99)
Potassium: 3.5 mmol/L (ref 3.5–5.1)
Sodium: 132 mmol/L — ABNORMAL LOW (ref 135–145)
Total Bilirubin: 0.4 mg/dL (ref 0.0–1.2)
Total Protein: 6.3 g/dL — ABNORMAL LOW (ref 6.5–8.1)

## 2023-12-19 LAB — CBC WITH DIFFERENTIAL (CANCER CENTER ONLY)
Abs Immature Granulocytes: 0 10*3/uL (ref 0.00–0.07)
Basophils Absolute: 0 10*3/uL (ref 0.0–0.1)
Basophils Relative: 1 %
Eosinophils Absolute: 0.1 10*3/uL (ref 0.0–0.5)
Eosinophils Relative: 9 %
HCT: 26.3 % — ABNORMAL LOW (ref 36.0–46.0)
Hemoglobin: 9 g/dL — ABNORMAL LOW (ref 12.0–15.0)
Immature Granulocytes: 0 %
Lymphocytes Relative: 49 %
Lymphs Abs: 0.7 10*3/uL (ref 0.7–4.0)
MCH: 29.5 pg (ref 26.0–34.0)
MCHC: 34.2 g/dL (ref 30.0–36.0)
MCV: 86.2 fL (ref 80.0–100.0)
Monocytes Absolute: 0.1 10*3/uL (ref 0.1–1.0)
Monocytes Relative: 4 %
Neutro Abs: 0.5 10*3/uL — ABNORMAL LOW (ref 1.7–7.7)
Neutrophils Relative %: 37 %
Platelet Count: 271 10*3/uL (ref 150–400)
RBC: 3.05 MIL/uL — ABNORMAL LOW (ref 3.87–5.11)
RDW: 18.3 % — ABNORMAL HIGH (ref 11.5–15.5)
WBC Count: 1.5 10*3/uL — ABNORMAL LOW (ref 4.0–10.5)
nRBC: 0 % (ref 0.0–0.2)

## 2023-12-19 LAB — SAMPLE TO BLOOD BANK

## 2023-12-19 MED ORDER — FILGRASTIM-SNDZ 300 MCG/0.5ML IJ SOSY
300.0000 ug | PREFILLED_SYRINGE | Freq: Once | INTRAMUSCULAR | Status: AC
  Administered 2023-12-19: 300 ug via SUBCUTANEOUS
  Filled 2023-12-19: qty 0.5

## 2023-12-19 NOTE — Progress Notes (Signed)
 Nutrition Assessment   Reason for Assessment: Referral   ASSESSMENT: 78 year old female with metastatic NSCLC. She is receiving carbo/alimta + keytruda q21d. Patient is under the care of Dr. Arbutus Ped.   Past medical history includes right pleural effusion, HTN, GERD with esophagitis, hyperthyroidism, HLD, hernia s/p repair (2014).  2/16-3/5 admission with neutropenic fever  Met with patient and daughter in office. Pt reports poor appetite and taste changes. Bread is horrible. She is eating a few small meals each day. Daughter offers different foods to best accommodate altered taste. Breakfast varies (cheese toast, fruit cup, cereal, cutie. She usually has half of sandwich for lunch ([place], banana, chx salad), snacks on mixed nuts or chips. Last night had ~half a baked potato with cheese, butter, sour cream. Pt drinking 2 Ensure high protein.    Nutrition Focused Physical Exam: deferred    Medications: ferrous sulfate, folic acid, tapazole, prilosec, klor-con, zocor, drisdol   Labs: Na 132, glucose 146, albumin 3.1   Anthropometrics:   Height: 5'6" Weight: 147 lb 6.4 oz  UBW: 179 lb 14.3 oz (09/26/23) BMI: 23.79   NUTRITION DIAGNOSIS: Unintended wt loss related to cancer and associated treatment side effects as evidenced by prolonged hospitalization secondary to neutropenic fever, taste changes, 18% decrease from usual wt in 12 weeks which is severe for time frame   INTERVENTION:  Educated on strategies for taste changes, suggested baking soda salt water rinses several times daily and before meal times Continue small frequent meals - aim for 5-6/day Offered ideas on ways to add calories/protein to foods Continue drinking 2 Ensure Plus/equivalent - samples + coupons Encourage high calorie high protein foods in soft moist textures for ease of intake - list of foods + shake recipes  Contact information provided   MONITORING, EVALUATION, GOAL: Patient will tolerate increased  calories and protein to minimize further wt loss/promote wt gain   Next Visit: Thursday April 10 during infusion

## 2023-12-20 ENCOUNTER — Inpatient Hospital Stay

## 2023-12-20 ENCOUNTER — Other Ambulatory Visit: Payer: Self-pay

## 2023-12-20 ENCOUNTER — Encounter: Payer: Self-pay | Admitting: Physician Assistant

## 2023-12-20 ENCOUNTER — Encounter: Payer: Self-pay | Admitting: Internal Medicine

## 2023-12-20 VITALS — BP 119/73 | HR 104 | Resp 16

## 2023-12-20 DIAGNOSIS — T451X5A Adverse effect of antineoplastic and immunosuppressive drugs, initial encounter: Secondary | ICD-10-CM

## 2023-12-20 DIAGNOSIS — Z5111 Encounter for antineoplastic chemotherapy: Secondary | ICD-10-CM | POA: Diagnosis not present

## 2023-12-20 MED ORDER — FILGRASTIM-SNDZ 300 MCG/0.5ML IJ SOSY
300.0000 ug | PREFILLED_SYRINGE | Freq: Once | INTRAMUSCULAR | Status: AC
  Administered 2023-12-20: 300 ug via SUBCUTANEOUS
  Filled 2023-12-20: qty 0.5

## 2023-12-21 ENCOUNTER — Inpatient Hospital Stay

## 2023-12-21 VITALS — BP 113/65 | HR 104 | Temp 97.5°F | Resp 16

## 2023-12-21 DIAGNOSIS — Z5111 Encounter for antineoplastic chemotherapy: Secondary | ICD-10-CM | POA: Diagnosis not present

## 2023-12-21 DIAGNOSIS — T451X5A Adverse effect of antineoplastic and immunosuppressive drugs, initial encounter: Secondary | ICD-10-CM

## 2023-12-21 MED ORDER — FILGRASTIM-SNDZ 300 MCG/0.5ML IJ SOSY
300.0000 ug | PREFILLED_SYRINGE | Freq: Once | INTRAMUSCULAR | Status: AC
  Administered 2023-12-21: 300 ug via SUBCUTANEOUS
  Filled 2023-12-21: qty 0.5

## 2023-12-25 ENCOUNTER — Inpatient Hospital Stay: Payer: Medicare Other

## 2023-12-26 ENCOUNTER — Inpatient Hospital Stay: Attending: Physician Assistant

## 2023-12-26 ENCOUNTER — Other Ambulatory Visit: Payer: Self-pay

## 2023-12-26 ENCOUNTER — Inpatient Hospital Stay

## 2023-12-26 ENCOUNTER — Other Ambulatory Visit: Payer: Self-pay | Admitting: Internal Medicine

## 2023-12-26 DIAGNOSIS — R0981 Nasal congestion: Secondary | ICD-10-CM | POA: Diagnosis not present

## 2023-12-26 DIAGNOSIS — L659 Nonscarring hair loss, unspecified: Secondary | ICD-10-CM | POA: Diagnosis not present

## 2023-12-26 DIAGNOSIS — J910 Malignant pleural effusion: Secondary | ICD-10-CM | POA: Insufficient documentation

## 2023-12-26 DIAGNOSIS — R7989 Other specified abnormal findings of blood chemistry: Secondary | ICD-10-CM | POA: Insufficient documentation

## 2023-12-26 DIAGNOSIS — Z79899 Other long term (current) drug therapy: Secondary | ICD-10-CM | POA: Diagnosis not present

## 2023-12-26 DIAGNOSIS — D61818 Other pancytopenia: Secondary | ICD-10-CM | POA: Diagnosis not present

## 2023-12-26 DIAGNOSIS — Z5189 Encounter for other specified aftercare: Secondary | ICD-10-CM | POA: Diagnosis not present

## 2023-12-26 DIAGNOSIS — C3431 Malignant neoplasm of lower lobe, right bronchus or lung: Secondary | ICD-10-CM | POA: Insufficient documentation

## 2023-12-26 DIAGNOSIS — Z5111 Encounter for antineoplastic chemotherapy: Secondary | ICD-10-CM | POA: Diagnosis present

## 2023-12-26 DIAGNOSIS — C3491 Malignant neoplasm of unspecified part of right bronchus or lung: Secondary | ICD-10-CM

## 2023-12-26 DIAGNOSIS — K5900 Constipation, unspecified: Secondary | ICD-10-CM | POA: Diagnosis not present

## 2023-12-26 LAB — CBC WITH DIFFERENTIAL (CANCER CENTER ONLY)
Abs Immature Granulocytes: 0.18 10*3/uL — ABNORMAL HIGH (ref 0.00–0.07)
Basophils Absolute: 0 10*3/uL (ref 0.0–0.1)
Basophils Relative: 0 %
Eosinophils Absolute: 0.1 10*3/uL (ref 0.0–0.5)
Eosinophils Relative: 2 %
HCT: 25.5 % — ABNORMAL LOW (ref 36.0–46.0)
Hemoglobin: 8.7 g/dL — ABNORMAL LOW (ref 12.0–15.0)
Immature Granulocytes: 4 %
Lymphocytes Relative: 18 %
Lymphs Abs: 0.8 10*3/uL (ref 0.7–4.0)
MCH: 30.2 pg (ref 26.0–34.0)
MCHC: 34.1 g/dL (ref 30.0–36.0)
MCV: 88.5 fL (ref 80.0–100.0)
Monocytes Absolute: 0.7 10*3/uL (ref 0.1–1.0)
Monocytes Relative: 17 %
Neutro Abs: 2.5 10*3/uL (ref 1.7–7.7)
Neutrophils Relative %: 59 %
Platelet Count: 104 10*3/uL — ABNORMAL LOW (ref 150–400)
RBC: 2.88 MIL/uL — ABNORMAL LOW (ref 3.87–5.11)
RDW: 18 % — ABNORMAL HIGH (ref 11.5–15.5)
WBC Count: 4.3 10*3/uL (ref 4.0–10.5)
nRBC: 0 % (ref 0.0–0.2)

## 2023-12-26 LAB — SAMPLE TO BLOOD BANK

## 2023-12-28 ENCOUNTER — Encounter: Payer: Self-pay | Admitting: Physician Assistant

## 2023-12-28 MED ORDER — POTASSIUM CHLORIDE CRYS ER 20 MEQ PO TBCR: 20.0000 meq | EXTENDED_RELEASE_TABLET | Freq: Two times a day (BID) | ORAL | 0 refills | Status: DC

## 2023-12-29 ENCOUNTER — Other Ambulatory Visit: Payer: Self-pay

## 2023-12-31 ENCOUNTER — Encounter: Admitting: Genetic Counselor

## 2024-01-01 ENCOUNTER — Other Ambulatory Visit: Payer: MEDICARE

## 2024-01-01 ENCOUNTER — Ambulatory Visit: Payer: MEDICARE | Admitting: Internal Medicine

## 2024-01-01 ENCOUNTER — Ambulatory Visit: Payer: MEDICARE

## 2024-01-01 NOTE — Progress Notes (Unsigned)
 [facility] OFFICE PROGRESS NOTE  Amanda Potter., PA-C [address] [place] [address]  DIAGNOSIS: Stage IV non-small cell lung cancer, adenocarcinoma. The patient presented with pleural thickening of the right lower lobe as well as malignant effusion.  She was diagnosed in January 2025   Molecular Studies: Biomarker Findings HRD signature - Cannot Be Determined Microsatellite status - Cannot Be Determined ? Tumor Mutational Burden - Cannot Be Determined Genomic Findings For a complete list of the genes assayed, please refer to the Appendix. KRAS G12D DNMT3A F424fs*7 SMAD4 R361H TET2 Q1514fs*31, U0454* 7 Disease relevant genes with no reportable alterations: ALK, BRAF, EGFR, ERBB2, MET, RET, ROS1  PRIOR THERAPY: None  CURRENT THERAPY:  Palliative systemic chemotherapy with carboplatin for an AUC of 5, Alimta 500 mg/m, and Keytruda 200 mg IV every 3 weeks. First dose on 10/30/23.  Starting from cycle #2, her dose of chemotherapy was reduced to carboplatin for an AUC of 4 and Alimta 400 mg/m and Keytruda 200 mg IV   INTERVAL HISTORY: Amanda Potter 78 y.o. female returns to the clinic today for a follow-up visit accompanied by her daughter.  The patient was last seen in the clinic 2 weeks ago for a 1 week follow-up visit after cycle #2. The patient was hospitalized after #1 due to pancytopenia and neutropenic fever.  When she proceeded with cycle #2, she was on dose reduction of the chemotherapy which she tolerated much better with only mild fatigue and some taste alterations due to carboplatin.  She is doing baking soda and salt water rinses.  She did lose more weight since she was last seen.  She did see a member the nutritionist team and she was supposed to see them today.  She is drinking protein supplemental drinks.  She is trying to do mouth rinses.    She did have some cytopenias with cycle #2 for which she required G-CSF injections.  She denies any signs  and symptoms of infection.  At her last appointment she had slight elevations in her LFTs for which she reported taking 325 mg of Tylenol daily.   Otherwise she is doing well.  She is having some nasal congestion secondary to allergies for which she takes Mucinex once a day which is helpful.  She takes Benadryl because Claritin is not effective for her.  Otherwise her main concern today is related to fatigue.  She denies any fever, chills, or night sweats.  Denies any chest pain or hemoptysis.  She may get short of breath with exertion which is stable for her.  She has some cough secondary to nasal congestion and drainage at nighttime.  She denies any nausea, vomiting, or diarrhea.  Her daughter gave her fiber 1 bar and Dulcolax for constipation.  The patient only took 1 dose.  She is taking iron supplement for anemia.  She denies any rashes or skin changes.  She is here today for evaluation repeat blood work before undergoing cycle #3.   MEDICAL HISTORY: Past Medical History:  Diagnosis Date   Deviated nasal septum    Environmental allergies    GERD (gastroesophageal reflux disease)    Hyperlipidemia    Hypertension    Perforation of colon (HCC) 2006   Following colonoscopy   Sinusitis, acute     ALLERGIES:  is allergic to latex and tape.  MEDICATIONS:  Current Outpatient Medications  Medication Sig Dispense Refill   azithromycin (ZITHROMAX Z-PAK) 250 MG tablet Take as directed 6 each  0   methylPREDNISolone (MEDROL DOSEPAK) 4 MG TBPK tablet Use as instructed 21 tablet 0   potassium chloride SA (KLOR-CON M) 20 MEQ tablet Take 1 tablet (20 mEq total) by mouth 2 (two) times daily. 14 tablet 0   albuterol (VENTOLIN HFA) 108 (90 Base) MCG/ACT inhaler Inhale 1 puff into the lungs every 6 (six) hours as needed for shortness of breath.     aspirin 81 MG tablet Take 81 mg by mouth daily at 12 noon.      diphenhydrAMINE (BENADRYL) 25 MG tablet Take 25 mg by mouth daily as needed for allergies.      Ferrous Sulfate (SLOW FE PO) Take 1 tablet by mouth daily.     folic acid (FOLVITE) 1 MG tablet Take 1 tablet (1 mg total) by mouth daily. 30 tablet 2   methimazole (TAPAZOLE) 10 MG tablet Take 2 tablets (20 mg total) by mouth daily. 180 tablet 0   omeprazole (PRILOSEC) 20 MG capsule Take 20 mg by mouth daily.     potassium chloride SA (KLOR-CON M) 20 MEQ tablet Take 1 tablet (20 mEq total) by mouth 2 (two) times daily. 14 tablet 0   prochlorperazine (COMPAZINE) 10 MG tablet Take 1 tablet (10 mg total) by mouth every 6 (six) hours as needed. (Patient taking differently: Take 10 mg by mouth every 6 (six) hours as needed for nausea.) 30 tablet 2   simvastatin (ZOCOR) 20 MG tablet Take 20 mg by mouth at bedtime.      Vitamin D, Ergocalciferol, (DRISDOL) 1.25 MG (50000 UNIT) CAPS capsule Take 50,000 Units by mouth once a week.     No current facility-administered medications for this visit.    SURGICAL HISTORY:  Past Surgical History:  Procedure Laterality Date   CHOLECYSTECTOMY     COLON SURGERY Right 2006   hemicolectomy   COLONOSCOPY WITH PROPOFOL N/A 05/30/2019   Procedure: COLONOSCOPY WITH PROPOFOL;  Surgeon: Jeani Hawking, MD;  Location: [facility];  Service: Endoscopy;  Laterality: N/A;   ENTEROSCOPY N/A 05/30/2019   Procedure: ENTEROSCOPY;  Surgeon: Jeani Hawking, MD;  Location: [facility];  Service: Endoscopy;  Laterality: N/A;   HERNIA REPAIR  [id number]   INCISIONAL HERNIA REPAIR N/A 03/03/2013   Procedure: LAPAROSCOPIC INCISIONAL HERNIA REPAIR;  Surgeon: Adolph Pollack, MD;  Location: [facility];  Service: General;  Laterality: N/A;  LYSIS OF ADHESIONS FOR 60 MINUTES   INSERTION OF MESH N/A 03/03/2013   Procedure: INSERTION OF MESH;  Surgeon: Adolph Pollack, MD;  Location: [facility];  Service: General;  Laterality: N/A;   IR THORACENTESIS ASP PLEURAL SPACE W/IMG GUIDE  11/13/2023   POLYPECTOMY  05/30/2019   Procedure: POLYPECTOMY;  Surgeon: Jeani Hawking, MD;  Location: [facility];   Service: Endoscopy;;   TONSILLECTOMY     TUBAL LIGATION      REVIEW OF SYSTEMS:   Review of Systems  Constitutional: Positive for fatigue, appetite change, and weight loss. Negative for chills and fever.  HENT:  Positive for taste changes and nasal congestion. Negative for mouth sores, nosebleeds, sore throat and trouble swallowing.   Eyes: Negative for eye problems and icterus.  Respiratory: Positive for stable dyspnea on exertion and cough.  Negative for  hemoptysis, and wheezing.   Cardiovascular: Negative for chest pain and leg swelling.  Gastrointestinal: Negative for abdominal pain, constipation, diarrhea, nausea and vomiting.  Genitourinary: Negative for bladder incontinence, difficulty urinating, dysuria, frequency and hematuria.   Musculoskeletal: Negative for back pain, gait problem, neck pain  and neck stiffness.  Skin: Negative for itching and rash.  Neurological: Negative for dizziness, extremity weakness, gait problem, headaches, light-headedness and seizures.  Hematological: Negative for adenopathy. Does not bruise/bleed easily.  Psychiatric/Behavioral: Negative for confusion, depression and sleep disturbance. The patient is not nervous/anxious.   PHYSICAL EXAMINATION:  Blood pressure 113/68, pulse (!) 105, temperature (!) 97.5 F (36.4 C), temperature source Temporal, resp. rate 16, weight 143 lb (64.9 kg), SpO2 98%.  ECOG PERFORMANCE STATUS: 2  Physical Exam  Constitutional: Oriented to person, place, and time and well-developed, well-nourished, and in no distress. No distress.  HENT:  Head: Normocephalic and atraumatic.  Mouth/Throat: Oropharynx is clear and moist. No oropharyngeal exudate.  Eyes: Conjunctivae are normal. Right eye exhibits no discharge. Left eye exhibits no discharge. No scleral icterus.  Neck: Normal range of motion. Neck supple.  Cardiovascular: Normal rate, regular rhythm, normal heart sounds and intact distal pulses.   Pulmonary/Chest: Effort  normal. Decreased breath sounds in base of right lung. No respiratory distress. No wheezes. No rales.  Abdominal: Soft. Bowel sounds are normal. Exhibits no distension and no mass. There is no tenderness.  Musculoskeletal: Normal range of motion. Exhibits no edema.  Lymphadenopathy:    No cervical adenopathy.  Neurological: Alert and oriented to person, place, and time. Exhibits normal muscle tone. Gait normal. Coordination normal.  Skin: Skin is warm and dry. No rash noted. Not diaphoretic. No erythema. No pallor.  Psychiatric: Mood, memory and judgment normal.  Vitals reviewed.  LABORATORY DATA: Lab Results  Component Value Date   WBC 4.5 01/03/2024   HGB 9.3 (L) 01/03/2024   HCT 27.1 (L) 01/03/2024   MCV 89.4 01/03/2024   PLT 418 (H) 01/03/2024      Chemistry      Component Value Date/Time   NA 133 (L) 01/03/2024 0807   K 3.0 (L) 01/03/2024 0807   CL 94 (L) 01/03/2024 0807   CO2 31 01/03/2024 0807   BUN 12 01/03/2024 0807   CREATININE 0.70 01/03/2024 0807      Component Value Date/Time   CALCIUM 8.9 01/03/2024 0807   ALKPHOS 872 (H) 01/03/2024 0807   AST 212 (HH) 01/03/2024 0807   ALT 199 (H) 01/03/2024 0807   BILITOT 1.1 01/03/2024 0807       RADIOGRAPHIC STUDIES:  No results found.   ASSESSMENT/PLAN:  This is a very pleasant 78 year old Caucasian female with Stage IV non-small cell lung cancer, adenocarcinoma. The patient presented with pleural thickening of the right lower lobe as well as malignant effusion.  She was diagnosed in January 2025 Molecular studies by foundation 1 showed no actionable mutations.   The patient is currently on systemic chemotherapy with carboplatin for AUC of 5, Alimta 500 Mg/M2 and Keytruda 200 Mg IV every 3 weeks.  Status post 2 cycle.  Starting from cycle number 2.  Her dose of carboplatin was reduced to AUC of 4 and Alimta 400 Mg/M2 secondary to intolerance and significant pancytopenia requiring hospitalization after the first  cycle.  He had neutropenia with cycle #2 which required Zarxio injections.  Labs were reviewed.  The patient has significantly elevated LFTs today.  Therefore, this could be immunotherapy mediated.  She will not proceed with treatment today as scheduled instead she will receive a Medrol Dosepak and we will reevaluate her next week with blood work and discuss next steps.  She knows to avoid Tylenol and alcohol.  She knows should she develop any unusual abdominal symptoms such as abdominal  pain, jaundice, itching, nausea, vomiting, fevers, etc. that she needs to be reevaluated.  Will see her back for follow-up visit in 1 week repeat labs before undergoing cycle #3.   She will continue to have labs performed on a weekly basis.  I have standing orders in for sample of blood bank should she require any supportive care such as a blood transfusion.  Consider for hemoglobin of less than 8.   She will continue taking her iron supplement.    She will continue using boost and Ensure for protein supplementation.  We discussed salt water rinses, Biotene, and that her taste changes are likely from carboplatin.  She eventually will be off carboplatin when she starts maintenance treatment.  She has seen a member of the nutritionist team.  She will take an antihistamine for her nasal congestion.  Therapy a bacterial component to her sinusitis, I did send her a Z-Pak.   We reviewed side effects of steroids including increased appetite, flushing, and energy.  He has a history of a pleural effusion.  We reviewed signs and symptoms that would warrant reevaluation.  Her breathing is stable at this time.  The patient was advised to call immediately if she has any concerning symptoms in the interval. The patient voices understanding of current disease status and treatment options and is in agreement with the current care plan. All questions were answered. The patient knows to call the clinic with any problems,  questions or concerns. We can certainly see the patient much sooner if necessary         Orders Placed This Encounter  Procedures   CBC with Differential (Cancer Center Only)    Standing Status:   Future    Expected Date:   01/10/2024    Expiration Date:   01/09/2025   CMP (Cancer Center only)    Standing Status:   Future    Expected Date:   01/10/2024    Expiration Date:   01/09/2025   T4    Standing Status:   Future    Expected Date:   01/10/2024    Expiration Date:   01/09/2025   TSH    Standing Status:   Future    Expected Date:   01/10/2024    Expiration Date:   01/09/2025     The total time spent in the appointment was 30-39 minutes  Barbarann Kelly L Ifeanyichukwu Wickham, PA-C 01/03/24

## 2024-01-02 MED FILL — Fosaprepitant Dimeglumine For IV Infusion 150 MG (Base Eq): INTRAVENOUS | Qty: 5 | Status: AC

## 2024-01-03 ENCOUNTER — Inpatient Hospital Stay

## 2024-01-03 ENCOUNTER — Telehealth: Payer: Self-pay | Admitting: Medical Oncology

## 2024-01-03 ENCOUNTER — Inpatient Hospital Stay: Admitting: Physician Assistant

## 2024-01-03 ENCOUNTER — Inpatient Hospital Stay: Admitting: Dietician

## 2024-01-03 ENCOUNTER — Other Ambulatory Visit: Payer: Self-pay | Admitting: Physician Assistant

## 2024-01-03 VITALS — BP 113/68 | HR 105 | Temp 97.5°F | Resp 16 | Wt 143.0 lb

## 2024-01-03 DIAGNOSIS — J910 Malignant pleural effusion: Secondary | ICD-10-CM

## 2024-01-03 DIAGNOSIS — J329 Chronic sinusitis, unspecified: Secondary | ICD-10-CM | POA: Diagnosis not present

## 2024-01-03 DIAGNOSIS — Z5111 Encounter for antineoplastic chemotherapy: Secondary | ICD-10-CM | POA: Diagnosis not present

## 2024-01-03 DIAGNOSIS — C342 Malignant neoplasm of middle lobe, bronchus or lung: Secondary | ICD-10-CM

## 2024-01-03 DIAGNOSIS — R7989 Other specified abnormal findings of blood chemistry: Secondary | ICD-10-CM | POA: Insufficient documentation

## 2024-01-03 DIAGNOSIS — C3431 Malignant neoplasm of lower lobe, right bronchus or lung: Secondary | ICD-10-CM | POA: Diagnosis not present

## 2024-01-03 DIAGNOSIS — E876 Hypokalemia: Secondary | ICD-10-CM

## 2024-01-03 DIAGNOSIS — C3491 Malignant neoplasm of unspecified part of right bronchus or lung: Secondary | ICD-10-CM

## 2024-01-03 DIAGNOSIS — T50905A Adverse effect of unspecified drugs, medicaments and biological substances, initial encounter: Secondary | ICD-10-CM

## 2024-01-03 DIAGNOSIS — R7401 Elevation of levels of liver transaminase levels: Secondary | ICD-10-CM | POA: Diagnosis not present

## 2024-01-03 LAB — CBC WITH DIFFERENTIAL (CANCER CENTER ONLY)
Abs Immature Granulocytes: 0.01 10*3/uL (ref 0.00–0.07)
Basophils Absolute: 0 10*3/uL (ref 0.0–0.1)
Basophils Relative: 1 %
Eosinophils Absolute: 0 10*3/uL (ref 0.0–0.5)
Eosinophils Relative: 1 %
HCT: 27.1 % — ABNORMAL LOW (ref 36.0–46.0)
Hemoglobin: 9.3 g/dL — ABNORMAL LOW (ref 12.0–15.0)
Immature Granulocytes: 0 %
Lymphocytes Relative: 18 %
Lymphs Abs: 0.8 10*3/uL (ref 0.7–4.0)
MCH: 30.7 pg (ref 26.0–34.0)
MCHC: 34.3 g/dL (ref 30.0–36.0)
MCV: 89.4 fL (ref 80.0–100.0)
Monocytes Absolute: 0.4 10*3/uL (ref 0.1–1.0)
Monocytes Relative: 8 %
Neutro Abs: 3.3 10*3/uL (ref 1.7–7.7)
Neutrophils Relative %: 72 %
Platelet Count: 418 10*3/uL — ABNORMAL HIGH (ref 150–400)
RBC: 3.03 MIL/uL — ABNORMAL LOW (ref 3.87–5.11)
RDW: 19.3 % — ABNORMAL HIGH (ref 11.5–15.5)
WBC Count: 4.5 10*3/uL (ref 4.0–10.5)
nRBC: 0 % (ref 0.0–0.2)

## 2024-01-03 LAB — CMP (CANCER CENTER ONLY)
ALT: 199 U/L — ABNORMAL HIGH (ref 0–44)
AST: 212 U/L (ref 15–41)
Albumin: 3.1 g/dL — ABNORMAL LOW (ref 3.5–5.0)
Alkaline Phosphatase: 872 U/L — ABNORMAL HIGH (ref 38–126)
Anion gap: 8 (ref 5–15)
BUN: 12 mg/dL (ref 8–23)
CO2: 31 mmol/L (ref 22–32)
Calcium: 8.9 mg/dL (ref 8.9–10.3)
Chloride: 94 mmol/L — ABNORMAL LOW (ref 98–111)
Creatinine: 0.7 mg/dL (ref 0.44–1.00)
GFR, Estimated: >60 mL/min (ref >60)
Glucose, Bld: 133 mg/dL — ABNORMAL HIGH (ref 70–99)
Potassium: 3 mmol/L — ABNORMAL LOW (ref 3.5–5.1)
Sodium: 133 mmol/L — ABNORMAL LOW (ref 135–145)
Total Bilirubin: 1.1 mg/dL (ref 0.0–1.2)
Total Protein: 6.4 g/dL — ABNORMAL LOW (ref 6.5–8.1)

## 2024-01-03 LAB — SAMPLE TO BLOOD BANK

## 2024-01-03 LAB — TSH: TSH: 13.516 u[IU]/mL — ABNORMAL HIGH (ref 0.350–4.500)

## 2024-01-03 MED ORDER — METHYLPREDNISOLONE 4 MG PO TBPK: ORAL_TABLET | ORAL | 0 refills | Status: DC

## 2024-01-03 MED ORDER — POTASSIUM CHLORIDE CRYS ER 20 MEQ PO TBCR: 20.0000 meq | EXTENDED_RELEASE_TABLET | Freq: Two times a day (BID) | ORAL | 0 refills | Status: DC

## 2024-01-03 MED ORDER — AZITHROMYCIN 250 MG PO TABS: ORAL_TABLET | ORAL | 0 refills | Status: DC

## 2024-01-03 NOTE — Telephone Encounter (Signed)
 CRITICAL VALUE STICKER  CRITICAL VALUE:AST=212  RECEIVER (on-site recipient of call):Erasto Sleight, RN  DATE & TIME NOTIFIED: 01/03/2024 @ 0910  MESSENGER (representative from lab): Hilda Lias MD NOTIFIED:  Heilingoetter. PA-C TIME OF NOTIFICATION:0910  RESPONSE:  Provider seeing pt .

## 2024-01-04 ENCOUNTER — Encounter: Payer: Self-pay | Admitting: Internal Medicine

## 2024-01-04 ENCOUNTER — Telehealth: Payer: Self-pay | Admitting: Physician Assistant

## 2024-01-04 LAB — T4: T4, Total: 8 ug/dL (ref 4.5–12.0)

## 2024-01-04 NOTE — Telephone Encounter (Signed)
 Scheduled appointments with the daughter and the patient is active on MyChart.

## 2024-01-05 ENCOUNTER — Other Ambulatory Visit: Payer: Self-pay

## 2024-01-08 ENCOUNTER — Inpatient Hospital Stay: Payer: Medicare Other

## 2024-01-09 ENCOUNTER — Ambulatory Visit: Payer: Self-pay | Admitting: Internal Medicine

## 2024-01-09 ENCOUNTER — Other Ambulatory Visit: Payer: 59

## 2024-01-09 ENCOUNTER — Ambulatory Visit: Payer: Self-pay

## 2024-01-09 MED FILL — Fosaprepitant Dimeglumine For IV Infusion 150 MG (Base Eq): INTRAVENOUS | Qty: 5 | Status: AC

## 2024-01-10 ENCOUNTER — Inpatient Hospital Stay

## 2024-01-10 ENCOUNTER — Inpatient Hospital Stay (HOSPITAL_BASED_OUTPATIENT_CLINIC_OR_DEPARTMENT_OTHER): Admitting: Internal Medicine

## 2024-01-10 ENCOUNTER — Other Ambulatory Visit: Payer: Self-pay | Admitting: Internal Medicine

## 2024-01-10 VITALS — HR 100

## 2024-01-10 VITALS — BP 117/79 | HR 102 | Temp 98.0°F | Resp 16 | Ht 66.0 in | Wt 140.0 lb

## 2024-01-10 DIAGNOSIS — C3490 Malignant neoplasm of unspecified part of unspecified bronchus or lung: Secondary | ICD-10-CM

## 2024-01-10 DIAGNOSIS — C342 Malignant neoplasm of middle lobe, bronchus or lung: Secondary | ICD-10-CM

## 2024-01-10 DIAGNOSIS — Z5111 Encounter for antineoplastic chemotherapy: Secondary | ICD-10-CM | POA: Diagnosis not present

## 2024-01-10 DIAGNOSIS — C3491 Malignant neoplasm of unspecified part of right bronchus or lung: Secondary | ICD-10-CM

## 2024-01-10 LAB — SAMPLE TO BLOOD BANK

## 2024-01-10 LAB — CBC WITH DIFFERENTIAL (CANCER CENTER ONLY)
Abs Immature Granulocytes: 0.01 10*3/uL (ref 0.00–0.07)
Basophils Absolute: 0 10*3/uL (ref 0.0–0.1)
Basophils Relative: 1 %
Eosinophils Absolute: 0 10*3/uL (ref 0.0–0.5)
Eosinophils Relative: 0 %
HCT: 29.9 % — ABNORMAL LOW (ref 36.0–46.0)
Hemoglobin: 10.3 g/dL — ABNORMAL LOW (ref 12.0–15.0)
Immature Granulocytes: 0 %
Lymphocytes Relative: 31 %
Lymphs Abs: 1 10*3/uL (ref 0.7–4.0)
MCH: 31.1 pg (ref 26.0–34.0)
MCHC: 34.4 g/dL (ref 30.0–36.0)
MCV: 90.3 fL (ref 80.0–100.0)
Monocytes Absolute: 0.1 10*3/uL (ref 0.1–1.0)
Monocytes Relative: 2 %
Neutro Abs: 2.1 10*3/uL (ref 1.7–7.7)
Neutrophils Relative %: 66 %
Platelet Count: 426 10*3/uL — ABNORMAL HIGH (ref 150–400)
RBC: 3.31 MIL/uL — ABNORMAL LOW (ref 3.87–5.11)
RDW: 18.3 % — ABNORMAL HIGH (ref 11.5–15.5)
WBC Count: 3.2 10*3/uL — ABNORMAL LOW (ref 4.0–10.5)
nRBC: 0 % (ref 0.0–0.2)

## 2024-01-10 LAB — CMP (CANCER CENTER ONLY)
ALT: 143 U/L — ABNORMAL HIGH (ref 0–44)
AST: 122 U/L — ABNORMAL HIGH (ref 15–41)
Albumin: 3.2 g/dL — ABNORMAL LOW (ref 3.5–5.0)
Alkaline Phosphatase: 955 U/L — ABNORMAL HIGH (ref 38–126)
Anion gap: 7 (ref 5–15)
BUN: 13 mg/dL (ref 8–23)
CO2: 25 mmol/L (ref 22–32)
Calcium: 9 mg/dL (ref 8.9–10.3)
Chloride: 99 mmol/L (ref 98–111)
Creatinine: 0.73 mg/dL (ref 0.44–1.00)
GFR, Estimated: >60 mL/min (ref >60)
Glucose, Bld: 108 mg/dL — ABNORMAL HIGH (ref 70–99)
Potassium: 4.9 mmol/L (ref 3.5–5.1)
Sodium: 131 mmol/L — ABNORMAL LOW (ref 135–145)
Total Bilirubin: 1 mg/dL (ref 0.0–1.2)
Total Protein: 6.4 g/dL — ABNORMAL LOW (ref 6.5–8.1)

## 2024-01-10 LAB — TSH: TSH: 19.496 u[IU]/mL — ABNORMAL HIGH (ref 0.350–4.500)

## 2024-01-10 MED ORDER — CYANOCOBALAMIN 1000 MCG/ML IJ SOLN
1000.0000 ug | Freq: Once | INTRAMUSCULAR | Status: AC
Start: 2024-01-10 — End: 2024-01-10
  Administered 2024-01-10: 1000 ug via INTRAMUSCULAR
  Filled 2024-01-10: qty 1

## 2024-01-10 MED ORDER — SODIUM CHLORIDE 0.9 % IV SOLN
400.0000 mg/m2 | Freq: Once | INTRAVENOUS | Status: AC
  Administered 2024-01-10: 700 mg via INTRAVENOUS
  Filled 2024-01-10: qty 20

## 2024-01-10 MED ORDER — LEVOTHYROXINE SODIUM 50 MCG PO TABS: 50.0000 ug | ORAL_TABLET | Freq: Every day | ORAL | 2 refills | Status: DC

## 2024-01-10 MED ORDER — PALONOSETRON HCL INJECTION 0.25 MG/5ML
0.2500 mg | Freq: Once | INTRAVENOUS | Status: AC
  Administered 2024-01-10: 0.25 mg via INTRAVENOUS
  Filled 2024-01-10: qty 5

## 2024-01-10 MED ORDER — SODIUM CHLORIDE 0.9% FLUSH: 10.0000 mL | INTRAVENOUS | Status: DC | PRN

## 2024-01-10 MED ORDER — SODIUM CHLORIDE 0.9 % IV SOLN: INTRAVENOUS | Status: DC

## 2024-01-10 MED ORDER — DEXAMETHASONE SODIUM PHOSPHATE 10 MG/ML IJ SOLN
10.0000 mg | Freq: Once | INTRAMUSCULAR | Status: AC
Start: 2024-01-10 — End: 2024-01-10
  Administered 2024-01-10: 10 mg via INTRAVENOUS
  Filled 2024-01-10: qty 1

## 2024-01-10 MED ORDER — SODIUM CHLORIDE 0.9 % IV SOLN
150.0000 mg | Freq: Once | INTRAVENOUS | Status: AC
  Administered 2024-01-10: 150 mg via INTRAVENOUS
  Filled 2024-01-10: qty 5
  Filled 2024-01-10: qty 150

## 2024-01-10 MED ORDER — SODIUM CHLORIDE 0.9 % IV SOLN
310.4000 mg | Freq: Once | INTRAVENOUS | Status: AC
  Administered 2024-01-10: 310 mg via INTRAVENOUS
  Filled 2024-01-10: qty 31

## 2024-01-10 NOTE — Progress Notes (Signed)
 LFTs are improving but still elevated: AST: 212>122 and ALT: 199>143. Will hold Keytruda today and proceed with carbo/Alimta per Dr. Marguerita Shih.  Avery Klingbeil, PharmD, MBA

## 2024-01-10 NOTE — Progress Notes (Signed)
 [facility] Telephone:([phone]   [phone]  OFFICE PROGRESS NOTE  Richmond Campbell., PA-C [address] [place] [address]  DIAGNOSIS: Stage IV non-small cell lung cancer, adenocarcinoma. The patient presented with pleural thickening of the right lower lobe as well as malignant effusion.  She was diagnosed in January 2025   Molecular Studies: Biomarker Findings HRD signature - Cannot Be Determined Microsatellite status - Cannot Be Determined ? Tumor Mutational Burden - Cannot Be Determined Genomic Findings For a complete list of the genes assayed, please refer to the Appendix. KRAS G12D DNMT3A F483fs*7 SMAD4 R361H TET2 Q154fs*31, U1324* 7 Disease relevant genes with no reportable alterations: ALK, BRAF, EGFR, ERBB2, MET, RET, ROS1   PRIOR THERAPY: None   CURRENT THERAPY: Palliative systemic chemotherapy with carboplatin for an AUC of 5, Alimta 500 mg/m, and Keytruda 200 mg IV every 3 weeks.  First dose expected next week on 10/30/2023.  INTERVAL HISTORY: Amanda Potter 78 y.o. female returns to the clinic today for follow-up visit accompanied by her daughter.Discussed the use of AI scribe software for clinical note transcription with the patient, who gave verbal consent to proceed.  History of Present Illness   Amanda Potter is an 78 year old female undergoing chemotherapy who presents for evaluation before starting cycle number three.  She experienced fatigue after the second cycle of chemotherapy, although it was not as severe as the first cycle, which required hospitalization due to low blood counts. No hospitalization was needed after the second cycle.  Her current medications include a Z-Pak, which she started on Saturday, and she has been using an inhaler due to chest heaviness experienced two to three days ago, likely related to a recent rainstorm affecting her allergies.  She experienced constipation for about three days,  which was relieved with Dulcolax soft chews. She did not need to use the maximum strength Exlax that was purchased.  She has no appetite and low energy, despite being on steroids, which were expected to help with these symptoms.        MEDICAL HISTORY: Past Medical History:  Diagnosis Date   Deviated nasal septum    Environmental allergies    GERD (gastroesophageal reflux disease)    Hyperlipidemia    Hypertension    Perforation of colon (HCC) 2006   Following colonoscopy   Sinusitis, acute     ALLERGIES:  is allergic to latex and tape.  MEDICATIONS:  Current Outpatient Medications  Medication Sig Dispense Refill   albuterol (VENTOLIN HFA) 108 (90 Base) MCG/ACT inhaler Inhale 1 puff into the lungs every 6 (six) hours as needed for shortness of breath.     aspirin 81 MG tablet Take 81 mg by mouth daily at 12 noon.      azithromycin (ZITHROMAX Z-PAK) 250 MG tablet Take as directed 6 each 0   diphenhydrAMINE (BENADRYL) 25 MG tablet Take 25 mg by mouth daily as needed for allergies.     Ferrous Sulfate (SLOW FE PO) Take 1 tablet by mouth daily.     folic acid (FOLVITE) 1 MG tablet Take 1 tablet (1 mg total) by mouth daily. 30 tablet 2   methimazole (TAPAZOLE) 10 MG tablet Take 2 tablets (20 mg total) by mouth daily. 180 tablet 0   methylPREDNISolone (MEDROL DOSEPAK) 4 MG TBPK tablet Use as instructed 21 tablet 0   omeprazole (PRILOSEC) 20 MG capsule Take 20 mg by mouth daily.     potassium chloride SA (  KLOR-CON M) 20 MEQ tablet Take 1 tablet (20 mEq total) by mouth 2 (two) times daily. 14 tablet 0   potassium chloride SA (KLOR-CON M) 20 MEQ tablet Take 1 tablet (20 mEq total) by mouth 2 (two) times daily. 14 tablet 0   prochlorperazine (COMPAZINE) 10 MG tablet Take 1 tablet (10 mg total) by mouth every 6 (six) hours as needed. (Patient taking differently: Take 10 mg by mouth every 6 (six) hours as needed for nausea.) 30 tablet 2   simvastatin (ZOCOR) 20 MG tablet Take 20 mg by mouth  at bedtime.      Vitamin D, Ergocalciferol, (DRISDOL) 1.25 MG (50000 UNIT) CAPS capsule Take 50,000 Units by mouth once a week.     No current facility-administered medications for this visit.    SURGICAL HISTORY:  Past Surgical History:  Procedure Laterality Date   CHOLECYSTECTOMY     COLON SURGERY Right 2006   hemicolectomy   COLONOSCOPY WITH PROPOFOL N/A 05/30/2019   Procedure: COLONOSCOPY WITH PROPOFOL;  Surgeon: Jeani Hawking, MD;  Location: [facility];  Service: Endoscopy;  Laterality: N/A;   ENTEROSCOPY N/A 05/30/2019   Procedure: ENTEROSCOPY;  Surgeon: Jeani Hawking, MD;  Location: [facility];  Service: Endoscopy;  Laterality: N/A;   HERNIA REPAIR  [id number]   INCISIONAL HERNIA REPAIR N/A 03/03/2013   Procedure: LAPAROSCOPIC INCISIONAL HERNIA REPAIR;  Surgeon: Adolph Pollack, MD;  Location: [facility];  Service: General;  Laterality: N/A;  LYSIS OF ADHESIONS FOR 60 MINUTES   INSERTION OF MESH N/A 03/03/2013   Procedure: INSERTION OF MESH;  Surgeon: Adolph Pollack, MD;  Location: [facility];  Service: General;  Laterality: N/A;   IR THORACENTESIS ASP PLEURAL SPACE W/IMG GUIDE  11/13/2023   POLYPECTOMY  05/30/2019   Procedure: POLYPECTOMY;  Surgeon: Jeani Hawking, MD;  Location: [facility];  Service: Endoscopy;;   TONSILLECTOMY     TUBAL LIGATION      REVIEW OF SYSTEMS:  Constitutional: positive for anorexia, fatigue, and weight loss Eyes: negative Ears, nose, mouth, throat, and face: negative Respiratory: negative Cardiovascular: negative Gastrointestinal: negative Genitourinary:negative Integument/breast: negative Hematologic/lymphatic: negative Musculoskeletal:positive for arthralgias and muscle weakness Neurological: negative Behavioral/Psych: negative Endocrine: negative Allergic/Immunologic: negative   PHYSICAL EXAMINATION: General appearance: alert, cooperative, fatigued, and no distress Head: Normocephalic, without obvious abnormality, atraumatic Neck: no  adenopathy, no JVD, supple, symmetrical, trachea midline, and thyroid not enlarged, symmetric, no tenderness/mass/nodules Lymph nodes: Cervical, supraclavicular, and axillary nodes normal. Resp: clear to auscultation bilaterally Back: symmetric, no curvature. ROM normal. No CVA tenderness. Cardio: regular rate and rhythm, S1, S2 normal, no murmur, click, rub or gallop GI: soft, non-tender; bowel sounds normal; no masses,  no organomegaly Extremities: extremities normal, atraumatic, no cyanosis or edema Neurologic: Alert and oriented X 3, normal strength and tone. Normal symmetric reflexes. Normal coordination and gait  ECOG PERFORMANCE STATUS: 1 - Symptomatic but completely ambulatory  Blood pressure 117/79, pulse (!) 102, temperature 98 F (36.7 C), temperature source Temporal, resp. rate 16, height 5\' 6"  (1.676 m), weight 140 lb (63.5 kg), SpO2 100%.  LABORATORY DATA: Lab Results  Component Value Date   WBC 3.2 (L) 01/10/2024   HGB 10.3 (L) 01/10/2024   HCT 29.9 (L) 01/10/2024   MCV 90.3 01/10/2024   PLT 426 (H) 01/10/2024      Chemistry      Component Value Date/Time   NA 133 (L) 01/03/2024 0807   K 3.0 (L) 01/03/2024 0807   CL 94 (L) 01/03/2024 0807   CO2  31 01/03/2024 0807   BUN 12 01/03/2024 0807   CREATININE 0.70 01/03/2024 0807      Component Value Date/Time   CALCIUM 8.9 01/03/2024 0807   ALKPHOS 872 (H) 01/03/2024 0807   AST 212 (HH) 01/03/2024 0807   ALT 199 (H) 01/03/2024 0807   BILITOT 1.1 01/03/2024 0807       RADIOGRAPHIC STUDIES: No results found.   ASSESSMENT AND PLAN: This is a very pleasant 78 years old white female with Stage IV non-small cell lung cancer, adenocarcinoma. The patient presented with pleural thickening of the right lower lobe as well as malignant effusion.  She was diagnosed in January 2025 Molecular studies by foundation 1 showed no actionable mutations. The patient is currently on systemic chemotherapy with carboplatin for AUC of  5, Alimta 500 Mg/M2 and Keytruda 200 Mg IV every 3 weeks.  Status post 2 cycles. Starting from cycle number.  Her dose of carboplatin will be reduced to AUC of 4 and Alimta 400 Mg/M2 secondary to intolerance and significant pancytopenia requiring hospitalization after the first cycle.  Chemotherapy management She is undergoing chemotherapy and is here for evaluation before starting the third cycle. The second cycle was better tolerated than the first, with fatigue but no hospitalization. The chemotherapy dose was reduced after the first cycle to prevent severe side effects. Current blood counts are acceptable for treatment, with hemoglobin at 10.3, white blood count at 3.2, and platelets at 426,000. Liver function tests are pending. A CT scan will be performed 10 days before the next visit to assess treatment response. - Proceed with the third cycle of chemotherapy with Only Carboplatin and Alimta. Amanda Potter will be on hold secondary to elevated liver enzymes. - Monitor blood counts closely - Order CT scan 10 days before the next visit to assess treatment response  Constipation She experienced constipation for about three days, which was resolved with Dulcolax soft chews. The constipation was likely related to chemotherapy. - Advise keeping Miralax on hand for future constipation - Use milk of magnesia if no bowel movement for 3-4 days  Alopecia She has experienced hair loss, which is a common side effect of chemotherapy.  Chest heaviness She reported chest heaviness a few days ago, possibly related to changes in barometric pressure affecting her allergies. She used her inhaler a couple of times, which provided relief. - Ensure inhaler is available for use as needed   The patient was advised to call immediately if she has any other concerning symptoms in the interval. The patient voices understanding of current disease status and treatment options and is in agreement with the current care  plan.  All questions were answered. The patient knows to call the clinic with any problems, questions or concerns. We can certainly see the patient much sooner if necessary.  The total time spent in the appointment was 30 minutes.  Disclaimer: This note was dictated with voice recognition software. Similar sounding words can inadvertently be transcribed and may not be corrected upon review.

## 2024-01-10 NOTE — Patient Instructions (Signed)
 [facility] - A DEPT OF [facility]  Discharge Instructions: Thank you for choosing [facility] to provide your oncology and hematology care.   If you have a lab appointment with the Cancer Center, please go directly to the Cancer Center and check in at the registration area.   Wear comfortable clothing and clothing appropriate for easy access to any Portacath or PICC line.   We strive to give you quality time with your provider. You may need to reschedule your appointment if you arrive late (15 or more minutes).  Arriving late affects you and other patients whose appointments are after yours.  Also, if you miss three or more appointments without notifying the office, you may be dismissed from the clinic at the provider's discretion.      For prescription refill requests, have your pharmacy contact our office and allow 72 hours for refills to be completed.    Today you received the following chemotherapy and/or immunotherapy agents :  Alimta,  Carboplatin.   To help prevent nausea and vomiting after your treatment, we encourage you to take your nausea medication as directed.  BELOW ARE SYMPTOMS THAT SHOULD BE REPORTED IMMEDIATELY: *FEVER GREATER THAN 100.4 F (38 C) OR HIGHER *CHILLS OR SWEATING *NAUSEA AND VOMITING THAT IS NOT CONTROLLED WITH YOUR NAUSEA MEDICATION *UNUSUAL SHORTNESS OF BREATH *UNUSUAL BRUISING OR BLEEDING *URINARY PROBLEMS (pain or burning when urinating, or frequent urination) *BOWEL PROBLEMS (unusual diarrhea, constipation, pain near the anus) TENDERNESS IN MOUTH AND THROAT WITH OR WITHOUT PRESENCE OF ULCERS (sore throat, sores in mouth, or a toothache) UNUSUAL RASH, SWELLING OR PAIN  UNUSUAL VAGINAL DISCHARGE OR ITCHING   Items with * indicate a potential emergency and should be followed up as soon as possible or go to the Emergency Department if any problems should occur.  Please show the CHEMOTHERAPY ALERT CARD or  IMMUNOTHERAPY ALERT CARD at check-in to the Emergency Department and triage nurse.  Should you have questions after your visit or need to cancel or reschedule your appointment, please contact [facility] - A DEPT OF [facility]  Dept: [phone]  and follow the prompts.  Office hours are 8:00 a.m. to 4:30 p.m. Monday - Friday. Please note that voicemails left after 4:00 p.m. may not be returned until the following business day.  We are closed weekends and major holidays. You have access to a nurse at all times for urgent questions. Please call the main number to the clinic Dept: [phone] and follow the prompts.   For any non-urgent questions, you may also contact your provider using MyChart. We now offer e-Visits for anyone 89 and older to request care online for non-urgent symptoms. For details visit [email or web address].   Also download the MyChart app! Go to the app store, search "MyChart", open the app, select [facility], and log in with your MyChart username and password.

## 2024-01-11 ENCOUNTER — Other Ambulatory Visit: Payer: Self-pay

## 2024-01-11 LAB — T4: T4, Total: 8 ug/dL (ref 4.5–12.0)

## 2024-01-14 ENCOUNTER — Telehealth: Payer: Self-pay

## 2024-01-14 NOTE — Telephone Encounter (Signed)
 Spoke with patients daughter, Amanda Potter this morning in regards to labs results.  Per Dr. Marguerita Shih- lab results showed elevated TSH.  Sending in Synthroid  to the pharmacy.   Daughter stated that when she was in the hospital, doctor prescribed patient Methimazole  for elevated TSH.  Daughter asked if it was okay to finish that prescription before starting Synthroid .  Informed daughter that its okay to finish Methimazole  if she wants to OR go ahead and start Synthroid . Daughter voiced understanding.

## 2024-01-15 ENCOUNTER — Inpatient Hospital Stay: Payer: Medicare Other

## 2024-01-16 ENCOUNTER — Other Ambulatory Visit: Payer: Self-pay | Admitting: Medical Oncology

## 2024-01-16 DIAGNOSIS — D649 Anemia, unspecified: Secondary | ICD-10-CM

## 2024-01-17 ENCOUNTER — Other Ambulatory Visit: Payer: Self-pay | Admitting: Pharmacist

## 2024-01-17 ENCOUNTER — Other Ambulatory Visit: Payer: Self-pay

## 2024-01-17 ENCOUNTER — Inpatient Hospital Stay

## 2024-01-17 ENCOUNTER — Other Ambulatory Visit: Payer: Self-pay | Admitting: Physician Assistant

## 2024-01-17 ENCOUNTER — Emergency Department (HOSPITAL_COMMUNITY)

## 2024-01-17 ENCOUNTER — Telehealth: Payer: Self-pay

## 2024-01-17 ENCOUNTER — Other Ambulatory Visit: Payer: Self-pay | Admitting: Medical Oncology

## 2024-01-17 ENCOUNTER — Telehealth: Payer: Self-pay | Admitting: Medical Oncology

## 2024-01-17 ENCOUNTER — Inpatient Hospital Stay (HOSPITAL_COMMUNITY)
Admission: EM | Admit: 2024-01-17 | Discharge: 2024-02-24 | DRG: 180 | Disposition: E | Attending: Internal Medicine | Admitting: Internal Medicine

## 2024-01-17 ENCOUNTER — Encounter (HOSPITAL_COMMUNITY): Payer: Self-pay | Admitting: Internal Medicine

## 2024-01-17 DIAGNOSIS — E039 Hypothyroidism, unspecified: Secondary | ICD-10-CM | POA: Diagnosis present

## 2024-01-17 DIAGNOSIS — Z792 Long term (current) use of antibiotics: Secondary | ICD-10-CM

## 2024-01-17 DIAGNOSIS — J302 Other seasonal allergic rhinitis: Secondary | ICD-10-CM | POA: Diagnosis present

## 2024-01-17 DIAGNOSIS — R627 Adult failure to thrive: Secondary | ICD-10-CM | POA: Diagnosis present

## 2024-01-17 DIAGNOSIS — I10 Essential (primary) hypertension: Secondary | ICD-10-CM | POA: Diagnosis present

## 2024-01-17 DIAGNOSIS — Z66 Do not resuscitate: Secondary | ICD-10-CM | POA: Diagnosis present

## 2024-01-17 DIAGNOSIS — D709 Neutropenia, unspecified: Secondary | ICD-10-CM | POA: Diagnosis not present

## 2024-01-17 DIAGNOSIS — R6520 Severe sepsis without septic shock: Secondary | ICD-10-CM

## 2024-01-17 DIAGNOSIS — E876 Hypokalemia: Secondary | ICD-10-CM | POA: Diagnosis present

## 2024-01-17 DIAGNOSIS — Z91040 Latex allergy status: Secondary | ICD-10-CM

## 2024-01-17 DIAGNOSIS — R748 Abnormal levels of other serum enzymes: Secondary | ICD-10-CM | POA: Diagnosis present

## 2024-01-17 DIAGNOSIS — Z9049 Acquired absence of other specified parts of digestive tract: Secondary | ICD-10-CM

## 2024-01-17 DIAGNOSIS — J910 Malignant pleural effusion: Secondary | ICD-10-CM | POA: Diagnosis present

## 2024-01-17 DIAGNOSIS — R4589 Other symptoms and signs involving emotional state: Secondary | ICD-10-CM

## 2024-01-17 DIAGNOSIS — Z604 Social exclusion and rejection: Secondary | ICD-10-CM | POA: Diagnosis present

## 2024-01-17 DIAGNOSIS — C3491 Malignant neoplasm of unspecified part of right bronchus or lung: Secondary | ICD-10-CM | POA: Diagnosis present

## 2024-01-17 DIAGNOSIS — Z79899 Other long term (current) drug therapy: Secondary | ICD-10-CM

## 2024-01-17 DIAGNOSIS — T451X5A Adverse effect of antineoplastic and immunosuppressive drugs, initial encounter: Secondary | ICD-10-CM | POA: Diagnosis present

## 2024-01-17 DIAGNOSIS — D696 Thrombocytopenia, unspecified: Secondary | ICD-10-CM

## 2024-01-17 DIAGNOSIS — R7401 Elevation of levels of liver transaminase levels: Secondary | ICD-10-CM | POA: Diagnosis not present

## 2024-01-17 DIAGNOSIS — Z79890 Hormone replacement therapy (postmenopausal): Secondary | ICD-10-CM

## 2024-01-17 DIAGNOSIS — D701 Agranulocytosis secondary to cancer chemotherapy: Secondary | ICD-10-CM

## 2024-01-17 DIAGNOSIS — B957 Other staphylococcus as the cause of diseases classified elsewhere: Secondary | ICD-10-CM | POA: Diagnosis present

## 2024-01-17 DIAGNOSIS — Z85118 Personal history of other malignant neoplasm of bronchus and lung: Secondary | ICD-10-CM

## 2024-01-17 DIAGNOSIS — E878 Other disorders of electrolyte and fluid balance, not elsewhere classified: Secondary | ICD-10-CM | POA: Diagnosis present

## 2024-01-17 DIAGNOSIS — R7881 Bacteremia: Secondary | ICD-10-CM | POA: Diagnosis present

## 2024-01-17 DIAGNOSIS — R54 Age-related physical debility: Secondary | ICD-10-CM | POA: Diagnosis present

## 2024-01-17 DIAGNOSIS — R5081 Fever presenting with conditions classified elsewhere: Secondary | ICD-10-CM | POA: Diagnosis present

## 2024-01-17 DIAGNOSIS — R531 Weakness: Secondary | ICD-10-CM | POA: Diagnosis not present

## 2024-01-17 DIAGNOSIS — Z83438 Family history of other disorder of lipoprotein metabolism and other lipidemia: Secondary | ICD-10-CM

## 2024-01-17 DIAGNOSIS — E871 Hypo-osmolality and hyponatremia: Secondary | ICD-10-CM | POA: Diagnosis present

## 2024-01-17 DIAGNOSIS — E872 Acidosis, unspecified: Secondary | ICD-10-CM | POA: Diagnosis present

## 2024-01-17 DIAGNOSIS — A419 Sepsis, unspecified organism: Secondary | ICD-10-CM | POA: Diagnosis present

## 2024-01-17 DIAGNOSIS — Z8719 Personal history of other diseases of the digestive system: Secondary | ICD-10-CM

## 2024-01-17 DIAGNOSIS — E860 Dehydration: Secondary | ICD-10-CM | POA: Diagnosis present

## 2024-01-17 DIAGNOSIS — R6510 Systemic inflammatory response syndrome (SIRS) of non-infectious origin without acute organ dysfunction: Principal | ICD-10-CM

## 2024-01-17 DIAGNOSIS — E861 Hypovolemia: Secondary | ICD-10-CM | POA: Diagnosis present

## 2024-01-17 DIAGNOSIS — D61810 Antineoplastic chemotherapy induced pancytopenia: Secondary | ICD-10-CM | POA: Diagnosis present

## 2024-01-17 DIAGNOSIS — Z515 Encounter for palliative care: Secondary | ICD-10-CM | POA: Diagnosis not present

## 2024-01-17 DIAGNOSIS — Z1152 Encounter for screening for COVID-19: Secondary | ICD-10-CM | POA: Diagnosis not present

## 2024-01-17 DIAGNOSIS — K5900 Constipation, unspecified: Secondary | ICD-10-CM | POA: Diagnosis present

## 2024-01-17 DIAGNOSIS — Z8249 Family history of ischemic heart disease and other diseases of the circulatory system: Secondary | ICD-10-CM

## 2024-01-17 DIAGNOSIS — Z7982 Long term (current) use of aspirin: Secondary | ICD-10-CM

## 2024-01-17 DIAGNOSIS — K219 Gastro-esophageal reflux disease without esophagitis: Secondary | ICD-10-CM | POA: Diagnosis present

## 2024-01-17 DIAGNOSIS — R0681 Apnea, not elsewhere classified: Secondary | ICD-10-CM | POA: Diagnosis not present

## 2024-01-17 DIAGNOSIS — C799 Secondary malignant neoplasm of unspecified site: Secondary | ICD-10-CM | POA: Diagnosis present

## 2024-01-17 DIAGNOSIS — Z9851 Tubal ligation status: Secondary | ICD-10-CM

## 2024-01-17 DIAGNOSIS — R0602 Shortness of breath: Secondary | ICD-10-CM | POA: Diagnosis not present

## 2024-01-17 DIAGNOSIS — D649 Anemia, unspecified: Secondary | ICD-10-CM

## 2024-01-17 DIAGNOSIS — C3490 Malignant neoplasm of unspecified part of unspecified bronchus or lung: Secondary | ICD-10-CM | POA: Diagnosis not present

## 2024-01-17 DIAGNOSIS — Z91048 Other nonmedicinal substance allergy status: Secondary | ICD-10-CM

## 2024-01-17 DIAGNOSIS — R197 Diarrhea, unspecified: Secondary | ICD-10-CM | POA: Diagnosis not present

## 2024-01-17 DIAGNOSIS — E785 Hyperlipidemia, unspecified: Secondary | ICD-10-CM | POA: Diagnosis present

## 2024-01-17 DIAGNOSIS — B952 Enterococcus as the cause of diseases classified elsewhere: Secondary | ICD-10-CM | POA: Diagnosis present

## 2024-01-17 DIAGNOSIS — Z7189 Other specified counseling: Secondary | ICD-10-CM

## 2024-01-17 LAB — BASIC METABOLIC PANEL WITH GFR
Anion gap: 9 (ref 5–15)
BUN: 18 mg/dL (ref 8–23)
CO2: 23 mmol/L (ref 22–32)
Calcium: 8 mg/dL — ABNORMAL LOW (ref 8.9–10.3)
Chloride: 92 mmol/L — ABNORMAL LOW (ref 98–111)
Creatinine, Ser: 0.74 mg/dL (ref 0.44–1.00)
GFR, Estimated: >60 mL/min (ref >60)
Glucose, Bld: 125 mg/dL — ABNORMAL HIGH (ref 70–99)
Potassium: 3.2 mmol/L — ABNORMAL LOW (ref 3.5–5.1)
Sodium: 124 mmol/L — ABNORMAL LOW (ref 135–145)

## 2024-01-17 LAB — CMP (CANCER CENTER ONLY)
ALT: 55 U/L — ABNORMAL HIGH (ref 0–44)
AST: 37 U/L (ref 15–41)
Albumin: 3.1 g/dL — ABNORMAL LOW (ref 3.5–5.0)
Alkaline Phosphatase: 745 U/L — ABNORMAL HIGH (ref 38–126)
Anion gap: 4 — ABNORMAL LOW (ref 5–15)
BUN: 17 mg/dL (ref 8–23)
CO2: 30 mmol/L (ref 22–32)
Calcium: 8.4 mg/dL — ABNORMAL LOW (ref 8.9–10.3)
Chloride: 91 mmol/L — ABNORMAL LOW (ref 98–111)
Creatinine: 0.79 mg/dL (ref 0.44–1.00)
GFR, Estimated: >60 mL/min (ref >60)
Glucose, Bld: 130 mg/dL — ABNORMAL HIGH (ref 70–99)
Potassium: 3.9 mmol/L (ref 3.5–5.1)
Sodium: 125 mmol/L — ABNORMAL LOW (ref 135–145)
Total Bilirubin: 0.6 mg/dL (ref 0.0–1.2)
Total Protein: 6.4 g/dL — ABNORMAL LOW (ref 6.5–8.1)

## 2024-01-17 LAB — CBC WITH DIFFERENTIAL (CANCER CENTER ONLY)
Abs Immature Granulocytes: 0 10*3/uL (ref 0.00–0.07)
Basophils Absolute: 0 10*3/uL (ref 0.0–0.1)
Basophils Relative: 0 %
Eosinophils Absolute: 0 10*3/uL (ref 0.0–0.5)
Eosinophils Relative: 0 %
HCT: 24.4 % — ABNORMAL LOW (ref 36.0–46.0)
Hemoglobin: 8.6 g/dL — ABNORMAL LOW (ref 12.0–15.0)
Immature Granulocytes: 0 %
Lymphocytes Relative: 92 %
Lymphs Abs: 0.3 10*3/uL — ABNORMAL LOW (ref 0.7–4.0)
MCH: 31.3 pg (ref 26.0–34.0)
MCHC: 35.2 g/dL (ref 30.0–36.0)
MCV: 88.7 fL (ref 80.0–100.0)
Monocytes Absolute: 0 10*3/uL — ABNORMAL LOW (ref 0.1–1.0)
Monocytes Relative: 4 %
Neutro Abs: 0 10*3/uL — CL (ref 1.7–7.7)
Neutrophils Relative %: 4 %
Platelet Count: 147 10*3/uL — ABNORMAL LOW (ref 150–400)
RBC: 2.75 MIL/uL — ABNORMAL LOW (ref 3.87–5.11)
RDW: 14.7 % (ref 11.5–15.5)
Smear Review: NORMAL
WBC Count: 0.3 10*3/uL — CL (ref 4.0–10.5)
nRBC: 0 % (ref 0.0–0.2)

## 2024-01-17 LAB — COMPREHENSIVE METABOLIC PANEL WITH GFR
ALT: 49 U/L — ABNORMAL HIGH (ref 0–44)
AST: 37 U/L (ref 15–41)
Albumin: 2.6 g/dL — ABNORMAL LOW (ref 3.5–5.0)
Alkaline Phosphatase: 589 U/L — ABNORMAL HIGH (ref 38–126)
Anion gap: 12 (ref 5–15)
BUN: 19 mg/dL (ref 8–23)
CO2: 21 mmol/L — ABNORMAL LOW (ref 22–32)
Calcium: 8 mg/dL — ABNORMAL LOW (ref 8.9–10.3)
Chloride: 92 mmol/L — ABNORMAL LOW (ref 98–111)
Creatinine, Ser: 0.72 mg/dL (ref 0.44–1.00)
GFR, Estimated: >60 mL/min (ref >60)
Glucose, Bld: 103 mg/dL — ABNORMAL HIGH (ref 70–99)
Potassium: 3.7 mmol/L (ref 3.5–5.1)
Sodium: 125 mmol/L — ABNORMAL LOW (ref 135–145)
Total Bilirubin: 0.6 mg/dL (ref 0.0–1.2)
Total Protein: 6 g/dL — ABNORMAL LOW (ref 6.5–8.1)

## 2024-01-17 LAB — SAMPLE TO BLOOD BANK

## 2024-01-17 LAB — CBC WITH DIFFERENTIAL/PLATELET
HCT: 23.6 % — ABNORMAL LOW (ref 36.0–46.0)
Hemoglobin: 8 g/dL — ABNORMAL LOW (ref 12.0–15.0)
MCH: 31.6 pg (ref 26.0–34.0)
MCHC: 33.9 g/dL (ref 30.0–36.0)
MCV: 93.3 fL (ref 80.0–100.0)
Platelets: 116 10*3/uL — ABNORMAL LOW (ref 150–400)
RBC: 2.53 MIL/uL — ABNORMAL LOW (ref 3.87–5.11)
RDW: 14.7 % (ref 11.5–15.5)
Smear Review: DECREASED
WBC: 0.2 10*3/uL — CL (ref 4.0–10.5)
nRBC: 0 % (ref 0.0–0.2)

## 2024-01-17 LAB — PROTIME-INR
INR: 1 (ref 0.8–1.2)
Prothrombin Time: 13 s (ref 11.4–15.2)

## 2024-01-17 LAB — RESP PANEL BY RT-PCR (RSV, FLU A&B, COVID)  RVPGX2
Influenza A by PCR: NEGATIVE
Influenza B by PCR: NEGATIVE
Resp Syncytial Virus by PCR: NEGATIVE
SARS Coronavirus 2 by RT PCR: NEGATIVE

## 2024-01-17 LAB — I-STAT CG4 LACTIC ACID, ED: Lactic Acid, Venous: 3.2 mmol/L (ref 0.5–1.9)

## 2024-01-17 LAB — LACTIC ACID, PLASMA: Lactic Acid, Venous: 1.6 mmol/L (ref 0.5–1.9)

## 2024-01-17 MED ORDER — ONDANSETRON HCL 4 MG/2ML IJ SOLN
4.0000 mg | Freq: Four times a day (QID) | INTRAMUSCULAR | Status: DC | PRN
  Administered 2024-01-17 – 2024-01-21 (×4): 4 mg via INTRAVENOUS
  Filled 2024-01-17 (×5): qty 2

## 2024-01-17 MED ORDER — METRONIDAZOLE 500 MG/100ML IV SOLN
500.0000 mg | Freq: Once | INTRAVENOUS | Status: AC
  Administered 2024-01-17: 500 mg via INTRAVENOUS
  Filled 2024-01-17: qty 100

## 2024-01-17 MED ORDER — ONDANSETRON HCL 4 MG PO TABS
4.0000 mg | ORAL_TABLET | Freq: Four times a day (QID) | ORAL | Status: DC | PRN
  Administered 2024-01-19: 4 mg via ORAL
  Filled 2024-01-17: qty 1

## 2024-01-17 MED ORDER — LACTATED RINGERS IV SOLN: INTRAVENOUS | Status: AC

## 2024-01-17 MED ORDER — FILGRASTIM-SNDZ 300 MCG/0.5ML IJ SOSY
300.0000 ug | PREFILLED_SYRINGE | Freq: Once | INTRAMUSCULAR | Status: AC
  Administered 2024-01-17: 300 ug via SUBCUTANEOUS
  Filled 2024-01-17: qty 0.5

## 2024-01-17 MED ORDER — ENSURE ENLIVE PO LIQD
237.0000 mL | Freq: Three times a day (TID) | ORAL | Status: DC
  Administered 2024-01-17 – 2024-01-24 (×9): 237 mL via ORAL

## 2024-01-17 MED ORDER — SIMVASTATIN 20 MG PO TABS
20.0000 mg | ORAL_TABLET | Freq: Every day | ORAL | Status: DC
Start: 2024-01-17 — End: 2024-01-27
  Administered 2024-01-17 – 2024-01-24 (×6): 20 mg via ORAL
  Filled 2024-01-17 (×8): qty 1

## 2024-01-17 MED ORDER — LEVOTHYROXINE SODIUM 50 MCG PO TABS
50.0000 ug | ORAL_TABLET | Freq: Every day | ORAL | Status: DC
  Administered 2024-01-18 – 2024-01-25 (×6): 50 ug via ORAL
  Filled 2024-01-17 (×7): qty 1

## 2024-01-17 MED ORDER — ACETAMINOPHEN 650 MG RE SUPP
650.0000 mg | Freq: Four times a day (QID) | RECTAL | Status: DC | PRN
  Administered 2024-01-25 – 2024-01-26 (×2): 650 mg via RECTAL
  Filled 2024-01-17 (×2): qty 1

## 2024-01-17 MED ORDER — ENOXAPARIN SODIUM 40 MG/0.4ML IJ SOSY
40.0000 mg | PREFILLED_SYRINGE | INTRAMUSCULAR | Status: DC
  Administered 2024-01-17 – 2024-01-19 (×3): 40 mg via SUBCUTANEOUS
  Filled 2024-01-17 (×3): qty 0.4

## 2024-01-17 MED ORDER — LACTATED RINGERS IV BOLUS
1000.0000 mL | Freq: Once | INTRAVENOUS | Status: AC
  Administered 2024-01-17: 1000 mL via INTRAVENOUS

## 2024-01-17 MED ORDER — ALBUTEROL SULFATE (2.5 MG/3ML) 0.083% IN NEBU
2.5000 mg | INHALATION_SOLUTION | RESPIRATORY_TRACT | Status: DC | PRN
  Administered 2024-01-25: 2.5 mg via RESPIRATORY_TRACT
  Filled 2024-01-17: qty 3

## 2024-01-17 MED ORDER — ASPIRIN 81 MG PO TBEC
81.0000 mg | DELAYED_RELEASE_TABLET | Freq: Every day | ORAL | Status: DC
Start: 2024-01-18 — End: 2024-01-25
  Administered 2024-01-18 – 2024-01-21 (×4): 81 mg via ORAL
  Filled 2024-01-17 (×4): qty 1

## 2024-01-17 MED ORDER — ORAL CARE MOUTH RINSE: 15.0000 mL | OROMUCOSAL | Status: DC | PRN

## 2024-01-17 MED ORDER — PANTOPRAZOLE SODIUM 40 MG PO TBEC
40.0000 mg | DELAYED_RELEASE_TABLET | Freq: Every day | ORAL | Status: DC
  Administered 2024-01-18 – 2024-01-25 (×7): 40 mg via ORAL
  Filled 2024-01-17 (×8): qty 1

## 2024-01-17 MED ORDER — ACETAMINOPHEN 325 MG PO TABS
650.0000 mg | ORAL_TABLET | Freq: Four times a day (QID) | ORAL | Status: DC | PRN
  Administered 2024-01-17 – 2024-01-24 (×7): 650 mg via ORAL
  Filled 2024-01-17 (×7): qty 2

## 2024-01-17 MED ORDER — VANCOMYCIN HCL IN DEXTROSE 1-5 GM/200ML-% IV SOLN
1000.0000 mg | Freq: Once | INTRAVENOUS | Status: AC
  Administered 2024-01-17: 1000 mg via INTRAVENOUS
  Filled 2024-01-17: qty 200

## 2024-01-17 MED ORDER — SODIUM CHLORIDE 0.9 % IV SOLN
2.0000 g | Freq: Once | INTRAVENOUS | Status: AC
  Administered 2024-01-17: 2 g via INTRAVENOUS
  Filled 2024-01-17: qty 12.5

## 2024-01-17 NOTE — Telephone Encounter (Signed)
 CRITICAL VALUE STICKER  CRITICAL VALUE:WBC=0.3, ANC-0.0  RECEIVER (on-site recipient of call):   DATE & TIME NOTIFIED: 01/17/2024 1215  MESSENGER (representative from lab):  MD NOTIFIED: Mohamed  TIME OF NOTIFICATION: 1215  RESPONSE:  Zarxio  given today . Needs 2 more days of zarxio . Pt transsported to ED.

## 2024-01-17 NOTE — ED Provider Triage Note (Signed)
 Emergency Medicine Provider Triage Evaluation Note  Amanda Potter , a 78 y.o. female  was evaluated in triage.  Pt complains of cough, congestion, and fevers. Sick contact with friend diagnosed with viral URI earlier this week. Cough worsening over the last 2-3 days. States cough is productive with discolored phlegm. Increasing weakness. No recent falls or increased confusion.  Patient had labs performed earlier at cancer center and advised to present for workup for neutropenic fever/sepsis. Also notably hyponatremic and hypochloremic from labs this morning.  Review of Systems  Positive: As above Negative: As above  Physical Exam  BP 124/77 (BP Location: Left Arm)   Pulse (!) 104   Temp 99.8 F (37.7 C) (Oral)   Resp 16   Ht 5\' 6"  (1.676 m)   Wt 63.5 kg   SpO2 98%   BMI 22.60 kg/m  Gen:   Awake, no distress   Resp:  Increased work of breathing, right sides rales  MSK:   Moves extremities without difficulty  Other:    Medical Decision Making  Medically screening exam initiated at 1:09 PM.  Appropriate orders placed.  Amanda Potter was informed that the remainder of the evaluation will be completed by another provider, this initial triage assessment does not replace that evaluation, and the importance of remaining in the ED until their evaluation is complete.  WBC from earlier today at 0.3. Concern for neutropenic fever. Advised triage RN to prioritize patient for room placement.   Amanda Potter A, PA-C 01/17/24 1313

## 2024-01-17 NOTE — ED Triage Notes (Signed)
 Pt c/o cough, runny nose, ear pain, and body aches for 1x week.  Hx lung cx, recent chemo, Neutropenic.  AOx4

## 2024-01-17 NOTE — ED Provider Notes (Signed)
 [place] [facility] AT [facility] Provider Note   CSN: [id number] Arrival date & time: 01/17/24  1228     History  Chief Complaint  Patient presents with   URI    Amanda Potter is a 78 y.o. female with past ministry significant for stage IV adenocarcinoma of the lung, history of neutropenic fever, chemotherapy-induced neutropenia, hyponatremia.  Presenting with chills, body aches, worsening cough and congestion found to be severely neutropenic in the ED and met SIRS criteria.  Patient received chemotherapy and immunotherapy on 01/10/2023.  Was feeling her normal self following day.  However developed congestion on Saturday.  Then developed significant body aches, chills and cough on Monday.  Labs were obtained by her infusion center today, and found to be neutropenic-advised to go to ED.  Chemotherapy: Carboplatin  and Alimta.  Keytruda  was held secondary to elevated liver enzymes.  Admitted in February with neutropenic fever and had right-sided ligated for effusion (s/p thoracentesis).  Additionally severe hyponatremia down to 117, after IV fluid challenge with normal saline-she was transferred to the ICU.  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  She did not follow-up with nephrology, she is not currently on total tablets.  Was previously taking methimazole  for hyperthyroidism, no longer taking methimazole .  She had she started Synthroid  this past Tuesday.  In February had right-sided loculated pleural effusion, had thoracentesis, was improving.  Denies chest pain, shortness of breath, NVD, abdominal pain, dysuria, wounds.     Home Medications Prior to Admission medications   Medication Sig Start Date End Date Taking? Authorizing Provider  albuterol  (VENTOLIN  HFA) 108 (90 Base) MCG/ACT inhaler Inhale 1 puff into the lungs every 6 (six) hours as needed for shortness of  breath. 09/12/23 03/10/24  [provider]  aspirin  81 MG tablet Take 81 mg by mouth daily at 12 noon.     [provider]  azithromycin  (ZITHROMAX  Z-PAK) 250 MG tablet Take as directed 01/03/24   Heilingoetter, Cassandra L, PA-C  diphenhydrAMINE  (BENADRYL ) 25 MG tablet Take 25 mg by mouth daily as needed for allergies.    [provider]  Ferrous Sulfate (SLOW FE PO) Take 1 tablet by mouth daily.    [provider]  folic acid  (FOLVITE ) 1 MG tablet Take 1 tablet (1 mg total) by mouth daily. 10/23/23   Heilingoetter, Cassandra L, PA-C  levothyroxine  (SYNTHROID ) 50 MCG tablet Take 1 tablet (50 mcg total) by mouth daily before breakfast. 01/10/24   Marlene Simas, MD  methimazole  (TAPAZOLE ) 10 MG tablet Take 2 tablets (20 mg total) by mouth daily. 10/04/23 01/02/24  [place], Rema Care, DO  methylPREDNISolone  (MEDROL  DOSEPAK) 4 MG TBPK tablet Use as instructed 01/03/24   Heilingoetter, Cassandra L, PA-C  omeprazole (PRILOSEC) 20 MG capsule Take 20 mg by mouth daily.    [provider]  potassium chloride  SA (KLOR-CON  M) 20 MEQ tablet Take 1 tablet (20 mEq total) by mouth 2 (two) times daily. 12/28/23   Boscia, Heather E, NP  potassium chloride  SA (KLOR-CON  M) 20 MEQ tablet Take 1 tablet (20 mEq total) by mouth 2 (two) times daily. 01/03/24   Heilingoetter, Cassandra L, PA-C  prochlorperazine  (COMPAZINE ) 10 MG tablet Take 1 tablet (10 mg total) by mouth every 6 (six) hours as needed. Patient taking differently: Take 10 mg by mouth every 6 (six) hours as needed for nausea. 10/23/23   Heilingoetter, Cassandra L,  PA-C  simvastatin  (ZOCOR ) 20 MG tablet Take 20 mg by mouth at bedtime.     [provider]  Vitamin D, Ergocalciferol, (DRISDOL) 1.25 MG (50000 UNIT) CAPS capsule Take 50,000 Units by mouth once a week. 10/11/23   [provider]      Allergies    Latex and Tape    Review of Systems   Review of Systems  Constitutional:  Positive for chills,  diaphoresis and fatigue.  HENT:  Positive for congestion.   Respiratory:  Positive for cough and wheezing. Negative for apnea, chest tightness and shortness of breath.   Cardiovascular:  Negative for chest pain.  Gastrointestinal:  Positive for constipation. Negative for abdominal pain, diarrhea, nausea and vomiting.  Genitourinary:  Negative for difficulty urinating, dysuria and flank pain.  Musculoskeletal:  Positive for myalgias. Negative for arthralgias.  Skin:  Negative for rash and wound.    Physical Exam Updated Vital Signs BP 124/77 (BP Location: Left Arm)   Pulse (!) 104   Temp 99.8 F (37.7 C) (Oral)   Resp 16   Ht 5\' 6"  (1.676 m)   Wt 63.5 kg   SpO2 98%   BMI 22.60 kg/m  Physical Exam Constitutional:      General: She is not in acute distress.    Appearance: Normal appearance. She is ill-appearing.  HENT:     Head: Normocephalic and atraumatic.  Eyes:     Extraocular Movements: Extraocular movements intact.     Conjunctiva/sclera: Conjunctivae normal.     Pupils: Pupils are equal, round, and reactive to light.  Cardiovascular:     Rate and Rhythm: Normal rate and regular rhythm.     Pulses: Normal pulses.     Heart sounds: Normal heart sounds.  Pulmonary:     Effort: Pulmonary effort is normal. No respiratory distress.     Breath sounds: No stridor. Rhonchi present.     Comments: Coarse breath sounds bilaterally Abdominal:     General: Abdomen is flat. Bowel sounds are normal. There is no distension.     Palpations: Abdomen is soft.     Tenderness: There is no abdominal tenderness. There is no guarding.  Musculoskeletal:     Cervical back: Normal range of motion.  Skin:    General: Skin is warm and dry.     Capillary Refill: Capillary refill takes 2 to 3 seconds.  Neurological:     General: No focal deficit present.     Mental Status: She is alert and oriented to person, place, and time.     ED Results / Procedures / Treatments   Labs (all labs  ordered are listed, but only abnormal results are displayed) Labs Reviewed  I-STAT CG4 LACTIC ACID, ED - Abnormal; Notable for the following components:      Result Value   Lactic Acid, Venous 3.2 (*)    All other components within normal limits  RESP PANEL BY RT-PCR (RSV, FLU A&B, COVID)  RVPGX2  CULTURE, BLOOD (ROUTINE X 2)  CULTURE, BLOOD (ROUTINE X 2)  CBC WITH DIFFERENTIAL/PLATELET  COMPREHENSIVE METABOLIC PANEL WITH GFR  PROTIME-INR  URINALYSIS, W/ REFLEX TO CULTURE (INFECTION SUSPECTED)  I-STAT CG4 LACTIC ACID, ED    EKG None  Radiology DG Chest Portable 1 View Result Date: 01/17/2024 CLINICAL DATA:  Cough and fever EXAM: PORTABLE CHEST 1 VIEW COMPARISON:  November 13, 2023 FINDINGS: Chronic bronchial changes of the right lower lobe and left lower lobe retrocardiac atelectasis. No consolidations No pleural effusions. Heart  and mediastinum normal IMPRESSION: Chronic bronchial changes of the right lower lobe and left lower lobe retrocardiac atelectasis. Electronically Signed   By: Fredrich Jefferson M.D.   On: 01/17/2024 14:28    Procedures Procedures    Medications Ordered in ED Medications  ceFEPIme  (MAXIPIME ) 2 g in sodium chloride  0.9 % 100 mL IVPB (has no administration in time range)  metroNIDAZOLE  (FLAGYL ) IVPB 500 mg (has no administration in time range)  vancomycin  (VANCOCIN ) IVPB 1000 mg/200 mL premix (has no administration in time range)  lactated ringers  bolus 1,000 mL (1,000 mLs Intravenous New Bag/Given 01/17/24 1455)    ED Course/ Medical Decision Making/ A&P                                 Medical Decision Making 78 year old female with history of adenocarcinoma (currently on chemotherapy/immunotherapy) presenting with systemic symptoms and found to meet SIRS criteria (tachycardic, leukopenia with neutropenia). Differential includes: Sepsis, neutropenic fever, viral illness.  Unclear source of sepsis. CODE Sepsis initiated, obtained lab work and initiated  fluids.  Broad-spectrum antibiotics after blood cultures were drawn.  Will obtain CXR.  Not currently in pain.  Independently reviewed imaginng: CXR without effusion, pulmonary congestion. Agree with official radiology read. Lactic acid 3.19.  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  This patient has a life-threatening condition requiring hospital admission for further workup and monitoring. Hemodynamically stable and appropriate for admission to progressive. Will page hospitalist for admission.  Spoke with [facility] hospitalist. They will admit patient.  Amount and/or Complexity of Data Reviewed Labs: ordered.  Risk Prescription drug management. Decision regarding hospitalization.         Final Clinical Impression(s) / ED Diagnoses Final diagnoses:  None    Rx / DC Orders ED Discharge Orders     None         Aliz, Meritt, DO 01/17/24 1536    Dorenda Gandy, MD 01/18/24 [phone]

## 2024-01-17 NOTE — Sepsis Progress Note (Signed)
 Notified bedside nurse of need to draw repeat lactic acid @ 1626.

## 2024-01-17 NOTE — Telephone Encounter (Signed)
 Pt reported UR symptoms. CBC reflects severe neutrapenia.  Zarixo injection given.  [facility] unable to see pt today  I cancelled PCP appt with Dr. Arvie Latus. Pt transported to ED via wheelchair. In no acute distress. Temp 98 and pt does report chills.

## 2024-01-17 NOTE — Sepsis Progress Note (Signed)
 Elink monitoring for the code sepsis protocol.

## 2024-01-17 NOTE — H&P (Signed)
 History and Physical  Amanda Potter ZOX:[id number] DOB: Dec 20, 1945 DOA: 01/17/2024  PCP: Lory Rough., PA-C   Chief Complaint: Weakness  HPI: Amanda Potter is a 78 y.o. female with medical history significant for stage IV non-small cell lung cancer, hypertension, hyperlipidemia on palliative systemic chemotherapy being admitted to the hospital with neutropenia and severe weakness, concerning for acute infection.  History is provided by the patient as well as her daughter who is at the bedside.  Daughter became concerned today because she noticed that her mother was very weak, had a cough, rattly sounding breath sounds.  Patient denies any rash on her skin, any nausea, vomiting, productive cough, abdominal pain, diarrhea, etc.  She did have a friend visit recently apparently had some sort of a viral URI.  Workup in emergency department reveals tachycardia, leukopenia, elevated lactic acid.  Blood cultures were obtained, she was started on empiric IV antibiotics.  Review of Systems: Please see HPI for pertinent positives and negatives. A complete 10 system review of systems are otherwise negative.  Past Medical History:  Diagnosis Date   Deviated nasal septum    Environmental allergies    GERD (gastroesophageal reflux disease)    Hyperlipidemia    Hypertension    Perforation of colon (HCC) 2006   Following colonoscopy   Sinusitis, acute    Past Surgical History:  Procedure Laterality Date   CHOLECYSTECTOMY     COLON SURGERY Right 2006   hemicolectomy   COLONOSCOPY WITH PROPOFOL  N/A 05/30/2019   Procedure: COLONOSCOPY WITH PROPOFOL ;  Surgeon: Alvis Jourdain, MD;  Location: [facility];  Service: Endoscopy;  Laterality: N/A;   ENTEROSCOPY N/A 05/30/2019   Procedure: ENTEROSCOPY;  Surgeon: Alvis Jourdain, MD;  Location: [facility];  Service: Endoscopy;  Laterality: N/A;   HERNIA REPAIR  [id number]   INCISIONAL HERNIA REPAIR N/A 03/03/2013   Procedure: LAPAROSCOPIC INCISIONAL HERNIA  REPAIR;  Surgeon: Harlee Lichtenstein, MD;  Location: [facility];  Service: General;  Laterality: N/A;  LYSIS OF ADHESIONS FOR 60 MINUTES   INSERTION OF MESH N/A 03/03/2013   Procedure: INSERTION OF MESH;  Surgeon: Harlee Lichtenstein, MD;  Location: [facility];  Service: General;  Laterality: N/A;   IR THORACENTESIS ASP PLEURAL SPACE W/IMG GUIDE  11/13/2023   POLYPECTOMY  05/30/2019   Procedure: POLYPECTOMY;  Surgeon: Alvis Jourdain, MD;  Location: [facility];  Service: Endoscopy;;   TONSILLECTOMY     TUBAL LIGATION     Social History:  reports that she has never smoked. She has never used smokeless tobacco. She reports that she does not drink alcohol and does not use drugs.  Allergies  Allergen Reactions   Latex Rash   Tape Rash    Glue on tape    Family History  Problem Relation Age of Onset   Hearing loss Mother    Diabetes Mother    Hyperlipidemia Mother    Hypertension Mother    Heart disease Father      Prior to Admission medications   Medication Sig Start Date End Date Taking? Authorizing Provider  albuterol  (VENTOLIN  HFA) 108 (90 Base) MCG/ACT inhaler Inhale 1 puff into the lungs every 6 (six) hours as needed for shortness of breath. 09/12/23 03/10/24  [provider]  aspirin  81 MG tablet Take 81 mg by mouth daily at 12 noon.     [provider]  azithromycin  (ZITHROMAX  Z-PAK) 250 MG tablet Take as directed 01/03/24   Heilingoetter, Cassandra L, PA-C  diphenhydrAMINE  (BENADRYL ) 25 MG tablet Take  25 mg by mouth daily as needed for allergies.    [provider]  Ferrous Sulfate (SLOW FE PO) Take 1 tablet by mouth daily.    [provider]  folic acid  (FOLVITE ) 1 MG tablet Take 1 tablet (1 mg total) by mouth daily. 10/23/23   Heilingoetter, Cassandra L, PA-C  levothyroxine  (SYNTHROID ) 50 MCG tablet Take 1 tablet (50 mcg total) by mouth daily before breakfast. 01/10/24   Marlene Simas, MD  methimazole  (TAPAZOLE ) 10 MG tablet Take 2 tablets (20 mg total) by  mouth daily. 10/04/23 01/02/24  [place], Rema Care, DO  methylPREDNISolone  (MEDROL  DOSEPAK) 4 MG TBPK tablet Use as instructed 01/03/24   Heilingoetter, Cassandra L, PA-C  omeprazole (PRILOSEC) 20 MG capsule Take 20 mg by mouth daily.    [provider]  potassium chloride  SA (KLOR-CON  M) 20 MEQ tablet Take 1 tablet (20 mEq total) by mouth 2 (two) times daily. 12/28/23   Boscia, Heather E, NP  potassium chloride  SA (KLOR-CON  M) 20 MEQ tablet Take 1 tablet (20 mEq total) by mouth 2 (two) times daily. 01/03/24   Heilingoetter, Cassandra L, PA-C  prochlorperazine  (COMPAZINE ) 10 MG tablet Take 1 tablet (10 mg total) by mouth every 6 (six) hours as needed. Patient taking differently: Take 10 mg by mouth every 6 (six) hours as needed for nausea. 10/23/23   Heilingoetter, Cassandra L, PA-C  simvastatin  (ZOCOR ) 20 MG tablet Take 20 mg by mouth at bedtime.     [provider]  Vitamin D, Ergocalciferol, (DRISDOL) 1.25 MG (50000 UNIT) CAPS capsule Take 50,000 Units by mouth once a week. 10/11/23   [provider]    Physical Exam: BP 124/77 (BP Location: Left Arm)   Pulse (!) 104   Temp 99.8 F (37.7 C) (Oral)   Resp 16   Ht 5\' 6"  (1.676 m)   Wt 63.5 kg   SpO2 98%   BMI 22.60 kg/m  General:  Alert, oriented, calm, in no acute distress, elderly woman appearing her stated age looks tired, but nontoxic.  Her daughter is at the bedside. Cardiovascular: RRR, no murmurs or rubs, no peripheral edema  Respiratory: clear to auscultation bilaterally, no wheezes, no crackles  Abdomen: soft, nontender, nondistended, normal bowel tones heard  Skin: dry, no rashes  Musculoskeletal: no joint effusions, normal range of motion  Psychiatric: appropriate affect, normal speech  Neurologic: extraocular muscles intact, clear speech, moving all extremities with intact sensorium         Labs on Admission:  Basic Metabolic Panel: Recent Labs  Lab 01/17/24 1049  NA 125*  K 3.9  CL 91*  CO2 30   GLUCOSE 130*  BUN 17  CREATININE 0.79  CALCIUM 8.4*   Liver Function Tests: Recent Labs  Lab 01/17/24 1049  AST 37  ALT 55*  ALKPHOS 745*  BILITOT 0.6  PROT 6.4*  ALBUMIN 3.1*   No results for input(s): "LIPASE", "AMYLASE" in the last 168 hours. No results for input(s): "AMMONIA" in the last 168 hours. CBC: Recent Labs  Lab 01/17/24 1049  WBC 0.3*  NEUTROABS 0.0*  HGB 8.6*  HCT 24.4*  MCV 88.7  PLT 147*   Cardiac Enzymes: No results for input(s): "CKTOTAL", "CKMB", "CKMBINDEX", "TROPONINI" in the last 168 hours. BNP (last 3 results) Recent Labs    09/28/23 0350 09/29/23 0247 09/30/23 0305  BNP 64.7 64.1 37.5    ProBNP (last 3 results) No results for input(s): "PROBNP" in the last 8760 hours.  CBG: No results  for input(s): "GLUCAP" in the last 168 hours.  Radiological Exams on Admission: DG Chest Portable 1 View Result Date: 01/17/2024 CLINICAL DATA:  Cough and fever EXAM: PORTABLE CHEST 1 VIEW COMPARISON:  November 13, 2023 FINDINGS: Chronic bronchial changes of the right lower lobe and left lower lobe retrocardiac atelectasis. No consolidations No pleural effusions. Heart and mediastinum normal IMPRESSION: Chronic bronchial changes of the right lower lobe and left lower lobe retrocardiac atelectasis. Electronically Signed   By: Fredrich Jefferson M.D.   On: 01/17/2024 14:28   Assessment/Plan Amanda Potter is a 78 y.o. female with medical history significant for stage IV non-small cell lung cancer, hypertension, hyperlipidemia on palliative systemic chemotherapy being admitted to the hospital with neutropenia and severe weakness, concerning for acute infection.    Sepsis-meeting criteria with tachycardia, neutropenia, lactic acid 3.2.  Infection is suspected given her neutropenia, however etiology/source of infection is unclear at this time. -Inpatient admission to progressive -Follow-up blood cultures -Trend lactate -Continue empiric broad-spectrum IV  antibiotics  Acute on chronic anemia-patient denies any history of blood loss, will monitor with daily labs, may expect some drop with recent chemotherapy.  If needed, would transfuse to keep hemoglobin greater than 7.  Hyponatremia-not clearly symptomatic, could be related to hypovolemia, or potentially SIADH though she does not appear to have an acute respiratory illness at this time.  Note that on her previous admission, she had severe hyponatremia requiring ICU admission, and salt tablets, etc. -Hydrate gently with LR -1800 cc fluid restriction -Monitor sodium levels with every 8 hours labs  Neutropenia-due to recent chemotherapy, will place on neutropenic precautions  Stage IV non-small cell lung cancer-receiving chemotherapy under the care of Dr. Marguerita Shih -Dr. Marguerita Shih added to inpatient treatment team  Hypothyroidism-she was initially placed on methimazole , but has discontinued this, will continue home dose Synthroid   Hyperlipidemia-Zocor   GERD-Protonix   DVT prophylaxis: Lovenox      Code Status: Full Code  Consults called: None  Admission status: The appropriate patient status for this patient is INPATIENT. Inpatient status is judged to be reasonable and necessary in order to provide the required intensity of service to ensure the patient's safety. The patient's presenting symptoms, physical exam findings, and initial radiographic and laboratory data in the context of their chronic comorbidities is felt to place them at high risk for further clinical deterioration. Furthermore, it is not anticipated that the patient will be medically stable for discharge from the hospital within 2 midnights of admission.    I certify that at the point of admission it is my clinical judgment that the patient will require inpatient hospital care spanning beyond 2 midnights from the point of admission due to high intensity of service, high risk for further deterioration and high frequency of  surveillance required  Time spent: 59 minutes  Nelson Julson Rickey Charm MD [facility] Pager [phone]  If 7PM-7AM, please contact night-coverage [email or web address] Password [facility]  01/17/2024, 3:14 PM

## 2024-01-17 NOTE — Telephone Encounter (Signed)
 Spoke with patients daughter, [name] this morning. She reports that starting Saturday, she has been on mucinex  for cough and bad allergies. Ever since Saturday, symptoms of "little rattle when breathing", runny nose, headaches, sneezing, and left ear pain (using ear drops), weak, and decreased appetite has aroused.  Daughter denies patient having trouble breathing and fever.  Patient has been around a friend that had a upper respiratory infection.   Patient has a lab appt today at 1045.  Spoke with [facility] nurse and [facility] is full today so recommended patient to be seen by PCP or urgent care today. [name] verbalized understanding and was told to call with any worsening symptoms.

## 2024-01-18 ENCOUNTER — Encounter: Payer: Self-pay | Admitting: Physician Assistant

## 2024-01-18 ENCOUNTER — Encounter (HOSPITAL_COMMUNITY): Payer: Self-pay | Admitting: Internal Medicine

## 2024-01-18 ENCOUNTER — Encounter: Payer: Self-pay | Admitting: Internal Medicine

## 2024-01-18 ENCOUNTER — Inpatient Hospital Stay

## 2024-01-18 ENCOUNTER — Telehealth: Payer: Self-pay

## 2024-01-18 DIAGNOSIS — E878 Other disorders of electrolyte and fluid balance, not elsewhere classified: Secondary | ICD-10-CM

## 2024-01-18 DIAGNOSIS — R6520 Severe sepsis without septic shock: Secondary | ICD-10-CM | POA: Diagnosis not present

## 2024-01-18 DIAGNOSIS — R7401 Elevation of levels of liver transaminase levels: Secondary | ICD-10-CM | POA: Diagnosis not present

## 2024-01-18 DIAGNOSIS — C3490 Malignant neoplasm of unspecified part of unspecified bronchus or lung: Secondary | ICD-10-CM | POA: Diagnosis not present

## 2024-01-18 DIAGNOSIS — A419 Sepsis, unspecified organism: Secondary | ICD-10-CM | POA: Diagnosis not present

## 2024-01-18 DIAGNOSIS — D709 Neutropenia, unspecified: Secondary | ICD-10-CM

## 2024-01-18 LAB — BLOOD CULTURE ID PANEL (REFLEXED) - BCID2

## 2024-01-18 LAB — CBC WITH DIFFERENTIAL/PLATELET
Abs Immature Granulocytes: 0 10*3/uL (ref 0.00–0.07)
Basophils Absolute: 0 10*3/uL (ref 0.0–0.1)
Basophils Relative: 0 %
Eosinophils Absolute: 0 10*3/uL (ref 0.0–0.5)
Eosinophils Relative: 0 %
HCT: 22.5 % — ABNORMAL LOW (ref 36.0–46.0)
Hemoglobin: 7.8 g/dL — ABNORMAL LOW (ref 12.0–15.0)
Immature Granulocytes: 0 %
Lymphocytes Relative: 90 %
Lymphs Abs: 0.3 10*3/uL — ABNORMAL LOW (ref 0.7–4.0)
MCH: 31.6 pg (ref 26.0–34.0)
MCHC: 34.7 g/dL (ref 30.0–36.0)
MCV: 91.1 fL (ref 80.0–100.0)
Monocytes Absolute: 0 10*3/uL — ABNORMAL LOW (ref 0.1–1.0)
Monocytes Relative: 7 %
Neutro Abs: 0 10*3/uL — CL (ref 1.7–7.7)
Neutrophils Relative %: 3 %
Platelets: 97 10*3/uL — ABNORMAL LOW (ref 150–400)
RBC: 2.47 MIL/uL — ABNORMAL LOW (ref 3.87–5.11)
RDW: 14.2 % (ref 11.5–15.5)
WBC: 0.3 10*3/uL — CL (ref 4.0–10.5)
nRBC: 0 % (ref 0.0–0.2)

## 2024-01-18 LAB — URINALYSIS, W/ REFLEX TO CULTURE (INFECTION SUSPECTED)
Bilirubin Urine: NEGATIVE
Glucose, UA: NEGATIVE mg/dL
Hgb urine dipstick: NEGATIVE
Ketones, ur: NEGATIVE mg/dL
Leukocytes,Ua: NEGATIVE
Nitrite: NEGATIVE
Protein, ur: 30 mg/dL — AB
Specific Gravity, Urine: 1.019 (ref 1.005–1.030)
pH: 5 (ref 5.0–8.0)

## 2024-01-18 LAB — COMPREHENSIVE METABOLIC PANEL WITH GFR
ALT: 46 U/L — ABNORMAL HIGH (ref 0–44)
AST: 43 U/L — ABNORMAL HIGH (ref 15–41)
Albumin: 2.4 g/dL — ABNORMAL LOW (ref 3.5–5.0)
Alkaline Phosphatase: 511 U/L — ABNORMAL HIGH (ref 38–126)
Anion gap: 11 (ref 5–15)
BUN: 17 mg/dL (ref 8–23)
CO2: 21 mmol/L — ABNORMAL LOW (ref 22–32)
Calcium: 7.9 mg/dL — ABNORMAL LOW (ref 8.9–10.3)
Chloride: 90 mmol/L — ABNORMAL LOW (ref 98–111)
Creatinine, Ser: 0.78 mg/dL (ref 0.44–1.00)
GFR, Estimated: >60 mL/min (ref >60)
Glucose, Bld: 97 mg/dL (ref 70–99)
Potassium: 3.7 mmol/L (ref 3.5–5.1)
Sodium: 122 mmol/L — ABNORMAL LOW (ref 135–145)
Total Bilirubin: 1.1 mg/dL (ref 0.0–1.2)
Total Protein: 5.9 g/dL — ABNORMAL LOW (ref 6.5–8.1)

## 2024-01-18 LAB — BASIC METABOLIC PANEL WITH GFR
Anion gap: 11 (ref 5–15)
Anion gap: 9 (ref 5–15)
BUN: 17 mg/dL (ref 8–23)
BUN: 16 mg/dL (ref 8–23)
CO2: 23 mmol/L (ref 22–32)
CO2: 23 mmol/L (ref 22–32)
Calcium: 8.3 mg/dL — ABNORMAL LOW (ref 8.9–10.3)
Calcium: 7.8 mg/dL — ABNORMAL LOW (ref 8.9–10.3)
Chloride: 90 mmol/L — ABNORMAL LOW (ref 98–111)
Chloride: 92 mmol/L — ABNORMAL LOW (ref 98–111)
Creatinine, Ser: 0.65 mg/dL (ref 0.44–1.00)
Creatinine, Ser: 0.74 mg/dL (ref 0.44–1.00)
GFR, Estimated: >60 mL/min (ref >60)
GFR, Estimated: >60 mL/min (ref >60)
Glucose, Bld: 89 mg/dL (ref 70–99)
Glucose, Bld: 104 mg/dL — ABNORMAL HIGH (ref 70–99)
Potassium: 3.2 mmol/L — ABNORMAL LOW (ref 3.5–5.1)
Potassium: 3.1 mmol/L — ABNORMAL LOW (ref 3.5–5.1)
Sodium: 124 mmol/L — ABNORMAL LOW (ref 135–145)
Sodium: 124 mmol/L — ABNORMAL LOW (ref 135–145)

## 2024-01-18 LAB — CBC
HCT: 27.2 % — ABNORMAL LOW (ref 36.0–46.0)
Hemoglobin: 8.6 g/dL — ABNORMAL LOW (ref 12.0–15.0)
MCH: 31.6 pg (ref 26.0–34.0)
MCHC: 31.6 g/dL (ref 30.0–36.0)
MCV: 100 fL (ref 80.0–100.0)
Platelets: 98 10*3/uL — ABNORMAL LOW (ref 150–400)
RBC: 2.72 MIL/uL — ABNORMAL LOW (ref 3.87–5.11)
RDW: 14.7 % (ref 11.5–15.5)
WBC: 0.3 10*3/uL — CL (ref 4.0–10.5)
nRBC: 6.3 % — ABNORMAL HIGH (ref 0.0–0.2)

## 2024-01-18 LAB — MRSA NEXT GEN BY PCR, NASAL: MRSA by PCR Next Gen: NOT DETECTED

## 2024-01-18 MED ORDER — FOLIC ACID 1 MG PO TABS
1.0000 mg | ORAL_TABLET | Freq: Every day | ORAL | Status: DC
  Administered 2024-01-19 – 2024-01-25 (×6): 1 mg via ORAL
  Filled 2024-01-18 (×7): qty 1

## 2024-01-18 MED ORDER — VANCOMYCIN HCL IN DEXTROSE 1-5 GM/200ML-% IV SOLN
1000.0000 mg | INTRAVENOUS | Status: DC
  Administered 2024-01-18 – 2024-01-21 (×4): 1000 mg via INTRAVENOUS
  Filled 2024-01-18 (×4): qty 200

## 2024-01-18 MED ORDER — GUAIFENESIN-DM 100-10 MG/5ML PO SYRP
10.0000 mL | ORAL_SOLUTION | ORAL | Status: DC | PRN
  Administered 2024-01-19 – 2024-01-22 (×4): 10 mL via ORAL
  Filled 2024-01-18 (×6): qty 10

## 2024-01-18 MED ORDER — POTASSIUM CHLORIDE CRYS ER 20 MEQ PO TBCR
40.0000 meq | EXTENDED_RELEASE_TABLET | Freq: Once | ORAL | Status: AC
  Administered 2024-01-18: 40 meq via ORAL
  Filled 2024-01-18: qty 2

## 2024-01-18 MED ORDER — VITAMIN B-12 1000 MCG PO TABS
1000.0000 ug | ORAL_TABLET | Freq: Every day | ORAL | Status: DC
  Administered 2024-01-19 – 2024-01-25 (×6): 1000 ug via ORAL
  Filled 2024-01-18 (×7): qty 1

## 2024-01-18 MED ORDER — SODIUM CHLORIDE 0.9 % IV SOLN
2.0000 g | Freq: Two times a day (BID) | INTRAVENOUS | Status: AC
  Administered 2024-01-18 – 2024-01-22 (×10): 2 g via INTRAVENOUS
  Filled 2024-01-18 (×10): qty 12.5

## 2024-01-18 MED ORDER — FILGRASTIM-AAFI 300 MCG/0.5ML IJ SOSY
300.0000 ug | PREFILLED_SYRINGE | Freq: Every day | INTRAMUSCULAR | Status: AC
Start: 2024-01-18 — End: 2024-01-21
  Administered 2024-01-18 – 2024-01-20 (×3): 300 ug via SUBCUTANEOUS
  Filled 2024-01-18 (×3): qty 0.5

## 2024-01-18 MED ORDER — METRONIDAZOLE 500 MG/100ML IV SOLN
500.0000 mg | Freq: Two times a day (BID) | INTRAVENOUS | Status: DC
  Administered 2024-01-18 – 2024-01-21 (×7): 500 mg via INTRAVENOUS
  Filled 2024-01-18 (×7): qty 100

## 2024-01-18 MED ORDER — POTASSIUM CHLORIDE CRYS ER 20 MEQ PO TBCR
40.0000 meq | EXTENDED_RELEASE_TABLET | ORAL | Status: AC
Start: 2024-01-18 — End: 2024-01-18
  Administered 2024-01-18 (×2): 40 meq via ORAL
  Filled 2024-01-18 (×2): qty 2

## 2024-01-18 MED ORDER — LACTATED RINGERS IV SOLN: INTRAVENOUS | Status: DC

## 2024-01-18 MED ORDER — SODIUM CHLORIDE 1 G PO TABS
2.0000 g | ORAL_TABLET | Freq: Three times a day (TID) | ORAL | Status: DC
  Administered 2024-01-18 – 2024-01-21 (×11): 2 g via ORAL
  Filled 2024-01-18 (×11): qty 2

## 2024-01-18 NOTE — TOC Initial Note (Signed)
 Transition of Care [facility]) - Initial/Assessment Note   Patient Details  Name: Amanda Potter MRN: [id number] Date of Birth: 21-Oct-1945  Transition of [facility]) CM/SW Contact:    Zenon Hilda, LCSW Phone Number: 01/18/2024, 11:42 AM  Clinical Narrative: Patient is from home with family. Patient is being followed at the cancer center. Patient is currently on IV antibiotics. TOC following for possible discharge needs.  Expected Discharge Plan: Home/Self Care Barriers to Discharge: Continued Medical Work up  Expected Discharge Plan and Services In-house Referral: Clinical Social Work Living arrangements for the past 2 months: Single Family Home             DME Arranged: N/A DME Agency: NA  Prior Living Arrangements/Services Living arrangements for the past 2 months: Single Family Home Lives with:: Adult Children Patient language and need for interpreter reviewed:: Yes Do you feel safe going back to the place where you live?: Yes      Need for Family Participation in Patient Care: No (Comment) Care giver support system in place?: Yes (comment) Current home services: DME [facility]) Criminal Activity/Legal Involvement Pertinent to Current Situation/Hospitalization: No - Comment as needed  Activities of Daily Living ADL Screening (condition at time of admission) Independently performs ADLs?: Yes (appropriate for developmental age) Is the patient deaf or have difficulty hearing?: No Does the patient have difficulty seeing, even when wearing glasses/contacts?: No Does the patient have difficulty concentrating, remembering, or making decisions?: No  Emotional Assessment Orientation: : Oriented to Self, Oriented to Place, Oriented to  Time, Oriented to Situation Alcohol / Substance Use: Not Applicable Psych Involvement: No (comment)  Admission diagnosis:  Sepsis [facility]) [A41.9] Patient Active Problem List   Diagnosis Date Noted   Sepsis (HCC) 01/17/2024   Elevated LFTs 01/03/2024    Chemotherapy induced neutropenia (HCC) 12/19/2023   Neutropenic fever (HCC) 11/28/2023   Hyperthyroidism 11/28/2023   Dyslipidemia 11/28/2023   Hypomagnesemia 11/22/2023   Hyponatremia 11/22/2023   Stage IV adenocarcinoma of lung, right (HCC) 11/11/2023   Hypokalemia 11/11/2023   Lung cancer (HCC) 10/23/2023   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 [place] [address] Phone: [phone] Fax: [phone]  Social Drivers of Health (SDOH) Social History: SDOH Screenings   Food Insecurity: No Food Insecurity (01/17/2024)  Housing: Low Risk  (01/17/2024)  Transportation Needs: No Transportation Needs (01/17/2024)  Utilities: Not At Risk (01/17/2024)  Social Connections: Socially Isolated (01/17/2024)  Tobacco Use: Low Risk  (01/18/2024)   SDOH Interventions:    Readmission Risk Interventions    01/18/2024   11:34 AM 11/12/2023    3:00 PM  Readmission Risk Prevention Plan  Transportation Screening Complete Complete  PCP or Specialist Appt within 5-7 Days  Complete  Home Care Screening  Complete  Medication Review (RN CM)  Complete  HRI or Home Care Consult Complete   Social Work Consult for Recovery Care Planning/Counseling Complete   Palliative Care Screening Not Applicable   Medication Review Oceanographer) Complete

## 2024-01-18 NOTE — Progress Notes (Signed)
 PHARMACY - PHYSICIAN COMMUNICATION CRITICAL VALUE ALERT - BLOOD CULTURE IDENTIFICATION (BCID)  Amanda Potter is an 78 y.o. female who presented to [facility] on 01/17/2024 with a chief complaint of sepsis, febrile neutropenia.   Assessment: BCx: 1/4 bottles positive for GPC, BCID shows Staphylococcus species.   Last fever 100.5 on 4/24 PM, remains neutropenic with WBC 0.3 (ANC 0.0 on 4/24)  Name of physician (or Provider) Contacted: Dr. Bobbetta Burnet  Current antibiotics: Cefepime , Vancomycin , metronidazole   Changes to prescribed antibiotics recommended:  Patient is on recommended antibiotics - No changes needed May be contaminant, but continue current antibiotics for now and wait further culture data given fever and neutropenia.     Results for orders placed or performed during the hospital encounter of 01/17/24  Blood Culture ID Panel (Reflexed) (Collected: 01/17/2024  2:56 PM)  Result Value Ref Range   Enterococcus faecalis NOT DETECTED NOT DETECTED   Enterococcus Faecium NOT DETECTED NOT DETECTED   Listeria monocytogenes NOT DETECTED NOT DETECTED   Staphylococcus species DETECTED (A) NOT DETECTED   Staphylococcus aureus (BCID) NOT DETECTED NOT DETECTED   Staphylococcus epidermidis NOT DETECTED NOT DETECTED   Staphylococcus lugdunensis NOT DETECTED NOT DETECTED   Streptococcus species NOT DETECTED NOT DETECTED   Streptococcus agalactiae NOT DETECTED NOT DETECTED   Streptococcus pneumoniae NOT DETECTED NOT DETECTED   Streptococcus pyogenes NOT DETECTED NOT DETECTED   A.calcoaceticus-baumannii NOT DETECTED NOT DETECTED   Bacteroides fragilis NOT DETECTED NOT DETECTED   Enterobacterales NOT DETECTED NOT DETECTED   Enterobacter cloacae complex NOT DETECTED NOT DETECTED   Escherichia coli NOT DETECTED NOT DETECTED   Klebsiella aerogenes NOT DETECTED NOT DETECTED   Klebsiella oxytoca NOT DETECTED NOT DETECTED   Klebsiella pneumoniae NOT DETECTED NOT DETECTED   Proteus species NOT DETECTED  NOT DETECTED   Salmonella species NOT DETECTED NOT DETECTED   Serratia marcescens NOT DETECTED NOT DETECTED   Haemophilus influenzae NOT DETECTED NOT DETECTED   Neisseria meningitidis NOT DETECTED NOT DETECTED   Pseudomonas aeruginosa NOT DETECTED NOT DETECTED   Stenotrophomonas maltophilia NOT DETECTED NOT DETECTED   Candida albicans NOT DETECTED NOT DETECTED   Candida auris NOT DETECTED NOT DETECTED   Candida glabrata NOT DETECTED NOT DETECTED   Candida krusei NOT DETECTED NOT DETECTED   Candida parapsilosis NOT DETECTED NOT DETECTED   Candida tropicalis NOT DETECTED NOT DETECTED   Cryptococcus neoformans/gattii NOT DETECTED NOT DETECTED    Kendall Pauls PharmD, BCPS [facility] [phone] 01/18/2024 8:18 AM

## 2024-01-18 NOTE — Telephone Encounter (Signed)
 Spoke with patients daughter, [name] in regards to injection appts for today and Saturday.  She states that patient is admitted to the hospital.  Appts for injections will be canceled.  She asked if the hospital would give her the injections and informed her that I was not sure. She asked about the scan scheduled for next week and informed her the appt will be kept until further notice. Informed [name] to call with any other concerns or questions.

## 2024-01-18 NOTE — Hospital Course (Addendum)
 Brief Narrative/Hospital Course: 78 y.o. female with medical history significant for stage IV non-small cell lung cancer, hypertension, hyperlipidemia on palliative systemic chemotherapy being admitted to the hospital with neutropenia and severe weakness, concerning for acute infection.  Patient was very weak, was having cough and rattly sounding breath sounds and contact with a friend with viral URI so daughter brought the patient to the ED. In the ED: Found to have tachycardia, leukopenia, pancytopenia, hyponatremia 125 elevated lactic acid.Blood cultures sent placed on empiric antibiotics.  RSV COVID influenza negative Cxr> chronic bronchial changes RLL,LLL  Subjective: Seen and examined Patient reports he feels much better about 50% compared to admission Still having cough-at times breathing harder, Overnight patient remains afebrile BP stable on 2 L [facility] Labs this morning sodium trending up 127 potassium also 3.3 WBC 0.3 with ANC 0 hemoglobin 7.4  Assessment and plan:  Severe Sepsis POA Neutropenic fever Diarrhea: Patient with neutropenia on systemic palliative chemo for stage IV NSCLCA. Sepsis etiology not clear at this time>BCx: 1/4 bottles positive for GPC, BCID shows Staphylococcus species.  Question contamination versus true infection.  Continue broad-spectrum antibiotics with vancomycin , Flagyl , cefepime . Continue daily Granix  per heme-onc.  Patient having diarrhea checking C. difficile in the setting of neutropenia.   Hyponatremia: Likely multifactorial in the setting of hypovolemia, or potentially SIADH. Note that on her previous admission, she had severe hyponatremia requiring ICU admission, and salt tablets, etc. Sodium trending up slowly continue salt tablets, IV fluids and monitor Recent Labs  Lab 01/17/24 2312 01/18/24 0335 01/18/24 0728 01/18/24 1450 01/19/24 0034  NA 124* 122* 124* 124* 127*     Stage IV NSCLCA on chemotherapy under the care of Dr. Marguerita Shih: Dr.  Marguerita Shih /oncology team is following.  Palliative care has been consulted.She is full code   Pancytopenia Likely due to chemotherapy.  Continue Granix , monitor hemoglobin platelet WBC count transfuse if Hb less than 7 Recent Labs  Lab 01/18/24 0335 01/18/24 1257 01/19/24 0034  HGB 8.6* 7.8* 7.4*  HCT 27.2* 22.5* 22.1*  WBC 0.3* 0.3* 0.3*  PLT 98* 97* 75*    Hypothyroidism: initially placed on methimazole , but has discontinued this,will continue home dose Synthroid    Hyperlipidemia cont Zocor    GERD: Cont Protonix   Hypokalemia: Replace  HLD: Continue her statin  Goals of care: Patient is stage IV cancer elderly, full code, overall prognosis guarded She wishes to be full code.  Palliative care has been consulted.  Daughter has been updated 4/25

## 2024-01-18 NOTE — Plan of Care (Signed)

## 2024-01-18 NOTE — Telephone Encounter (Signed)
 Informed [name] that I received a message from Lisa Rouson, NP stating that orders for granix  are in.  [name] voiced thanks.

## 2024-01-18 NOTE — Progress Notes (Signed)
 Amanda Potter   DOB:1946/01/13   ZO#:[id number]      ASSESSMENT & PLAN:  Neutropenic fever - Likely due to recent chemotherapy received 01/10/2024 - WBC low 0.3 with ANC on 01/17/2024 0.0.  Pending today's differential.  Received Granix  300 yesterday 01/17/2024.  Ordered daily x 3 more days. - Patient with elevated temp within last 24 hours.  Most recent today 98.2. - Continue antibiotics as ordered - Continue to monitor fever curve - Continue to monitor CBC with differential  Non-small cell lung cancer History of malignant pleural effusion - Diagnosed January 2025 - Started on palliative chemotherapy with carboplatin , pemetrexed  and pembrolizumab , initiated on 10/30/2023. - Most recent chemotherapy cycle 3 with only carboplatin  and Alimta given 01/10/2024.  Pembrolizumab  held secondary to elevated liver enzymes. - Medical oncology/Dr. Marguerita Shih following.  Last seen in outpatient oncology office 01/10/2024.  Electrolyte imbalance - As evidenced by hyponatremia and hypokalemia.  Please replace electrolytes appropriately. - Continue to monitor BMP  Elevated LFTs - LFTs downtrending nicely.  Bilirubin WNL. - Continue to monitor hepatic function   Code Status Full  Subjective:  Patient seen sleeping comfortably in bed, awakens easily to verbal stimuli.  Noted productive cough.  Reports she has slight abdominal pain.  Patient is ill-appearing.  No other acute distress noted.  Objective:  Vitals:   01/18/24 0212 01/18/24 0530  BP: 130/76 131/69  Pulse: 98 (!) 105  Resp: 20 20  Temp: 99.8 F (37.7 C) 98.2 F (36.8 C)  SpO2: 94% 92%     Intake/Output Summary (Last 24 hours) at 01/18/2024 1025 Last data filed at 01/18/2024 9811 Gross per 24 hour  Intake 3338.77 ml  Output --  Net 3338.77 ml     PHYSICAL EXAMINATION: ECOG PERFORMANCE STATUS: 3 - Symptomatic, >50% confined to bed  Vitals:   01/18/24 0212 01/18/24 0530  BP: 130/76 131/69  Pulse: 98 (!) 105  Resp: 20 20  Temp:  99.8 F (37.7 C) 98.2 F (36.8 C)  SpO2: 94% 92%   Filed Weights   01/17/24 1232  Weight: 140 lb (63.5 kg)    GENERAL: alert, + mild distress SKIN: skin color, texture, turgor are normal, no rashes or significant lesions EYES: normal, conjunctiva are pink and non-injected, sclera clear OROPHARYNX: no exudate, no erythema and lips, buccal mucosa, and tongue normal  NECK: supple, thyroid  normal size, non-tender, without nodularity LYMPH: no palpable lymphadenopathy in the cervical, axillary or inguinal LUNGS: + Rales to auscultation, + cough HEART: regular rate & rhythm and no murmurs and no lower extremity edema ABDOMEN: abdomen soft, non-tender and normal bowel sounds MUSCULOSKELETAL: no cyanosis of digits and no clubbing  PSYCH: alert & oriented x 3 with fluent speech NEURO: no focal motor/sensory deficits   All questions were answered. The patient knows to call the clinic with any problems, questions or concerns.   The total time spent in the appointment was 40 minutes encounter with patient including review of chart and various tests results, discussions about plan of care and coordination of care plan  Jacqualin Mate, NP 01/18/2024 10:25 AM    Labs Reviewed:  Lab Results  Component Value Date   WBC 0.3 (LL) 01/18/2024   HGB 8.6 (L) 01/18/2024   HCT 27.2 (L) 01/18/2024   MCV 100.0 01/18/2024   PLT 98 (L) 01/18/2024   Recent Labs    01/17/24 1049 01/17/24 1628 01/17/24 2312 01/18/24 0335 01/18/24 0728  NA 125* 125* 124* 122* 124*  K 3.9 3.7 3.2* 3.7  3.2*  CL 91* 92* 92* 90* 90*  CO2 30 21* 23 21* 23  GLUCOSE 130* 103* 125* 97 89  BUN 17 19 18 17 17   CREATININE 0.79 0.72 0.74 0.78 0.65  CALCIUM 8.4* 8.0* 8.0* 7.9* 8.3*  GFRNONAA >60 >60 >60 >60 >60  PROT 6.4* 6.0*  --  5.9*  --   ALBUMIN 3.1* 2.6*  --  2.4*  --   AST 37 37  --  43*  --   ALT 55* 49*  --  46*  --   ALKPHOS 745* 589*  --  511*  --   BILITOT 0.6 0.6  --  1.1  --     Studies Reviewed:  DG  Chest Portable 1 View Result Date: 01/17/2024 CLINICAL DATA:  Cough and fever EXAM: PORTABLE CHEST 1 VIEW COMPARISON:  November 13, 2023 FINDINGS: Chronic bronchial changes of the right lower lobe and left lower lobe retrocardiac atelectasis. No consolidations No pleural effusions. Heart and mediastinum normal IMPRESSION: Chronic bronchial changes of the right lower lobe and left lower lobe retrocardiac atelectasis. Electronically Signed   By: Fredrich Jefferson M.D.   On: 01/17/2024 14:28

## 2024-01-18 NOTE — Progress Notes (Signed)
 I messaged on call provider Kermit Ped NP to let her know patient had been having fevers even with Tylenol  and ice packs. Patient received one dose of cefepime  and vancomycin  today but it was one time doses. I ask her to review the notes and order antibiotics for patient. See MAR.

## 2024-01-18 NOTE — Progress Notes (Signed)
 PROGRESS NOTE Amanda Potter  LKG:[id number] DOB: 13-Dec-1945 DOA: 01/17/2024 PCP: Lory Rough., PA-C  Brief Narrative/Hospital Course: 78 y.o. female with medical history significant for stage IV non-small cell lung cancer, hypertension, hyperlipidemia on palliative systemic chemotherapy being admitted to the hospital with neutropenia and severe weakness, concerning for acute infection.  Patient was very weak, was having cough and rattly sounding breath sounds and contact with a friend with viral URI so daughter brought the patient to the ED. In the ED: Found to have tachycardia, leukopenia, pancytopenia, hyponatremia 125 elevated lactic acid.Blood cultures sent placed on empiric antibiotics.  RSV COVID influenza negative Cxr> chronic bronchial changes RLL,LLL  Subjective: Seen this am C/o cough on RA Weak frail no other complaints Overnight low-grade temp 100.5, BP stable on room air Labs reviewed sodium 124>122>124, potassium 3.2 CBC-W/ pancytopenia more or less same  Assessment and plan:  Severe Sepsis POA Fever with neutropenia: Patient with neutropenia and systemic palliative chemo for stage IV NSCLCA.  Etiology not clear blood culture pending  Continue IV fluid support, broad-spectrum antibiotics Vanco/Flagyl /cefepime  and de-escalate as able.  Lactic acidosis resolved ADDED antitussive . Recent Labs  Lab 01/17/24 1049 01/17/24 1426 01/17/24 1628 01/18/24 0335  WBC 0.3*  --  0.2* 0.3*  LATICACIDVEN  --  3.2* 1.6  --      Hyponatremia: Likely multifactorial in the setting of hypovolemia, or potentially SIADH though she does not appear to have an acute respiratory illness at this time.  Note that on her previous admission, she had severe hyponatremia requiring ICU admission, and salt tablets, etc. Continue IV fluid hydration fluid restriction 1.8 L monitor sodium every 8 hours, add salt tablets Recent Labs  Lab 01/17/24 1049 01/17/24 1628 01/17/24 2312 01/18/24 0335  01/18/24 0728  NA 125* 125* 124* 122* 124*     Stage IV non-small cell lung cancer-receiving chemotherapy under the care of Dr. Marguerita Shih: Dr. Marguerita Shih added to inpatient treatment team.  Will benefit with palliative care evaluation at some point She is full code   Pancytopenia Continue to monitor counts transfuse as needed.  Keep on neutropenic precaution. CHECK ANC.  Oncology notified-Granix  ordered. Recent Labs  Lab 01/17/24 1049 01/17/24 1628 01/18/24 0335  HGB 8.6* 8.0* 8.6*  HCT 24.4* 23.6* 27.2*  WBC 0.3* 0.2* 0.3*  PLT 147* 116* 98*    Hypothyroidism: initially placed on methimazole , but has discontinued this,will continue home dose Synthroid    Hyperlipidemia cont Zocor    GERD: Cont Protonix   Hypokalemia: Replace  HLD: Continue her statin  Goals of care: Patient is stage IV cancer elderly, full code, overall prognosis guarded She wishes to be full code  DVT prophylaxis: enoxaparin  (LOVENOX ) injection 40 mg Start: 01/17/24 2200 Code Status:   Code Status: Full Code Family Communication: plan of care discussed with patient/RN at bedside. Patient status is: Remains hospitalized because of severity of illness Level of care: Progressive   Dispo: The patient is from: home w/ son'            Anticipated disposition: TBD Objective: Vitals last 24 hrs: Vitals:   01/17/24 2208 01/18/24 0212 01/18/24 0530 01/18/24 1035  BP: 126/65 130/76 131/69 128/69  Pulse: (!) 113 98 (!) 105 99  Resp: 20 20 20 14   Temp: 99.8 F (37.7 C) 99.8 F (37.7 C) 98.2 F (36.8 C) 99.7 F (37.6 C)  TempSrc: Oral Oral Oral Oral  SpO2: 93% 94% 92% (!) 89%  Weight:      Height:  Physical Examination: General exam: alert awake, ill appearing, older than stated age HEENT:Oral mucosa moist, Ear/Nose WNL grossly Respiratory system: Bilaterally diminished BS, no use of accessory muscle Cardiovascular system: S1 & S2 +. Gastrointestinal system: Abdomen soft, mildly Tender  ,ND,BS+ Nervous System: Alert, awake,following commands. Extremities: LE edema neg, moving arms legs, warm legs Skin: No rashes,warm. MSK: Normal muscle bulk/tone.   Data Reviewed: I have personally reviewed following labs and imaging studies ( see epic result tab) CBC: Recent Labs  Lab 01/17/24 1049 01/17/24 1628 01/18/24 0335  WBC 0.3* 0.2* 0.3*  NEUTROABS 0.0*  --   --   HGB 8.6* 8.0* 8.6*  HCT 24.4* 23.6* 27.2*  MCV 88.7 93.3 100.0  PLT 147* 116* 98*   CMP: Recent Labs  Lab 01/17/24 1049 01/17/24 1628 01/17/24 2312 01/18/24 0335 01/18/24 0728  NA 125* 125* 124* 122* 124*  K 3.9 3.7 3.2* 3.7 3.2*  CL 91* 92* 92* 90* 90*  CO2 30 21* 23 21* 23  GLUCOSE 130* 103* 125* 97 89  BUN 17 19 18 17 17   CREATININE 0.79 0.72 0.74 0.78 0.65  CALCIUM 8.4* 8.0* 8.0* 7.9* 8.3*   GFR: Estimated Creatinine Clearance: 52.5 mL/min (by C-G formula based on SCr of 0.65 mg/dL). Recent Labs  Lab 01/17/24 1049 01/17/24 1628 01/18/24 0335  AST 37 37 43*  ALT 55* 49* 46*  ALKPHOS 745* 589* 511*  BILITOT 0.6 0.6 1.1  PROT 6.4* 6.0* 5.9*  ALBUMIN 3.1* 2.6* 2.4*   No results for input(s): "LIPASE", "AMYLASE" in the last 168 hours. No results for input(s): "AMMONIA" in the last 168 hours. Coagulation Profile:  Recent Labs  Lab 01/17/24 1628  INR 1.0   Medications reviewed:  Scheduled Meds:  aspirin  EC  81 mg Oral Q1200   enoxaparin  (LOVENOX ) injection  40 mg Subcutaneous Q24H   feeding supplement  237 mL Oral TID BM   filgrastim  (NIVESTYM ) SQ  300 mcg Subcutaneous Daily   levothyroxine   50 mcg Oral Q0600   pantoprazole   40 mg Oral Daily   simvastatin   20 mg Oral QHS   sodium chloride   2 g Oral TID WC   Continuous Infusions:  ceFEPime  (MAXIPIME ) IV 2 g (01/18/24 0218)   metronidazole  Stopped (01/18/24 1610)   vancomycin  Stopped (01/18/24 0438)    Total time spent in review of labs and imaging, patient evaluation, formulation of plan, documentation and communication with  patient/family: 50 minutes  Lesa Rape, MD [facility] 01/18/2024, 11:39 AM

## 2024-01-18 NOTE — Progress Notes (Signed)
 Pharmacy Antibiotic Note  Amanda Potter is a 78 y.o. female admitted on 01/17/2024 with sepsis.  Pharmacy has been consulted for Vancomycin  dosing.  Plan: Cefepime  2gm IV q12h Flagyl  500mg  IV q12h Vancomycin  1gm IV q24h to target AUC 400-550.   Estimated AUC on this regimen = 456 Monitor renal function and cx data  Check levels as needed   Height: 5\' 6"  (167.6 cm) Weight: 63.5 kg (140 lb) IBW/kg (Calculated) : 59.3  Temp (24hrs), Avg:99.7 F (37.6 C), Min:98.5 F (36.9 C), Max:100.5 F (38.1 C)  Recent Labs  Lab 01/17/24 1049 01/17/24 1426 01/17/24 1628 01/17/24 2312  WBC 0.3*  --  0.2*  --   CREATININE 0.79  --  0.72 0.74  LATICACIDVEN  --  3.2* 1.6  --     Estimated Creatinine Clearance: 52.5 mL/min (by C-G formula based on SCr of 0.74 mg/dL).    Allergies  Allergen Reactions   Latex Rash   Tape Rash    Glue on tape    Antimicrobials this admission: 4/24 Cefepime  >>  4/24 Vancomycin  >>  4/24  Metronidazole  >>  Dose adjustments this admission:  Microbiology results: 4/24 BCx:  4/24 Resp PCR: negative  Thank you for allowing pharmacy to be a part of this patient's care.  Arie Kurtz PharmD 01/18/2024 2:02 AM

## 2024-01-19 ENCOUNTER — Inpatient Hospital Stay

## 2024-01-19 DIAGNOSIS — Z79899 Other long term (current) drug therapy: Secondary | ICD-10-CM | POA: Diagnosis not present

## 2024-01-19 DIAGNOSIS — Z7189 Other specified counseling: Secondary | ICD-10-CM | POA: Diagnosis not present

## 2024-01-19 DIAGNOSIS — Z515 Encounter for palliative care: Secondary | ICD-10-CM | POA: Diagnosis not present

## 2024-01-19 DIAGNOSIS — A419 Sepsis, unspecified organism: Secondary | ICD-10-CM | POA: Diagnosis not present

## 2024-01-19 DIAGNOSIS — C3491 Malignant neoplasm of unspecified part of right bronchus or lung: Secondary | ICD-10-CM

## 2024-01-19 DIAGNOSIS — D701 Agranulocytosis secondary to cancer chemotherapy: Secondary | ICD-10-CM | POA: Diagnosis not present

## 2024-01-19 DIAGNOSIS — R6520 Severe sepsis without septic shock: Secondary | ICD-10-CM | POA: Diagnosis not present

## 2024-01-19 LAB — CBC WITH DIFFERENTIAL/PLATELET
Abs Immature Granulocytes: 0 10*3/uL (ref 0.00–0.07)
Basophils Absolute: 0 10*3/uL (ref 0.0–0.1)
Basophils Relative: 0 %
Eosinophils Absolute: 0 10*3/uL (ref 0.0–0.5)
Eosinophils Relative: 0 %
HCT: 22.1 % — ABNORMAL LOW (ref 36.0–46.0)
Hemoglobin: 7.4 g/dL — ABNORMAL LOW (ref 12.0–15.0)
Immature Granulocytes: 0 %
Lymphocytes Relative: 93 %
Lymphs Abs: 0.3 10*3/uL — ABNORMAL LOW (ref 0.7–4.0)
MCH: 31.2 pg (ref 26.0–34.0)
MCHC: 33.5 g/dL (ref 30.0–36.0)
MCV: 93.2 fL (ref 80.0–100.0)
Monocytes Absolute: 0 10*3/uL — ABNORMAL LOW (ref 0.1–1.0)
Monocytes Relative: 3 %
Neutro Abs: 0 10*3/uL — CL (ref 1.7–7.7)
Neutrophils Relative %: 4 %
Platelets: 75 10*3/uL — ABNORMAL LOW (ref 150–400)
RBC: 2.37 MIL/uL — ABNORMAL LOW (ref 3.87–5.11)
RDW: 14.5 % (ref 11.5–15.5)
WBC: 0.3 10*3/uL — CL (ref 4.0–10.5)
nRBC: 0 % (ref 0.0–0.2)

## 2024-01-19 LAB — COMPREHENSIVE METABOLIC PANEL WITH GFR
ALT: 38 U/L (ref 0–44)
AST: 43 U/L — ABNORMAL HIGH (ref 15–41)
Albumin: 2.2 g/dL — ABNORMAL LOW (ref 3.5–5.0)
Alkaline Phosphatase: 399 U/L — ABNORMAL HIGH (ref 38–126)
Anion gap: 10 (ref 5–15)
BUN: 15 mg/dL (ref 8–23)
CO2: 22 mmol/L (ref 22–32)
Calcium: 8.1 mg/dL — ABNORMAL LOW (ref 8.9–10.3)
Chloride: 95 mmol/L — ABNORMAL LOW (ref 98–111)
Creatinine, Ser: 0.66 mg/dL (ref 0.44–1.00)
GFR, Estimated: >60 mL/min (ref >60)
Glucose, Bld: 89 mg/dL (ref 70–99)
Potassium: 3.3 mmol/L — ABNORMAL LOW (ref 3.5–5.1)
Sodium: 127 mmol/L — ABNORMAL LOW (ref 135–145)
Total Bilirubin: 0.7 mg/dL (ref 0.0–1.2)
Total Protein: 5.5 g/dL — ABNORMAL LOW (ref 6.5–8.1)

## 2024-01-19 LAB — C DIFFICILE QUICK SCREEN W PCR REFLEX
C Diff antigen: NEGATIVE
C Diff interpretation: NOT DETECTED
C Diff toxin: NEGATIVE

## 2024-01-19 MED ORDER — LACTATED RINGERS IV SOLN: INTRAVENOUS | Status: AC

## 2024-01-19 MED ORDER — POTASSIUM CHLORIDE CRYS ER 20 MEQ PO TBCR
40.0000 meq | EXTENDED_RELEASE_TABLET | Freq: Once | ORAL | Status: AC
  Administered 2024-01-19: 40 meq via ORAL
  Filled 2024-01-19: qty 2

## 2024-01-19 MED ORDER — OXYCODONE HCL 5 MG PO TABS
2.5000 mg | ORAL_TABLET | ORAL | Status: DC | PRN
  Administered 2024-01-19 – 2024-01-24 (×7): 2.5 mg via ORAL
  Filled 2024-01-19 (×8): qty 1

## 2024-01-19 NOTE — Plan of Care (Signed)

## 2024-01-19 NOTE — Consult Note (Signed)
 Consultation Note Date: 01/19/2024   Patient Name: Amanda Potter  DOB: Jul 16, 1946  MRN: [id number]  Age / Sex: 78 y.o., female   PCP: Lory Rough., PA-C Referring Physician: Lesa Rape, MD  Reason for Consultation: Establishing goals of care     Chief Complaint/History of Present Illness:   Patient is an 78 year old female with a past medical history of stage IV non-small cell lung cancer on palliative chemotherapy, hypertension, and hyperlipidemia who was admitted on 01/17/2024 for management of neutropenia and severe weakness.  Upon admission, patient found to have sepsis in setting of neutropenic fever.  Unknown source of sepsis though patient having frequent diarrhea so C. difficile being checked.  Patient on broad-spectrum antibiotics.  Patient also receiving management for electrolyte abnormalities.  Oncology consulted for recommendations.  Palliative medicine team consulted to assist with complex medical decision making.  Extensive review of EMR prior to presenting to bedside.  Review of CMP today noted sodium 127, potassium 3.3, BUN 15, creatinine 0.66, and albumin 2.2.  GFR estimated to be > 60.  CBC noting WBC of 0.3, hemoglobin 7.4, hematocrit 22.1, and platelets 75. Review of EMR noted patient has ACP documentation completed on file.  Patient has designated her HCPOA as [name], primary alternate [name], and secondary alternate Gerardine Knock, Marieta Shorten.  Presented to bedside to see patient.  Patient laying in bed asleep though easily awakened.  No visitors present at bedside.  With permission, able to introduce myself as a member of the palliative medicine team and my role in patient's medical journey.  While attempting to engage in conversation with patient, was noted patient had increased work of breathing with frequent coughing.  Patient admitted to feeling short of breath and feeling uncomfortable.  Discussed with patient if any medications had helped with her shortness of  breath and work of breathing and she did not feel they had.  Patient denied taking opioids previously to assist with this.  Discussed starting low-dose opioids to assist with shortness of breath management and pain management.  Patient agreement with this plan.  With patient's increased work of breathing, could not engage in lengthy conversations regarding complex medical decision making moving forward.  Noted would continue conversations with patient hopefully if symptoms can improve with management.  Patient acknowledging this.  Discussed care with RN and hospitalist for updates.  Primary Diagnoses  Present on Admission:  Sepsis (HCC)   Palliative Review of Systems: Shortness of breath  Past Medical History:  Diagnosis Date   Deviated nasal septum    Environmental allergies    GERD (gastroesophageal reflux disease)    Hyperlipidemia    Hypertension    Perforation of colon (HCC) 2006   Following colonoscopy   Sinusitis, acute    Social History   Socioeconomic History   Marital status: Divorced    Spouse name: Not on file   Number of children: Not on file   Years of education: Not on file   Highest education level: Not on file  Occupational History   Not on file  Tobacco Use   Smoking status: Never   Smokeless tobacco: Never  Vaping Use   Vaping status: Never Used  Substance and Sexual Activity   Alcohol use: No   Drug use: No   Sexual activity: Not on file  Other Topics Concern   Not on file  Social History Narrative   Not on file   Social Drivers of Health   Financial Resource Strain: Not on file  Food Insecurity: No Food Insecurity (01/17/2024)   Hunger Vital Sign    Worried About Running Out of Food in the Last Year: Never true    Ran Out of Food in the Last Year: Never true  Transportation Needs: No Transportation Needs (01/17/2024)   PRAPARE - Administrator, Civil Service (Medical): No    Lack of Transportation (Non-Medical): No  Physical  Activity: Not on file  Stress: Not on file  Social Connections: Socially Isolated (01/17/2024)   Social Connection and Isolation Panel [NHANES]    Frequency of Communication with Friends and Family: More than three times a week    Frequency of Social Gatherings with Friends and Family: Once a week    Attends Religious Services: Never    Database administrator or Organizations: No    Attends Engineer, structural: Never    Marital Status: Divorced   Family History  Problem Relation Age of Onset   Hearing loss Mother    Diabetes Mother    Hyperlipidemia Mother    Hypertension Mother    Heart disease Father    Scheduled Meds:  aspirin  EC  81 mg Oral Q1200   cyanocobalamin   1,000 mcg Oral Daily   enoxaparin  (LOVENOX ) injection  40 mg Subcutaneous Q24H   feeding supplement  237 mL Oral TID BM   filgrastim  (NIVESTYM ) SQ  300 mcg Subcutaneous Daily   folic acid   1 mg Oral Daily   levothyroxine   50 mcg Oral Q0600   pantoprazole   40 mg Oral Daily   potassium chloride   40 mEq Oral Once   simvastatin   20 mg Oral QHS   sodium chloride   2 g Oral TID WC   Continuous Infusions:  ceFEPime  (MAXIPIME ) IV 2 g (01/19/24 0238)   lactated ringers  50 mL/hr at 01/19/24 0859   metronidazole  Stopped (01/19/24 0622)   vancomycin  Stopped (01/19/24 0436)   PRN Meds:.acetaminophen  **OR** acetaminophen , albuterol , guaiFENesin -dextromethorphan , ondansetron  **OR** ondansetron  (ZOFRAN ) IV, mouth rinse Allergies  Allergen Reactions   Latex Rash   Tape Rash    Glue on tape   CBC:    Component Value Date/Time   WBC 0.3 (LL) 01/19/2024 0034   HGB 7.4 (L) 01/19/2024 0034   HGB 8.6 (L) 01/17/2024 1049   HCT 22.1 (L) 01/19/2024 0034   PLT 75 (L) 01/19/2024 0034   PLT 147 (L) 01/17/2024 1049   MCV 93.2 01/19/2024 0034   NEUTROABS 0.0 (LL) 01/19/2024 0034   LYMPHSABS 0.3 (L) 01/19/2024 0034   MONOABS 0.0 (L) 01/19/2024 0034   EOSABS 0.0 01/19/2024 0034   BASOSABS 0.0 01/19/2024 0034    Comprehensive Metabolic Panel:    Component Value Date/Time   NA 127 (L) 01/19/2024 0034   K 3.3 (L) 01/19/2024 0034   CL 95 (L) 01/19/2024 0034   CO2 22 01/19/2024 0034   BUN 15 01/19/2024 0034   CREATININE 0.66 01/19/2024 0034   CREATININE 0.79 01/17/2024 1049   GLUCOSE 89 01/19/2024 0034   CALCIUM 8.1 (L) 01/19/2024 0034   AST 43 (H) 01/19/2024 0034   AST 37 01/17/2024 1049   ALT 38 01/19/2024 0034   ALT 55 (H) 01/17/2024 1049   ALKPHOS 399 (H) 01/19/2024 0034   BILITOT 0.7 01/19/2024 0034   BILITOT 0.6 01/17/2024 1049   PROT 5.5 (L) 01/19/2024 0034   ALBUMIN 2.2 (L) 01/19/2024 0034    Physical Exam: Vital Signs: BP (!) 140/77 (BP Location: Left Arm)   Pulse 82   Temp  98.9 F (37.2 C) (Oral)   Resp 16   Ht 5\' 6"  (1.676 m)   Wt 63.5 kg   SpO2 98%   BMI 22.60 kg/m  SpO2: SpO2: 98 % O2 Device: O2 Device: Nasal Cannula O2 Flow Rate: O2 Flow Rate (L/min): 2 L/min Intake/output summary:  Intake/Output Summary (Last 24 hours) at 01/19/2024 0901 Last data filed at 01/19/2024 0800 Gross per 24 hour  Intake 2242.01 ml  Output --  Net 2242.01 ml   LBM: Last BM Date : 01/15/24 Baseline Weight: Weight: 63.5 kg Most recent weight: Weight: 63.5 kg  General: NAD, awake, ill-appearing, frail Cardiovascular: RRR Respiratory: increased work of breathing noted, coughing noted Neuro: Awake, interactive         Palliative Performance Scale: 20%               Additional Data Reviewed: Recent Labs    01/18/24 1257 01/18/24 1450 01/19/24 0034  WBC 0.3*  --  0.3*  HGB 7.8*  --  7.4*  PLT 97*  --  75*  NA  --  124* 127*  BUN  --  16 15  CREATININE  --  0.74 0.66    Imaging: DG Chest Portable 1 View CLINICAL DATA:  Cough and fever  EXAM: PORTABLE CHEST 1 VIEW  COMPARISON:  November 13, 2023  FINDINGS: Chronic bronchial changes of the right lower lobe and left lower lobe retrocardiac atelectasis. No consolidations  No pleural effusions.  Heart and  mediastinum normal  IMPRESSION: Chronic bronchial changes of the right lower lobe and left lower lobe retrocardiac atelectasis.  Electronically Signed   By: Fredrich Jefferson M.D.   On: 01/17/2024 14:28    I personally reviewed recent imaging.   Palliative Care Assessment and Plan Summary of Established Goals of Care and Medical Treatment Preferences   Patient is an 78 year old female with a past medical history of stage IV non-small cell lung cancer on palliative chemotherapy, hypertension, and hyperlipidemia who was admitted on 01/17/2024 for management of neutropenia and severe weakness.  Upon admission, patient found to have sepsis in setting of neutropenic fever.  Unknown source of sepsis though patient having frequent diarrhea.  Patient on broad-spectrum antibiotics.  Patient also receiving management for electrolyte abnormalities.  Oncology consulted for recommendations.  Palliative medicine team consulted to assist with complex medical decision making.  # Complex medical decision making/goals of care Patient admitted for management of sepsis in setting of severe neutropenia.  Patient had recently received chemotherapy for stage IV non-small cell lung cancer.  - Met with patient today and introduced palliative medicine services.  Patient appeared actively uncomfortable when seen.  Spent time focusing on symptom management and noted would engage in conversations regarding complex medical decision making moving forward when patient able.  - Patient has ACP documentation already completed on file.  Patient named HCPOA [name], primary alternate [name], and secondary alternant Gerardine Knock, Marieta Shorten.  -  Code Status: Full Code   # Symptom management Patient is receiving these palliative interventions for symptom management with an intent to improve quality of life.   - Pain/shortness of breath, in setting of stage IV non-small cell lung cancer Patient noted to have shortness of breath  with severe coughing episodes during visit.   - Start oxycodone  2.5 mg every 4 hours as needed  # Psycho-social/Spiritual Support:  - Support System: Daughter, son  # Discharge Planning:  To Be Determined  Thank you for allowing the palliative care team to  participate in the care [name].  Barnett Libel, DO Palliative Care Provider PMT # [phone]  If patient remains symptomatic despite maximum doses, please call PMT at [phone] between 0700 and 1900. Outside of these hours, please call attending, as PMT does not have night coverage.

## 2024-01-19 NOTE — Progress Notes (Signed)
 PROGRESS NOTE Lekeysha Potter  WJX:[id number] DOB: April 24, 1946 DOA: 01/17/2024 PCP: Amanda Rough., PA-C  Brief Narrative/Hospital Course: Brief Narrative/Hospital Course: 78 y.o. female with medical history significant for stage IV non-small cell lung cancer, hypertension, hyperlipidemia on palliative systemic chemotherapy being admitted to the hospital with neutropenia and severe weakness, concerning for acute infection.  Patient was very weak, was having cough and rattly sounding breath sounds and contact with a friend with viral URI so daughter brought the patient to the ED. In the ED: Found to have tachycardia, leukopenia, pancytopenia, hyponatremia 125 elevated lactic acid.Blood cultures sent placed on empiric antibiotics.  RSV COVID influenza negative Cxr> chronic bronchial changes RLL,LLL  Subjective: Seen and examined Patient reports he feels much better about 50% compared to admission Still having cough-at times breathing harder, Overnight patient remains afebrile BP stable on 2 L [place] [facility] this morning sodium trending up 127 potassium also 3.3 WBC 0.3 with ANC 0 hemoglobin 7.4  Assessment and plan:  Severe Sepsis POA Neutropenic fever Diarrhea: Patient with neutropenia on systemic palliative chemo for stage IV NSCLCA. Sepsis etiology not clear at this time>BCx: 1/4 bottles positive for GPC, BCID shows Staphylococcus species.  Question contamination versus true infection.  Continue broad-spectrum antibiotics with vancomycin , Flagyl , cefepime . Continue daily Granix  per heme-onc.  Patient having diarrhea checking C. difficile in the setting of neutropenia.   Hyponatremia: Likely multifactorial in the setting of hypovolemia, or potentially SIADH. Note that on her previous admission, she had severe hyponatremia requiring ICU admission, and salt tablets, etc. Sodium trending up slowly continue salt tablets, IV fluids and monitor Recent Labs  Lab 01/17/24 2312 01/18/24 0335  01/18/24 0728 01/18/24 1450 01/19/24 0034  NA 124* 122* 124* 124* 127*     Stage IV NSCLCA on chemotherapy under the care of Dr. Marguerita Potter: Dr. Marguerita Potter /oncology team is following.  Palliative care has been consulted.She is full code   Pancytopenia Likely due to chemotherapy.  Continue Granix , monitor hemoglobin platelet WBC count transfuse if Hb less than 7 Recent Labs  Lab 01/18/24 0335 01/18/24 1257 01/19/24 0034  HGB 8.6* 7.8* 7.4*  HCT 27.2* 22.5* 22.1*  WBC 0.3* 0.3* 0.3*  PLT 98* 97* 75*    Hypothyroidism: initially placed on methimazole , but has discontinued this,will continue home dose Synthroid    Hyperlipidemia cont Zocor    GERD: Cont Protonix   Hypokalemia: Replace  HLD: Continue her statin  Goals of care: Patient is stage IV cancer elderly, full code, overall prognosis guarded She wishes to be full code.  Palliative care has been consulted.  Daughter has been updated 4/25    DVT prophylaxis: enoxaparin  (LOVENOX ) injection 40 mg Start: 01/17/24 2200 Code Status:   Code Status: Full Code Family Communication: plan of care discussed with patient/daughter updated over the phone previously Patient status is: Remains hospitalized because of severity of illness Level of care: Progressive   Dispo: The patient is from: home            Anticipated disposition: TBD Objective: Vitals last 24 hrs: Vitals:   01/18/24 1035 01/18/24 1141 01/18/24 2024 01/19/24 0444  BP: 128/69  132/69 (!) 140/77  Pulse: 99  91 82  Resp: 14  20 16   Temp: 99.7 F (37.6 C)  99.5 F (37.5 C) 98.9 F (37.2 C)  TempSrc: Oral  Oral Oral  SpO2: (!) 89% 94% 100% 98%  Weight:      Height:        Physical Examination: General exam: alert awake, frail-appearing elderly  older than stated age HEENT:Oral mucosa moist, Ear/Nose WNL grossly Respiratory system: Bilaterally diminished w/ basal crackles, no use of accessory muscle Cardiovascular system: S1 & S2 +. Gastrointestinal system:  Abdomen soft, NT,ND,BS+ Nervous System: Alert, awake,following commands. Extremities: LE edema neg, moving arms legs, warm legs Skin: No rashes,warm. MSK: Normal muscle bulk/tone.   Data Reviewed: I have personally reviewed following labs and imaging studies ( see epic result tab) CBC: Recent Labs  Lab 01/17/24 1049 01/17/24 1628 01/18/24 0335 01/18/24 1257 01/19/24 0034  WBC 0.3* 0.2* 0.3* 0.3* 0.3*  NEUTROABS 0.0*  --   --  0.0* 0.0*  HGB 8.6* 8.0* 8.6* 7.8* 7.4*  HCT 24.4* 23.6* 27.2* 22.5* 22.1*  MCV 88.7 93.3 100.0 91.1 93.2  PLT 147* 116* 98* 97* 75*   CMP: Recent Labs  Lab 01/17/24 2312 01/18/24 0335 01/18/24 0728 01/18/24 1450 01/19/24 0034  NA 124* 122* 124* 124* 127*  K 3.2* 3.7 3.2* 3.1* 3.3*  CL 92* 90* 90* 92* 95*  CO2 23 21* 23 23 22   GLUCOSE 125* 97 89 104* 89  BUN 18 17 17 16 15   CREATININE 0.74 0.78 0.65 0.74 0.66  CALCIUM 8.0* 7.9* 8.3* 7.8* 8.1*   GFR: Estimated Creatinine Clearance: 52.5 mL/min (by C-G formula based on SCr of 0.66 mg/dL). Recent Labs  Lab 01/17/24 1049 01/17/24 1628 01/18/24 0335 01/19/24 0034  AST 37 37 43* 43*  ALT 55* 49* 46* 38  ALKPHOS 745* 589* 511* 399*  BILITOT 0.6 0.6 1.1 0.7  PROT 6.4* 6.0* 5.9* 5.5*  ALBUMIN 3.1* 2.6* 2.4* 2.2*   No results for input(s): "LIPASE", "AMYLASE" in the last 168 hours. No results for input(s): "AMMONIA" in the last 168 hours. Coagulation Profile:  Recent Labs  Lab 01/17/24 1628  INR 1.0   Medications reviewed:  Scheduled Meds:  aspirin  EC  81 mg Oral Q1200   cyanocobalamin   1,000 mcg Oral Daily   enoxaparin  (LOVENOX ) injection  40 mg Subcutaneous Q24H   feeding supplement  237 mL Oral TID BM   filgrastim  (NIVESTYM ) SQ  300 mcg Subcutaneous Daily   folic acid   1 mg Oral Daily   levothyroxine   50 mcg Oral Q0600   pantoprazole   40 mg Oral Daily   simvastatin   20 mg Oral QHS   sodium chloride   2 g Oral TID WC   Continuous Infusions:  ceFEPime  (MAXIPIME ) IV 2 g (01/19/24  0238)   lactated ringers  50 mL/hr at 01/19/24 0903   metronidazole  Stopped (01/19/24 0622)   vancomycin  Stopped (01/19/24 0436)    Total time spent in review of labs and imaging, patient evaluation, formulation of plan, documentation and communication with patient/family: 50 minutes  Amanda Rape, MD [facility] 01/19/2024, 10:52 AM

## 2024-01-20 DIAGNOSIS — C3491 Malignant neoplasm of unspecified part of right bronchus or lung: Secondary | ICD-10-CM

## 2024-01-20 DIAGNOSIS — R0602 Shortness of breath: Secondary | ICD-10-CM

## 2024-01-20 DIAGNOSIS — Z515 Encounter for palliative care: Secondary | ICD-10-CM | POA: Diagnosis not present

## 2024-01-20 DIAGNOSIS — T451X5A Adverse effect of antineoplastic and immunosuppressive drugs, initial encounter: Secondary | ICD-10-CM

## 2024-01-20 DIAGNOSIS — Z7189 Other specified counseling: Secondary | ICD-10-CM

## 2024-01-20 DIAGNOSIS — Z79899 Other long term (current) drug therapy: Secondary | ICD-10-CM

## 2024-01-20 DIAGNOSIS — D701 Agranulocytosis secondary to cancer chemotherapy: Secondary | ICD-10-CM

## 2024-01-20 LAB — CBC WITH DIFFERENTIAL/PLATELET
Abs Immature Granulocytes: 0 10*3/uL (ref 0.00–0.07)
Basophils Absolute: 0 10*3/uL (ref 0.0–0.1)
Basophils Relative: 0 %
Eosinophils Absolute: 0 10*3/uL (ref 0.0–0.5)
Eosinophils Relative: 0 %
HCT: 25.1 % — ABNORMAL LOW (ref 36.0–46.0)
Hemoglobin: 8.2 g/dL — ABNORMAL LOW (ref 12.0–15.0)
Immature Granulocytes: 0 %
Lymphocytes Relative: 89 %
Lymphs Abs: 0.4 10*3/uL — ABNORMAL LOW (ref 0.7–4.0)
MCH: 31.1 pg (ref 26.0–34.0)
MCHC: 32.7 g/dL (ref 30.0–36.0)
MCV: 95.1 fL (ref 80.0–100.0)
Monocytes Absolute: 0 10*3/uL — ABNORMAL LOW (ref 0.1–1.0)
Monocytes Relative: 8 %
Neutro Abs: 0 10*3/uL — CL (ref 1.7–7.7)
Neutrophils Relative %: 3 %
Platelets: DECREASED 10*3/uL (ref 150–400)
RBC: 2.64 MIL/uL — ABNORMAL LOW (ref 3.87–5.11)
RDW: 14.3 % (ref 11.5–15.5)
WBC: 0.4 10*3/uL — CL (ref 4.0–10.5)
nRBC: 0 % (ref 0.0–0.2)

## 2024-01-20 LAB — CULTURE, BLOOD (ROUTINE X 2)

## 2024-01-20 LAB — COMPREHENSIVE METABOLIC PANEL WITH GFR
ALT: 33 U/L (ref 0–44)
AST: 35 U/L (ref 15–41)
Albumin: 2.2 g/dL — ABNORMAL LOW (ref 3.5–5.0)
Alkaline Phosphatase: 344 U/L — ABNORMAL HIGH (ref 38–126)
Anion gap: 10 (ref 5–15)
BUN: 10 mg/dL (ref 8–23)
CO2: 25 mmol/L (ref 22–32)
Calcium: 8.2 mg/dL — ABNORMAL LOW (ref 8.9–10.3)
Chloride: 94 mmol/L — ABNORMAL LOW (ref 98–111)
Creatinine, Ser: 0.54 mg/dL (ref 0.44–1.00)
GFR, Estimated: >60 mL/min (ref >60)
Glucose, Bld: 84 mg/dL (ref 70–99)
Potassium: 2.8 mmol/L — ABNORMAL LOW (ref 3.5–5.1)
Sodium: 129 mmol/L — ABNORMAL LOW (ref 135–145)
Total Bilirubin: 1 mg/dL (ref 0.0–1.2)
Total Protein: 5.8 g/dL — ABNORMAL LOW (ref 6.5–8.1)

## 2024-01-20 LAB — MAGNESIUM: Magnesium: 1.7 mg/dL (ref 1.7–2.4)

## 2024-01-20 MED ORDER — POTASSIUM CHLORIDE CRYS ER 20 MEQ PO TBCR
40.0000 meq | EXTENDED_RELEASE_TABLET | Freq: Once | ORAL | Status: AC
  Administered 2024-01-20: 40 meq via ORAL
  Filled 2024-01-20: qty 2

## 2024-01-20 MED ORDER — POTASSIUM CHLORIDE 10 MEQ/100ML IV SOLN
10.0000 meq | INTRAVENOUS | Status: AC
  Administered 2024-01-20 (×2): 10 meq via INTRAVENOUS
  Filled 2024-01-20 (×2): qty 100

## 2024-01-20 MED ORDER — DEXTROSE-SODIUM CHLORIDE 5-0.45 % IV SOLN: INTRAVENOUS | Status: AC

## 2024-01-20 NOTE — Plan of Care (Signed)

## 2024-01-20 NOTE — Progress Notes (Addendum)
 [facility]  PROGRESS NOTE  Amanda Potter HQI:[id number] DOB: 06-Jul-1946 DOA: 01/17/2024 PCP: Lory Rough., PA-C   Brief HPI:   78 y.o. female with medical history significant for stage IV non-small cell lung cancer, hypertension, hyperlipidemia on palliative systemic chemotherapy being admitted to the hospital with neutropenia and severe weakness, concerning for acute infection.  Patient was very weak, was having cough and rattly sounding breath sounds and contact with a friend with viral URI so daughter brought the patient to the ED. In the ED: Found to have tachycardia, leukopenia, pancytopenia, hyponatremia 125 elevated lactic acid.Blood cultures sent placed on empiric antibiotics.  RSV COVID influenza negative Cxr> chronic bronchial changes RLL,LLL    Assessment/Plan:   Severe Sepsis POA Neutropenic fever Diarrhea: Patient with neutropenia on systemic palliative chemo for stage IV NSCLCA. Sepsis etiology not clear at this time>BCx: 1/4 bottles positive for GPC, BCID shows Staphylococcus species.  Question contamination versus true infection.  Continue broad-spectrum antibiotics with vancomycin , Flagyl , cefepime . -Stool for C. difficile negative for PCR Continue daily Granix  per heme-onc.     Hyponatremia: Likely multifactorial in the setting of hypovolemia, or potentially SIADH. Note that on her previous admission, she had severe hyponatremia requiring ICU admission, and salt tablets, etc. Sodium is improving with salt tablets 2 g p.o. 3 times daily    Stage IV NSCLCA on chemotherapy under the care of Dr. Marguerita Shih: Dr. Marguerita Shih /oncology team is following.  Palliative care has been consulted.She is full code   Pancytopenia Likely due to chemotherapy.  Continue Granix , monitor hemoglobin platelet WBC count transfuse if Hb less than 7  Hypothyroidism: initially placed on methimazole , but has discontinued this,will continue home dose Synthroid    Hyperlipidemia cont  Zocor    GERD: Cont Protonix    Hypokalemia: Replace   HLD: Continue her statin   Goals of care: Patient is stage IV cancer elderly, full code, overall prognosis guarded She wishes to be full code.  Palliative care has been consulted.        Medications     aspirin  EC  81 mg Oral Q1200   cyanocobalamin   1,000 mcg Oral Daily   enoxaparin  (LOVENOX ) injection  40 mg Subcutaneous Q24H   feeding supplement  237 mL Oral TID BM   filgrastim  (NIVESTYM ) SQ  300 mcg Subcutaneous Daily   folic acid   1 mg Oral Daily   levothyroxine   50 mcg Oral Q0600   pantoprazole   40 mg Oral Daily   simvastatin   20 mg Oral QHS   sodium chloride   2 g Oral TID WC     Data Reviewed:   CBG:  No results for input(s): "GLUCAP" in the last 168 hours.  SpO2: 96 % O2 Flow Rate (L/min): 2 L/min    Vitals:   01/19/24 0444 01/19/24 1403 01/19/24 2126 01/20/24 0455  BP: (!) 140/77 126/75 (!) 142/78 (!) 154/88  Pulse: 82 97 96 92  Resp: 16 15 16 16   Temp: 98.9 F (37.2 C) 97.7 F (36.5 C) 97.9 F (36.6 C) 98.4 F (36.9 C)  TempSrc: Oral Oral Oral Oral  SpO2: 98% 96% 99% 96%  Weight:      Height:          Data Reviewed:  Basic Metabolic Panel: Recent Labs  Lab 01/18/24 0335 01/18/24 0728 01/18/24 1450 01/19/24 0034 01/20/24 0348  NA 122* 124* 124* 127* 129*  K 3.7 3.2* 3.1* 3.3* 2.8*  CL 90* 90* 92* 95* 94*  CO2 21* 23 23 22  25  GLUCOSE 97 89 104* 89 84  BUN 17 17 16 15 10   CREATININE 0.78 0.65 0.74 0.66 0.54  CALCIUM 7.9* 8.3* 7.8* 8.1* 8.2*    CBC: Recent Labs  Lab 01/17/24 1049 01/17/24 1628 01/18/24 0335 01/18/24 1257 01/19/24 0034 01/20/24 0348  WBC 0.3* 0.2* 0.3* 0.3* 0.3* 0.4*  NEUTROABS 0.0*  --   --  0.0* 0.0* 0.0*  HGB 8.6* 8.0* 8.6* 7.8* 7.4* 8.2*  HCT 24.4* 23.6* 27.2* 22.5* 22.1* 25.1*  MCV 88.7 93.3 100.0 91.1 93.2 95.1  PLT 147* 116* 98* 97* 75* PLATELET CLUMPS NOTED ON SMEAR, COUNT APPEARS DECREASED    LFT Recent Labs  Lab 01/17/24 1049  01/17/24 1628 01/18/24 0335 01/19/24 0034 01/20/24 0348  AST 37 37 43* 43* 35  ALT 55* 49* 46* 38 33  ALKPHOS 745* 589* 511* 399* 344*  BILITOT 0.6 0.6 1.1 0.7 1.0  PROT 6.4* 6.0* 5.9* 5.5* 5.8*  ALBUMIN 3.1* 2.6* 2.4* 2.2* 2.2*     Antibiotics: Anti-infectives (From admission, onward)    Start     Dose/Rate Route Frequency Ordered Stop   01/18/24 0500  metroNIDAZOLE  (FLAGYL ) IVPB 500 mg        500 mg 100 mL/hr over 60 Minutes Intravenous Every 12 hours 01/18/24 0158     01/18/24 0400  vancomycin  (VANCOCIN ) IVPB 1000 mg/200 mL premix        1,000 mg 200 mL/hr over 60 Minutes Intravenous Every 24 hours 01/18/24 0158     01/18/24 0300  ceFEPIme  (MAXIPIME ) 2 g in sodium chloride  0.9 % 100 mL IVPB        2 g 200 mL/hr over 30 Minutes Intravenous Every 12 hours 01/18/24 0158     01/17/24 1400  ceFEPIme  (MAXIPIME ) 2 g in sodium chloride  0.9 % 100 mL IVPB        2 g 200 mL/hr over 30 Minutes Intravenous  Once 01/17/24 1357 01/17/24 1621   01/17/24 1400  metroNIDAZOLE  (FLAGYL ) IVPB 500 mg        500 mg 100 mL/hr over 60 Minutes Intravenous  Once 01/17/24 1357 01/17/24 1736   01/17/24 1400  vancomycin  (VANCOCIN ) IVPB 1000 mg/200 mL premix        1,000 mg 200 mL/hr over 60 Minutes Intravenous  Once 01/17/24 1357 01/17/24 1751        DVT prophylaxis: SCDs  Code Status: Full code  Family Communication: No family at bedside   CONSULTS oncology   Subjective   Denies any complaints   Objective    Physical Examination:   General-appears in no acute distress Heart-S1-S2, regular, no murmur auscultated Lungs-clear to auscultation bilaterally, no wheezing or crackles auscultated Abdomen-soft, nontender, no organomegaly Extremities-no edema in the lower extremities Neuro-alert, oriented x3, no focal deficit noted  Status is: Inpatient:             Amanda Potter   [facility] If 7PM-7AM, please contact night-coverage at [email or web address], Office   [phone]   01/20/2024, 7:55 AM  LOS: 3 days

## 2024-01-20 NOTE — Progress Notes (Signed)
 Daily Progress Note   Patient Name: Amanda Potter       Date: 01/20/2024 DOB: May 10, 1946  Age: 78 y.o. MRN#: [id number] Attending Physician: Ozell Blunt, MD Primary Care Physician: Lory Rough., PA-C Admit Date: 01/17/2024 Length of Stay: 3 days  Reason for Consultation/Follow-up: Establishing goals of care  Subjective:   CC: Patient laying in bed receiving patient care.  Following up regarding complex medical decision making.  Subjective:  At time of EMR review in past 24 hours patient has received as needed oxycodone  2.5 mg x 1 dose.  Review of CMP noting sodium 129, potassium 2.8, BUN 10, creatinine 0.54, and GFR estimated to be > 60.  Patient CBC noting WBC 0.4, hemoglobin 8.2, hematocrit 25.1, and unable to determine on platelets as they were clumped on smear analysis.  Presented to bedside to see patient.  RN present at bedside with tech providing patient care.  No family present at bedside during visit.  Again able to introduce myself as a member of the palliative medicine team.  Patient remembers speaking with this provider yesterday.  Inquired how patient was feeling today.  Patient notes that she still feels short of breath at this time.  Inquired if patient felt oxycodone  did assist with her shortness of breath and she noted that she believed it did.  RN noted she would provide dose at this time to see if assist with patient's symptom burden.  Spent time providing emotional support via active listening.  All questions answered at that time.  Noted palliative medicine team to continue to follow with patient's medical journey.  Discussed care with hospitalist to coordinate care as well.  Objective:   Vital Signs:  BP (!) 154/88 (BP Location: Right Arm)   Pulse 92   Temp 98.4 F (36.9 C) (Oral)   Resp 16   Ht 5\' 6"  (1.676 m)   Wt 63.5 kg   SpO2 96%   BMI 22.60 kg/m   Physical Exam: General: NAD, awake, ill-appearing, frail Cardiovascular: RRR Respiratory:  increased work of breathing noted, coughing noted Neuro: Awake, interactive  Imaging: I personally reviewed recent imaging.   Assessment & Plan:   Assessment: Patient is an 78 year old female with a past medical history of stage IV non-small cell lung cancer on palliative chemotherapy, hypertension, and hyperlipidemia who was admitted on 01/17/2024 for management of neutropenia and severe weakness.  Upon admission, patient found to have sepsis in setting of neutropenic fever.  Unknown source of sepsis though patient having frequent diarrhea.  Patient on broad-spectrum antibiotics.  Patient also receiving management for electrolyte abnormalities.  Oncology consulted for recommendations.  Palliative medicine team consulted to assist with complex medical decision making.  Recommendations/Plan: # Complex medical decision making/goals of care: Patient admitted for management of sepsis in setting of severe neutropenia.  Patient had recently received chemotherapy for stage IV non-small cell lung cancer.                - Discussed care with patient today as detailed above in HPI.  No family present at bedside during visit.  Patient cares more awake and interactive today.  Patient had already expressed to hospitalist desire to remain full code.  Noted palliative medicine team to continue to engage in conversations as able and appropriate.                - Patient has ACP documentation already completed on file.  Patient named HCPOA [name], primary alternate [name], and secondary  alternant Gerardine Knock, Marieta Shorten.                -  Code Status: Full Code    # Symptom management Patient is receiving these palliative interventions for symptom management with an intent to improve quality of life.                 - Pain/shortness of breath, in setting of stage IV non-small cell lung cancer Patient noted to have shortness of breath during visit.                               - Continue oxycodone  2.5 mg  every 4 hours as needed.  Noted can increase to 5 mg if feels dose is not providing relief.   # Psycho-social/Spiritual Support:  - Support System: Daughter, son   Thank you for allowing the palliative care team to participate in the care [name].  Barnett Libel, DO Palliative Care Provider PMT # [phone]  If patient remains symptomatic despite maximum doses, please call PMT at [phone] between 0700 and 1900. Outside of these hours, please call attending, as PMT does not have night coverage.

## 2024-01-21 DIAGNOSIS — Z79899 Other long term (current) drug therapy: Secondary | ICD-10-CM | POA: Diagnosis not present

## 2024-01-21 DIAGNOSIS — C3491 Malignant neoplasm of unspecified part of right bronchus or lung: Secondary | ICD-10-CM | POA: Diagnosis not present

## 2024-01-21 DIAGNOSIS — Z515 Encounter for palliative care: Secondary | ICD-10-CM | POA: Diagnosis not present

## 2024-01-21 DIAGNOSIS — D701 Agranulocytosis secondary to cancer chemotherapy: Secondary | ICD-10-CM | POA: Diagnosis not present

## 2024-01-21 DIAGNOSIS — D709 Neutropenia, unspecified: Secondary | ICD-10-CM | POA: Diagnosis not present

## 2024-01-21 DIAGNOSIS — E871 Hypo-osmolality and hyponatremia: Secondary | ICD-10-CM | POA: Diagnosis not present

## 2024-01-21 DIAGNOSIS — Z7189 Other specified counseling: Secondary | ICD-10-CM | POA: Diagnosis not present

## 2024-01-21 DIAGNOSIS — R4589 Other symptoms and signs involving emotional state: Secondary | ICD-10-CM

## 2024-01-21 DIAGNOSIS — A419 Sepsis, unspecified organism: Secondary | ICD-10-CM | POA: Diagnosis not present

## 2024-01-21 LAB — CBC WITH DIFFERENTIAL/PLATELET
Abs Immature Granulocytes: 0 10*3/uL (ref 0.00–0.07)
Basophils Absolute: 0 10*3/uL (ref 0.0–0.1)
Basophils Relative: 0 %
Eosinophils Absolute: 0 10*3/uL (ref 0.0–0.5)
Eosinophils Relative: 0 %
HCT: 23 % — ABNORMAL LOW (ref 36.0–46.0)
Hemoglobin: 7.5 g/dL — ABNORMAL LOW (ref 12.0–15.0)
Immature Granulocytes: 0 %
Lymphocytes Relative: 84 %
Lymphs Abs: 0.4 10*3/uL — ABNORMAL LOW (ref 0.7–4.0)
MCH: 30.7 pg (ref 26.0–34.0)
MCHC: 32.6 g/dL (ref 30.0–36.0)
MCV: 94.3 fL (ref 80.0–100.0)
Monocytes Absolute: 0.1 10*3/uL (ref 0.1–1.0)
Monocytes Relative: 16 %
Neutro Abs: 0 10*3/uL — CL (ref 1.7–7.7)
Neutrophils Relative %: 0 %
Platelets: 30 10*3/uL — ABNORMAL LOW (ref 150–400)
RBC: 2.44 MIL/uL — ABNORMAL LOW (ref 3.87–5.11)
RDW: 13.9 % (ref 11.5–15.5)
WBC: 0.5 10*3/uL — CL (ref 4.0–10.5)
nRBC: 0 % (ref 0.0–0.2)

## 2024-01-21 LAB — COMPREHENSIVE METABOLIC PANEL WITH GFR
ALT: 26 U/L (ref 0–44)
AST: 28 U/L (ref 15–41)
Albumin: 2 g/dL — ABNORMAL LOW (ref 3.5–5.0)
Alkaline Phosphatase: 287 U/L — ABNORMAL HIGH (ref 38–126)
Anion gap: 9 (ref 5–15)
BUN: 9 mg/dL (ref 8–23)
CO2: 25 mmol/L (ref 22–32)
Calcium: 8 mg/dL — ABNORMAL LOW (ref 8.9–10.3)
Chloride: 96 mmol/L — ABNORMAL LOW (ref 98–111)
Creatinine, Ser: 0.5 mg/dL (ref 0.44–1.00)
GFR, Estimated: >60 mL/min (ref >60)
Glucose, Bld: 106 mg/dL — ABNORMAL HIGH (ref 70–99)
Potassium: 2.8 mmol/L — ABNORMAL LOW (ref 3.5–5.1)
Sodium: 130 mmol/L — ABNORMAL LOW (ref 135–145)
Total Bilirubin: 1 mg/dL (ref 0.0–1.2)
Total Protein: 5.5 g/dL — ABNORMAL LOW (ref 6.5–8.1)

## 2024-01-21 MED ORDER — FILGRASTIM-AAFI 300 MCG/0.5ML IJ SOSY
300.0000 ug | PREFILLED_SYRINGE | Freq: Every day | INTRAMUSCULAR | Status: DC
Start: 2024-01-21 — End: 2024-01-27
  Administered 2024-01-21 – 2024-01-25 (×5): 300 ug via SUBCUTANEOUS
  Filled 2024-01-21 (×7): qty 0.5

## 2024-01-21 MED ORDER — POTASSIUM CHLORIDE 10 MEQ/100ML IV SOLN
10.0000 meq | INTRAVENOUS | Status: AC
  Administered 2024-01-21 (×4): 10 meq via INTRAVENOUS
  Filled 2024-01-21 (×4): qty 100

## 2024-01-21 MED ORDER — MAGNESIUM SULFATE IN D5W 1-5 GM/100ML-% IV SOLN
1.0000 g | Freq: Once | INTRAVENOUS | Status: AC
  Administered 2024-01-21: 1 g via INTRAVENOUS
  Filled 2024-01-21: qty 100

## 2024-01-21 NOTE — Progress Notes (Signed)
 Amanda Potter   DOB:11-17-1945   RU#:[id number]      ASSESSMENT & PLAN:  Neutropenic fever - Likely due to recent chemotherapy received 01/10/2024 - WBC remains low 0.5 with ANC 0.0 despite having received filgrastim  300 mcg x 4 days. - Ordered additional daily doses of filgrastim  300 mcg until ANC is greater than 1500.  Monitor ANC closely. -Currently afebrile - Continue antibiotics as ordered - Continue to monitor fever curve - Continue to monitor CBC with differential   Non-small cell lung cancer History of malignant pleural effusion - Diagnosed January 2025 - Started on palliative chemotherapy with carboplatin , pemetrexed  and pembrolizumab , initiated on 10/30/2023. - Most recent chemotherapy C3 with only carboplatin  and Alimta given 01/10/2024.  Pembrolizumab  held secondary to elevated liver enzymes.  Seen in outpatient oncology office 01/10/2024. - Medical oncology/Dr. Marguerita Shih following.     Electrolyte imbalance - As evidenced by hyponatremia and hypokalemia. - Continue electrolyte replacement as appropriate  - Continue to monitor BMP   Elevated LFTs - LFTs have normalized.  Bilirubin remains WNL  - Continue to monitor hepatic function    Code Status Full  Subjective:  Patient seen asleep in bed, awakens easily to verbal stimuli.  She is chronically ill-appearing.  States she is "tired".  Denies acute pain.  Objective:  Vitals:   01/20/24 2017 01/21/24 0502  BP: 139/69 (!) 141/77  Pulse: (!) 109 99  Resp: 18   Temp: 98.1 F (36.7 C) 97.9 F (36.6 C)  SpO2: 96% 98%     Intake/Output Summary (Last 24 hours) at 01/21/2024 1022 Last data filed at 01/21/2024 9147 Gross per 24 hour  Intake 1831.38 ml  Output --  Net 1831.38 ml     PHYSICAL EXAMINATION: ECOG PERFORMANCE STATUS: 4 - Bedbound  Vitals:   01/20/24 2017 01/21/24 0502  BP: 139/69 (!) 141/77  Pulse: (!) 109 99  Resp: 18   Temp: 98.1 F (36.7 C) 97.9 F (36.6 C)  SpO2: 96% 98%   Filed Weights    01/17/24 1232  Weight: 140 lb (63.5 kg)    GENERAL: alert, + ill-appearing + frail  SKIN: + Pale skin color, texture, turgor are normal, no rashes or significant lesions EYES: normal, conjunctiva are pink and non-injected, sclera clear OROPHARYNX: no exudate, no erythema and lips, buccal mucosa, and tongue normal  NECK: supple, thyroid  normal size, non-tender, without nodularity LYMPH: no palpable lymphadenopathy in the cervical, axillary or inguinal LUNGS: clear to auscultation and percussion with normal breathing effort HEART: regular rate & rhythm and no murmurs and no lower extremity edema ABDOMEN: abdomen soft, non-tender and normal bowel sounds MUSCULOSKELETAL: no cyanosis of digits and no clubbing  PSYCH: +Alert & oriented x 2 with fluent speech    All questions were answered. The patient knows to call the clinic with any problems, questions or concerns.   The total time spent in the appointment was 40 minutes encounter with patient including review of chart and various tests results, discussions about plan of care and coordination of care plan  Jacqualin Mate, NP 01/21/2024 10:22 AM    Labs Reviewed:  Lab Results  Component Value Date   WBC 0.5 (LL) 01/21/2024   HGB 7.5 (L) 01/21/2024   HCT 23.0 (L) 01/21/2024   MCV 94.3 01/21/2024   PLT 30 (L) 01/21/2024   Recent Labs    01/19/24 0034 01/20/24 0348 01/21/24 0323  NA 127* 129* 130*  K 3.3* 2.8* 2.8*  CL 95* 94* 96*  CO2 22  25 25  GLUCOSE 89 84 106*  BUN 15 10 9   CREATININE 0.66 0.54 0.50  CALCIUM 8.1* 8.2* 8.0*  GFRNONAA >60 >60 >60  PROT 5.5* 5.8* 5.5*  ALBUMIN 2.2* 2.2* 2.0*  AST 43* 35 28  ALT 38 33 26  ALKPHOS 399* 344* 287*  BILITOT 0.7 1.0 1.0    Studies Reviewed:  DG Chest Portable 1 View Result Date: 01/17/2024 CLINICAL DATA:  Cough and fever EXAM: PORTABLE CHEST 1 VIEW COMPARISON:  November 13, 2023 FINDINGS: Chronic bronchial changes of the right lower lobe and left lower lobe retrocardiac  atelectasis. No consolidations No pleural effusions. Heart and mediastinum normal IMPRESSION: Chronic bronchial changes of the right lower lobe and left lower lobe retrocardiac atelectasis. Electronically Signed   By: Fredrich Jefferson M.D.   On: 01/17/2024 14:28

## 2024-01-21 NOTE — Progress Notes (Addendum)
 [facility]  PROGRESS NOTE  Amanda Potter ZOX:[id number] DOB: Jul 29, 1946 DOA: 01/17/2024 PCP: Lory Rough., PA-C   Brief HPI:   78 y.o. female with medical history significant for stage IV non-small cell lung cancer, hypertension, hyperlipidemia on palliative systemic chemotherapy being admitted to the hospital with neutropenia and severe weakness, concerning for acute infection.  Patient was very weak, was having cough and rattly sounding breath sounds and contact with a friend with viral URI so daughter brought the patient to the ED. In the ED: Found to have tachycardia, leukopenia, pancytopenia, hyponatremia 125 elevated lactic acid.Blood cultures sent placed on empiric antibiotics.  RSV COVID influenza negative Cxr> chronic bronchial changes RLL,LLL    Assessment/Plan:   Severe Sepsis POA Neutropenic fever Staph hemolyticus bacteremia Patient with neutropenia on systemic palliative chemo for stage IV NSCLCA. Sepsis etiology not clear at this time>BCx: 1/4 bottles positive for GPC, BCID shows Staphylococcus species.  -Single set of blood cultures positive for Staphylococcus hemolyticus, likely contamination -Discussed with ID, will discontinue vancomycin  and Flagyl  -Continue with cefepime  -Stool for C. difficile negative for PCR Continue daily Granix  per heme-onc.     Hyponatremia: Likely multifactorial in the setting of hypovolemia, or potentially SIADH. Note that on her previous admission, she had severe hyponatremia requiring ICU admission, and salt tablets, etc. Sodium is improving with salt tablets 2 g p.o. 3 times daily    Stage IV NSCLCA on chemotherapy under the care of Dr. Marguerita Shih: Dr. Marguerita Shih /oncology team is following.  Palliative care has been consulted.She is full code   Pancytopenia Likely due to chemotherapy.  Continue Granix , monitor hemoglobin platelet WBC count transfuse if Hb less than 7  Hypothyroidism: initially placed on methimazole , but has  discontinued this,will continue home dose Synthroid    Hyperlipidemia cont Zocor    GERD: Cont Protonix    Hypokalemia: Replace and follow potassium level in a.m.   HLD: Continue her statin   Goals of care: Patient is stage IV cancer elderly, full code, overall prognosis guarded She wishes to be full code.  Palliative care has been consulted.        Medications     aspirin  EC  81 mg Oral Q1200   cyanocobalamin   1,000 mcg Oral Daily   feeding supplement  237 mL Oral TID BM   folic acid   1 mg Oral Daily   levothyroxine   50 mcg Oral Q0600   pantoprazole   40 mg Oral Daily   simvastatin   20 mg Oral QHS   sodium chloride   2 g Oral TID WC     Data Reviewed:   CBG:  No results for input(s): "GLUCAP" in the last 168 hours.  SpO2: 98 % O2 Flow Rate (L/min): 2 L/min    Vitals:   01/20/24 0455 01/20/24 1547 01/20/24 2017 01/21/24 0502  BP: (!) 154/88 125/66 139/69 (!) 141/77  Pulse: 92 95 (!) 109 99  Resp: 16 18 18    Temp: 98.4 F (36.9 C) 97.7 F (36.5 C) 98.1 F (36.7 C) 97.9 F (36.6 C)  TempSrc: Oral Oral Oral Oral  SpO2: 96% 96% 96% 98%  Weight:      Height:          Data Reviewed:  Basic Metabolic Panel: Recent Labs  Lab 01/18/24 0728 01/18/24 1450 01/19/24 0034 01/20/24 0348 01/21/24 0323  NA 124* 124* 127* 129* 130*  K 3.2* 3.1* 3.3* 2.8* 2.8*  CL 90* 92* 95* 94* 96*  CO2 23 23 22 25 25   GLUCOSE 89 104*  89 84 106*  BUN 17 16 15 10 9   CREATININE 0.65 0.74 0.66 0.54 0.50  CALCIUM 8.3* 7.8* 8.1* 8.2* 8.0*  MG  --   --   --  1.7  --     CBC: Recent Labs  Lab 01/17/24 1049 01/17/24 1628 01/18/24 0335 01/18/24 1257 01/19/24 0034 01/20/24 0348 01/21/24 0323  WBC 0.3*   < > 0.3* 0.3* 0.3* 0.4* 0.5*  NEUTROABS 0.0*  --   --  0.0* 0.0* 0.0* 0.0*  HGB 8.6*   < > 8.6* 7.8* 7.4* 8.2* 7.5*  HCT 24.4*   < > 27.2* 22.5* 22.1* 25.1* 23.0*  MCV 88.7   < > 100.0 91.1 93.2 95.1 94.3  PLT 147*   < > 98* 97* 75* PLATELET CLUMPS NOTED ON SMEAR,  COUNT APPEARS DECREASED 30*   < > = values in this interval not displayed.    LFT Recent Labs  Lab 01/17/24 1628 01/18/24 0335 01/19/24 0034 01/20/24 0348 01/21/24 0323  AST 37 43* 43* 35 28  ALT 49* 46* 38 33 26  ALKPHOS 589* 511* 399* 344* 287*  BILITOT 0.6 1.1 0.7 1.0 1.0  PROT 6.0* 5.9* 5.5* 5.8* 5.5*  ALBUMIN 2.6* 2.4* 2.2* 2.2* 2.0*     Antibiotics: Anti-infectives (From admission, onward)    Start     Dose/Rate Route Frequency Ordered Stop   01/18/24 0500  metroNIDAZOLE  (FLAGYL ) IVPB 500 mg        500 mg 100 mL/hr over 60 Minutes Intravenous Every 12 hours 01/18/24 0158     01/18/24 0400  vancomycin  (VANCOCIN ) IVPB 1000 mg/200 mL premix        1,000 mg 200 mL/hr over 60 Minutes Intravenous Every 24 hours 01/18/24 0158     01/18/24 0300  ceFEPIme  (MAXIPIME ) 2 g in sodium chloride  0.9 % 100 mL IVPB        2 g 200 mL/hr over 30 Minutes Intravenous Every 12 hours 01/18/24 0158     01/17/24 1400  ceFEPIme  (MAXIPIME ) 2 g in sodium chloride  0.9 % 100 mL IVPB        2 g 200 mL/hr over 30 Minutes Intravenous  Once 01/17/24 1357 01/17/24 1621   01/17/24 1400  metroNIDAZOLE  (FLAGYL ) IVPB 500 mg        500 mg 100 mL/hr over 60 Minutes Intravenous  Once 01/17/24 1357 01/17/24 1736   01/17/24 1400  vancomycin  (VANCOCIN ) IVPB 1000 mg/200 mL premix        1,000 mg 200 mL/hr over 60 Minutes Intravenous  Once 01/17/24 1357 01/17/24 1751        DVT prophylaxis: SCDs  Code Status: Full code  Family Communication: Discussed with patient's daughter on phone   CONSULTS oncology   Subjective   Denies any complaints.  Objective    Physical Examination:    General-appears in no acute distress Heart-S1-S2, regular, no murmur auscultated Lungs-clear to auscultation bilaterally, no wheezing or crackles auscultated Abdomen-soft, nontender, no organomegaly Extremities-no edema in the lower extremities Neuro-alert, oriented x3, no focal deficit noted Status is:  Inpatient:             Amanda Potter   [facility] If 7PM-7AM, please contact night-coverage at [email or web address], Office  [phone]   01/21/2024, 8:14 AM  LOS: 4 days

## 2024-01-21 NOTE — Plan of Care (Signed)
   Problem: Education: Goal: Knowledge of General Education information will improve Description: Including pain rating scale, medication(s)/side effects and non-pharmacologic comfort measures Outcome: Progressing   Problem: Health Behavior/Discharge Planning: Goal: Ability to manage health-related needs will improve Outcome: Progressing   Problem: Clinical Measurements: Goal: Will remain free from infection Outcome: Progressing

## 2024-01-21 NOTE — Progress Notes (Signed)
 Daily Progress Note   Patient Name: Amanda Potter       Date: 01/21/2024 DOB: [birth year]  Age: 78 y.o. MRN#: [id number] Attending Physician: Ozell Blunt, MD Primary Care Physician: Lory Rough., PA-C Admit Date: 01/17/2024 Length of Stay: 4 days  Reason for Consultation/Follow-up: Establishing goals of care  Subjective:   CC: Patient laying in bed noting that she still feels poorly overall.  Following up regarding complex medical decision making.  Subjective:  At time of EMR review in past 24 hours patient has received as needed oxycodone  2.5 mg x 2 doses.  Presented to bedside to see patient.  Patient laying in bed, no visitors present at bedside.  Again introduced myself in the room of the palliative medicine team.  Patient remembers talking to this provider from prior interactions.  Able to inquire about symptom burden today.  Patient notes that she still has some shortness of breath though currently feels all right in terms of this.  She notes that overall she feels poorly.  Spent time providing emotional support via active listening.  With permission, able to discuss patient's advance care planning documentation on file.  Patient has named daughter, Amanda Potter, as HCPOA.  Also listed are primary alternate and secondary alternate. Inquired with patient if she had further discussed CODE STATUS with her children since she has named them as her medical decision makers if she would be unable to.  Patient noted that she has tried to have discussions with family though she describes interesting "dynamics".  Patient stated that when she told her children of her cancer diagnosis, it would like a slap in the face to them and she feels they are still trying to process that.  She noted that there is never really a great time to talk about difficult subjects.  Acknowledged this and noted that is why medical teams would be happy to assist with these discussions if desired.  Patient stated that she  did not want me to call her daughter or family at this time to discuss things as she did not want to worry them.  Acknowledged this.  Noted importance of these discussions moving forward though has concerned that with metastatic cancer, if patient were sick enough her heart were to stop or she were to stop breathing intervention such as cardiac and respiratory resuscitation would not lead to quality of life outcomes.  I am very worried children would have to make a decision after patient is on life support about potentially palliative extubation.  Patient acknowledged difficulties with that decision as well.  Spent time providing support and answering questions as able.  Encouraged patient to speak to her children about her wishes regarding life support and also noted that palliative medicine team continue to follow along and could assist with this if she so desired.  Voiced appreciation for letting me visit with her today.  All questions answered at that time.  Noted palliative medicine team continue to follow with patient's medical journey.  Objective:   Vital Signs:  BP (!) 141/77 (BP Location: Right Arm)   Pulse 99   Temp 97.9 F (36.6 C) (Oral)   Resp 18   Ht 5\' 6"  (1.676 m)   Wt 63.5 kg   SpO2 98%   BMI 22.60 kg/m   Physical Exam: General: NAD, awake, ill-appearing, frail Cardiovascular: RRR Respiratory: increased work of breathing noted, coughing noted Neuro: Awake, interactive  Imaging: I personally reviewed recent imaging.   Assessment & Plan:  Assessment: Patient is an 78 year old female with a past medical history of stage IV non-small cell lung cancer on palliative chemotherapy, hypertension, and hyperlipidemia who was admitted on 01/17/2024 for management of neutropenia and severe weakness.  Upon admission, patient found to have sepsis in setting of neutropenic fever.  Unknown source of sepsis though patient having frequent diarrhea.  Patient on broad-spectrum antibiotics.   Patient also receiving management for electrolyte abnormalities.  Oncology consulted for recommendations.  Palliative medicine team consulted to assist with complex medical decision making.  Recommendations/Plan: # Complex medical decision making/goals of care: Patient admitted for management of sepsis in setting of severe neutropenia.  Patient had recently received chemotherapy for stage IV non-small cell lung cancer.                - Discussed care with patient today as detailed above in HPI.  No family present at bedside during visit.  Again attempted to discuss wishes regarding CODE STATUS with patient.  Patient noted difficulties in discussing this with her family.  Noted palliative medicine team could assist with this discussions though patient did not want this provider to call her family at this time.  Expressed concern that with patient's known metastatic cancer, if patient were to be sick enough that her heart were to stop or she would stop breathing, interventions such as cardiac and respiratory resuscitation would not lead to good quality of life outcomes and would lead to her children having to make difficult decisions on her behalf without have having these discussions ahead of time.  Patient acknowledged this.  Encourage patient to consider this further.  Noted palliative medicine team to continue to engage in conversations as able and appropriate.                - Patient has ACP documentation already completed on file.  Patient named HCPOA Amanda Potter, primary alternate Amanda Potter, and secondary alternant Amanda Potter, Amanda Potter.                -  Code Status: Full Code    # Symptom management Patient is receiving these palliative interventions for symptom management with an intent to improve quality of life.                 - Pain/shortness of breath, in setting of stage IV non-small cell lung cancer Patient noted to have shortness of breath during visit.                               -  Continue oxycodone  2.5 mg every 4 hours as needed.  Noted can increase to 5 mg if feels dose is not providing relief.   # Psycho-social/Spiritual Support:  - Support System: Daughter, son   Thank you for allowing the palliative care team to participate in the care Amanda Potter.  Amanda Libel, DO Palliative Care Provider PMT # [phone]  If patient remains symptomatic despite maximum doses, please call PMT at [phone] between 0700 and 1900. Outside of these hours, please call attending, as PMT does not have night coverage.  Personally spent 35 minutes in patient care including extensive chart review (labs, imaging, progress/consult notes, vital signs), medically appropraite exam, discussed with treatment team, education to patient, family, and staff, documenting clinical information, medication review and management, coordination of care, and available advanced directive documents.

## 2024-01-21 NOTE — Plan of Care (Signed)

## 2024-01-22 ENCOUNTER — Inpatient Hospital Stay (HOSPITAL_COMMUNITY)

## 2024-01-22 ENCOUNTER — Encounter (HOSPITAL_COMMUNITY): Payer: Self-pay | Admitting: Internal Medicine

## 2024-01-22 ENCOUNTER — Ambulatory Visit (HOSPITAL_COMMUNITY)

## 2024-01-22 ENCOUNTER — Telehealth: Payer: Self-pay | Admitting: Medical Oncology

## 2024-01-22 DIAGNOSIS — Z7189 Other specified counseling: Secondary | ICD-10-CM | POA: Diagnosis not present

## 2024-01-22 DIAGNOSIS — R5081 Fever presenting with conditions classified elsewhere: Secondary | ICD-10-CM | POA: Diagnosis not present

## 2024-01-22 DIAGNOSIS — B957 Other staphylococcus as the cause of diseases classified elsewhere: Secondary | ICD-10-CM

## 2024-01-22 DIAGNOSIS — R4589 Other symptoms and signs involving emotional state: Secondary | ICD-10-CM

## 2024-01-22 DIAGNOSIS — C3491 Malignant neoplasm of unspecified part of right bronchus or lung: Secondary | ICD-10-CM | POA: Diagnosis not present

## 2024-01-22 DIAGNOSIS — Z79899 Other long term (current) drug therapy: Secondary | ICD-10-CM | POA: Diagnosis not present

## 2024-01-22 DIAGNOSIS — D709 Neutropenia, unspecified: Secondary | ICD-10-CM | POA: Diagnosis not present

## 2024-01-22 DIAGNOSIS — Z515 Encounter for palliative care: Secondary | ICD-10-CM | POA: Diagnosis not present

## 2024-01-22 LAB — CBC WITH DIFFERENTIAL/PLATELET
Abs Immature Granulocytes: 0.01 10*3/uL (ref 0.00–0.07)
Basophils Absolute: 0 10*3/uL (ref 0.0–0.1)
Basophils Relative: 0 %
Eosinophils Absolute: 0 10*3/uL (ref 0.0–0.5)
Eosinophils Relative: 0 %
HCT: 22.9 % — ABNORMAL LOW (ref 36.0–46.0)
Hemoglobin: 7.5 g/dL — ABNORMAL LOW (ref 12.0–15.0)
Immature Granulocytes: 2 %
Lymphocytes Relative: 81 %
Lymphs Abs: 0.4 10*3/uL — ABNORMAL LOW (ref 0.7–4.0)
MCH: 30.6 pg (ref 26.0–34.0)
MCHC: 32.8 g/dL (ref 30.0–36.0)
MCV: 93.5 fL (ref 80.0–100.0)
Monocytes Absolute: 0.1 10*3/uL (ref 0.1–1.0)
Monocytes Relative: 15 %
Neutro Abs: 0 10*3/uL — CL (ref 1.7–7.7)
Neutrophils Relative %: 2 %
Platelets: 16 10*3/uL — CL (ref 150–400)
RBC: 2.45 MIL/uL — ABNORMAL LOW (ref 3.87–5.11)
RDW: 13.7 % (ref 11.5–15.5)
WBC: 0.5 10*3/uL — CL (ref 4.0–10.5)
nRBC: 0 % (ref 0.0–0.2)

## 2024-01-22 LAB — CULTURE, BLOOD (ROUTINE X 2): Culture: NO GROWTH

## 2024-01-22 LAB — COMPREHENSIVE METABOLIC PANEL WITH GFR
ALT: 25 U/L (ref 0–44)
AST: 31 U/L (ref 15–41)
Albumin: 2 g/dL — ABNORMAL LOW (ref 3.5–5.0)
Alkaline Phosphatase: 286 U/L — ABNORMAL HIGH (ref 38–126)
Anion gap: 9 (ref 5–15)
BUN: 9 mg/dL (ref 8–23)
CO2: 28 mmol/L (ref 22–32)
Calcium: 8.2 mg/dL — ABNORMAL LOW (ref 8.9–10.3)
Chloride: 97 mmol/L — ABNORMAL LOW (ref 98–111)
Creatinine, Ser: 0.64 mg/dL (ref 0.44–1.00)
GFR, Estimated: >60 mL/min (ref >60)
Glucose, Bld: 102 mg/dL — ABNORMAL HIGH (ref 70–99)
Potassium: 2.3 mmol/L — CL (ref 3.5–5.1)
Sodium: 134 mmol/L — ABNORMAL LOW (ref 135–145)
Total Bilirubin: 1 mg/dL (ref 0.0–1.2)
Total Protein: 5.9 g/dL — ABNORMAL LOW (ref 6.5–8.1)

## 2024-01-22 LAB — TYPE AND SCREEN
ABO/RH(D): A POS
Antibody Screen: NEGATIVE

## 2024-01-22 MED ORDER — IOHEXOL 300 MG/ML  SOLN
100.0000 mL | Freq: Once | INTRAMUSCULAR | Status: AC | PRN
  Administered 2024-01-22: 100 mL via INTRAVENOUS

## 2024-01-22 MED ORDER — SODIUM CHLORIDE 0.9% IV SOLUTION: Freq: Once | INTRAVENOUS | Status: DC

## 2024-01-22 MED ORDER — POTASSIUM CHLORIDE 10 MEQ/100ML IV SOLN
10.0000 meq | INTRAVENOUS | Status: AC
  Administered 2024-01-22: 10 meq via INTRAVENOUS
  Filled 2024-01-22: qty 100

## 2024-01-22 MED ORDER — IOHEXOL 9 MG/ML PO SOLN
ORAL | Status: AC
  Filled 2024-01-22: qty 1000

## 2024-01-22 MED ORDER — POTASSIUM CHLORIDE 20 MEQ PO PACK
40.0000 meq | PACK | Freq: Two times a day (BID) | ORAL | Status: DC
Start: 2024-01-22 — End: 2024-01-24
  Filled 2024-01-22 (×2): qty 2

## 2024-01-22 MED ORDER — IOHEXOL 9 MG/ML PO SOLN: 500.0000 mL | ORAL | Status: AC

## 2024-01-22 NOTE — Progress Notes (Signed)
   01/22/24 1538  Spiritual Encounters  Type of Visit Attempt (pt unavailable)  Reason for visit Routine spiritual support  OnCall Visit No   Attempted visit, patient not available, no family at bedside.

## 2024-01-22 NOTE — Telephone Encounter (Signed)
 [name] stated pt is having a scan this afternoon.

## 2024-01-22 NOTE — Progress Notes (Signed)
 Patient's daughter spoke to Dr. Alejos Husband from palliative care.  Patient is DNR.  Confirmed with Dr [name].  Will place the order.

## 2024-01-22 NOTE — Plan of Care (Signed)

## 2024-01-22 NOTE — Progress Notes (Signed)
 [facility]  PROGRESS NOTE  Amanda Potter ZOX:[id number] DOB: [birth year] DOA: 01/17/2024 PCP: Lory Rough., PA-C   Brief HPI:   78 y.o. female with medical history significant for stage IV non-small cell lung cancer, hypertension, hyperlipidemia on palliative systemic chemotherapy being admitted to the hospital with neutropenia and severe weakness, concerning for acute infection.  Patient was very weak, was having cough and rattly sounding breath sounds and contact with a friend with viral URI so daughter brought the patient to the ED. In the ED: Found to have tachycardia, leukopenia, pancytopenia, hyponatremia 125 elevated lactic acid.Blood cultures sent placed on empiric antibiotics.  RSV COVID influenza negative Cxr> chronic bronchial changes RLL,LLL    Assessment/Plan:   Severe Sepsis POA Neutropenic fever Staph hemolyticus bacteremia Patient with neutropenia on systemic palliative chemo for stage IV NSCLCA. Sepsis etiology not clear at this time>BCx: 1/4 bottles positive for GPC, BCID shows Staphylococcus species.  -Single set of blood cultures positive for Staphylococcus hemolyticus, likely contamination -Discussed with ID, discontinued  vancomycin  and Flagyl  -ID has been consulted. -Patient currently on cefepime  -Stool for C. difficile negative for PCR Continue daily Granix  per heme-onc.     Hyponatremia: -Sodium has improved to 134 Likely multifactorial in the setting of hypovolemia, or potentially SIADH. Note that on her previous admission, she had severe hyponatremia requiring ICU admission, and salt tablets, etc. Sodium is improving with salt tablets 2 g p.o. 3 times daily -Will discontinue salt tablets at this time    Stage IV NSCLCA on chemotherapy under the care of Dr. Marguerita Shih: Dr. Marguerita Shih /oncology team is following.  Palliative care has been consulted.She is full code   Pancytopenia Likely due to chemotherapy.  Continue Granix , monitor hemoglobin  platelet WBC count transfuse if Hb less than 7  Hypothyroidism: initially placed on methimazole , but has discontinued this,will continue home dose Synthroid    Hyperlipidemia cont Zocor    GERD: Cont Protonix    Hypokalemia: Replace and follow potassium level in a.m.   HLD: Continue her statin   Goals of care: Patient is stage IV cancer elderly, full code, overall prognosis guarded She wishes to be full code.  Palliative care has been consulted.        Medications     aspirin  EC  81 mg Oral Q1200   cyanocobalamin   1,000 mcg Oral Daily   feeding supplement  237 mL Oral TID BM   filgrastim  (NIVESTYM ) SQ  300 mcg Subcutaneous Daily   folic acid   1 mg Oral Daily   iohexol   500 mL Oral Q1H   levothyroxine   50 mcg Oral Q0600   pantoprazole   40 mg Oral Daily   potassium chloride   40 mEq Oral BID   simvastatin   20 mg Oral QHS   sodium chloride   2 g Oral TID WC     Data Reviewed:   CBG:  No results for input(s): "GLUCAP" in the last 168 hours.  SpO2: 93 % O2 Flow Rate (L/min): 2 L/min    Vitals:   01/21/24 0502 01/21/24 1500 01/21/24 2125 01/22/24 0614  BP: (!) 141/77 138/88 (!) 149/80 (!) 147/72  Pulse: 99 96 (!) 101 (!) 104  Resp:  16 20 20   Temp: 97.9 F (36.6 C) 98.2 F (36.8 C) 98.8 F (37.1 C) 99.9 F (37.7 C)  TempSrc: Oral Oral Oral Oral  SpO2: 98% 96% 93% 93%  Weight:      Height:          Data Reviewed:  Basic Metabolic Panel:  Recent Labs  Lab 01/18/24 1450 01/19/24 0034 01/20/24 0348 01/21/24 0323 01/22/24 0355  NA 124* 127* 129* 130* 134*  K 3.1* 3.3* 2.8* 2.8* 2.3*  CL 92* 95* 94* 96* 97*  CO2 23 22 25 25 28   GLUCOSE 104* 89 84 106* 102*  BUN 16 15 10 9 9   CREATININE 0.74 0.66 0.54 0.50 0.64  CALCIUM 7.8* 8.1* 8.2* 8.0* 8.2*  MG  --   --  1.7  --   --     CBC: Recent Labs  Lab 01/18/24 1257 01/19/24 0034 01/20/24 0348 01/21/24 0323 01/22/24 0355  WBC 0.3* 0.3* 0.4* 0.5* 0.5*  NEUTROABS 0.0* 0.0* 0.0* 0.0* 0.0*  HGB  7.8* 7.4* 8.2* 7.5* 7.5*  HCT 22.5* 22.1* 25.1* 23.0* 22.9*  MCV 91.1 93.2 95.1 94.3 93.5  PLT 97* 75* PLATELET CLUMPS NOTED ON SMEAR, COUNT APPEARS DECREASED 30* 16*    LFT Recent Labs  Lab 01/18/24 0335 01/19/24 0034 01/20/24 0348 01/21/24 0323 01/22/24 0355  AST 43* 43* 35 28 31  ALT 46* 38 33 26 25  ALKPHOS 511* 399* 344* 287* 286*  BILITOT 1.1 0.7 1.0 1.0 1.0  PROT 5.9* 5.5* 5.8* 5.5* 5.9*  ALBUMIN 2.4* 2.2* 2.2* 2.0* 2.0*     Antibiotics: Anti-infectives (From admission, onward)    Start     Dose/Rate Route Frequency Ordered Stop   01/18/24 0500  metroNIDAZOLE  (FLAGYL ) IVPB 500 mg  Status:  Discontinued        500 mg 100 mL/hr over 60 Minutes Intravenous Every 12 hours 01/18/24 0158 01/21/24 1538   01/18/24 0400  vancomycin  (VANCOCIN ) IVPB 1000 mg/200 mL premix  Status:  Discontinued        1,000 mg 200 mL/hr over 60 Minutes Intravenous Every 24 hours 01/18/24 0158 01/21/24 1538   01/18/24 0300  ceFEPIme  (MAXIPIME ) 2 g in sodium chloride  0.9 % 100 mL IVPB        2 g 200 mL/hr over 30 Minutes Intravenous Every 12 hours 01/18/24 0158     01/17/24 1400  ceFEPIme  (MAXIPIME ) 2 g in sodium chloride  0.9 % 100 mL IVPB        2 g 200 mL/hr over 30 Minutes Intravenous  Once 01/17/24 1357 01/17/24 1621   01/17/24 1400  metroNIDAZOLE  (FLAGYL ) IVPB 500 mg        500 mg 100 mL/hr over 60 Minutes Intravenous  Once 01/17/24 1357 01/17/24 1736   01/17/24 1400  vancomycin  (VANCOCIN ) IVPB 1000 mg/200 mL premix        1,000 mg 200 mL/hr over 60 Minutes Intravenous  Once 01/17/24 1357 01/17/24 1751        DVT prophylaxis: SCDs  Code Status: Full code  Family Communication: Discussed with patient's daughter at bedside   CONSULTS oncology   Subjective   Continues to have poor p.o. intake.  Objective    Physical Examination:  General-appears in no acute distress Heart-S1-S2, regular, no murmur auscultated Lungs-clear to auscultation bilaterally, no wheezing or  crackles auscultated Abdomen-soft, nontender, no organomegaly Extremities-no edema in the lower extremities Neuro-alert, oriented x3, no focal deficit noted  Status is: Inpatient:             Amanda Potter   [facility] If 7PM-7AM, please contact night-coverage at [email or web address], Office  [phone]   01/22/2024, 8:11 AM  LOS: 5 days

## 2024-01-22 NOTE — Progress Notes (Signed)
  Daily Progress Note   Patient Name: Amanda Potter       Date: 01/22/2024 DOB: Jan 07, 1946  Age: 78 y.o. MRN#: [id number] Attending Physician: Ozell Blunt, MD Primary Care Physician: Lory Rough., PA-C Admit Date: 01/17/2024 Length of Stay: 5 days  Reason for Consultation/Follow-up: Establishing goals of care  Subjective:   CC: Patient laying in bed noting that she still feels poorly overall.  Following up regarding complex medical decision making.  Subjective:  EMR reviewed, patient resting in bed, no distress.     Objective:   Vital Signs:  BP (!) 149/97 (BP Location: Left Arm)   Pulse (!) 106   Temp 98.7 F (37.1 C)   Resp 20   Ht 5\' 6"  (1.676 m)   Wt 63.5 kg   SpO2 92%   BMI 22.60 kg/m   Physical Exam: General: NAD, awake, ill-appearing, frail Cardiovascular: RRR Respiratory: increased work of breathing noted, coughing noted Neuro: resting in bed.   Imaging: I personally reviewed recent imaging.   Assessment & Plan:   Assessment: Patient is an 78 year old female with a past medical history of stage IV non-small cell lung cancer on palliative chemotherapy, hypertension, and hyperlipidemia who was admitted on 01/17/2024 for management of neutropenia and severe weakness.  Upon admission, patient found to have sepsis in setting of neutropenic fever.  Unknown source of sepsis though patient having frequent diarrhea.  Patient on broad-spectrum antibiotics.  Patient also receiving management for electrolyte abnormalities.  Oncology consulted for recommendations.  Palliative medicine team consulted to assist with complex medical decision making.  Recommendations/Plan: # Complex medical decision making/goals of care: Patient admitted for management of sepsis in setting of severe neutropenia.  Patient had recently received chemotherapy for stage IV non-small cell lung cancer.                -full code.                 - Patient has ACP documentation already completed  on file.  Patient named HCPOA [name], primary alternate [name], and secondary alternant Gerardine Knock, Marieta Shorten.                -  Code Status: Full Code    # Symptom management Patient is receiving these palliative interventions for symptom management with an intent to improve quality of life.                 - Pain/shortness of breath, in setting of stage IV non-small cell lung cancer Patient noted to have shortness of breath during visit.                               - Continue oxycodone  2.5 mg every 4 hours as needed.  Noted can increase to 5 mg if feels dose is not providing relief.   # Psycho-social/Spiritual Support:  - Support System: Daughter, son   Thank you for allowing the palliative care team to participate in the care Amanda Potter. Low MDM [name] MD.  Palliative Care Provider PMT # [phone]  If patient remains symptomatic despite maximum doses, please call PMT at [phone] between 0700 and 1900. Outside of these hours, please call attending, as PMT does not have night coverage.

## 2024-01-22 NOTE — Consult Note (Signed)
 [facility]    Date of Admission:  01/17/2024     Reason for Consult:     Referring Provider: Daivd Dub     Lines:  Peripheral iv's  Abx: 4/24-c cefepime   4/24-28 vanc/metronidazole         Assessment: 78 yo female with stage 4 non-small cell lung cancer on palliative systemic chemo (last cycle 2 weeks prior), admitted with weakness and bcx staph hemolyticus   No source for seeding; bcx 1 of 2 set positive only  She gets ftt picture/weakness after each cycle of chemo  Only low grade fever x1 this admission  I do not suspect any active infection.  Plan: Can stop all abx Neutropenic precaution Primary team getting body ct scan -- will review Discussed with primary team      ------------------------------------------------ Principal Problem:   Sepsis (HCC) Active Problems:   Palliative care by specialist   Goals of care, counseling/discussion   Complex care coordination   Medication management   Chemotherapy-induced neutropenia (HCC)   NSCLC of right lung (HCC)   Counseling and coordination of care   Shortness of breath   Need for emotional support    HPI: Amanda Potter is a 78 y.o. female with stage 4 non-small cell lung cancer on palliative systemic chemo (last cycle 2 weeks prior), admitted with weakness and bcx staph hemolyticus  Her daughter was noticing generalized weakness and rattling sound in her lungs so brought her in   No focal complaint Chemo 2 weeks prior [facility] she feels like this with previous chemo too (weak/malaise/poor appetite/nausea)  Denies abd pain/diarrhea/vomiting/headache Denies cough/sob Denies joint pain, muscle ache  This admission tmax once 100.5 on presentation Neutropenic wbc 0.3--> 0.5; received filgrastim   Cxr bilateral basilar ateclectasis Bcx 1 of 2 set CoNs bsAbx   Other labs elevated alkphos, hyponatremia, hypokalemia  Reviewed onc note: [facility] 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 10/30/23 start of palliative chemo alogn with pembrolizumab  Lft up suspect due to pemroblizumab and that is being hold   No port   Family History  Problem Relation Age of Onset   Hearing loss Mother    Diabetes Mother    Hyperlipidemia Mother    Hypertension Mother    Heart disease Father     Social History   Tobacco Use   Smoking status: Never   Smokeless tobacco: Never  Vaping Use   Vaping status: Never Used  Substance Use Topics   Alcohol use: No   Drug use: No    Allergies  Allergen Reactions   Latex Rash   Tape Rash    Glue on tape    Review of Systems: ROS All Other ROS was negative, except mentioned above   Past Medical History:  Diagnosis Date   Deviated nasal septum    Environmental allergies    GERD (gastroesophageal reflux disease)    Hyperlipidemia    Hypertension    Perforation of colon (HCC) 2006   Following colonoscopy   Sinusitis, acute        Scheduled Meds:  sodium chloride    Intravenous Once   aspirin  EC  81 mg Oral Q1200   cyanocobalamin   1,000 mcg Oral Daily   feeding supplement  237 mL Oral TID BM   filgrastim  (NIVESTYM ) SQ  300 mcg Subcutaneous Daily   folic acid   1 mg Oral Daily   levothyroxine   50 mcg Oral Q0600   pantoprazole   40 mg Oral  Daily   potassium chloride   40 mEq Oral BID   simvastatin   20 mg Oral QHS   Continuous Infusions:  ceFEPime  (MAXIPIME ) IV 2 g (01/22/24 0247)   PRN Meds:.acetaminophen  **OR** acetaminophen , albuterol , guaiFENesin -dextromethorphan , iohexol , ondansetron  **OR** ondansetron  (ZOFRAN ) IV, mouth rinse, oxyCODONE    OBJECTIVE: Blood pressure 138/76, pulse (!) 57, temperature 98.8 F (37.1 C), resp. rate 20, height 5\' 6"  (1.676 m), weight 63.5 kg, SpO2 92%.  Physical Exam  General/constitutional: chronically ill appearing; lethargy noted; conversant; weak voice; appropriate HEENT: Normocephalic, PER, Conj Clear, EOMI, Oropharynx clear Neck supple CV: rrr no  mrg Lungs: rhonchi/upper airway sound; normal respiratory effort Abd: Soft, Nontender Ext: no edema Skin: No Rash Neuro: nonfocal -- generalized weakness MSK: no peripheral joint swelling/tenderness/warmth; back spines nontender   Lab Results Lab Results  Component Value Date   WBC 0.5 (LL) 01/22/2024   HGB 7.5 (L) 01/22/2024   HCT 22.9 (L) 01/22/2024   MCV 93.5 01/22/2024   PLT 16 (LL) 01/22/2024    Lab Results  Component Value Date   CREATININE 0.64 01/22/2024   BUN 9 01/22/2024   NA 134 (L) 01/22/2024   K 2.3 (LL) 01/22/2024   CL 97 (L) 01/22/2024   CO2 28 01/22/2024    Lab Results  Component Value Date   ALT 25 01/22/2024   AST 31 01/22/2024   ALKPHOS 286 (H) 01/22/2024   BILITOT 1.0 01/22/2024      Microbiology: Recent Results (from the past 240 hours)  Resp panel by RT-PCR (RSV, Flu A&B, Covid) Anterior Nasal Swab     Status: None   Collection Time: 01/17/24 12:45 PM   Specimen: Anterior Nasal Swab  Result Value Ref Range Status   SARS Coronavirus 2 by RT PCR NEGATIVE NEGATIVE Final    Comment: (NOTE) SARS-CoV-2 target nucleic acids are NOT DETECTED.  The SARS-CoV-2 RNA is generally detectable in upper respiratory specimens during the acute phase of infection. The lowest concentration of SARS-CoV-2 viral copies this assay can detect is 138 copies/mL. A negative result does not preclude SARS-Cov-2 infection and should not be used as the sole basis for treatment or other patient management decisions. A negative result may occur with  improper specimen collection/handling, submission of specimen other than nasopharyngeal swab, presence of viral mutation(s) within the areas targeted by this assay, and inadequate number of viral copies(<138 copies/mL). A negative result must be combined with clinical observations, patient history, and epidemiological information. The expected result is Negative.  Fact Sheet for Patients:   BloggerCourse.com  Fact Sheet for Healthcare Providers:  SeriousBroker.it  This test is no t yet approved or cleared by the [place]  FDA and  has been authorized for detection and/or diagnosis of SARS-CoV-2 by FDA under an Emergency Use Authorization (EUA). This EUA will remain  in effect (meaning this test can be used) for the duration of the COVID-19 declaration under Section 564(b)(1) of the Act, 21 U.S.C.section 360bbb-3(b)(1), unless the authorization is terminated  or revoked sooner.       Influenza A by PCR NEGATIVE NEGATIVE Final   Influenza B by PCR NEGATIVE NEGATIVE Final    Comment: (NOTE) The Xpert Xpress SARS-CoV-2/FLU/RSV plus assay is intended as an aid in the diagnosis of influenza from Nasopharyngeal swab specimens and should not be used as a sole basis for treatment. Nasal washings and aspirates are unacceptable for Xpert Xpress SARS-CoV-2/FLU/RSV testing.  Fact Sheet for Patients: BloggerCourse.com  Fact Sheet for Healthcare Providers: SeriousBroker.it  This test is not  yet approved or cleared by the [place]  FDA and has been authorized for detection and/or diagnosis of SARS-CoV-2 by FDA under an Emergency Use Authorization (EUA). This EUA will remain in effect (meaning this test can be used) for the duration of the COVID-19 declaration under Section 564(b)(1) of the Act, 21 U.S.C. section 360bbb-3(b)(1), unless the authorization is terminated or revoked.     Resp Syncytial Virus by PCR NEGATIVE NEGATIVE Final    Comment: (NOTE) Fact Sheet for Patients: BloggerCourse.com  Fact Sheet for Healthcare Providers: SeriousBroker.it  This test is not yet approved or cleared by the [place]  FDA and has been authorized for detection and/or diagnosis of SARS-CoV-2 by FDA under an Emergency Use  Authorization (EUA). This EUA will remain in effect (meaning this test can be used) for the duration of the COVID-19 declaration under Section 564(b)(1) of the Act, 21 U.S.C. section 360bbb-3(b)(1), unless the authorization is terminated or revoked.  Performed at [facility], [address] [place], [place] [address]   Blood culture (routine x 2)     Status: None   Collection Time: 01/17/24  2:50 PM   Specimen: BLOOD  Result Value Ref Range Status   Specimen Description   Final    BLOOD RIGHT ANTECUBITAL Performed at [facility], [address] [place], [place] [address]    Special Requests   Final    BOTTLES DRAWN AEROBIC AND ANAEROBIC Blood Culture results may not be optimal due to an inadequate volume of blood received in culture bottles Performed at [facility], [address], [place], [place] [address]    Culture   Final    NO GROWTH 5 DAYS Performed at [facility], [address], [place], [place] [address]    Report Status 01/22/2024 FINAL  Final  Blood culture (routine x 2)     Status: Abnormal   Collection Time: 01/17/24  2:56 PM   Specimen: BLOOD LEFT HAND  Result Value Ref Range Status   Specimen Description   Final    BLOOD LEFT HAND Performed at [facility], [address] [place], [place] [address]    Special Requests   Final    BOTTLES DRAWN AEROBIC AND ANAEROBIC Blood Culture results may not be optimal due to an inadequate volume of blood received in culture bottles Performed at [facility], [address] [place], [place] [address]    Culture  Setup Time   Final    GRAM POSITIVE COCCI ANAEROBIC BOTTLE ONLY CRITICAL RESULT CALLED TO, READ BACK BY AND VERIFIED WITH: PHARMD DREW WOFFORD [id number] AT 1100 BY EC    Culture (A)  Final    STAPHYLOCOCCUS HAEMOLYTICUS THE SIGNIFICANCE OF ISOLATING THIS ORGANISM FROM A SINGLE SET OF BLOOD CULTURES WHEN MULTIPLE SETS  ARE DRAWN IS UNCERTAIN. PLEASE NOTIFY THE MICROBIOLOGY DEPARTMENT WITHIN ONE WEEK IF SPECIATION AND SENSITIVITIES ARE REQUIRED. Performed at [facility], [address], [place], [place] [address]    Report Status 01/20/2024 FINAL  Final  Blood Culture ID Panel (Reflexed)     Status: Abnormal   Collection Time: 01/17/24  2:56 PM  Result Value Ref Range Status   Enterococcus faecalis NOT DETECTED NOT DETECTED Final   Enterococcus Faecium NOT DETECTED NOT DETECTED Final   Listeria monocytogenes NOT DETECTED NOT DETECTED Final   Staphylococcus species DETECTED (A) NOT DETECTED Final    Comment: CRITICAL RESULT CALLED TO, READ BACK BY AND  VERIFIED WITH: PHARMD DREW WOFFORD [id number] AT 1100 BY EC    Staphylococcus aureus (BCID) NOT DETECTED NOT DETECTED Final   Staphylococcus epidermidis NOT DETECTED NOT DETECTED Final   Staphylococcus lugdunensis NOT DETECTED NOT DETECTED Final   Streptococcus species NOT DETECTED NOT DETECTED Final   Streptococcus agalactiae NOT DETECTED NOT DETECTED Final   Streptococcus pneumoniae NOT DETECTED NOT DETECTED Final   Streptococcus pyogenes NOT DETECTED NOT DETECTED Final   A.calcoaceticus-baumannii NOT DETECTED NOT DETECTED Final   Bacteroides fragilis NOT DETECTED NOT DETECTED Final   Enterobacterales NOT DETECTED NOT DETECTED Final   Enterobacter cloacae complex NOT DETECTED NOT DETECTED Final   Escherichia coli NOT DETECTED NOT DETECTED Final   Klebsiella aerogenes NOT DETECTED NOT DETECTED Final   Klebsiella oxytoca NOT DETECTED NOT DETECTED Final   Klebsiella pneumoniae NOT DETECTED NOT DETECTED Final   Proteus species NOT DETECTED NOT DETECTED Final   Salmonella species NOT DETECTED NOT DETECTED Final   Serratia marcescens NOT DETECTED NOT DETECTED Final   Haemophilus influenzae NOT DETECTED NOT DETECTED Final   Neisseria meningitidis NOT DETECTED NOT DETECTED Final   Pseudomonas aeruginosa NOT DETECTED NOT DETECTED Final    Stenotrophomonas maltophilia NOT DETECTED NOT DETECTED Final   Candida albicans NOT DETECTED NOT DETECTED Final   Candida auris NOT DETECTED NOT DETECTED Final   Candida glabrata NOT DETECTED NOT DETECTED Final   Candida krusei NOT DETECTED NOT DETECTED Final   Candida parapsilosis NOT DETECTED NOT DETECTED Final   Candida tropicalis NOT DETECTED NOT DETECTED Final   Cryptococcus neoformans/gattii NOT DETECTED NOT DETECTED Final    Comment: Performed at [facility], [address] [place], [place] [address]  MRSA Next Gen by PCR, Nasal     Status: None   Collection Time: 01/18/24  8:42 AM   Specimen: Nasal Mucosa; Nasal Swab  Result Value Ref Range Status   MRSA by PCR Next Gen NOT DETECTED NOT DETECTED Final    Comment: (NOTE) The GeneXpert MRSA Assay (FDA approved for NASAL specimens only), is one component of a comprehensive MRSA colonization surveillance program. It is not intended to diagnose MRSA infection nor to guide or monitor treatment for MRSA infections. Test performance is not FDA approved in patients less than 21 years old. Performed at [facility], [address], [place], [place] [address]   C Difficile Quick Screen w PCR reflex     Status: None   Collection Time: 01/19/24  9:19 AM   Specimen: STOOL  Result Value Ref Range Status   C Diff antigen NEGATIVE NEGATIVE Final   C Diff toxin NEGATIVE NEGATIVE Final   C Diff interpretation No C. difficile detected.  Final    Comment: Performed at [facility], [address] [place], [place] [address]     Serology:    Imaging: If present, new imagings (plain films, ct scans, and mri) have been personally visualized and interpreted; radiology reports have been reviewed. Decision making incorporated into the Impression / Recommendations.  4/29 ct abd pelv chest in progress  4/24 cxr Chronic bronchial changes of the right lower lobe and left lower lobe retrocardiac  atelectasis.     Jamesetta Mcbride, MD [facility] [phone] pager    01/22/2024, 3:26 PM

## 2024-01-22 NOTE — Telephone Encounter (Signed)
 Dtr asking who to talk to about DNR status. She is concerned and has questions

## 2024-01-23 ENCOUNTER — Ambulatory Visit: Payer: Self-pay

## 2024-01-23 ENCOUNTER — Inpatient Hospital Stay: Admitting: Dietician

## 2024-01-23 ENCOUNTER — Other Ambulatory Visit

## 2024-01-23 ENCOUNTER — Inpatient Hospital Stay

## 2024-01-23 ENCOUNTER — Ambulatory Visit: Payer: Self-pay | Admitting: Internal Medicine

## 2024-01-23 ENCOUNTER — Encounter: Admitting: Dietician

## 2024-01-23 DIAGNOSIS — C3491 Malignant neoplasm of unspecified part of right bronchus or lung: Secondary | ICD-10-CM | POA: Diagnosis not present

## 2024-01-23 DIAGNOSIS — R6510 Systemic inflammatory response syndrome (SIRS) of non-infectious origin without acute organ dysfunction: Secondary | ICD-10-CM | POA: Diagnosis not present

## 2024-01-23 DIAGNOSIS — Z515 Encounter for palliative care: Secondary | ICD-10-CM | POA: Diagnosis not present

## 2024-01-23 DIAGNOSIS — A419 Sepsis, unspecified organism: Secondary | ICD-10-CM | POA: Diagnosis not present

## 2024-01-23 DIAGNOSIS — R6520 Severe sepsis without septic shock: Secondary | ICD-10-CM | POA: Diagnosis not present

## 2024-01-23 LAB — CBC WITH DIFFERENTIAL/PLATELET
Abs Immature Granulocytes: 0.01 10*3/uL (ref 0.00–0.07)
Basophils Absolute: 0 10*3/uL (ref 0.0–0.1)
Basophils Relative: 0 %
Eosinophils Absolute: 0 10*3/uL (ref 0.0–0.5)
Eosinophils Relative: 0 %
HCT: 21.5 % — ABNORMAL LOW (ref 36.0–46.0)
Hemoglobin: 7.6 g/dL — ABNORMAL LOW (ref 12.0–15.0)
Immature Granulocytes: 2 %
Lymphocytes Relative: 92 %
Lymphs Abs: 0.4 10*3/uL — ABNORMAL LOW (ref 0.7–4.0)
MCH: 31.9 pg (ref 26.0–34.0)
MCHC: 35.3 g/dL (ref 30.0–36.0)
MCV: 90.3 fL (ref 80.0–100.0)
Monocytes Absolute: 0 10*3/uL — ABNORMAL LOW (ref 0.1–1.0)
Monocytes Relative: 3 %
Neutro Abs: 0 10*3/uL — CL (ref 1.7–7.7)
Neutrophils Relative %: 3 %
Platelets: 34 10*3/uL — ABNORMAL LOW (ref 150–400)
RBC: 2.38 MIL/uL — ABNORMAL LOW (ref 3.87–5.11)
RDW: 13.5 % (ref 11.5–15.5)
WBC: 0.4 10*3/uL — CL (ref 4.0–10.5)
nRBC: 0 % (ref 0.0–0.2)

## 2024-01-23 LAB — BPAM PLATELET PHERESIS
Blood Product Expiration Date: 202505012359
ISSUE DATE / TIME: 202504291316
Unit Type and Rh: 5100

## 2024-01-23 LAB — COMPREHENSIVE METABOLIC PANEL WITH GFR
ALT: 24 U/L (ref 0–44)
AST: 26 U/L (ref 15–41)
Albumin: 2.2 g/dL — ABNORMAL LOW (ref 3.5–5.0)
Alkaline Phosphatase: 267 U/L — ABNORMAL HIGH (ref 38–126)
Anion gap: 15 (ref 5–15)
BUN: 13 mg/dL (ref 8–23)
CO2: 26 mmol/L (ref 22–32)
Calcium: 8.1 mg/dL — ABNORMAL LOW (ref 8.9–10.3)
Chloride: 91 mmol/L — ABNORMAL LOW (ref 98–111)
Creatinine, Ser: 0.49 mg/dL (ref 0.44–1.00)
GFR, Estimated: >60 mL/min (ref >60)
Glucose, Bld: 96 mg/dL (ref 70–99)
Potassium: 2.2 mmol/L — CL (ref 3.5–5.1)
Sodium: 132 mmol/L — ABNORMAL LOW (ref 135–145)
Total Bilirubin: 1.2 mg/dL (ref 0.0–1.2)
Total Protein: 6.6 g/dL (ref 6.5–8.1)

## 2024-01-23 LAB — PREPARE PLATELET PHERESIS: Unit division: 0

## 2024-01-23 MED ORDER — POTASSIUM CHLORIDE 20 MEQ PO PACK
40.0000 meq | PACK | Freq: Three times a day (TID) | ORAL | Status: DC
  Administered 2024-01-23 – 2024-01-25 (×5): 40 meq via ORAL
  Filled 2024-01-23 (×7): qty 2

## 2024-01-23 NOTE — Progress Notes (Addendum)
  Daily Progress Note   Patient Name: Amanda Potter       Date: 01/23/2024 DOB: Jun 30, 1946  Age: 78 y.o. MRN#: [id number] Attending Physician: Audria Leather, MD Primary Care Physician: Lory Rough., PA-C Admit Date: 01/17/2024 Length of Stay: 6 days  Reason for Consultation/Follow-up: Establishing goals of care  Subjective:   CC: Patient resting in bed, awake, alert, has cough, appears chronically illlaying in bed noting that she still feels poorly overall.  Following up regarding complex medical decision making.  Subjective:  EMR reviewed, patient resting in bed, no distress.     Objective:   Vital Signs:  BP 95/76 (BP Location: Left Arm)   Pulse 83   Temp 98.4 F (36.9 C) (Oral)   Resp 20   Ht 5\' 6"  (1.676 m)   Wt 63.5 kg   SpO2 94%   BMI 22.60 kg/m   Physical Exam: General: NAD, awake, ill-appearing, frail Cardiovascular: RRR Respiratory: increased work of breathing noted, coughing noted Neuro: resting in bed.   Imaging: I personally reviewed recent imaging.   Assessment & Plan:   Assessment: Patient is an 78 year old female with a past medical history of stage IV non-small cell lung cancer on palliative chemotherapy, hypertension, and hyperlipidemia who was admitted on 01/17/2024 for management of neutropenia and severe weakness.  Upon admission, patient found to have sepsis in setting of neutropenic fever.  Unknown source of sepsis though patient having frequent diarrhea.  Patient on broad-spectrum antibiotics.  Patient also receiving management for electrolyte abnormalities.  Oncology consulted for recommendations.  Palliative medicine team consulted to assist with complex medical decision making.  Recommendations/Plan: # Complex medical decision making/goals of care: Patient admitted for management of sepsis in setting of severe neutropenia.  Patient had recently received chemotherapy for stage IV non-small cell lung cancer.              DNR                  - Patient has ACP documentation already completed on file.  Patient named HCPOA Pamela Dillon, primary alternate [name], and secondary alternant Gerardine Knock, Marieta Shorten.                -  Code Status: DNR   # Symptom management Patient is receiving these palliative interventions for symptom management with an intent to improve quality of life.                 - Pain/shortness of breath, in setting of stage IV non-small cell lung cancer Patient noted to have cough during visit.                               - Continue oxycodone  2.5 mg every 4 hours as needed.  Noted can increase to 5 mg if feels dose is not providing relief.   # Psycho-social/Spiritual Support:  - Support System: Daughter, son Discussed with daughter Bart Born about monitoring hospital course and continuing goals of care discussions.   Thank you for allowing the palliative care team to participate in the care [name]. Mod MDM [name] MD.  Palliative Care Provider PMT # [phone]  If patient remains symptomatic despite maximum doses, please call PMT at [phone] between 0700 and 1900. Outside of these hours, please call attending, as PMT does not have night coverage.

## 2024-01-23 NOTE — Evaluation (Signed)
 Physical Therapy Evaluation Patient Details Name: Amanda Potter MRN: [id number] DOB: 06-01-1946 Today's Date: 01/23/2024  History of Present Illness  78 yo female admittedwith weakness, neutropenic fever, staph bacteremia. Hx of stage IV NSCLC-palliative chemo  Clinical Impression  Bed level eval only. Pt was agreeable to therapist assessing UE/LE ROM, strength while in bed. She declined OOB activity on today- "I just don't feel like it." Pt appears weak and tired. She also appears a bit drowsy. She was able to tell me she lives with her son. She reports she was mod ind with mobility/ADLs prior to admission. No family present during session. Will plan to follow acutely. Patient will benefit from continued inpatient follow up therapy, <3 hours/day, unless family prefers home and can provide current level of assist.         If plan is discharge home, recommend the following: A lot of help with walking and/or transfers;A lot of help with bathing/dressing/bathroom;Assistance with cooking/housework;Assist for transportation;Help with stairs or ramp for entrance   Can travel by private vehicle        Equipment Recommendations None recommended by PT  Recommendations for Other Services       Functional Status Assessment Patient has had a recent decline in their functional status and demonstrates the ability to make significant improvements in function in a reasonable and predictable amount of time.     Precautions / Restrictions Precautions Precautions: Fall Restrictions Weight Bearing Restrictions Per Provider Order: No      Mobility  Bed Mobility               General bed mobility comments: NT-pt declined sitting EOB    Transfers                        Ambulation/Gait                  Stairs            Wheelchair Mobility     Tilt Bed    Modified Rankin (Stroke Patients Only)       Balance                                              Pertinent Vitals/Pain Pain Assessment Pain Assessment: Faces Faces Pain Scale: No hurt    Home Living Family/patient expects to be discharged to:: Private residence Living Arrangements: Children Available Help at Discharge: Available 24 hours/day;Friend(s);Family Type of Home: House Home Access: Ramped entrance       Home Layout: One level Home Equipment: Shower seat;Grab bars - toilet;Cane - quad;Cane - single point;Lift chair;Wheelchair - Forensic psychologist (2 wheels);BSC/3in1      Prior Function Prior Level of Function : Independent/Modified Independent             Mobility Comments: using RW for ambulation ADLs Comments: mod ind with bathing, dressing     Extremity/Trunk Assessment   Upper Extremity Assessment Upper Extremity Assessment: Defer to OT evaluation    Lower Extremity Assessment Lower Extremity Assessment: Generalized weakness       Communication   Communication Communication: Impaired Factors Affecting Communication: Hearing impaired    Cognition Arousal: Lethargic Behavior During Therapy: Flat affect   PT - Cognitive impairments: No family/caregiver present to determine baseline  Following commands: Intact       Cueing Cueing Techniques: Verbal cues     General Comments      Exercises General Exercises - Lower Extremity Ankle Circles/Pumps: AROM, Both, 10 reps   Assessment/Plan    PT Assessment Patient needs continued PT services  PT Problem List Decreased strength;Decreased range of motion;Decreased activity tolerance;Decreased balance;Decreased mobility;Decreased knowledge of use of DME       PT Treatment Interventions DME instruction;Gait training;Functional mobility training;Therapeutic activities;Therapeutic exercise;Patient/family education;Balance training    PT Goals (Current goals can be found in the Care Plan section)  Acute Rehab PT Goals Patient Stated Goal:  none stated PT Goal Formulation: With patient Time For Goal Achievement: 02/06/24 Potential to Achieve Goals: Fair    Frequency Min 2X/week     Co-evaluation               AM-PAC PT "6 Clicks" Mobility  Outcome Measure Help needed turning from your back to your side while in a flat bed without using bedrails?: A Lot Help needed moving from lying on your back to sitting on the side of a flat bed without using bedrails?: A Lot Help needed moving to and from a bed to a chair (including a wheelchair)?: A Lot Help needed standing up from a chair using your arms (e.g., wheelchair or bedside chair)?: A Lot Help needed to walk in hospital room?: A Lot Help needed climbing 3-5 steps with a railing? : A Lot 6 Click Score: 12    End of Session   Activity Tolerance: Patient limited by fatigue;Patient tolerated treatment well Patient left: in bed;with call bell/phone within reach;with bed alarm set   PT Visit Diagnosis: Muscle weakness (generalized) (M62.81);Difficulty in walking, not elsewhere classified (R26.2)    Time: 4782-9562 PT Time Calculation (min) (ACUTE ONLY): 14 min   Charges:   PT Evaluation $PT Eval Low Complexity: 1 Low   PT General Charges $$ ACUTE PT VISIT: 1 Visit           Tanda Falter, PT Acute Rehabilitation  Office: [phone]

## 2024-01-23 NOTE — Plan of Care (Signed)

## 2024-01-23 NOTE — Progress Notes (Signed)
 PROGRESS NOTE    Amanda Potter  WGN:[id number] DOB: [birth year] DOA: 01/17/2024 PCP: Lory Rough., PA-C   Brief Narrative:  78 y.o. female with medical history significant for stage IV non-small cell lung cancer, hypertension, hyperlipidemia on palliative systemic chemotherapy presented with severe weakness, cough.  On presentation, she was tachycardic, leukopenic, pancytopenic, sodium of 125 with elevated lactic acid.  She was started on broad-spectrum antibiotics.  RSV/COVID/influenza PCR negative.  Oncology was consulted and patient was started on Granix .  ID was consulted and antibiotics were discontinued.  Assessment & Plan:   Neutropenic fever Staph hemolyticus bacteremia: Most likely contaminant Sepsis has been ruled out - Patient initially started on broad-spectrum antibiotics with concerns for neutropenic fever and sepsis.  ID has discontinued all antibiotics.  Sepsis has been ruled out.  Staph haemolyticus bacteremia is possibly considered contaminant - Currently afebrile and not spiking any temperatures.  Remains neutropenic.  Continue Granix  as per oncology till [facility] is above 1500  Hyponatremia - Improving.  132 today.  Monitor.  Off salt tablets  Hypokalemia - Replace.  Repeat a.m. labs.  Patient has refused oral potassium supplementation intermittently  Stage IV non-small cell lung cancer Pancytopenia Goals of care -Oncology following.  Outpatient follow-up with oncology.  Palliative care following.  CODE STATUS has been changed to DNR - No signs of bleeding.  Monitor platelets and hemoglobin.  Transfuse if hemoglobin is less than 7.  Patient received 1 unit of platelet transfusion on 01/22/2024.  GERD - Continue Protonix   Hyperlipidemia -continue statin  Hypothyroidism -Continue Synthroid   Physical deconditioning - PT eval  DVT prophylaxis: SCDs Code Status: DNR Family Communication: None at bedside Disposition Plan: Status is: Inpatient Remains  inpatient appropriate because: Of severity of illness  Consultants: Oncology/ID  Procedures: None  Antimicrobials:  Anti-infectives (From admission, onward)    Start     Dose/Rate Route Frequency Ordered Stop   01/18/24 0500  metroNIDAZOLE  (FLAGYL ) IVPB 500 mg  Status:  Discontinued        500 mg 100 mL/hr over 60 Minutes Intravenous Every 12 hours 01/18/24 0158 01/21/24 1538   01/18/24 0400  vancomycin  (VANCOCIN ) IVPB 1000 mg/200 mL premix  Status:  Discontinued        1,000 mg 200 mL/hr over 60 Minutes Intravenous Every 24 hours 01/18/24 0158 01/21/24 1538   01/18/24 0300  ceFEPIme  (MAXIPIME ) 2 g in sodium chloride  0.9 % 100 mL IVPB        2 g 200 mL/hr over 30 Minutes Intravenous Every 12 hours 01/18/24 0158 01/22/24 1842   01/17/24 1400  ceFEPIme  (MAXIPIME ) 2 g in sodium chloride  0.9 % 100 mL IVPB        2 g 200 mL/hr over 30 Minutes Intravenous  Once 01/17/24 1357 01/17/24 1621   01/17/24 1400  metroNIDAZOLE  (FLAGYL ) IVPB 500 mg        500 mg 100 mL/hr over 60 Minutes Intravenous  Once 01/17/24 1357 01/17/24 1736   01/17/24 1400  vancomycin  (VANCOCIN ) IVPB 1000 mg/200 mL premix        1,000 mg 200 mL/hr over 60 Minutes Intravenous  Once 01/17/24 1357 01/17/24 1751        Subjective: Patient seen and examined at bedside.  Still feels very weak.  Had fever last night.  No vomiting, chest pain or shortness of breath reported.  Objective: Vitals:   01/22/24 1547 01/22/24 2125 01/23/24 0100 01/23/24 0447  BP: (!) 154/69 (!) 144/70  130/78  Pulse: (!) 104 77  (!)  104  Resp: 18 20  20   Temp: 98.5 F (36.9 C) (!) 100.4 F (38 C) (!) 97.5 F (36.4 C) 98 F (36.7 C)  TempSrc: Oral Oral    SpO2: 94% 95%  94%  Weight:      Height:        Intake/Output Summary (Last 24 hours) at 01/23/2024 1136 Last data filed at 01/22/2024 1850 Gross per 24 hour  Intake 584.47 ml  Output 400 ml  Net 184.47 ml   Filed Weights   01/17/24 1232  Weight: 63.5 kg     Examination:  General exam: Appears calm and comfortable.  Looks chronically ill and deconditioned. Respiratory system: Bilateral decreased breath sounds at bases Cardiovascular system: S1 & S2 heard, Rate controlled Gastrointestinal system: Abdomen is nondistended, soft and nontender. Normal bowel sounds heard. Extremities: No cyanosis, clubbing, edema  Central nervous system: Alert and oriented.  Slow to respond; poor historian.  No focal neurological deficits. Moving extremities Skin: No rashes, lesions or ulcers Psychiatry: Flat affect.  Not agitated.   Data Reviewed: I have personally reviewed following labs and imaging studies  CBC: Recent Labs  Lab 01/19/24 0034 01/20/24 0348 01/21/24 0323 01/22/24 0355 01/23/24 0603  WBC 0.3* 0.4* 0.5* 0.5* 0.4*  NEUTROABS 0.0* 0.0* 0.0* 0.0* 0.0*  HGB 7.4* 8.2* 7.5* 7.5* 7.6*  HCT 22.1* 25.1* 23.0* 22.9* 21.5*  MCV 93.2 95.1 94.3 93.5 90.3  PLT 75* PLATELET CLUMPS NOTED ON SMEAR, COUNT APPEARS DECREASED 30* 16* 34*   Basic Metabolic Panel: Recent Labs  Lab 01/19/24 0034 01/20/24 0348 01/21/24 0323 01/22/24 0355 01/23/24 0415  NA 127* 129* 130* 134* 132*  K 3.3* 2.8* 2.8* 2.3* 2.2*  CL 95* 94* 96* 97* 91*  CO2 22 25 25 28 26   GLUCOSE 89 84 106* 102* 96  BUN 15 10 9 9 13   CREATININE 0.66 0.54 0.50 0.64 0.49  CALCIUM 8.1* 8.2* 8.0* 8.2* 8.1*  MG  --  1.7  --   --   --    GFR: Estimated Creatinine Clearance: 52.5 mL/min (by C-G formula based on SCr of 0.49 mg/dL). Liver Function Tests: Recent Labs  Lab 01/19/24 0034 01/20/24 0348 01/21/24 0323 01/22/24 0355 01/23/24 0415  AST 43* 35 28 31 26   ALT 38 33 26 25 24   ALKPHOS 399* 344* 287* 286* 267*  BILITOT 0.7 1.0 1.0 1.0 1.2  PROT 5.5* 5.8* 5.5* 5.9* 6.6  ALBUMIN 2.2* 2.2* 2.0* 2.0* 2.2*   No results for input(s): "LIPASE", "AMYLASE" in the last 168 hours. No results for input(s): "AMMONIA" in the last 168 hours. Coagulation Profile: Recent Labs  Lab  01/17/24 1628  INR 1.0   Cardiac Enzymes: No results for input(s): "CKTOTAL", "CKMB", "CKMBINDEX", "TROPONINI" in the last 168 hours. BNP (last 3 results) No results for input(s): "PROBNP" in the last 8760 hours. HbA1C: No results for input(s): "HGBA1C" in the last 72 hours. CBG: No results for input(s): "GLUCAP" in the last 168 hours. Lipid Profile: No results for input(s): "CHOL", "HDL", "LDLCALC", "TRIG", "CHOLHDL", "LDLDIRECT" in the last 72 hours. Thyroid  Function Tests: No results for input(s): "TSH", "T4TOTAL", "FREET4", "T3FREE", "THYROIDAB" in the last 72 hours. Anemia Panel: No results for input(s): "VITAMINB12", "FOLATE", "FERRITIN", "TIBC", "IRON", "RETICCTPCT" in the last 72 hours. Sepsis Labs: Recent Labs  Lab 01/17/24 1426 01/17/24 1628  LATICACIDVEN 3.2* 1.6    Recent Results (from the past 240 hours)  Resp panel by RT-PCR (RSV, Flu A&B, Covid) Anterior Nasal Swab  Status: None   Collection Time: 01/17/24 12:45 PM   Specimen: Anterior Nasal Swab  Result Value Ref Range Status   SARS Coronavirus 2 by RT PCR NEGATIVE NEGATIVE Final    Comment: (NOTE) SARS-CoV-2 target nucleic acids are NOT DETECTED.  The SARS-CoV-2 RNA is generally detectable in upper respiratory specimens during the acute phase of infection. The lowest concentration of SARS-CoV-2 viral copies this assay can detect is 138 copies/mL. A negative result does not preclude SARS-Cov-2 infection and should not be used as the sole basis for treatment or other patient management decisions. A negative result may occur with  improper specimen collection/handling, submission of specimen other than nasopharyngeal swab, presence of viral mutation(s) within the areas targeted by this assay, and inadequate number of viral copies(<138 copies/mL). A negative result must be combined with clinical observations, patient history, and epidemiological information. The expected result is Negative.  Fact Sheet  for Patients:  BloggerCourse.com  Fact Sheet for Healthcare Providers:  SeriousBroker.it  This test is no t yet approved or cleared by the [place]  FDA and  has been authorized for detection and/or diagnosis of SARS-CoV-2 by FDA under an Emergency Use Authorization (EUA). This EUA will remain  in effect (meaning this test can be used) for the duration of the COVID-19 declaration under Section 564(b)(1) of the Act, 21 U.S.C.section 360bbb-3(b)(1), unless the authorization is terminated  or revoked sooner.       Influenza A by PCR NEGATIVE NEGATIVE Final   Influenza B by PCR NEGATIVE NEGATIVE Final    Comment: (NOTE) The Xpert Xpress SARS-CoV-2/FLU/RSV plus assay is intended as an aid in the diagnosis of influenza from Nasopharyngeal swab specimens and should not be used as a sole basis for treatment. Nasal washings and aspirates are unacceptable for Xpert Xpress SARS-CoV-2/FLU/RSV testing.  Fact Sheet for Patients: BloggerCourse.com  Fact Sheet for Healthcare Providers: SeriousBroker.it  This test is not yet approved or cleared by the [place]  FDA and has been authorized for detection and/or diagnosis of SARS-CoV-2 by FDA under an Emergency Use Authorization (EUA). This EUA will remain in effect (meaning this test can be used) for the duration of the COVID-19 declaration under Section 564(b)(1) of the Act, 21 U.S.C. section 360bbb-3(b)(1), unless the authorization is terminated or revoked.     Resp Syncytial Virus by PCR NEGATIVE NEGATIVE Final    Comment: (NOTE) Fact Sheet for Patients: BloggerCourse.com  Fact Sheet for Healthcare Providers: SeriousBroker.it  This test is not yet approved or cleared by the [place]  FDA and has been authorized for detection and/or diagnosis of SARS-CoV-2 by FDA under an  Emergency Use Authorization (EUA). This EUA will remain in effect (meaning this test can be used) for the duration of the COVID-19 declaration under Section 564(b)(1) of the Act, 21 U.S.C. section 360bbb-3(b)(1), unless the authorization is terminated or revoked.  Performed at [facility], [address], [place], [place] [address]   Blood culture (routine x 2)     Status: None   Collection Time: 01/17/24  2:50 PM   Specimen: BLOOD  Result Value Ref Range Status   Specimen Description   Final    BLOOD RIGHT ANTECUBITAL Performed at [facility], [address], [place], [place] [address]    Special Requests   Final    BOTTLES DRAWN AEROBIC AND ANAEROBIC Blood Culture results may not be optimal due to an inadequate volume of blood received in culture bottles Performed at [facility],  [address] [place], [place] [address]    Culture   Final    NO GROWTH 5 DAYS Performed at [facility], [address] [place], [place] [address]    Report Status 01/22/2024 FINAL  Final  Blood culture (routine x 2)     Status: Abnormal   Collection Time: 01/17/24  2:56 PM   Specimen: BLOOD LEFT HAND  Result Value Ref Range Status   Specimen Description   Final    BLOOD LEFT HAND Performed at [facility], [address], [place], [place] [address]    Special Requests   Final    BOTTLES DRAWN AEROBIC AND ANAEROBIC Blood Culture results may not be optimal due to an inadequate volume of blood received in culture bottles Performed at [facility], [address] [place], [place] [address]    Culture  Setup Time   Final    GRAM POSITIVE COCCI ANAEROBIC BOTTLE ONLY CRITICAL RESULT CALLED TO, READ BACK BY AND VERIFIED WITH: PHARMD DREW WOFFORD [id number] AT 1100 BY EC    Culture (A)  Final    STAPHYLOCOCCUS HAEMOLYTICUS THE SIGNIFICANCE OF ISOLATING THIS ORGANISM FROM A SINGLE SET OF BLOOD CULTURES WHEN  MULTIPLE SETS ARE DRAWN IS UNCERTAIN. PLEASE NOTIFY THE MICROBIOLOGY DEPARTMENT WITHIN ONE WEEK IF SPECIATION AND SENSITIVITIES ARE REQUIRED. Performed at [facility], [address] [place], [place] [address]    Report Status 01/20/2024 FINAL  Final  Blood Culture ID Panel (Reflexed)     Status: Abnormal   Collection Time: 01/17/24  2:56 PM  Result Value Ref Range Status   Enterococcus faecalis NOT DETECTED NOT DETECTED Final   Enterococcus Faecium NOT DETECTED NOT DETECTED Final   Listeria monocytogenes NOT DETECTED NOT DETECTED Final   Staphylococcus species DETECTED (A) NOT DETECTED Final    Comment: CRITICAL RESULT CALLED TO, READ BACK BY AND VERIFIED WITH: PHARMD DREW WOFFORD [phone] AT 1100 BY EC    Staphylococcus aureus (BCID) NOT DETECTED NOT DETECTED Final   Staphylococcus epidermidis NOT DETECTED NOT DETECTED Final   Staphylococcus lugdunensis NOT DETECTED NOT DETECTED Final   Streptococcus species NOT DETECTED NOT DETECTED Final   Streptococcus agalactiae NOT DETECTED NOT DETECTED Final   Streptococcus pneumoniae NOT DETECTED NOT DETECTED Final   Streptococcus pyogenes NOT DETECTED NOT DETECTED Final   A.calcoaceticus-baumannii NOT DETECTED NOT DETECTED Final   Bacteroides fragilis NOT DETECTED NOT DETECTED Final   Enterobacterales NOT DETECTED NOT DETECTED Final   Enterobacter cloacae complex NOT DETECTED NOT DETECTED Final   Escherichia coli NOT DETECTED NOT DETECTED Final   Klebsiella aerogenes NOT DETECTED NOT DETECTED Final   Klebsiella oxytoca NOT DETECTED NOT DETECTED Final   Klebsiella pneumoniae NOT DETECTED NOT DETECTED Final   Proteus species NOT DETECTED NOT DETECTED Final   Salmonella species NOT DETECTED NOT DETECTED Final   Serratia marcescens NOT DETECTED NOT DETECTED Final   Haemophilus influenzae NOT DETECTED NOT DETECTED Final   Neisseria meningitidis NOT DETECTED NOT DETECTED Final   Pseudomonas aeruginosa NOT DETECTED NOT DETECTED Final    Stenotrophomonas maltophilia NOT DETECTED NOT DETECTED Final   Candida albicans NOT DETECTED NOT DETECTED Final   Candida auris NOT DETECTED NOT DETECTED Final   Candida glabrata NOT DETECTED NOT DETECTED Final   Candida krusei NOT DETECTED NOT DETECTED Final   Candida parapsilosis NOT DETECTED NOT DETECTED Final   Candida tropicalis NOT DETECTED NOT DETECTED Final   Cryptococcus neoformans/gattii NOT DETECTED NOT DETECTED  Final    Comment: Performed at [facility], [address] [place], [place] [address]  MRSA Next Gen by PCR, Nasal     Status: None   Collection Time: 01/18/24  8:42 AM   Specimen: Nasal Mucosa; Nasal Swab  Result Value Ref Range Status   MRSA by PCR Next Gen NOT DETECTED NOT DETECTED Final    Comment: (NOTE) The GeneXpert MRSA Assay (FDA approved for NASAL specimens only), is one component of a comprehensive MRSA colonization surveillance program. It is not intended to diagnose MRSA infection nor to guide or monitor treatment for MRSA infections. Test performance is not FDA approved in patients less than 24 years old. Performed at [facility], [address], [place], [place] [address]   C Difficile Quick Screen w PCR reflex     Status: None   Collection Time: 01/19/24  9:19 AM   Specimen: STOOL  Result Value Ref Range Status   C Diff antigen NEGATIVE NEGATIVE Final   C Diff toxin NEGATIVE NEGATIVE Final   C Diff interpretation No C. difficile detected.  Final    Comment: Performed at [facility], [address], [place], [place] [address]         Radiology Studies: CT CHEST ABDOMEN PELVIS W CONTRAST Result Date: 01/22/2024 CLINICAL DATA:  Stage IV lung cancer and malignant pleural effusion EXAM: CT CHEST, ABDOMEN, AND PELVIS WITH CONTRAST TECHNIQUE: Multidetector CT imaging of the chest, abdomen and pelvis was performed following the standard protocol during bolus administration of intravenous contrast.  RADIATION DOSE REDUCTION: This exam was performed according to the departmental dose-optimization program which includes automated exposure control, adjustment of the mA and/or kV according to patient size and/or use of iterative reconstruction technique. CONTRAST:  OMNIPAQUE  IOHEXOL  300 MG/ML  SOLN COMPARISON:  01/17/2024, 11/12/2023, 10/19/2023 FINDINGS: CT CHEST FINDINGS Cardiovascular: The heart is enlarged without pericardial effusion. No evidence of thoracic aortic aneurysm or dissection. Atherosclerosis of the aorta and coronary vasculature. Mediastinum/Nodes: Stable mediastinal adenopathy, measuring 12 mm in the pretracheal region and 17 mm in the precarinal region in short axis. Thyroid , trachea, and esophagus are stable. Moderate hiatal hernia again noted. Lungs/Pleura: There are small bilateral pleural effusions, right greater than left. Masslike consolidation within the right lower lobe measuring 4.4 x 2.6 cm image 91/4 corresponds to region of increased metabolic activity on prior PET scan. There is new consolidation within the lingula and left lower lobe, as well as patchy airspace disease throughout the right upper lobe, which could reflect multifocal infection. No pneumothorax. Central airways are patent. Musculoskeletal: No acute or destructive bony abnormalities. Reconstructed images demonstrate no additional findings. CT ABDOMEN PELVIS FINDINGS Hepatobiliary: No focal liver abnormality is seen. Status post cholecystectomy. No biliary dilatation. Pancreas: Unremarkable. No pancreatic ductal dilatation or surrounding inflammatory changes. Spleen: Normal in size without focal abnormality. Adrenals/Urinary Tract: Stable nonspecific thickening of the left adrenal gland, favor adenoma or hyperplasia given lack of metabolic activity on prior PET scan. Right adrenal is unremarkable. The kidneys enhance normally. Moderate distention of the urinary bladder without filling defect or mucosal  abnormality. Stomach/Bowel: No bowel obstruction or ileus. Large amount of retained stool within the rectal vault consistent with fecal impaction, with mild rectal wall thickening and perirectal fat stranding concerning for stercoral colitis. Colonic diverticulosis without evidence of acute diverticulitis. Postsurgical changes from prior partial colon resection and reanastomosis. Moderate hiatal hernia. Vascular/Lymphatic: Aortic atherosclerosis. No enlarged abdominal or pelvic lymph nodes. Reproductive: Uterus  and bilateral adnexa are unremarkable. Other: No free fluid or free intraperitoneal gas. Evidence of prior ventral hernia repair, with wide diastasis of the rectus musculature again noted and unchanged since prior exam. Musculoskeletal: No acute or destructive bony abnormalities. IMPRESSION: 1. Masslike consolidation within the right lower lobe compatible with known history of lung cancer. This is decreased in size since prior study. 2. Small bilateral pleural effusions, right greater than left. 3. New multifocal bilateral airspace disease most pronounced in the right upper and left lower lobes, suspicious for pneumonia. 4. Stable borderline enlarged mediastinal lymph nodes, equivocal based on prior PET scan. 5. No evidence of distant metastases. 6. Large amount of retained stool in the rectal vault consistent with fecal impaction, with superimposed findings suggesting stercoral colitis. 7. Diffuse colonic diverticulosis without diverticulitis. 8. Moderate hiatal hernia. 9.  Aortic Atherosclerosis (ICD10-I70.0). Electronically Signed   By: Bobbye Burrow M.D.   On: 01/22/2024 18:21        Scheduled Meds:  sodium chloride    Intravenous Once   aspirin  EC  81 mg Oral Q1200   cyanocobalamin   1,000 mcg Oral Daily   feeding supplement  237 mL Oral TID BM   filgrastim  (NIVESTYM ) SQ  300 mcg Subcutaneous Daily   folic acid   1 mg Oral Daily   levothyroxine   50 mcg Oral Q0600   pantoprazole   40 mg Oral  Daily   potassium chloride   40 mEq Oral TID   simvastatin   20 mg Oral QHS   Continuous Infusions:        Audria Leather, MD [facility] 01/23/2024, 11:36 AM  .

## 2024-01-23 NOTE — Progress Notes (Signed)
 Pt had oral dose of K+unable to take oral dose.SRP, RN

## 2024-01-23 NOTE — Plan of Care (Signed)

## 2024-01-23 NOTE — Progress Notes (Signed)
 PIV consult: Pt has no current IV meds. Discussed with Nathaniel,RN. Will wait until IV meds are needed to replace PIV.

## 2024-01-23 NOTE — Plan of Care (Signed)
 Id brief note   Reviewed chest ct.  Fairly similar location wise with more airspace/fibrosis changes bilateral basilar processes from 10/2023 chest ct Not hypoxemic And had received the amount of hcap coverage    -would continue off abx at this time -supportive care as per primary team/oncology -please reengage id if further issue arises

## 2024-01-24 DIAGNOSIS — A419 Sepsis, unspecified organism: Secondary | ICD-10-CM | POA: Diagnosis not present

## 2024-01-24 DIAGNOSIS — D696 Thrombocytopenia, unspecified: Secondary | ICD-10-CM | POA: Diagnosis not present

## 2024-01-24 DIAGNOSIS — C3491 Malignant neoplasm of unspecified part of right bronchus or lung: Secondary | ICD-10-CM | POA: Diagnosis not present

## 2024-01-24 DIAGNOSIS — D701 Agranulocytosis secondary to cancer chemotherapy: Secondary | ICD-10-CM | POA: Diagnosis not present

## 2024-01-24 DIAGNOSIS — E871 Hypo-osmolality and hyponatremia: Secondary | ICD-10-CM | POA: Diagnosis not present

## 2024-01-24 DIAGNOSIS — T451X5A Adverse effect of antineoplastic and immunosuppressive drugs, initial encounter: Secondary | ICD-10-CM | POA: Diagnosis not present

## 2024-01-24 DIAGNOSIS — R6520 Severe sepsis without septic shock: Secondary | ICD-10-CM | POA: Diagnosis not present

## 2024-01-24 LAB — CBC WITH DIFFERENTIAL/PLATELET
Abs Immature Granulocytes: 0 10*3/uL (ref 0.00–0.07)
Basophils Absolute: 0 10*3/uL (ref 0.0–0.1)
Basophils Relative: 0 %
Eosinophils Absolute: 0 10*3/uL (ref 0.0–0.5)
Eosinophils Relative: 0 %
HCT: 21.9 % — ABNORMAL LOW (ref 36.0–46.0)
Hemoglobin: 7.3 g/dL — ABNORMAL LOW (ref 12.0–15.0)
Immature Granulocytes: 0 %
Lymphocytes Relative: 88 %
Lymphs Abs: 0.3 10*3/uL — ABNORMAL LOW (ref 0.7–4.0)
MCH: 30.8 pg (ref 26.0–34.0)
MCHC: 33.3 g/dL (ref 30.0–36.0)
MCV: 92.4 fL (ref 80.0–100.0)
Monocytes Absolute: 0 10*3/uL — ABNORMAL LOW (ref 0.1–1.0)
Monocytes Relative: 9 %
Neutro Abs: 0 10*3/uL — CL (ref 1.7–7.7)
Neutrophils Relative %: 3 %
Platelets: 35 10*3/uL — ABNORMAL LOW (ref 150–400)
RBC: 2.37 MIL/uL — ABNORMAL LOW (ref 3.87–5.11)
RDW: 13.8 % (ref 11.5–15.5)
WBC: 0.4 10*3/uL — CL (ref 4.0–10.5)
nRBC: 0 % (ref 0.0–0.2)

## 2024-01-24 LAB — COMPREHENSIVE METABOLIC PANEL WITH GFR
ALT: 18 U/L (ref 0–44)
AST: 20 U/L (ref 15–41)
Albumin: 1.9 g/dL — ABNORMAL LOW (ref 3.5–5.0)
Alkaline Phosphatase: 225 U/L — ABNORMAL HIGH (ref 38–126)
Anion gap: 11 (ref 5–15)
BUN: 19 mg/dL (ref 8–23)
CO2: 29 mmol/L (ref 22–32)
Calcium: 8 mg/dL — ABNORMAL LOW (ref 8.9–10.3)
Chloride: 93 mmol/L — ABNORMAL LOW (ref 98–111)
Creatinine, Ser: 0.78 mg/dL (ref 0.44–1.00)
GFR, Estimated: >60 mL/min (ref >60)
Glucose, Bld: 116 mg/dL — ABNORMAL HIGH (ref 70–99)
Potassium: 2.6 mmol/L — CL (ref 3.5–5.1)
Sodium: 133 mmol/L — ABNORMAL LOW (ref 135–145)
Total Bilirubin: 1 mg/dL (ref 0.0–1.2)
Total Protein: 6.1 g/dL — ABNORMAL LOW (ref 6.5–8.1)

## 2024-01-24 LAB — MAGNESIUM: Magnesium: 1.7 mg/dL (ref 1.7–2.4)

## 2024-01-24 NOTE — Progress Notes (Addendum)
 Consulted with pt throughout the night to offer pain med, pt refused until 0553, Oxycodone  with Tylenol  given to pt for generalized pain level 9/10 with activity, focused on low abdominal area. Pt had 2 small BM during the shift. Follow up after pain med administered pt appear to be sleeping or resting with eyes closed. SRP RN

## 2024-01-24 NOTE — Plan of Care (Signed)
  Problem: Education: Goal: Knowledge of General Education information will improve Description: Including pain rating scale, medication(s)/side effects and non-pharmacologic comfort measures 01/24/2024 0416 by Prudy Brownie, RN Outcome: Progressing 01/23/2024 2236 by Prudy Brownie, RN Outcome: Progressing   Problem: Health Behavior/Discharge Planning: Goal: Ability to manage health-related needs will improve 01/24/2024 0416 by Prudy Brownie, RN Outcome: Progressing 01/23/2024 2236 by Prudy Brownie, RN Outcome: Progressing   Problem: Clinical Measurements: Goal: Ability to maintain clinical measurements within normal limits will improve 01/24/2024 0416 by Prudy Brownie, RN Outcome: Progressing 01/23/2024 2236 by Prudy Brownie, RN Outcome: Progressing Goal: Will remain free from infection Outcome: Progressing Goal: Diagnostic test results will improve 01/24/2024 0416 by Prudy Brownie, RN Outcome: Progressing 01/23/2024 2236 by Prudy Brownie, RN Outcome: Progressing

## 2024-01-24 NOTE — TOC Progression Note (Signed)
 Transition of Care [facility]) - Progression Note    Patient Details  Name: Amanda Potter MRN: [id number] Date of Birth: 1946-05-22  [facility] CM/SW Contact  Gertha Ku, LCSW Phone Number: 01/24/2024, 10:59 AM  Clinical Narrative:    CSW met with the pt and spoke with the pt's daughter to discuss recommendations for SNF placement. CSW explained the process, noting that the pt would not need insurance authorization. Pt's daughter expressed concerns about the pt's mobility and stated she would like to hold off on making a decision until the pt is stronger. Pt's daughter mentioned that she prefers the pt's limbs only to be moved while in bed, as she believes the pt is too ill to be getting out of bed. CSW explained that physical therapy is focused on helping the pt build strength. Pt's daughter has decided to wait before making a decision regarding SNF placement. TOC to follow.   Expected Discharge Plan:  (TBD) Barriers to Discharge: Continued Medical Work up  Expected Discharge Plan and Services In-house Referral: Clinical Social Work     Living arrangements for the past 2 months: Single Family Home                 DME Arranged: N/A DME Agency: NA                   Social Determinants of Health (SDOH) Interventions SDOH Screenings   Food Insecurity: No Food Insecurity (01/17/2024)  Housing: Low Risk  (01/17/2024)  Transportation Needs: No Transportation Needs (01/17/2024)  Utilities: Not At Risk (01/17/2024)  Social Connections: Socially Isolated (01/17/2024)  Tobacco Use: Low Risk  (01/18/2024)    Readmission Risk Interventions    01/18/2024   11:34 AM 11/12/2023    3:00 PM  Readmission Risk Prevention Plan  Transportation Screening Complete Complete  PCP or Specialist Appt within 5-7 Days  Complete  Home Care Screening  Complete  Medication Review (RN CM)  Complete  HRI or Home Care Consult Complete   Social Work Consult for Recovery Care  Planning/Counseling Complete   Palliative Care Screening Not Applicable   Medication Review Oceanographer) Complete

## 2024-01-24 NOTE — Progress Notes (Signed)
   01/24/24 2124  Assess: MEWS Score  Temp (!) 101.7 F (38.7 C) (RN notified)  BP (!) 142/78  MAP (mmHg) 97  Pulse Rate (!) 125  Resp (!) 32  SpO2 93 %  O2 Device Room Air  Assess: MEWS Score  MEWS Temp 2  MEWS Systolic 0  MEWS Pulse 2  MEWS RR 2  MEWS LOC 0  MEWS Score 6  MEWS Score Color Red  Assess: if the MEWS score is Yellow or Red  Were vital signs accurate and taken at a resting state? Yes  Does the patient meet 2 or more of the SIRS criteria? Yes  Does the patient have a confirmed or suspected source of infection? No  MEWS guidelines implemented  No, previously yellow, continue vital signs every 4 hours  Assess: SIRS CRITERIA  SIRS Temperature  1  SIRS Respirations  1  SIRS Pulse 1  SIRS WBC 0  SIRS Score Sum  3

## 2024-01-24 NOTE — Progress Notes (Signed)
 Daily Progress Note   Patient Name: Amanda Potter       Date: 01/24/2024 DOB: Jan 29, 1946  Age: 78 y.o. MRN#: [id number] Attending Physician: Audria Leather, MD Primary Care Physician: Lory Rough., PA-C Admit Date: 01/17/2024 Length of Stay: 7 days  Reason for Consultation/Follow-up: Establishing goals of care  Subjective:   CC: Patient resting in bed, awake, alert, states that the work of her breathing is slowly improving, she did attempt to PT on 4 - 30 - 25, she is trying to increase her p.o. intake.  Son present at bedside this morning Following up regarding complex medical decision making.  Subjective:  EMR reviewed, patient resting in bed, no distress.     Objective:   Vital Signs:  BP 137/86 (BP Location: Left Arm)   Pulse (!) 124   Temp 98 F (36.7 C) (Oral)   Resp 19   Ht 5\' 6"  (1.676 m)   Wt 63.5 kg   SpO2 (!) 87%   BMI 22.60 kg/m   Physical Exam: General: NAD, awake, ill-appearing, frail Cardiovascular: RRR Respiratory: increased work of breathing noted, coughing noted Neuro: resting in bed.   Imaging: I personally reviewed recent imaging.   Assessment & Plan:   Assessment: Patient is an 78 year old female with a past medical history of stage IV non-small cell lung cancer on palliative chemotherapy, hypertension, and hyperlipidemia who was admitted on 01/17/2024 for management of neutropenia and severe weakness.  Upon admission, patient found to have sepsis in setting of neutropenic fever.  Unknown source of sepsis though patient having frequent diarrhea.  Patient on broad-spectrum antibiotics.  Patient also receiving management for electrolyte abnormalities.  Oncology consulted for recommendations.  Palliative medicine team consulted to assist with complex medical decision making.  Recommendations/Plan: # Complex medical decision making/goals of care: Patient admitted for management of sepsis in setting of severe neutropenia.  Patient had recently  received chemotherapy for stage IV non-small cell lung cancer.              DNR                 - Patient has ACP documentation already completed on file.  Patient named HCPOA Pamela Dillon, primary alternate [name], and secondary alternant Gerardine Knock, Marieta Shorten.                -  Code Status: DNR   # Symptom management Patient is receiving these palliative interventions for symptom management with an intent to improve quality of life.                 - Pain/shortness of breath, in setting of stage IV non-small cell lung cancer Patient noted to have cough during visit.                               - Continue oxycodone  2.5 mg every 4 hours as needed.  Noted can increase to 5 mg if feels dose is not providing relief. Physical therapy note reviewed, TOC note reviewed.  Recommend SNF rehab with palliative on discharge. # Psycho-social/Spiritual Support:  - Support System: Daughter, son Discussed with son at bedside this morning about monitoring hospital course and continuing goals of care discussions.   Thank you for allowing the palliative care team to participate in the care [name]. Mod MDM [name] MD.  Palliative Care Provider PMT # [phone]  If patient remains symptomatic despite maximum doses, please  call PMT at [phone] between 0700 and 1900. Outside of these hours, please call attending, as PMT does not have night coverage.

## 2024-01-24 NOTE — Progress Notes (Signed)
 OT Cancellation Note  Patient Details Name: Amanda Potter MRN: [id number] DOB: Dec 11, 1945   Cancelled Treatment:    Reason Eval/Treat Not Completed: Fatigue/lethargy limiting ability to participate OT attempted evaluation this afternoon. Patient extremely sleepy and reports fatigue declining session today. OT will re-attempt evaluation as schedule allows.   Ardelia Beau OT/L Acute Rehabilitation Department  [phone]   [facility] OT/L Acute Rehabilitation Department  [phone]  01/24/2024, 3:46 PM

## 2024-01-24 NOTE — Progress Notes (Signed)
 Amanda Potter   DOB:03/23/1946   HY#:[id number]      ASSESSMENT & PLAN:  Neutropenic fever - Likely due to recent chemotherapy received 01/10/2024 - Patient's WBC has remained very low with ANC 0.0 despite receiving G-CSF 300 mcg times last 7 days.  Recommend continuation until ANC >1.5  - Slight temp 99 noted within last 24 hours, currently afebrile - Continue antibiotics as ordered - Continue to monitor fever curve - Continue to monitor CBC with differential   Non-small cell lung cancer History of malignant pleural effusion - Diagnosed January 2025 - Started on palliative chemotherapy with carboplatin , pemetrexed  and pembrolizumab , initiated on 10/30/2023. - Most recent chemotherapy C3 with only carboplatin  and Alimta given 01/10/2024.  Pembrolizumab  held secondary to elevated liver enzymes.  Seen in outpatient oncology office 01/10/2024. - Medical oncology/Dr. Marguerita Shih following.    Anemia, normocytic - Hemoglobin low 7.3 - Recommend transfuse PRBC for Hgb <7.0 - Continue to monitor CBC with differential  Thrombocytopenia - Platelets low 35K today. - Status post 1 unit platelets given 01/21/2024 - Recommend platelet transfusion for counts <20K or <50K with bleeding - Continue to monitor CBC with differential   Electrolyte imbalance - As evidenced by hyponatremia and hypokalemia. - Continue electrolyte replacement as appropriate  - Continue to monitor BMP   Elevated LFTs - LFTs have normalized.  Bilirubin remains WNL  - Continue to monitor hepatic function      Code Status DNR-limited  Subjective:  Patient seen awake alert and oriented laying in bed using her phone.  Reports that she is very tired and feels tired all the time.  Denies pain.  Denies bleeding.  No other acute distress is noted.  Objective:  Vitals:   01/23/24 2148 01/24/24 0542  BP: 134/85 132/62  Pulse: (!) 116 (!) 124  Resp: 20 20  Temp: 99.5 F (37.5 C) 98.5 F (36.9 C)  SpO2: 92% 92%      Intake/Output Summary (Last 24 hours) at 01/24/2024 1147 Last data filed at 01/23/2024 2151 Gross per 24 hour  Intake --  Output 200 ml  Net -200 ml     PHYSICAL EXAMINATION: ECOG PERFORMANCE STATUS: 3 - Symptomatic, >50% confined to bed  Vitals:   01/23/24 2148 01/24/24 0542  BP: 134/85 132/62  Pulse: (!) 116 (!) 124  Resp: 20 20  Temp: 99.5 F (37.5 C) 98.5 F (36.9 C)  SpO2: 92% 92%   Filed Weights   01/17/24 1232  Weight: 140 lb (63.5 kg)    GENERAL: alert, no distress and comfortable +frail +chronically ill-appearing SKIN: +Pale skin color, texture, turgor are normal, no rashes or significant lesions EYES: normal, conjunctiva are pink and non-injected, sclera clear OROPHARYNX: no exudate, no erythema and lips, buccal mucosa, and tongue normal  NECK: supple, thyroid  normal size, non-tender, without nodularity LYMPH: no palpable lymphadenopathy in the cervical, axillary or inguinal LUNGS: clear to auscultation and percussion with normal breathing effort HEART: regular rate & rhythm and no murmurs and no lower extremity edema ABDOMEN: abdomen soft, non-tender and normal bowel sounds MUSCULOSKELETAL: no cyanosis of digits and no clubbing  PSYCH: alert & oriented x 3 with fluent speech NEURO: no focal motor/sensory deficits   All questions were answered. The patient knows to call the clinic with any problems, questions or concerns.   The total time spent in the appointment was 40 minutes encounter with patient including review of chart and various tests results, discussions about plan of care and coordination of care plan  Edwina Gram  J Hagop Mccollam, NP 01/24/2024 11:47 AM    Labs Reviewed:  Lab Results  Component Value Date   WBC 0.4 (LL) 01/24/2024   HGB 7.3 (L) 01/24/2024   HCT 21.9 (L) 01/24/2024   MCV 92.4 01/24/2024   PLT 35 (L) 01/24/2024   Recent Labs    01/22/24 0355 01/23/24 0415 01/24/24 0406  NA 134* 132* 133*  K 2.3* 2.2* 2.6*  CL 97* 91* 93*  CO2 28  26 29   GLUCOSE 102* 96 116*  BUN 9 13 19   CREATININE 0.64 0.49 0.78  CALCIUM 8.2* 8.1* 8.0*  GFRNONAA >60 >60 >60  PROT 5.9* 6.6 6.1*  ALBUMIN 2.0* 2.2* 1.9*  AST 31 26 20   ALT 25 24 18   ALKPHOS 286* 267* 225*  BILITOT 1.0 1.2 1.0    Studies Reviewed:  CT CHEST ABDOMEN PELVIS W CONTRAST Result Date: 01/22/2024 CLINICAL DATA:  Stage IV lung cancer and malignant pleural effusion EXAM: CT CHEST, ABDOMEN, AND PELVIS WITH CONTRAST TECHNIQUE: Multidetector CT imaging of the chest, abdomen and pelvis was performed following the standard protocol during bolus administration of intravenous contrast. RADIATION DOSE REDUCTION: This exam was performed according to the departmental dose-optimization program which includes automated exposure control, adjustment of the mA and/or kV according to patient size and/or use of iterative reconstruction technique. CONTRAST:  OMNIPAQUE  IOHEXOL  300 MG/ML  SOLN COMPARISON:  01/17/2024, 11/12/2023, 10/19/2023 FINDINGS: CT CHEST FINDINGS Cardiovascular: The heart is enlarged without pericardial effusion. No evidence of thoracic aortic aneurysm or dissection. Atherosclerosis of the aorta and coronary vasculature. Mediastinum/Nodes: Stable mediastinal adenopathy, measuring 12 mm in the pretracheal region and 17 mm in the precarinal region in short axis. Thyroid , trachea, and esophagus are stable. Moderate hiatal hernia again noted. Lungs/Pleura: There are small bilateral pleural effusions, right greater than left. Masslike consolidation within the right lower lobe measuring 4.4 x 2.6 cm image 91/4 corresponds to region of increased metabolic activity on prior PET scan. There is new consolidation within the lingula and left lower lobe, as well as patchy airspace disease throughout the right upper lobe, which could reflect multifocal infection. No pneumothorax. Central airways are patent. Musculoskeletal: No acute or destructive bony abnormalities. Reconstructed images  demonstrate no additional findings. CT ABDOMEN PELVIS FINDINGS Hepatobiliary: No focal liver abnormality is seen. Status post cholecystectomy. No biliary dilatation. Pancreas: Unremarkable. No pancreatic ductal dilatation or surrounding inflammatory changes. Spleen: Normal in size without focal abnormality. Adrenals/Urinary Tract: Stable nonspecific thickening of the left adrenal gland, favor adenoma or hyperplasia given lack of metabolic activity on prior PET scan. Right adrenal is unremarkable. The kidneys enhance normally. Moderate distention of the urinary bladder without filling defect or mucosal abnormality. Stomach/Bowel: No bowel obstruction or ileus. Large amount of retained stool within the rectal vault consistent with fecal impaction, with mild rectal wall thickening and perirectal fat stranding concerning for stercoral colitis. Colonic diverticulosis without evidence of acute diverticulitis. Postsurgical changes from prior partial colon resection and reanastomosis. Moderate hiatal hernia. Vascular/Lymphatic: Aortic atherosclerosis. No enlarged abdominal or pelvic lymph nodes. Reproductive: Uterus and bilateral adnexa are unremarkable. Other: No free fluid or free intraperitoneal gas. Evidence of prior ventral hernia repair, with wide diastasis of the rectus musculature again noted and unchanged since prior exam. Musculoskeletal: No acute or destructive bony abnormalities. IMPRESSION: 1. Masslike consolidation within the right lower lobe compatible with known history of lung cancer. This is decreased in size since prior study. 2. Small bilateral pleural effusions, right greater than left. 3. New multifocal bilateral airspace  disease most pronounced in the right upper and left lower lobes, suspicious for pneumonia. 4. Stable borderline enlarged mediastinal lymph nodes, equivocal based on prior PET scan. 5. No evidence of distant metastases. 6. Large amount of retained stool in the rectal vault consistent  with fecal impaction, with superimposed findings suggesting stercoral colitis. 7. Diffuse colonic diverticulosis without diverticulitis. 8. Moderate hiatal hernia. 9.  Aortic Atherosclerosis (ICD10-I70.0). Electronically Signed   By: Bobbye Burrow M.D.   On: 01/22/2024 18:21   DG Chest Portable 1 View Result Date: 01/17/2024 CLINICAL DATA:  Cough and fever EXAM: PORTABLE CHEST 1 VIEW COMPARISON:  November 13, 2023 FINDINGS: Chronic bronchial changes of the right lower lobe and left lower lobe retrocardiac atelectasis. No consolidations No pleural effusions. Heart and mediastinum normal IMPRESSION: Chronic bronchial changes of the right lower lobe and left lower lobe retrocardiac atelectasis. Electronically Signed   By: Fredrich Jefferson M.D.   On: 01/17/2024 14:28

## 2024-01-24 NOTE — Progress Notes (Signed)
 PROGRESS NOTE    Amanda Potter  ZOX:[id number] DOB: 1946-04-06 DOA: 01/17/2024 PCP: Lory Rough., PA-C   Brief Narrative:  78 y.o. female with medical history significant for stage IV non-small cell lung cancer, hypertension, hyperlipidemia on palliative systemic chemotherapy presented with severe weakness, cough.  On presentation, she was tachycardic, leukopenic, pancytopenic, sodium of 125 with elevated lactic acid.  She was started on broad-spectrum antibiotics.  RSV/COVID/influenza PCR negative.  Oncology was consulted and patient was started on Granix .  ID was consulted and antibiotics were discontinued.  Assessment & Plan:   Neutropenic fever Staph hemolyticus bacteremia: Most likely contaminant Sepsis has been ruled out - Patient initially started on broad-spectrum antibiotics with concerns for neutropenic fever and sepsis.  ID has discontinued all antibiotics.  Sepsis has been ruled out.  Staph haemolyticus bacteremia is possibly considered contaminant - Currently afebrile and not spiking any temperatures.  Remains neutropenic.  Continue Granix  as per oncology till [facility] is above 1500  Hyponatremia - Improving.  133 today.  Monitor.  Off salt tablets  Hypokalemia - Replace.  Repeat a.m. labs.  Patient has refused oral potassium supplementation intermittently  Stage IV non-small cell lung cancer Pancytopenia Goals of care -Oncology following.  Outpatient follow-up with oncology.  Palliative care following.  CODE STATUS has been changed to DNR - No signs of bleeding.  Monitor platelets and hemoglobin.  Transfuse if hemoglobin is less than 7.  Patient received 1 unit of platelet transfusion on 01/22/2024.  GERD - Continue Protonix   Hyperlipidemia -continue statin  Hypothyroidism -Continue Synthroid   Physical deconditioning - PT recommending SNF placement.  Consult TOC.  DVT prophylaxis: SCDs Code Status: DNR Family Communication: None at bedside Disposition  Plan: Status is: Inpatient Remains inpatient appropriate because: Of severity of illness  Consultants: Oncology/ID/palliative care  Procedures: None  Antimicrobials:  Anti-infectives (From admission, onward)    Start     Dose/Rate Route Frequency Ordered Stop   01/18/24 0500  metroNIDAZOLE  (FLAGYL ) IVPB 500 mg  Status:  Discontinued        500 mg 100 mL/hr over 60 Minutes Intravenous Every 12 hours 01/18/24 0158 01/21/24 1538   01/18/24 0400  vancomycin  (VANCOCIN ) IVPB 1000 mg/200 mL premix  Status:  Discontinued        1,000 mg 200 mL/hr over 60 Minutes Intravenous Every 24 hours 01/18/24 0158 01/21/24 1538   01/18/24 0300  ceFEPIme  (MAXIPIME ) 2 g in sodium chloride  0.9 % 100 mL IVPB        2 g 200 mL/hr over 30 Minutes Intravenous Every 12 hours 01/18/24 0158 01/22/24 1842   01/17/24 1400  ceFEPIme  (MAXIPIME ) 2 g in sodium chloride  0.9 % 100 mL IVPB        2 g 200 mL/hr over 30 Minutes Intravenous  Once 01/17/24 1357 01/17/24 1621   01/17/24 1400  metroNIDAZOLE  (FLAGYL ) IVPB 500 mg        500 mg 100 mL/hr over 60 Minutes Intravenous  Once 01/17/24 1357 01/17/24 1736   01/17/24 1400  vancomycin  (VANCOCIN ) IVPB 1000 mg/200 mL premix        1,000 mg 200 mL/hr over 60 Minutes Intravenous  Once 01/17/24 1357 01/17/24 1751        Subjective: Patient seen and examined at bedside.  No fever, vomiting, chest pain reported.  Slow to respond.  Still feels weak. Objective: Vitals:   01/23/24 0447 01/23/24 1204 01/23/24 2148 01/24/24 0542  BP: 130/78 95/76 134/85 132/62  Pulse: (!) 104 83 (!) 116 Aaron Aas)  124  Resp: 20 20 20 20   Temp: 98 F (36.7 C) 98.4 F (36.9 C) 99.5 F (37.5 C) 98.5 F (36.9 C)  TempSrc:  Oral Oral Oral  SpO2: 94% 94% 92% 92%  Weight:      Height:        Intake/Output Summary (Last 24 hours) at 01/24/2024 0804 Last data filed at 01/23/2024 2151 Gross per 24 hour  Intake --  Output 200 ml  Net -200 ml   Filed Weights   01/17/24 1232  Weight: 63.5 kg     Examination:  General: On 2 L oxygen via nasal cannula.  No distress.  Chronically ill and deconditioned looking. ENT/neck: No thyromegaly.  JVD is not elevated  respiratory: Decreased breath sounds at bases bilaterally with some crackles; no wheezing  CVS: S1-S2 heard, rate controlled currently Abdominal: Soft, nontender, slightly distended; no organomegaly, bowel sounds are heard Extremities: Trace lower extremity edema; no cyanosis  CNS: Awake and alert.  Extremely slow to respond.  Poor historian.  No focal neurologic deficit.  Moves extremities Lymph: No obvious lymphadenopathy Skin: No obvious ecchymosis/lesions  psych: Mostly flat affect.  Not agitated currently.   Musculoskeletal: No obvious joint swelling/deformity    Data Reviewed: I have personally reviewed following labs and imaging studies  CBC: Recent Labs  Lab 01/20/24 0348 01/21/24 0323 01/22/24 0355 01/23/24 0603 01/24/24 0406  WBC 0.4* 0.5* 0.5* 0.4* 0.4*  NEUTROABS 0.0* 0.0* 0.0* 0.0* 0.0*  HGB 8.2* 7.5* 7.5* 7.6* 7.3*  HCT 25.1* 23.0* 22.9* 21.5* 21.9*  MCV 95.1 94.3 93.5 90.3 92.4  PLT PLATELET CLUMPS NOTED ON SMEAR, COUNT APPEARS DECREASED 30* 16* 34* 35*   Basic Metabolic Panel: Recent Labs  Lab 01/20/24 0348 01/21/24 0323 01/22/24 0355 01/23/24 0415 01/24/24 0406  NA 129* 130* 134* 132* 133*  K 2.8* 2.8* 2.3* 2.2* 2.6*  CL 94* 96* 97* 91* 93*  CO2 25 25 28 26 29   GLUCOSE 84 106* 102* 96 116*  BUN 10 9 9 13 19   CREATININE 0.54 0.50 0.64 0.49 0.78  CALCIUM 8.2* 8.0* 8.2* 8.1* 8.0*  MG 1.7  --   --   --  1.7   GFR: Estimated Creatinine Clearance: 52.5 mL/min (by C-G formula based on SCr of 0.78 mg/dL). Liver Function Tests: Recent Labs  Lab 01/20/24 0348 01/21/24 0323 01/22/24 0355 01/23/24 0415 01/24/24 0406  AST 35 28 31 26 20   ALT 33 26 25 24 18   ALKPHOS 344* 287* 286* 267* 225*  BILITOT 1.0 1.0 1.0 1.2 1.0  PROT 5.8* 5.5* 5.9* 6.6 6.1*  ALBUMIN 2.2* 2.0* 2.0* 2.2* 1.9*    No results for input(s): "LIPASE", "AMYLASE" in the last 168 hours. No results for input(s): "AMMONIA" in the last 168 hours. Coagulation Profile: Recent Labs  Lab 01/17/24 1628  INR 1.0   Cardiac Enzymes: No results for input(s): "CKTOTAL", "CKMB", "CKMBINDEX", "TROPONINI" in the last 168 hours. BNP (last 3 results) No results for input(s): "PROBNP" in the last 8760 hours. HbA1C: No results for input(s): "HGBA1C" in the last 72 hours. CBG: No results for input(s): "GLUCAP" in the last 168 hours. Lipid Profile: No results for input(s): "CHOL", "HDL", "LDLCALC", "TRIG", "CHOLHDL", "LDLDIRECT" in the last 72 hours. Thyroid  Function Tests: No results for input(s): "TSH", "T4TOTAL", "FREET4", "T3FREE", "THYROIDAB" in the last 72 hours. Anemia Panel: No results for input(s): "VITAMINB12", "FOLATE", "FERRITIN", "TIBC", "IRON", "RETICCTPCT" in the last 72 hours. Sepsis Labs: Recent Labs  Lab 01/17/24 1426 01/17/24 1628  LATICACIDVEN 3.2* 1.6    Recent Results (from the past 240 hours)  Resp panel by RT-PCR (RSV, Flu A&B, Covid) Anterior Nasal Swab     Status: None   Collection Time: 01/17/24 12:45 PM   Specimen: Anterior Nasal Swab  Result Value Ref Range Status   SARS Coronavirus 2 by RT PCR NEGATIVE NEGATIVE Final    Comment: (NOTE) SARS-CoV-2 target nucleic acids are NOT DETECTED.  The SARS-CoV-2 RNA is generally detectable in upper respiratory specimens during the acute phase of infection. The lowest concentration of SARS-CoV-2 viral copies this assay can detect is 138 copies/mL. A negative result does not preclude SARS-Cov-2 infection and should not be used as the sole basis for treatment or other patient management decisions. A negative result may occur with  improper specimen collection/handling, submission of specimen other than nasopharyngeal swab, presence of viral mutation(s) within the areas targeted by this assay, and inadequate number of viral copies(<138  copies/mL). A negative result must be combined with clinical observations, patient history, and epidemiological information. The expected result is Negative.  Fact Sheet for Patients:  BloggerCourse.com  Fact Sheet for Healthcare Providers:  SeriousBroker.it  This test is no t yet approved or cleared by the [place]  FDA and  has been authorized for detection and/or diagnosis of SARS-CoV-2 by FDA under an Emergency Use Authorization (EUA). This EUA will remain  in effect (meaning this test can be used) for the duration of the COVID-19 declaration under Section 564(b)(1) of the Act, 21 U.S.C.section 360bbb-3(b)(1), unless the authorization is terminated  or revoked sooner.       Influenza A by PCR NEGATIVE NEGATIVE Final   Influenza B by PCR NEGATIVE NEGATIVE Final    Comment: (NOTE) The Xpert Xpress SARS-CoV-2/FLU/RSV plus assay is intended as an aid in the diagnosis of influenza from Nasopharyngeal swab specimens and should not be used as a sole basis for treatment. Nasal washings and aspirates are unacceptable for Xpert Xpress SARS-CoV-2/FLU/RSV testing.  Fact Sheet for Patients: BloggerCourse.com  Fact Sheet for Healthcare Providers: SeriousBroker.it  This test is not yet approved or cleared by the [place]  FDA and has been authorized for detection and/or diagnosis of SARS-CoV-2 by FDA under an Emergency Use Authorization (EUA). This EUA will remain in effect (meaning this test can be used) for the duration of the COVID-19 declaration under Section 564(b)(1) of the Act, 21 U.S.C. section 360bbb-3(b)(1), unless the authorization is terminated or revoked.     Resp Syncytial Virus by PCR NEGATIVE NEGATIVE Final    Comment: (NOTE) Fact Sheet for Patients: BloggerCourse.com  Fact Sheet for Healthcare  Providers: SeriousBroker.it  This test is not yet approved or cleared by the [place]  FDA and has been authorized for detection and/or diagnosis of SARS-CoV-2 by FDA under an Emergency Use Authorization (EUA). This EUA will remain in effect (meaning this test can be used) for the duration of the COVID-19 declaration under Section 564(b)(1) of the Act, 21 U.S.C. section 360bbb-3(b)(1), unless the authorization is terminated or revoked.  Performed at [facility], [address] [place], [place] [address]   Blood culture (routine x 2)     Status: None   Collection Time: 01/17/24  2:50 PM   Specimen: BLOOD  Result Value Ref Range Status   Specimen Description   Final    BLOOD RIGHT ANTECUBITAL Performed at [facility], [address], [place], [place] [address]    Special Requests   Final  BOTTLES DRAWN AEROBIC AND ANAEROBIC Blood Culture results may not be optimal due to an inadequate volume of blood received in culture bottles Performed at [facility], [address], [place], [place] [address]    Culture   Final    NO GROWTH 5 DAYS Performed at [facility], [address], [place], [place] [address]    Report Status 01/22/2024 FINAL  Final  Blood culture (routine x 2)     Status: Abnormal   Collection Time: 01/17/24  2:56 PM   Specimen: BLOOD LEFT HAND  Result Value Ref Range Status   Specimen Description   Final    BLOOD LEFT HAND Performed at [facility], [address] [place], [place] [address]    Special Requests   Final    BOTTLES DRAWN AEROBIC AND ANAEROBIC Blood Culture results may not be optimal due to an inadequate volume of blood received in culture bottles Performed at [facility], [address], [place], [place] [address]    Culture  Setup Time   Final    GRAM POSITIVE COCCI ANAEROBIC BOTTLE ONLY CRITICAL RESULT CALLED TO, READ BACK  BY AND VERIFIED WITH: PHARMD DREW WOFFORD [id number] AT 1100 BY EC    Culture (A)  Final    STAPHYLOCOCCUS HAEMOLYTICUS THE SIGNIFICANCE OF ISOLATING THIS ORGANISM FROM A SINGLE SET OF BLOOD CULTURES WHEN MULTIPLE SETS ARE DRAWN IS UNCERTAIN. PLEASE NOTIFY THE MICROBIOLOGY DEPARTMENT WITHIN ONE WEEK IF SPECIATION AND SENSITIVITIES ARE REQUIRED. Performed at [facility], [address], [place], [place] [address]    Report Status 01/20/2024 FINAL  Final  Blood Culture ID Panel (Reflexed)     Status: Abnormal   Collection Time: 01/17/24  2:56 PM  Result Value Ref Range Status   Enterococcus faecalis NOT DETECTED NOT DETECTED Final   Enterococcus Faecium NOT DETECTED NOT DETECTED Final   Listeria monocytogenes NOT DETECTED NOT DETECTED Final   Staphylococcus species DETECTED (A) NOT DETECTED Final    Comment: CRITICAL RESULT CALLED TO, READ BACK BY AND VERIFIED WITH: PHARMD DREW WOFFORD [phone] AT 1100 BY EC    Staphylococcus aureus (BCID) NOT DETECTED NOT DETECTED Final   Staphylococcus epidermidis NOT DETECTED NOT DETECTED Final   Staphylococcus lugdunensis NOT DETECTED NOT DETECTED Final   Streptococcus species NOT DETECTED NOT DETECTED Final   Streptococcus agalactiae NOT DETECTED NOT DETECTED Final   Streptococcus pneumoniae NOT DETECTED NOT DETECTED Final   Streptococcus pyogenes NOT DETECTED NOT DETECTED Final   A.calcoaceticus-baumannii NOT DETECTED NOT DETECTED Final   Bacteroides fragilis NOT DETECTED NOT DETECTED Final   Enterobacterales NOT DETECTED NOT DETECTED Final   Enterobacter cloacae complex NOT DETECTED NOT DETECTED Final   Escherichia coli NOT DETECTED NOT DETECTED Final   Klebsiella aerogenes NOT DETECTED NOT DETECTED Final   Klebsiella oxytoca NOT DETECTED NOT DETECTED Final   Klebsiella pneumoniae NOT DETECTED NOT DETECTED Final   Proteus species NOT DETECTED NOT DETECTED Final   Salmonella species NOT DETECTED NOT DETECTED Final   Serratia marcescens NOT  DETECTED NOT DETECTED Final   Haemophilus influenzae NOT DETECTED NOT DETECTED Final   Neisseria meningitidis NOT DETECTED NOT DETECTED Final   Pseudomonas aeruginosa NOT DETECTED NOT DETECTED Final   Stenotrophomonas maltophilia NOT DETECTED NOT DETECTED Final   Candida albicans NOT DETECTED NOT DETECTED Final   Candida auris NOT DETECTED NOT DETECTED Final   Candida glabrata NOT DETECTED NOT DETECTED Final   Candida krusei NOT DETECTED  NOT DETECTED Final   Candida parapsilosis NOT DETECTED NOT DETECTED Final   Candida tropicalis NOT DETECTED NOT DETECTED Final   Cryptococcus neoformans/gattii NOT DETECTED NOT DETECTED Final    Comment: Performed at [facility], [address], [place], [place] [address]  MRSA Next Gen by PCR, Nasal     Status: None   Collection Time: 01/18/24  8:42 AM   Specimen: Nasal Mucosa; Nasal Swab  Result Value Ref Range Status   MRSA by PCR Next Gen NOT DETECTED NOT DETECTED Final    Comment: (NOTE) The GeneXpert MRSA Assay (FDA approved for NASAL specimens only), is one component of a comprehensive MRSA colonization surveillance program. It is not intended to diagnose MRSA infection nor to guide or monitor treatment for MRSA infections. Test performance is not FDA approved in patients less than 54 years old. Performed at [facility], [address] [place], [place] [address]   C Difficile Quick Screen w PCR reflex     Status: None   Collection Time: 01/19/24  9:19 AM   Specimen: STOOL  Result Value Ref Range Status   C Diff antigen NEGATIVE NEGATIVE Final   C Diff toxin NEGATIVE NEGATIVE Final   C Diff interpretation No C. difficile detected.  Final    Comment: Performed at [facility], [address], [place], [place] [address]         Radiology Studies: CT CHEST ABDOMEN PELVIS W CONTRAST Result Date: 01/22/2024 CLINICAL DATA:  Stage IV lung cancer and malignant pleural effusion EXAM: CT CHEST,  ABDOMEN, AND PELVIS WITH CONTRAST TECHNIQUE: Multidetector CT imaging of the chest, abdomen and pelvis was performed following the standard protocol during bolus administration of intravenous contrast. RADIATION DOSE REDUCTION: This exam was performed according to the departmental dose-optimization program which includes automated exposure control, adjustment of the mA and/or kV according to patient size and/or use of iterative reconstruction technique. CONTRAST:  OMNIPAQUE  IOHEXOL  300 MG/ML  SOLN COMPARISON:  01/17/2024, 11/12/2023, 10/19/2023 FINDINGS: CT CHEST FINDINGS Cardiovascular: The heart is enlarged without pericardial effusion. No evidence of thoracic aortic aneurysm or dissection. Atherosclerosis of the aorta and coronary vasculature. Mediastinum/Nodes: Stable mediastinal adenopathy, measuring 12 mm in the pretracheal region and 17 mm in the precarinal region in short axis. Thyroid , trachea, and esophagus are stable. Moderate hiatal hernia again noted. Lungs/Pleura: There are small bilateral pleural effusions, right greater than left. Masslike consolidation within the right lower lobe measuring 4.4 x 2.6 cm image 91/4 corresponds to region of increased metabolic activity on prior PET scan. There is new consolidation within the lingula and left lower lobe, as well as patchy airspace disease throughout the right upper lobe, which could reflect multifocal infection. No pneumothorax. Central airways are patent. Musculoskeletal: No acute or destructive bony abnormalities. Reconstructed images demonstrate no additional findings. CT ABDOMEN PELVIS FINDINGS Hepatobiliary: No focal liver abnormality is seen. Status post cholecystectomy. No biliary dilatation. Pancreas: Unremarkable. No pancreatic ductal dilatation or surrounding inflammatory changes. Spleen: Normal in size without focal abnormality. Adrenals/Urinary Tract: Stable nonspecific thickening of the left adrenal gland, favor adenoma or hyperplasia  given lack of metabolic activity on prior PET scan. Right adrenal is unremarkable. The kidneys enhance normally. Moderate distention of the urinary bladder without filling defect or mucosal abnormality. Stomach/Bowel: No bowel obstruction or ileus. Large amount of retained stool within the rectal vault consistent with fecal impaction, with mild rectal wall thickening and perirectal fat stranding concerning for stercoral colitis. Colonic diverticulosis  without evidence of acute diverticulitis. Postsurgical changes from prior partial colon resection and reanastomosis. Moderate hiatal hernia. Vascular/Lymphatic: Aortic atherosclerosis. No enlarged abdominal or pelvic lymph nodes. Reproductive: Uterus and bilateral adnexa are unremarkable. Other: No free fluid or free intraperitoneal gas. Evidence of prior ventral hernia repair, with wide diastasis of the rectus musculature again noted and unchanged since prior exam. Musculoskeletal: No acute or destructive bony abnormalities. IMPRESSION: 1. Masslike consolidation within the right lower lobe compatible with known history of lung cancer. This is decreased in size since prior study. 2. Small bilateral pleural effusions, right greater than left. 3. New multifocal bilateral airspace disease most pronounced in the right upper and left lower lobes, suspicious for pneumonia. 4. Stable borderline enlarged mediastinal lymph nodes, equivocal based on prior PET scan. 5. No evidence of distant metastases. 6. Large amount of retained stool in the rectal vault consistent with fecal impaction, with superimposed findings suggesting stercoral colitis. 7. Diffuse colonic diverticulosis without diverticulitis. 8. Moderate hiatal hernia. 9.  Aortic Atherosclerosis (ICD10-I70.0). Electronically Signed   By: Bobbye Burrow M.D.   On: 01/22/2024 18:21        Scheduled Meds:  sodium chloride    Intravenous Once   aspirin  EC  81 mg Oral Q1200   cyanocobalamin   1,000 mcg Oral Daily    feeding supplement  237 mL Oral TID BM   filgrastim  (NIVESTYM ) SQ  300 mcg Subcutaneous Daily   folic acid   1 mg Oral Daily   levothyroxine   50 mcg Oral Q0600   pantoprazole   40 mg Oral Daily   potassium chloride   40 mEq Oral TID   simvastatin   20 mg Oral QHS   Continuous Infusions:        Audria Leather, MD [facility] 01/24/2024, 8:04 AM  .

## 2024-01-24 DEATH — deceased

## 2024-01-25 DIAGNOSIS — A419 Sepsis, unspecified organism: Secondary | ICD-10-CM | POA: Diagnosis not present

## 2024-01-25 DIAGNOSIS — R6520 Severe sepsis without septic shock: Secondary | ICD-10-CM | POA: Diagnosis not present

## 2024-01-25 DIAGNOSIS — C3491 Malignant neoplasm of unspecified part of right bronchus or lung: Secondary | ICD-10-CM | POA: Diagnosis not present

## 2024-01-25 LAB — CBC WITH DIFFERENTIAL/PLATELET
Abs Immature Granulocytes: 0 10*3/uL (ref 0.00–0.07)
Basophils Absolute: 0 10*3/uL (ref 0.0–0.1)
Basophils Relative: 2 %
Eosinophils Absolute: 0 10*3/uL (ref 0.0–0.5)
Eosinophils Relative: 0 %
HCT: 22.9 % — ABNORMAL LOW (ref 36.0–46.0)
Hemoglobin: 7.7 g/dL — ABNORMAL LOW (ref 12.0–15.0)
Immature Granulocytes: 0 %
Lymphocytes Relative: 87 %
Lymphs Abs: 0.5 10*3/uL — ABNORMAL LOW (ref 0.7–4.0)
MCH: 31.3 pg (ref 26.0–34.0)
MCHC: 33.6 g/dL (ref 30.0–36.0)
MCV: 93.1 fL (ref 80.0–100.0)
Monocytes Absolute: 0.1 10*3/uL (ref 0.1–1.0)
Monocytes Relative: 9 %
Neutro Abs: 0 10*3/uL — CL (ref 1.7–7.7)
Neutrophils Relative %: 2 %
Platelets: 39 10*3/uL — ABNORMAL LOW (ref 150–400)
RBC: 2.46 MIL/uL — ABNORMAL LOW (ref 3.87–5.11)
RDW: 13.8 % (ref 11.5–15.5)
Smear Review: NORMAL
WBC: 0.6 10*3/uL — CL (ref 4.0–10.5)
nRBC: 0 % (ref 0.0–0.2)

## 2024-01-25 LAB — BASIC METABOLIC PANEL WITH GFR
Anion gap: 11 (ref 5–15)
BUN: 23 mg/dL (ref 8–23)
CO2: 29 mmol/L (ref 22–32)
Calcium: 8.4 mg/dL — ABNORMAL LOW (ref 8.9–10.3)
Chloride: 95 mmol/L — ABNORMAL LOW (ref 98–111)
Creatinine, Ser: 0.77 mg/dL (ref 0.44–1.00)
GFR, Estimated: >60 mL/min (ref >60)
Glucose, Bld: 101 mg/dL — ABNORMAL HIGH (ref 70–99)
Potassium: 3 mmol/L — ABNORMAL LOW (ref 3.5–5.1)
Sodium: 135 mmol/L (ref 135–145)

## 2024-01-25 LAB — MAGNESIUM: Magnesium: 1.7 mg/dL (ref 1.7–2.4)

## 2024-01-25 MED ORDER — DAPTOMYCIN-SODIUM CHLORIDE 500-0.9 MG/50ML-% IV SOLN
500.0000 mg | Freq: Every day | INTRAVENOUS | Status: DC
  Administered 2024-01-25 – 2024-01-26 (×2): 500 mg via INTRAVENOUS
  Filled 2024-01-25 (×3): qty 50

## 2024-01-25 MED ORDER — POLYETHYLENE GLYCOL 3350 17 G PO PACK: 17.0000 g | PACK | Freq: Every day | ORAL | Status: DC | PRN

## 2024-01-25 MED ORDER — SENNOSIDES-DOCUSATE SODIUM 8.6-50 MG PO TABS
1.0000 | ORAL_TABLET | Freq: Two times a day (BID) | ORAL | Status: DC
  Filled 2024-01-25: qty 1

## 2024-01-25 MED ORDER — POTASSIUM CHLORIDE IN NACL 40-0.9 MEQ/L-% IV SOLN
INTRAVENOUS | Status: DC
  Filled 2024-01-25 (×2): qty 1000

## 2024-01-25 MED ORDER — BISACODYL 10 MG RE SUPP: 10.0000 mg | Freq: Every day | RECTAL | Status: DC | PRN

## 2024-01-25 MED ORDER — SODIUM CHLORIDE 0.9 % IV SOLN
2.0000 g | Freq: Two times a day (BID) | INTRAVENOUS | Status: DC
  Administered 2024-01-25 – 2024-01-26 (×4): 2 g via INTRAVENOUS
  Filled 2024-01-25 (×4): qty 12.5

## 2024-01-25 NOTE — Progress Notes (Signed)
 Daily Progress Note   Patient Name: Amanda Potter       Date: 01/25/2024 DOB: 1946/03/29  Age: 78 y.o. MRN#: [id number] Attending Physician: Amanda Leather, MD Primary Care Physician: Amanda Rough., PA-C Admit Date: 01/17/2024 Length of Stay: 8 days  Reason for Consultation/Follow-up: Establishing goals of care  Subjective:   CC: Patient resting in bed, daughter present at bedside this morning Overnight events noted, patient febrile, antibiotics restarted, blood cultures taken, gentle IV fluid hydration resumed. Goals of care discussions again done with the daughter Amanda Potter who is the HCPOA.  DNR/DNI otherwise no limits on care.  She does not not want the patient to be on comfort measures.  She is hopeful for ongoing stabilization/recovery.  She is hopeful for SNF rehab with palliative towards the end of this hospitalization after the patient recovers. Following up regarding complex medical decision making.  Subjective:  EMR reviewed, patient resting in bed, appears chronically ill    Objective:   Vital Signs:  BP 139/81 (BP Location: Right Arm)   Pulse (!) 123   Temp 98.8 F (37.1 C) (Oral)   Resp 18   Ht 5\' 6"  (1.676 m)   Wt 65.2 kg   SpO2 91%   BMI 23.20 kg/m   Physical Exam: General: NAD, awake, ill-appearing, frail Cardiovascular: RRR Respiratory: increased work of breathing noted, shallow coarse breath sounds Neuro: resting in bed.   Imaging: I personally reviewed recent imaging.   Assessment & Plan:   Assessment: Patient is an 79 year old female with a past medical history of stage IV non-small cell lung cancer on palliative chemotherapy, hypertension, and hyperlipidemia who was admitted on 01/17/2024 for management of neutropenia and severe weakness.  Upon admission, patient found to have sepsis in setting of neutropenic fever.  Unknown source of sepsis though patient having frequent diarrhea.  Patient on broad-spectrum antibiotics.  Patient also receiving  management for electrolyte abnormalities.  Oncology consulted for recommendations.  Palliative medicine team consulted to assist with complex medical decision making.  Recommendations/Plan: # Complex medical decision making/goals of care: Patient admitted for management of sepsis in setting of severe neutropenia.  Patient had recently received chemotherapy for stage IV non-small cell lung cancer.              DNR                 - Patient has ACP documentation already completed on file.  Patient named HCPOA Amanda Potter, primary alternate Amanda Potter, and secondary alternant Amanda Potter                -  Code Status: DNR Hospital course noted.  Overnight events noted.  Patient febrile overnight, antibiotics to be started, gentle IV fluid hydration also restarted. # Symptom management Patient is receiving these palliative interventions for symptom management with an intent to improve quality of life.                 - Pain/shortness of breath, in setting of stage IV non-small cell lung cancer Patient noted to have cough during visit.                               - Continue oxycodone  2.5 mg every 4 hours as needed.  Noted can increase to 5 mg if feels dose is not providing relief. Physical therapy note reviewed, TOC note reviewed.  Recommend SNF rehab with palliative on discharge. #  Psycho-social/Spiritual Support:  - Support System: Daughter, son Discussed with daughter Amanda Potter at bedside this morning about monitoring hospital course and continuing goals of care discussions.  Goals of care discussions again clarified with daughter Amanda Potter.  DNR otherwise continue all appropriate interventions.  Goals are not for comfort care. Thank you for allowing the palliative care team to participate in the care Amanda Potter. Mod MDM [name] MD.  Palliative Care Provider PMT # [phone]  If patient remains symptomatic despite maximum doses, please call PMT at [phone] between 0700 and 1900.  Outside of these hours, please call attending, as PMT does not have night coverage.

## 2024-01-25 NOTE — Progress Notes (Signed)
 PROGRESS NOTE    Amanda Potter  EAV:[id number] DOB: [birth year] DOA: 01/17/2024 PCP: Lory Rough., PA-C   Brief Narrative:  78 y.o. female with medical history significant for stage IV non-small cell lung cancer, hypertension, hyperlipidemia on palliative systemic chemotherapy presented with severe weakness, cough.  On presentation, she was tachycardic, leukopenic, pancytopenic, sodium of 125 with elevated lactic acid.  She was started on broad-spectrum antibiotics.  RSV/COVID/influenza PCR negative.  Oncology was consulted and patient was started on Granix .  ID was consulted and antibiotics were discontinued.  Assessment & Plan:   Neutropenic fever Staph hemolyticus bacteremia: Most likely contaminant Sepsis has been ruled out - Patient initially started on broad-spectrum antibiotics with concerns for neutropenic fever and sepsis.  ID has discontinued all antibiotics.  Sepsis has been ruled out.  Staph haemolyticus bacteremia is possibly considered contaminant - Temperature max of 101.7 over the last 24 hours.  Follow repeat blood cultures from today.  Start cefepime .  Remains neutropenic.  Continue Granix  as per oncology till [facility] is above 1500.    Hyponatremia - Improved.  Off salt tablets  Poor oral intake - Daughter states that patient's oral intake is very poor and was concerned about dehydration.  Will start gentle hydration  Hypokalemia - Replace.  Repeat a.m. labs.  Patient has refused oral potassium supplementation intermittently  Stage IV non-small cell lung cancer Pancytopenia Goals of care -Oncology following.  Outpatient follow-up with oncology.  Palliative care following.  CODE STATUS has been changed to DNR - No signs of bleeding.  Monitor platelets and hemoglobin.  Transfuse if hemoglobin is less than 7.  Patient received 1 unit of platelet transfusion on 01/22/2024.  Discontinue aspirin .  GERD - Continue Protonix   Hyperlipidemia -continue  statin  Hypothyroidism -Continue Synthroid   Physical deconditioning - PT recommending SNF placement.  TOC consulted.  DVT prophylaxis: SCDs Code Status: DNR Family Communication: Daughter, at bedside Disposition Plan: Status is: Inpatient Remains inpatient appropriate because: Of severity of illness  Consultants: Oncology/ID/palliative care  Procedures: None  Antimicrobials:  Anti-infectives (From admission, onward)    Start     Dose/Rate Route Frequency Ordered Stop   01/18/24 0500  metroNIDAZOLE  (FLAGYL ) IVPB 500 mg  Status:  Discontinued        500 mg 100 mL/hr over 60 Minutes Intravenous Every 12 hours 01/18/24 0158 01/21/24 1538   01/18/24 0400  vancomycin  (VANCOCIN ) IVPB 1000 mg/200 mL premix  Status:  Discontinued        1,000 mg 200 mL/hr over 60 Minutes Intravenous Every 24 hours 01/18/24 0158 01/21/24 1538   01/18/24 0300  ceFEPIme  (MAXIPIME ) 2 g in sodium chloride  0.9 % 100 mL IVPB        2 g 200 mL/hr over 30 Minutes Intravenous Every 12 hours 01/18/24 0158 01/22/24 1842   01/17/24 1400  ceFEPIme  (MAXIPIME ) 2 g in sodium chloride  0.9 % 100 mL IVPB        2 g 200 mL/hr over 30 Minutes Intravenous  Once 01/17/24 1357 01/17/24 1621   01/17/24 1400  metroNIDAZOLE  (FLAGYL ) IVPB 500 mg        500 mg 100 mL/hr over 60 Minutes Intravenous  Once 01/17/24 1357 01/17/24 1736   01/17/24 1400  vancomycin  (VANCOCIN ) IVPB 1000 mg/200 mL premix        1,000 mg 200 mL/hr over 60 Minutes Intravenous  Once 01/17/24 1357 01/17/24 1751        Subjective: Patient seen and examined at bedside.  Had fever overnight.  Feels extremely weak.  No agitation, vomiting reported.  Oral intake has remained poor. Objective: Vitals:   01/24/24 2249 01/25/24 0046 01/25/24 0500 01/25/24 0511  BP:  (!) 145/96  (!) 159/92  Pulse: (!) 120 (!) 115  (!) 123  Resp: (!) 24 18  20   Temp: 98.4 F (36.9 C) 98.2 F (36.8 C)  99.6 F (37.6 C)  TempSrc: Axillary   Oral  SpO2:  93%  91%  Weight:    65.2 kg   Height:        Intake/Output Summary (Last 24 hours) at 01/25/2024 0803 Last data filed at 01/24/2024 1320 Gross per 24 hour  Intake 120 ml  Output --  Net 120 ml   Filed Weights   01/17/24 1232 01/25/24 0500  Weight: 63.5 kg 65.2 kg    Examination:  General: No acute distress.  Currently on room air.  Chronically ill and deconditioned looking. ENT/neck: No palpable neck masses or elevated JVD noted  respiratory: Bilateral decreased breath sounds at bases with scattered crackles CVS: Tachycardic; S1 and S2 are heard  abdominal: Soft, nontender, remains slightly distended; no organomegaly, bowel sounds are heard normally Extremities: No clubbing; mild lower extremity edema present  CNS: Alert and oriented.  Still very slow to respond.  Poor historian.  No focal neurologic deficit.  Able to move extremities Lymph: No obvious palpable lymphadenopathy Skin: No obvious petechia/rashes psych: Show no signs of agitation.  Flat affect currently. Musculoskeletal: No obvious joint erythema/tenderness   Data Reviewed: I have personally reviewed following labs and imaging studies  CBC: Recent Labs  Lab 01/21/24 0323 01/22/24 0355 01/23/24 0603 01/24/24 0406 01/25/24 0411  WBC 0.5* 0.5* 0.4* 0.4* 0.6*  NEUTROABS 0.0* 0.0* 0.0* 0.0* 0.0*  HGB 7.5* 7.5* 7.6* 7.3* 7.7*  HCT 23.0* 22.9* 21.5* 21.9* 22.9*  MCV 94.3 93.5 90.3 92.4 93.1  PLT 30* 16* 34* 35* 39*   Basic Metabolic Panel: Recent Labs  Lab 01/20/24 0348 01/21/24 0323 01/22/24 0355 01/23/24 0415 01/24/24 0406 01/25/24 0411  NA 129* 130* 134* 132* 133* 135  K 2.8* 2.8* 2.3* 2.2* 2.6* 3.0*  CL 94* 96* 97* 91* 93* 95*  CO2 25 25 28 26 29 29   GLUCOSE 84 106* 102* 96 116* 101*  BUN 10 9 9 13 19 23   CREATININE 0.54 0.50 0.64 0.49 0.78 0.77  CALCIUM 8.2* 8.0* 8.2* 8.1* 8.0* 8.4*  MG 1.7  --   --   --  1.7 1.7   GFR: Estimated Creatinine Clearance: 52.5 mL/min (by C-G formula based on SCr of 0.77  mg/dL). Liver Function Tests: Recent Labs  Lab 01/20/24 0348 01/21/24 0323 01/22/24 0355 01/23/24 0415 01/24/24 0406  AST 35 28 31 26 20   ALT 33 26 25 24 18   ALKPHOS 344* 287* 286* 267* 225*  BILITOT 1.0 1.0 1.0 1.2 1.0  PROT 5.8* 5.5* 5.9* 6.6 6.1*  ALBUMIN 2.2* 2.0* 2.0* 2.2* 1.9*   No results for input(s): "LIPASE", "AMYLASE" in the last 168 hours. No results for input(s): "AMMONIA" in the last 168 hours. Coagulation Profile: No results for input(s): "INR", "PROTIME" in the last 168 hours.  Cardiac Enzymes: No results for input(s): "CKTOTAL", "CKMB", "CKMBINDEX", "TROPONINI" in the last 168 hours. BNP (last 3 results) No results for input(s): "PROBNP" in the last 8760 hours. HbA1C: No results for input(s): "HGBA1C" in the last 72 hours. CBG: No results for input(s): "GLUCAP" in the last 168 hours. Lipid Profile: No results for input(s): "CHOL", "HDL", "LDLCALC", "  TRIG", "CHOLHDL", "LDLDIRECT" in the last 72 hours. Thyroid  Function Tests: No results for input(s): "TSH", "T4TOTAL", "FREET4", "T3FREE", "THYROIDAB" in the last 72 hours. Anemia Panel: No results for input(s): "VITAMINB12", "FOLATE", "FERRITIN", "TIBC", "IRON", "RETICCTPCT" in the last 72 hours. Sepsis Labs: No results for input(s): "PROCALCITON", "LATICACIDVEN" in the last 168 hours.   Recent Results (from the past 240 hours)  Resp panel by RT-PCR (RSV, Flu A&B, Covid) Anterior Nasal Swab     Status: None   Collection Time: 01/17/24 12:45 PM   Specimen: Anterior Nasal Swab  Result Value Ref Range Status   SARS Coronavirus 2 by RT PCR NEGATIVE NEGATIVE Final    Comment: (NOTE) SARS-CoV-2 target nucleic acids are NOT DETECTED.  The SARS-CoV-2 RNA is generally detectable in upper respiratory specimens during the acute phase of infection. The lowest concentration of SARS-CoV-2 viral copies this assay can detect is 138 copies/mL. A negative result does not preclude SARS-Cov-2 infection and should not be  used as the sole basis for treatment or other patient management decisions. A negative result may occur with  improper specimen collection/handling, submission of specimen other than nasopharyngeal swab, presence of viral mutation(s) within the areas targeted by this assay, and inadequate number of viral copies(<138 copies/mL). A negative result must be combined with clinical observations, patient history, and epidemiological information. The expected result is Negative.  Fact Sheet for Patients:  BloggerCourse.com  Fact Sheet for Healthcare Providers:  SeriousBroker.it  This test is no t yet approved or cleared by the [place]  FDA and  has been authorized for detection and/or diagnosis of SARS-CoV-2 by FDA under an Emergency Use Authorization (EUA). This EUA will remain  in effect (meaning this test can be used) for the duration of the COVID-19 declaration under Section 564(b)(1) of the Act, 21 U.S.C.section 360bbb-3(b)(1), unless the authorization is terminated  or revoked sooner.       Influenza A by PCR NEGATIVE NEGATIVE Final   Influenza B by PCR NEGATIVE NEGATIVE Final    Comment: (NOTE) The Xpert Xpress SARS-CoV-2/FLU/RSV plus assay is intended as an aid in the diagnosis of influenza from Nasopharyngeal swab specimens and should not be used as a sole basis for treatment. Nasal washings and aspirates are unacceptable for Xpert Xpress SARS-CoV-2/FLU/RSV testing.  Fact Sheet for Patients: BloggerCourse.com  Fact Sheet for Healthcare Providers: SeriousBroker.it  This test is not yet approved or cleared by the [place]  FDA and has been authorized for detection and/or diagnosis of SARS-CoV-2 by FDA under an Emergency Use Authorization (EUA). This EUA will remain in effect (meaning this test can be used) for the duration of the COVID-19 declaration under Section  564(b)(1) of the Act, 21 U.S.C. section 360bbb-3(b)(1), unless the authorization is terminated or revoked.     Resp Syncytial Virus by PCR NEGATIVE NEGATIVE Final    Comment: (NOTE) Fact Sheet for Patients: BloggerCourse.com  Fact Sheet for Healthcare Providers: SeriousBroker.it  This test is not yet approved or cleared by the [place]  FDA and has been authorized for detection and/or diagnosis of SARS-CoV-2 by FDA under an Emergency Use Authorization (EUA). This EUA will remain in effect (meaning this test can be used) for the duration of the COVID-19 declaration under Section 564(b)(1) of the Act, 21 U.S.C. section 360bbb-3(b)(1), unless the authorization is terminated or revoked.  Performed at [facility], [address], [place], [place] [address]   Blood culture (routine x 2)     Status: None   Collection Time:  01/17/24  2:50 PM   Specimen: BLOOD  Result Value Ref Range Status   Specimen Description   Final    BLOOD RIGHT ANTECUBITAL Performed at [facility], [address] [place], [place] [address]    Special Requests   Final    BOTTLES DRAWN AEROBIC AND ANAEROBIC Blood Culture results may not be optimal due to an inadequate volume of blood received in culture bottles Performed at [facility], [address] [place], [place] [address]    Culture   Final    NO GROWTH 5 DAYS Performed at [facility], [address], [place], [place] [address]    Report Status 01/22/2024 FINAL  Final  Blood culture (routine x 2)     Status: Abnormal   Collection Time: 01/17/24  2:56 PM   Specimen: BLOOD LEFT HAND  Result Value Ref Range Status   Specimen Description   Final    BLOOD LEFT HAND Performed at [facility], [address], [place], [place] [address]    Special Requests   Final    BOTTLES DRAWN AEROBIC AND ANAEROBIC Blood Culture results may  not be optimal due to an inadequate volume of blood received in culture bottles Performed at [facility], [address], [place], [place] [address]    Culture  Setup Time   Final    GRAM POSITIVE COCCI ANAEROBIC BOTTLE ONLY CRITICAL RESULT CALLED TO, READ BACK BY AND VERIFIED WITH: PHARMD DREW WOFFORD [phone] AT 1100 BY EC    Culture (A)  Final    STAPHYLOCOCCUS HAEMOLYTICUS THE SIGNIFICANCE OF ISOLATING THIS ORGANISM FROM A SINGLE SET OF BLOOD CULTURES WHEN MULTIPLE SETS ARE DRAWN IS UNCERTAIN. PLEASE NOTIFY THE MICROBIOLOGY DEPARTMENT WITHIN ONE WEEK IF SPECIATION AND SENSITIVITIES ARE REQUIRED. Performed at [facility], [address] [place], [place] [address]    Report Status 01/20/2024 FINAL  Final  Blood Culture ID Panel (Reflexed)     Status: Abnormal   Collection Time: 01/17/24  2:56 PM  Result Value Ref Range Status   Enterococcus faecalis NOT DETECTED NOT DETECTED Final   Enterococcus Faecium NOT DETECTED NOT DETECTED Final   Listeria monocytogenes NOT DETECTED NOT DETECTED Final   Staphylococcus species DETECTED (A) NOT DETECTED Final    Comment: CRITICAL RESULT CALLED TO, READ BACK BY AND VERIFIED WITH: PHARMD DREW WOFFORD [phone] AT 1100 BY EC    Staphylococcus aureus (BCID) NOT DETECTED NOT DETECTED Final   Staphylococcus epidermidis NOT DETECTED NOT DETECTED Final   Staphylococcus lugdunensis NOT DETECTED NOT DETECTED Final   Streptococcus species NOT DETECTED NOT DETECTED Final   Streptococcus agalactiae NOT DETECTED NOT DETECTED Final   Streptococcus pneumoniae NOT DETECTED NOT DETECTED Final   Streptococcus pyogenes NOT DETECTED NOT DETECTED Final   A.calcoaceticus-baumannii NOT DETECTED NOT DETECTED Final   Bacteroides fragilis NOT DETECTED NOT DETECTED Final   Enterobacterales NOT DETECTED NOT DETECTED Final   Enterobacter cloacae complex NOT DETECTED NOT DETECTED Final   Escherichia coli NOT DETECTED NOT DETECTED Final    Klebsiella aerogenes NOT DETECTED NOT DETECTED Final   Klebsiella oxytoca NOT DETECTED NOT DETECTED Final   Klebsiella pneumoniae NOT DETECTED NOT DETECTED Final   Proteus species NOT DETECTED NOT DETECTED Final   Salmonella species NOT DETECTED NOT DETECTED Final   Serratia marcescens NOT DETECTED NOT DETECTED Final   Haemophilus influenzae NOT DETECTED NOT DETECTED Final   Neisseria meningitidis NOT DETECTED NOT DETECTED Final  Pseudomonas aeruginosa NOT DETECTED NOT DETECTED Final   Stenotrophomonas maltophilia NOT DETECTED NOT DETECTED Final   Candida albicans NOT DETECTED NOT DETECTED Final   Candida auris NOT DETECTED NOT DETECTED Final   Candida glabrata NOT DETECTED NOT DETECTED Final   Candida krusei NOT DETECTED NOT DETECTED Final   Candida parapsilosis NOT DETECTED NOT DETECTED Final   Candida tropicalis NOT DETECTED NOT DETECTED Final   Cryptococcus neoformans/gattii NOT DETECTED NOT DETECTED Final    Comment: Performed at [facility], [address] [place], [place] [address]  MRSA Next Gen by PCR, Nasal     Status: None   Collection Time: 01/18/24  8:42 AM   Specimen: Nasal Mucosa; Nasal Swab  Result Value Ref Range Status   MRSA by PCR Next Gen NOT DETECTED NOT DETECTED Final    Comment: (NOTE) The GeneXpert MRSA Assay (FDA approved for NASAL specimens only), is one component of a comprehensive MRSA colonization surveillance program. It is not intended to diagnose MRSA infection nor to guide or monitor treatment for MRSA infections. Test performance is not FDA approved in patients less than 80 years old. Performed at [facility], [address] [place], [place] [address]   C Difficile Quick Screen w PCR reflex     Status: None   Collection Time: 01/19/24  9:19 AM   Specimen: STOOL  Result Value Ref Range Status   C Diff antigen NEGATIVE NEGATIVE Final   C Diff toxin NEGATIVE NEGATIVE Final   C Diff interpretation No C. difficile  detected.  Final    Comment: Performed at [facility], [address] [place], [place] [address]  Culture, blood (Routine X 2) w Reflex to ID Panel     Status: None (Preliminary result)   Collection Time: 01/25/24  4:11 AM   Specimen: BLOOD LEFT FOREARM  Result Value Ref Range Status   Specimen Description   Final    BLOOD LEFT FOREARM Performed at [facility], [address], [place], [place] [address]    Special Requests   Final    BOTTLES DRAWN AEROBIC ONLY Blood Culture results may not be optimal due to an inadequate volume of blood received in culture bottles Performed at [facility], [address] [place], [place] [address]    Culture PENDING  Incomplete   Report Status PENDING  Incomplete         Radiology Studies: No results found.       Scheduled Meds:  sodium chloride    Intravenous Once   cyanocobalamin   1,000 mcg Oral Daily   feeding supplement  237 mL Oral TID BM   filgrastim  (NIVESTYM ) SQ  300 mcg Subcutaneous Daily   folic acid   1 mg Oral Daily   levothyroxine   50 mcg Oral Q0600   pantoprazole   40 mg Oral Daily   potassium chloride   40 mEq Oral TID   simvastatin   20 mg Oral QHS   Continuous Infusions:        Audria Leather, MD [facility] 01/25/2024, 8:03 AM  .

## 2024-01-25 NOTE — Progress Notes (Signed)
 PHARMACY - PHYSICIAN COMMUNICATION CRITICAL VALUE ALERT - BLOOD CULTURE IDENTIFICATION (BCID)  Amanda Potter is an 78 y.o. female who presented to [facility] on 01/17/2024 with a chief complaint of severe weakness and cough  Assessment:  1/3 enterococcus faecium, Carloyn Chi A/B detected  Name of physician (or Provider) Contacted: Lorre Rosin  Current antibiotics: cefepime   Changes to prescribed antibiotics recommended:  Start daptomycin  500mg  IV q24h  Results for orders placed or performed during the hospital encounter of 01/17/24  Blood Culture ID Panel (Reflexed) (Collected: 01/25/2024  4:11 AM)  Result Value Ref Range   Enterococcus faecalis NOT DETECTED NOT DETECTED   Enterococcus Faecium DETECTED (A) NOT DETECTED   Listeria monocytogenes NOT DETECTED NOT DETECTED   Staphylococcus species NOT DETECTED NOT DETECTED   Staphylococcus aureus (BCID) PENDING NOT DETECTED   Staphylococcus epidermidis PENDING NOT DETECTED   Staphylococcus lugdunensis PENDING NOT DETECTED   Streptococcus species NOT DETECTED NOT DETECTED   Streptococcus agalactiae NOT DETECTED NOT DETECTED   Streptococcus pneumoniae NOT DETECTED NOT DETECTED   Streptococcus pyogenes NOT DETECTED NOT DETECTED   A.calcoaceticus-baumannii NOT DETECTED NOT DETECTED   Bacteroides fragilis NOT DETECTED NOT DETECTED   Enterobacterales NOT DETECTED NOT DETECTED   Enterobacter cloacae complex NOT DETECTED NOT DETECTED   Escherichia coli NOT DETECTED NOT DETECTED   Klebsiella aerogenes NOT DETECTED NOT DETECTED   Klebsiella oxytoca NOT DETECTED NOT DETECTED   Klebsiella pneumoniae NOT DETECTED NOT DETECTED   Proteus species NOT DETECTED NOT DETECTED   Salmonella species NOT DETECTED NOT DETECTED   Serratia marcescens NOT DETECTED NOT DETECTED   Haemophilus influenzae NOT DETECTED NOT DETECTED   Neisseria meningitidis NOT DETECTED NOT DETECTED   Pseudomonas aeruginosa NOT DETECTED NOT DETECTED   Stenotrophomonas maltophilia NOT DETECTED  NOT DETECTED   Candida albicans NOT DETECTED NOT DETECTED   Candida auris NOT DETECTED NOT DETECTED   Candida glabrata NOT DETECTED NOT DETECTED   Candida krusei NOT DETECTED NOT DETECTED   Candida parapsilosis NOT DETECTED NOT DETECTED   Candida tropicalis NOT DETECTED NOT DETECTED   Cryptococcus neoformans/gattii NOT DETECTED NOT DETECTED   Vancomycin  resistance DETECTED (A) NOT DETECTED    Beau Bound RPh 01/25/2024, 10:23 PM

## 2024-01-25 NOTE — Evaluation (Signed)
 Occupational Therapy Evaluation Patient Details Name: Amanda Potter MRN: [id number] DOB: Dec 31, 1945 Today's Date: 01/25/2024   History of Present Illness   78 yo female admittedwith weakness, neutropenic fever, staph bacteremia. Hx of stage IV NSCLC-palliative chemo     Clinical Impressions PTA, patient was relatively modified independence up until this past January/2025 as per chart review and daughter Amanda Potter's input (via phone) due to significant fatigue this session. Currently, patient presents with deficits outlined below (see OT Problem list for details) most significantly generalized muscle weakness and poor activity tolerance impacting all aspects of BADL's and mobility. Friend present bedside. OT provided positioning for optimal simple self feeding as well as skin protection but patient only able to participate bed level this day. Patient will benefit from continued inpatient follow up therapy, <3 hours/day. OT will continue to follow acutely to progress functional performance.       If plan is discharge home, recommend the following:   Two people to help with walking and/or transfers;Two people to help with bathing/dressing/bathroom;Assistance with cooking/housework;Assistance with feeding;Direct supervision/assist for medications management;Direct supervision/assist for financial management;Assist for transportation;Help with stairs or ramp for entrance     Functional Status Assessment   Patient has had a recent decline in their functional status and demonstrates the ability to make significant improvements in function in a reasonable and predictable amount of time.     Equipment Recommendations   None recommended by OT (family reports having all DME (if home would need a hospital bed))      Precautions/Restrictions   Precautions Precautions: Fall Restrictions Weight Bearing Restrictions Per Provider Order: No     Mobility Bed Mobility Overal bed mobility: Needs  Assistance Bed Mobility: Rolling Rolling: Mod assist, Used rails         General bed mobility comments: patient defferred EOB trial due to fatigue, positioned B LE's on pillow for heel float for skin protection    Transfers  Patient unable to tolerate EOB or OOB this session d/t fatigue                         Balance    TBA when able to tolerate EOB and basic mobility                                        ADL either performed or assessed with clinical judgement   ADL Overall ADL's : Needs assistance/impaired Eating/Feeding: Moderate assistance;Bed level   Grooming: Wash/dry hands;Wash/dry face;Moderate assistance;Bed level   Upper Body Bathing: Maximal assistance;Bed level   Lower Body Bathing: Total assistance;Bed level   Upper Body Dressing : Total assistance;Bed level   Lower Body Dressing: Total assistance;Bed level   Toilet Transfer:  (unable to tolerate)   Toileting- Clothing Manipulation and Hygiene: Total assistance       Functional mobility during ADLs:  (bed level eval only) General ADL Comments: significant fatigue and weakness for light bed level UB ADL's only     Vision Baseline Vision/History: 1 Wears glasses;0 No visual deficits Ability to See in Adequate Light: 0 Adequate Patient Visual Report: No change from baseline Vision Assessment?: Wears glasses for reading;No apparent visual deficits Additional Comments: able to read OT name badge without glassess     Perception Perception: Not tested (testing limited d/t fatigue)       Praxis Praxis: Not tested (testing limited d/t fatigue)  Pertinent Vitals/Pain Pain Assessment Pain Assessment: Faces Faces Pain Scale: No hurt     Extremity/Trunk Assessment Upper Extremity Assessment Upper Extremity Assessment: Right hand dominant;Generalized weakness   Lower Extremity Assessment Lower Extremity Assessment: Generalized weakness   Cervical / Trunk  Assessment Cervical / Trunk Assessment: Other exceptions (fprward head)   Communication Communication Communication: Impaired Factors Affecting Communication: Hearing impaired   Cognition Arousal: Lethargic Behavior During Therapy: Flat affect                                 Following commands: Impaired Following commands impaired: Follows one step commands with increased time     Cueing  General Comments   Cueing Techniques: Verbal cues  extremely low activity tolerance for light UB ADL's and basic bed mobility this session   Exercises Exercises: General Upper Extremity (patient able to complete 10 fist and ankle pumps B UE/LEs)        Home Living Family/patient expects to be discharged to:: Private residence Living Arrangements: Children Available Help at Discharge: Available 24 hours/day;Friend(s);Family Type of Home: House Home Access: Ramped entrance     Home Layout: One level     Bathroom Shower/Tub: Producer, television/film/video: Handicapped height Bathroom Accessibility: Yes How Accessible: Accessible via wheelchair;Accessible via walker Home Equipment: Shower seat;Grab bars - toilet;Cane - quad;Cane - single point;Lift chair;Wheelchair - Forensic psychologist (2 wheels);BSC/3in1   Additional Comments: Pt reports she has rails mounted on the walls of her house as her house was originally built to be a nursing home. Daughter Amanda Potter confirmed via phone      Prior Functioning/Environment Prior Level of Function : Independent/Modified Independent             Mobility Comments: using RW for ambulation ADLs Comments: mod ind with bathing, dressing    OT Problem List: Decreased strength;Decreased activity tolerance;Impaired balance (sitting and/or standing)   OT Treatment/Interventions: Self-care/ADL training;Therapeutic exercise;Energy conservation;DME and/or AE instruction;Therapeutic activities;Patient/family education;Balance training       OT Goals(Current goals can be found in the care plan section)   Acute Rehab OT Goals Patient Stated Goal: dtr: to contiue to assist to get stronger OT Goal Formulation: With family Time For Goal Achievement: 02/08/24 Potential to Achieve Goals: Fair   OT Frequency:  Min 2X/week       AM-PAC OT "6 Clicks" Daily Activity     Outcome Measure Help from another person eating meals?: A Lot Help from another person taking care of personal grooming?: A Lot Help from another person toileting, which includes using toliet, bedpan, or urinal?: Total Help from another person bathing (including washing, rinsing, drying)?: Total Help from another person to put on and taking off regular upper body clothing?: A Lot Help from another person to put on and taking off regular lower body clothing?: Total 6 Click Score: 9   End of Session Nurse Communication: Mobility status  Activity Tolerance: Patient limited by fatigue;Patient limited by lethargy Patient left: in bed;with call bell/phone within reach;with bed alarm set;with family/visitor present  OT Visit Diagnosis: Muscle weakness (generalized) (M62.81)                Time: 9147-8295 OT Time Calculation (min): 22 min Charges:  OT General Charges $OT Visit: 1 Visit OT Evaluation $OT Eval Moderate Complexity: 1 Mod  Regis Hinton OT/L Acute Rehabilitation Department  [phone]  01/25/2024, 1:12 PM

## 2024-01-25 NOTE — Plan of Care (Signed)
   Problem: Education: Goal: Knowledge of General Education information will improve Description Including pain rating scale, medication(s)/side effects and non-pharmacologic comfort measures Outcome: Progressing   Problem: Health Behavior/Discharge Planning: Goal: Ability to manage health-related needs will improve Outcome: Progressing

## 2024-01-25 NOTE — Progress Notes (Signed)
 PT Cancellation Note  Patient Details Name: Amanda Potter MRN: [id number] DOB: 05/22/1946   Cancelled Treatment:    Reason Eval/Treat Not Completed: Fatigue/lethargy limiting ability to participate Per  OT, patient very fatigued with limited  tolerance to treatment. Will check back another day.  Abelina Hoes PT Acute Rehabilitation Services Office [phone] Weekend [phone]   Dareen Ebbing 01/25/2024, 2:56 PM

## 2024-01-25 NOTE — Progress Notes (Signed)
     Patient Name: Amanda Potter           DOB: Apr 23, 1946  MRN: [id number]      Admission Date: 01/17/2024  Attending Provider: Audria Leather, MD  Primary Diagnosis: Sepsis (HCC)   Level of care: Progressive   OVERNIGHT PROGRESS REPORT   Notified by bedside RN of fever 101.7 F.  Fever occurred at 2124 (5/01), notified at 0200 (5/02).   Febrile, recurrent  Patient initially started on broad-spectrum antibiotics with concerns for neutropenic fever and sepsis. ID has discontinued all antibiotics. Sepsis has been ruled out. Staph haemolyticus bacteremia is possibly considered contaminant Currently on RA, no hypoxemia. Denies dizziness, CP, palpitations, SOB, cough, abdominal discomfort, nausea, vomiting, diarrhea.    Plan:  Tylenol  as needed Repeat blood cultures Monitor fever curve and CBC   Bailey Lesser, DNP, ACNPC- AG [facility] [place]

## 2024-01-26 ENCOUNTER — Inpatient Hospital Stay (HOSPITAL_COMMUNITY)

## 2024-01-26 ENCOUNTER — Other Ambulatory Visit: Payer: Self-pay

## 2024-01-26 DIAGNOSIS — A419 Sepsis, unspecified organism: Secondary | ICD-10-CM | POA: Diagnosis not present

## 2024-01-26 DIAGNOSIS — R7881 Bacteremia: Secondary | ICD-10-CM | POA: Diagnosis not present

## 2024-01-26 DIAGNOSIS — B952 Enterococcus as the cause of diseases classified elsewhere: Secondary | ICD-10-CM | POA: Diagnosis not present

## 2024-01-26 DIAGNOSIS — R531 Weakness: Secondary | ICD-10-CM

## 2024-01-26 DIAGNOSIS — R6520 Severe sepsis without septic shock: Secondary | ICD-10-CM | POA: Diagnosis not present

## 2024-01-26 LAB — URINALYSIS, ROUTINE W REFLEX MICROSCOPIC
Bacteria, UA: NONE SEEN
Bilirubin Urine: NEGATIVE
Glucose, UA: NEGATIVE mg/dL
Ketones, ur: NEGATIVE mg/dL
Leukocytes,Ua: NEGATIVE
Nitrite: NEGATIVE
Protein, ur: NEGATIVE mg/dL
Specific Gravity, Urine: 1.009 (ref 1.005–1.030)
pH: 5 (ref 5.0–8.0)

## 2024-01-26 LAB — CBC WITH DIFFERENTIAL/PLATELET
Abs Immature Granulocytes: 0 10*3/uL (ref 0.00–0.07)
Basophils Absolute: 0 10*3/uL (ref 0.0–0.1)
Basophils Relative: 2 %
Eosinophils Absolute: 0 10*3/uL (ref 0.0–0.5)
Eosinophils Relative: 0 %
HCT: 22.8 % — ABNORMAL LOW (ref 36.0–46.0)
Hemoglobin: 7.5 g/dL — ABNORMAL LOW (ref 12.0–15.0)
Immature Granulocytes: 0 %
Lymphocytes Relative: 89 %
Lymphs Abs: 0.4 10*3/uL — ABNORMAL LOW (ref 0.7–4.0)
MCH: 31.1 pg (ref 26.0–34.0)
MCHC: 32.9 g/dL (ref 30.0–36.0)
MCV: 94.6 fL (ref 80.0–100.0)
Monocytes Absolute: 0 10*3/uL — ABNORMAL LOW (ref 0.1–1.0)
Monocytes Relative: 9 %
Neutro Abs: 0 10*3/uL — CL (ref 1.7–7.7)
Neutrophils Relative %: 0 %
Platelets: 57 10*3/uL — ABNORMAL LOW (ref 150–400)
RBC: 2.41 MIL/uL — ABNORMAL LOW (ref 3.87–5.11)
RDW: 14.4 % (ref 11.5–15.5)
WBC: 0.4 10*3/uL — CL (ref 4.0–10.5)
nRBC: 0 % (ref 0.0–0.2)

## 2024-01-26 LAB — BASIC METABOLIC PANEL WITH GFR
Anion gap: 14 (ref 5–15)
BUN: 31 mg/dL — ABNORMAL HIGH (ref 8–23)
CO2: 27 mmol/L (ref 22–32)
Calcium: 8.5 mg/dL — ABNORMAL LOW (ref 8.9–10.3)
Chloride: 98 mmol/L (ref 98–111)
Creatinine, Ser: 0.97 mg/dL (ref 0.44–1.00)
GFR, Estimated: 59 mL/min — ABNORMAL LOW (ref >60)
Glucose, Bld: 112 mg/dL — ABNORMAL HIGH (ref 70–99)
Potassium: 3.4 mmol/L — ABNORMAL LOW (ref 3.5–5.1)
Sodium: 139 mmol/L (ref 135–145)

## 2024-01-26 LAB — GLUCOSE, CAPILLARY: Glucose-Capillary: 104 mg/dL — ABNORMAL HIGH (ref 70–99)

## 2024-01-26 LAB — LACTIC ACID, PLASMA: Lactic Acid, Venous: 1.4 mmol/L (ref 0.5–1.9)

## 2024-01-26 LAB — MAGNESIUM: Magnesium: 1.8 mg/dL (ref 1.7–2.4)

## 2024-01-26 MED ORDER — GLYCOPYRROLATE 0.2 MG/ML IJ SOLN: 0.2000 mg | INTRAMUSCULAR | Status: DC | PRN

## 2024-01-26 MED ORDER — HALOPERIDOL LACTATE 5 MG/ML IJ SOLN
2.0000 mg | INTRAMUSCULAR | Status: DC | PRN
  Administered 2024-01-27: 2 mg via INTRAVENOUS
  Filled 2024-01-26: qty 1

## 2024-01-26 MED ORDER — SODIUM CHLORIDE 0.9% FLUSH: 10.0000 mL | INTRAVENOUS | Status: DC | PRN

## 2024-01-26 MED ORDER — FUROSEMIDE 10 MG/ML IJ SOLN
40.0000 mg | Freq: Once | INTRAMUSCULAR | Status: AC
  Administered 2024-01-26: 40 mg via INTRAVENOUS
  Filled 2024-01-26: qty 4

## 2024-01-26 MED ORDER — GLYCOPYRROLATE 1 MG PO TABS: 1.0000 mg | ORAL_TABLET | ORAL | Status: DC | PRN

## 2024-01-26 MED ORDER — POTASSIUM CHLORIDE 10 MEQ/100ML IV SOLN: 10.0000 meq | INTRAVENOUS | Status: DC

## 2024-01-26 MED ORDER — POTASSIUM CHLORIDE IN NACL 40-0.9 MEQ/L-% IV SOLN
INTRAVENOUS | Status: DC
  Filled 2024-01-26: qty 1000

## 2024-01-26 MED ORDER — METOPROLOL TARTRATE 5 MG/5ML IV SOLN
2.5000 mg | INTRAVENOUS | Status: DC | PRN
  Administered 2024-01-26 (×2): 2.5 mg via INTRAVENOUS
  Filled 2024-01-26 (×2): qty 5

## 2024-01-26 MED ORDER — POLYVINYL ALCOHOL 1.4 % OP SOLN: 1.0000 [drp] | Freq: Four times a day (QID) | OPHTHALMIC | Status: DC | PRN

## 2024-01-26 MED ORDER — MORPHINE SULFATE (PF) 2 MG/ML IV SOLN
1.0000 mg | INTRAVENOUS | Status: DC | PRN
  Administered 2024-01-26 – 2024-01-27 (×2): 1 mg via INTRAVENOUS
  Filled 2024-01-26 (×2): qty 1

## 2024-01-26 MED ORDER — METOPROLOL TARTRATE 5 MG/5ML IV SOLN
2.5000 mg | Freq: Three times a day (TID) | INTRAVENOUS | Status: DC | PRN
  Administered 2024-01-26: 2.5 mg via INTRAVENOUS
  Filled 2024-01-26: qty 5

## 2024-01-26 NOTE — Progress Notes (Signed)
 PROGRESS NOTE    Amanda Potter  QMV:[id number] DOB: May 31, 1946 DOA: 01/17/2024 PCP: Lory Rough., PA-C   Brief Narrative:  78 y.o. female with medical history significant for stage IV non-small cell lung cancer, hypertension, hyperlipidemia on palliative systemic chemotherapy presented with severe weakness, cough.  On presentation, she was tachycardic, leukopenic, pancytopenic, sodium of 125 with elevated lactic acid.  She was started on broad-spectrum antibiotics.  RSV/COVID/influenza PCR negative.  Oncology was consulted and patient was started on Granix .  ID was consulted and antibiotics were discontinued.  Assessment & Plan:   Neutropenic fever Staph hemolyticus bacteremia: Most likely contaminant Enterococcus faecium bacteremia - Patient initially started on broad-spectrum antibiotics with concerns for neutropenic fever and sepsis.  ID has discontinued all antibiotics.  Sepsis has been ruled out.  Staph haemolyticus bacteremia is possibly considered contaminant - No temperature spikes over the last 24 hours.  Blood cultures from 01/25/24 growing gram-positive cocci in pairs and chains. BCID suggestive of Enterococcus faecium.  Cefepime   and daptomycin  started on 01/25/2024.  Follow further ID recommendations.  Remains neutropenic.  Continue Granix  as per oncology till [facility] is above 1500.    Hyponatremia - Improved.  Off salt tablets  Poor oral intake - Continue gentle hydration.  Encourage oral intake  Hypokalemia - Improving.  Stage IV non-small cell lung cancer Pancytopenia Goals of care -Oncology following.  Outpatient follow-up with oncology.  Palliative care following.  CODE STATUS has been changed to DNR - No signs of bleeding.  Monitor platelets and hemoglobin.  Transfuse if hemoglobin is less than 7.  Patient received 1 unit of platelet transfusion on 01/22/2024.  Discontinued aspirin .  GERD - Continue Protonix   Hyperlipidemia -continue  statin  Hypothyroidism -Continue Synthroid   Physical deconditioning - PT recommending SNF placement.  TOC following  DVT prophylaxis: SCDs Code Status: DNR Family Communication: Daughter at bedside on 01/25/24 Disposition Plan: Status is: Inpatient Remains inpatient appropriate because: Of severity of illness  Consultants: Oncology/ID/palliative care  Procedures: None  Antimicrobials:  Anti-infectives (From admission, onward)    Start     Dose/Rate Route Frequency Ordered Stop   01/25/24 2315  DAPTOmycin  (CUBICIN ) IVPB 500 mg/51mL premix        500 mg 100 mL/hr over 30 Minutes Intravenous Daily 01/25/24 2220     01/25/24 0900  ceFEPIme  (MAXIPIME ) 2 g in sodium chloride  0.9 % 100 mL IVPB       Note to Pharmacy: CrCl 53 mL/min   2 g 200 mL/hr over 30 Minutes Intravenous Every 12 hours 01/25/24 0823     01/18/24 0500  metroNIDAZOLE  (FLAGYL ) IVPB 500 mg  Status:  Discontinued        500 mg 100 mL/hr over 60 Minutes Intravenous Every 12 hours 01/18/24 0158 01/21/24 1538   01/18/24 0400  vancomycin  (VANCOCIN ) IVPB 1000 mg/200 mL premix  Status:  Discontinued        1,000 mg 200 mL/hr over 60 Minutes Intravenous Every 24 hours 01/18/24 0158 01/21/24 1538   01/18/24 0300  ceFEPIme  (MAXIPIME ) 2 g in sodium chloride  0.9 % 100 mL IVPB        2 g 200 mL/hr over 30 Minutes Intravenous Every 12 hours 01/18/24 0158 01/22/24 1842   01/17/24 1400  ceFEPIme  (MAXIPIME ) 2 g in sodium chloride  0.9 % 100 mL IVPB        2 g 200 mL/hr over 30 Minutes Intravenous  Once 01/17/24 1357 01/17/24 1621   01/17/24 1400  metroNIDAZOLE  (FLAGYL ) IVPB 500 mg  500 mg 100 mL/hr over 60 Minutes Intravenous  Once 01/17/24 1357 01/17/24 1736   01/17/24 1400  vancomycin  (VANCOCIN ) IVPB 1000 mg/200 mL premix        1,000 mg 200 mL/hr over 60 Minutes Intravenous  Once 01/17/24 1357 01/17/24 1751        Subjective: Patient seen and examined at bedside.  Poor historian.  No seizures, agitation, vomiting  reported.  Oral intake remains extremely poor.    Objective: Vitals:   01/26/24 0021 01/26/24 0427 01/26/24 0500 01/26/24 0802  BP:  (!) 110/58  (!) 129/93  Pulse:  (!) 135  (!) 146  Resp:  20  (!) 24  Temp:  98.2 F (36.8 C)  99.1 F (37.3 C)  TempSrc:  Oral  Oral  SpO2: 93% 95%  (!) 84%  Weight:   62.8 kg   Height:        Intake/Output Summary (Last 24 hours) at 01/26/2024 0823 Last data filed at 01/26/2024 0600 Gross per 24 hour  Intake 60 ml  Output 1400 ml  Net -1340 ml   Filed Weights   01/17/24 1232 01/25/24 0500 01/26/24 0500  Weight: 63.5 kg 65.2 kg 62.8 kg    Examination:  General: On room air.  No distress.  Chronically ill and deconditioned looking. ENT/neck: No JVD elevation or palpable thyromegaly noted  respiratory: Decreased breath sounds at bases bilaterally with intermittent tachypnea and crackles  CVS: 1 S2 heard; remains tachycardic  abdominal: Soft, nontender, distended slightly; no organomegaly, normal bowel sounds heard  extremities: Trace lower extremity edema; no cyanosis  CNS: Extremely slow to respond.  Poor historian.  No obvious focal deficits noted  lymph: No palpable lymphadenopathy Skin: No obvious ecchymosis/lesions psych: Affect is flat.  Not agitated currently.   Musculoskeletal: No obvious joint swelling/deformity  Data Reviewed: I have personally reviewed following labs and imaging studies  CBC: Recent Labs  Lab 01/22/24 0355 01/23/24 0603 01/24/24 0406 01/25/24 0411 01/26/24 0252  WBC 0.5* 0.4* 0.4* 0.6* 0.4*  NEUTROABS 0.0* 0.0* 0.0* 0.0* 0.0*  HGB 7.5* 7.6* 7.3* 7.7* 7.5*  HCT 22.9* 21.5* 21.9* 22.9* 22.8*  MCV 93.5 90.3 92.4 93.1 94.6  PLT 16* 34* 35* 39* 57*   Basic Metabolic Panel: Recent Labs  Lab 01/20/24 0348 01/21/24 0323 01/22/24 0355 01/23/24 0415 01/24/24 0406 01/25/24 0411 01/26/24 0252  NA 129*   < > 134* 132* 133* 135 139  K 2.8*   < > 2.3* 2.2* 2.6* 3.0* 3.4*  CL 94*   < > 97* 91* 93* 95* 98   CO2 25   < > 28 26 29 29 27   GLUCOSE 84   < > 102* 96 116* 101* 112*  BUN 10   < > 9 13 19 23  31*  CREATININE 0.54   < > 0.64 0.49 0.78 0.77 0.97  CALCIUM 8.2*   < > 8.2* 8.1* 8.0* 8.4* 8.5*  MG 1.7  --   --   --  1.7 1.7 1.8   < > = values in this interval not displayed.   GFR: Estimated Creatinine Clearance: 43.3 mL/min (by C-G formula based on SCr of 0.97 mg/dL). Liver Function Tests: Recent Labs  Lab 01/20/24 0348 01/21/24 0323 01/22/24 0355 01/23/24 0415 01/24/24 0406  AST 35 28 31 26 20   ALT 33 26 25 24 18   ALKPHOS 344* 287* 286* 267* 225*  BILITOT 1.0 1.0 1.0 1.2 1.0  PROT 5.8* 5.5* 5.9* 6.6 6.1*  ALBUMIN 2.2* 2.0*  2.0* 2.2* 1.9*   No results for input(s): "LIPASE", "AMYLASE" in the last 168 hours. No results for input(s): "AMMONIA" in the last 168 hours. Coagulation Profile: No results for input(s): "INR", "PROTIME" in the last 168 hours.  Cardiac Enzymes: No results for input(s): "CKTOTAL", "CKMB", "CKMBINDEX", "TROPONINI" in the last 168 hours. BNP (last 3 results) No results for input(s): "PROBNP" in the last 8760 hours. HbA1C: No results for input(s): "HGBA1C" in the last 72 hours. CBG: No results for input(s): "GLUCAP" in the last 168 hours. Lipid Profile: No results for input(s): "CHOL", "HDL", "LDLCALC", "TRIG", "CHOLHDL", "LDLDIRECT" in the last 72 hours. Thyroid  Function Tests: No results for input(s): "TSH", "T4TOTAL", "FREET4", "T3FREE", "THYROIDAB" in the last 72 hours. Anemia Panel: No results for input(s): "VITAMINB12", "FOLATE", "FERRITIN", "TIBC", "IRON", "RETICCTPCT" in the last 72 hours. Sepsis Labs: Recent Labs  Lab 01/26/24 0252  LATICACIDVEN 1.4     Recent Results (from the past 240 hours)  Resp panel by RT-PCR (RSV, Flu A&B, Covid) Anterior Nasal Swab     Status: None   Collection Time: 01/17/24 12:45 PM   Specimen: Anterior Nasal Swab  Result Value Ref Range Status   SARS Coronavirus 2 by RT PCR NEGATIVE NEGATIVE Final     Comment: (NOTE) SARS-CoV-2 target nucleic acids are NOT DETECTED.  The SARS-CoV-2 RNA is generally detectable in upper respiratory specimens during the acute phase of infection. The lowest concentration of SARS-CoV-2 viral copies this assay can detect is 138 copies/mL. A negative result does not preclude SARS-Cov-2 infection and should not be used as the sole basis for treatment or other patient management decisions. A negative result may occur with  improper specimen collection/handling, submission of specimen other than nasopharyngeal swab, presence of viral mutation(s) within the areas targeted by this assay, and inadequate number of viral copies(<138 copies/mL). A negative result must be combined with clinical observations, patient history, and epidemiological information. The expected result is Negative.  Fact Sheet for Patients:  BloggerCourse.com  Fact Sheet for Healthcare Providers:  SeriousBroker.it  This test is no t yet approved or cleared by the [place]  FDA and  has been authorized for detection and/or diagnosis of SARS-CoV-2 by FDA under an Emergency Use Authorization (EUA). This EUA will remain  in effect (meaning this test can be used) for the duration of the COVID-19 declaration under Section 564(b)(1) of the Act, 21 U.S.C.section 360bbb-3(b)(1), unless the authorization is terminated  or revoked sooner.       Influenza A by PCR NEGATIVE NEGATIVE Final   Influenza B by PCR NEGATIVE NEGATIVE Final    Comment: (NOTE) The Xpert Xpress SARS-CoV-2/FLU/RSV plus assay is intended as an aid in the diagnosis of influenza from Nasopharyngeal swab specimens and should not be used as a sole basis for treatment. Nasal washings and aspirates are unacceptable for Xpert Xpress SARS-CoV-2/FLU/RSV testing.  Fact Sheet for Patients: BloggerCourse.com  Fact Sheet for Healthcare  Providers: SeriousBroker.it  This test is not yet approved or cleared by the [place]  FDA and has been authorized for detection and/or diagnosis of SARS-CoV-2 by FDA under an Emergency Use Authorization (EUA). This EUA will remain in effect (meaning this test can be used) for the duration of the COVID-19 declaration under Section 564(b)(1) of the Act, 21 U.S.C. section 360bbb-3(b)(1), unless the authorization is terminated or revoked.     Resp Syncytial Virus by PCR NEGATIVE NEGATIVE Final    Comment: (NOTE) Fact Sheet for Patients: BloggerCourse.com  Fact Sheet for Healthcare Providers:  SeriousBroker.it  This test is not yet approved or cleared by the [place]  FDA and has been authorized for detection and/or diagnosis of SARS-CoV-2 by FDA under an Emergency Use Authorization (EUA). This EUA will remain in effect (meaning this test can be used) for the duration of the COVID-19 declaration under Section 564(b)(1) of the Act, 21 U.S.C. section 360bbb-3(b)(1), unless the authorization is terminated or revoked.  Performed at [facility], [address] [place], [place] [address]   Blood culture (routine x 2)     Status: None   Collection Time: 01/17/24  2:50 PM   Specimen: BLOOD  Result Value Ref Range Status   Specimen Description   Final    BLOOD RIGHT ANTECUBITAL Performed at [facility], [address], [place], [place] [address]    Special Requests   Final    BOTTLES DRAWN AEROBIC AND ANAEROBIC Blood Culture results may not be optimal due to an inadequate volume of blood received in culture bottles Performed at [facility], [address] [place], [place] [address]    Culture   Final    NO GROWTH 5 DAYS Performed at [facility], [address], [place], [place] [address]    Report Status 01/22/2024 FINAL  Final  Blood  culture (routine x 2)     Status: Abnormal   Collection Time: 01/17/24  2:56 PM   Specimen: BLOOD LEFT HAND  Result Value Ref Range Status   Specimen Description   Final    BLOOD LEFT HAND Performed at [facility], [address], [place], [place] [address]    Special Requests   Final    BOTTLES DRAWN AEROBIC AND ANAEROBIC Blood Culture results may not be optimal due to an inadequate volume of blood received in culture bottles Performed at [facility], [address] [place], [place] [address]    Culture  Setup Time   Final    GRAM POSITIVE COCCI ANAEROBIC BOTTLE ONLY CRITICAL RESULT CALLED TO, READ BACK BY AND VERIFIED WITH: PHARMD DREW WOFFORD [id number] AT 1100 BY EC    Culture (A)  Final    STAPHYLOCOCCUS HAEMOLYTICUS THE SIGNIFICANCE OF ISOLATING THIS ORGANISM FROM A SINGLE SET OF BLOOD CULTURES WHEN MULTIPLE SETS ARE DRAWN IS UNCERTAIN. PLEASE NOTIFY THE MICROBIOLOGY DEPARTMENT WITHIN ONE WEEK IF SPECIATION AND SENSITIVITIES ARE REQUIRED. Performed at [facility], [address] [place], [place] [address]    Report Status 01/20/2024 FINAL  Final  Blood Culture ID Panel (Reflexed)     Status: Abnormal   Collection Time: 01/17/24  2:56 PM  Result Value Ref Range Status   Enterococcus faecalis NOT DETECTED NOT DETECTED Final   Enterococcus Faecium NOT DETECTED NOT DETECTED Final   Listeria monocytogenes NOT DETECTED NOT DETECTED Final   Staphylococcus species DETECTED (A) NOT DETECTED Final    Comment: CRITICAL RESULT CALLED TO, READ BACK BY AND VERIFIED WITH: PHARMD DREW WOFFORD [id number] AT 1100 BY EC    Staphylococcus aureus (BCID) NOT DETECTED NOT DETECTED Final   Staphylococcus epidermidis NOT DETECTED NOT DETECTED Final   Staphylococcus lugdunensis NOT DETECTED NOT DETECTED Final   Streptococcus species NOT DETECTED NOT DETECTED Final   Streptococcus agalactiae NOT DETECTED NOT DETECTED Final   Streptococcus pneumoniae NOT DETECTED NOT  DETECTED Final   Streptococcus pyogenes NOT DETECTED NOT DETECTED Final   A.calcoaceticus-baumannii NOT DETECTED NOT DETECTED Final   Bacteroides fragilis NOT DETECTED NOT DETECTED  Final   Enterobacterales NOT DETECTED NOT DETECTED Final   Enterobacter cloacae complex NOT DETECTED NOT DETECTED Final   Escherichia coli NOT DETECTED NOT DETECTED Final   Klebsiella aerogenes NOT DETECTED NOT DETECTED Final   Klebsiella oxytoca NOT DETECTED NOT DETECTED Final   Klebsiella pneumoniae NOT DETECTED NOT DETECTED Final   Proteus species NOT DETECTED NOT DETECTED Final   Salmonella species NOT DETECTED NOT DETECTED Final   Serratia marcescens NOT DETECTED NOT DETECTED Final   Haemophilus influenzae NOT DETECTED NOT DETECTED Final   Neisseria meningitidis NOT DETECTED NOT DETECTED Final   Pseudomonas aeruginosa NOT DETECTED NOT DETECTED Final   Stenotrophomonas maltophilia NOT DETECTED NOT DETECTED Final   Candida albicans NOT DETECTED NOT DETECTED Final   Candida auris NOT DETECTED NOT DETECTED Final   Candida glabrata NOT DETECTED NOT DETECTED Final   Candida krusei NOT DETECTED NOT DETECTED Final   Candida parapsilosis NOT DETECTED NOT DETECTED Final   Candida tropicalis NOT DETECTED NOT DETECTED Final   Cryptococcus neoformans/gattii NOT DETECTED NOT DETECTED Final    Comment: Performed at [facility], [address], [place], [place] [address]  MRSA Next Gen by PCR, Nasal     Status: None   Collection Time: 01/18/24  8:42 AM   Specimen: Nasal Mucosa; Nasal Swab  Result Value Ref Range Status   MRSA by PCR Next Gen NOT DETECTED NOT DETECTED Final    Comment: (NOTE) The GeneXpert MRSA Assay (FDA approved for NASAL specimens only), is one component of a comprehensive MRSA colonization surveillance program. It is not intended to diagnose MRSA infection nor to guide or monitor treatment for MRSA infections. Test performance is not FDA approved in patients less than 63  years old. Performed at [facility], [address] [place], [place] [address]   C Difficile Quick Screen w PCR reflex     Status: None   Collection Time: 01/19/24  9:19 AM   Specimen: STOOL  Result Value Ref Range Status   C Diff antigen NEGATIVE NEGATIVE Final   C Diff toxin NEGATIVE NEGATIVE Final   C Diff interpretation No C. difficile detected.  Final    Comment: Performed at [facility], [address], [place], [place] [address]  Culture, blood (Routine X 2) w Reflex to ID Panel     Status: None (Preliminary result)   Collection Time: 01/25/24  4:11 AM   Specimen: BLOOD LEFT FOREARM  Result Value Ref Range Status   Specimen Description   Final    BLOOD LEFT FOREARM Performed at [facility], [address] [place], [place] [address]    Special Requests   Final    BOTTLES DRAWN AEROBIC ONLY Blood Culture results may not be optimal due to an inadequate volume of blood received in culture bottles Performed at [facility], [address] [place], [place] [address]    Culture   Final    NO GROWTH 1 DAY Performed at [facility], [address], [place], [place] [address]    Report Status PENDING  Incomplete  Culture, blood (Routine X 2) w Reflex to ID Panel     Status: None (Preliminary result)   Collection Time: 01/25/24  4:11 AM   Specimen: BLOOD LEFT HAND  Result Value Ref Range Status   Specimen Description   Final    BLOOD LEFT HAND Performed at [facility], [address] [place], [place]  [address]    Special Requests   Final    BOTTLES DRAWN AEROBIC AND ANAEROBIC Blood Culture results may not be optimal due to an inadequate volume of blood received in culture bottles Performed at [facility], [address] [place], [place] [address]    Culture  Setup Time   Final    GRAM POSITIVE COCCI IN PAIRS IN CHAINS IN BOTH AEROBIC AND ANAEROBIC  BOTTLES CRITICAL RESULT CALLED TO, READ BACK BY AND VERIFIED WITH: PHARMD E. JACKSON [phone] FH Performed at [facility], [address], [place], [place] [address]    Culture GRAM POSITIVE COCCI  Final   Report Status PENDING  Incomplete  Blood Culture ID Panel (Reflexed)     Status: Abnormal (Preliminary result)   Collection Time: 01/25/24  4:11 AM  Result Value Ref Range Status   Enterococcus faecalis NOT DETECTED NOT DETECTED Final   Enterococcus Faecium DETECTED (A) NOT DETECTED Final    Comment: CRITICAL RESULT CALLED TO, READ BACK BY AND VERIFIED WITH: PHARMD ECleora Daft [id number] FH    Listeria monocytogenes NOT DETECTED NOT DETECTED Final   Staphylococcus species NOT DETECTED NOT DETECTED Final   Staphylococcus aureus (BCID) PENDING NOT DETECTED Incomplete   Staphylococcus epidermidis PENDING NOT DETECTED Incomplete   Staphylococcus lugdunensis PENDING NOT DETECTED Incomplete   Streptococcus species NOT DETECTED NOT DETECTED Final   Streptococcus agalactiae NOT DETECTED NOT DETECTED Final   Streptococcus pneumoniae NOT DETECTED NOT DETECTED Final   Streptococcus pyogenes NOT DETECTED NOT DETECTED Final   A.calcoaceticus-baumannii NOT DETECTED NOT DETECTED Final   Bacteroides fragilis NOT DETECTED NOT DETECTED Final   Enterobacterales NOT DETECTED NOT DETECTED Final   Enterobacter cloacae complex NOT DETECTED NOT DETECTED Final   Escherichia coli NOT DETECTED NOT DETECTED Final   Klebsiella aerogenes NOT DETECTED NOT DETECTED Final   Klebsiella oxytoca NOT DETECTED NOT DETECTED Final   Klebsiella pneumoniae NOT DETECTED NOT DETECTED Final   Proteus species NOT DETECTED NOT DETECTED Final   Salmonella species NOT DETECTED NOT DETECTED Final   Serratia marcescens NOT DETECTED NOT DETECTED Final   Haemophilus influenzae NOT DETECTED NOT DETECTED Final   Neisseria meningitidis NOT DETECTED NOT DETECTED Final   Pseudomonas aeruginosa NOT DETECTED NOT DETECTED Final    Stenotrophomonas maltophilia NOT DETECTED NOT DETECTED Final   Candida albicans NOT DETECTED NOT DETECTED Final   Candida auris NOT DETECTED NOT DETECTED Final   Candida glabrata NOT DETECTED NOT DETECTED Final   Candida krusei NOT DETECTED NOT DETECTED Final   Candida parapsilosis NOT DETECTED NOT DETECTED Final   Candida tropicalis NOT DETECTED NOT DETECTED Final   Cryptococcus neoformans/gattii NOT DETECTED NOT DETECTED Final   Vancomycin  resistance DETECTED (A) NOT DETECTED Final    Comment: CRITICAL RESULT CALLED TO, READ BACK BY AND VERIFIED WITH: PHARMD E. JACKSON [phone] FH Performed at [facility], [address], [place], [place] [address]          Radiology Studies: No results found.       Scheduled Meds:  sodium chloride    Intravenous Once   cyanocobalamin   1,000 mcg Oral Daily   feeding supplement  237 mL Oral TID BM   filgrastim  (NIVESTYM ) SQ  300 mcg Subcutaneous Daily   folic acid   1 mg Oral Daily   levothyroxine   50 mcg Oral Q0600   pantoprazole   40 mg Oral Daily   potassium chloride   40 mEq Oral TID   senna-docusate  1 tablet Oral BID   simvastatin   20 mg Oral QHS   Continuous Infusions:  0.9 % NaCl with KCl 40 mEq / L 50 mL/hr at 01/25/24 1201   ceFEPime  (MAXIPIME ) IV 2 g (01/25/24 2141)   DAPTOmycin  500 mg (01/25/24 2250)          Audria Leather, MD [facility] 01/26/2024, 8:23 AM  .

## 2024-01-26 NOTE — Progress Notes (Signed)
 Called to assess Pt arrived to room Pt on  sp02 97% HR 122  RR 21 Pt has productive cough suction done with yankers Pt bitting and appear to be more agitated Pt is not in any respiratory distress.

## 2024-01-26 NOTE — Progress Notes (Signed)
 Pt stable prior to NTS suctioning sp02 97% HR 123 BBS Decreased NTS  done per order and attending request. Pt  suction for small amount thin frothy secretions Sp02 97-100%  during procedure. Pt tolerated well pt family remain at bedside. Discussed NTS and plan of care with provider Bailey Lesser NP. Pt left in No respiratory distress Nursing staff at bedside

## 2024-01-26 NOTE — Progress Notes (Signed)
    Patient Name: Amanda Potter           DOB: 08-26-1946  MRN: [id number]      Admission Date: 01/17/2024  Attending Provider: Audria Leather, MD  Primary Diagnosis: Sepsis [facility])   Level of care: Progressive    CROSS COVER NOTE   Date of Service   01/26/2024   Amanda Potter, 78 y.o. female, was admitted on 01/17/2024 for Sepsis [facility]).    HPI/Events of Note   Called to bedside to assess patient.  RN concerned with pts inability to effectively clear secretions, which is contributing to an increased WOB.   At bedside:  She appears visibly uncomfortable and restless.  She is awake, but unable to follow commands or verbalize discomfort.  O2 requirement inc to 5L. Tachycardic, tachypneic. Breath sounds rhonchi. Weak cough.   The patient is not allowing the RN to suction oral secretions at this time. Will try to NTS PRN.  Thick yellow secretions removed from airway. This has allowed her breathing to relax some, however this will likely recur if the patient is unable to clear her own secretions.   GOC The patient's daughter and POA, Amanda Potter, is at the bedside and has expressed ongoing concerns regarding the patient's condition since admission. She fears that the patient's condition is deteriorating and is worried about the patient's distress. After witnessing the patient's discomfort, Amanda Potter emphasized her desire to prioritize the patient's comfort.  I discussed the option of transitioning to full comfort care measures and addressed any questions Amanda Potter had. She believes that comfort care aligns with the patient's wishes and would like to consult with her brothers before making a final decision. I have assured her that I will be available to support her decision and have requested that the RN keep me updated on any developments.   Addendum: Called to bedside. POA confirmed decision for comfort care only. Orders placed. RN notified of plan.  Appears more comfortable after 1  mg morphine .    Interventions/ Plan   Comfort care measures         Bailey Lesser, DNP, ACNPC- AG [facility]

## 2024-01-26 NOTE — Consult Note (Signed)
 [facility]  Date of Admission:  01/17/2024   Total days of inpatient antibiotics e facium  Principal Problem:   Sepsis (HCC) Active Problems:   Palliative care by specialist   Goals of care, counseling/discussion   Complex care coordination   Medication management   Chemotherapy-induced neutropenia (HCC)   NSCLC of right lung (HCC)   Counseling and coordination of care   Shortness of breath   Need for emotional support   SIRS (systemic inflammatory response syndrome) (HCC)          Assessment: With stage IV lung cancer on palliative systemic chemotherapy last cycle about 2 weeks prior admitted weakness with blood cultures growing staph hemolyticus.  Found to have E faecium bacteremia #E faecium bacteremia 2/2 suspected resp source - ID was initially engaged due to staph hemolyticus and blood culture.  At that time did not suspected infection.  Repeat blood cultures on 5/2 grew 1/2E faecium, VRE.  Patient was started on daptomycin . -CXR showed increased right mid and lower lobe disease on 5/2 Plan: - Continue daptomycin  and cefepime  for now, while Cx mature - Repeat blood cultures  tomorrow -change out PIV -Resp cx - TTE, discussed TEE with daughter at bedside(we can revisit issue once tte resulted)  I have personally spent 52 minutes involved in face-to-face and non-face-to-face activities for this patient on the day of the visit. Professional time spent includes the following activities: Preparing to see the patient (review of tests), Obtaining and/or reviewing separately obtained history (admission/discharge record), Performing a medically appropriate examination and/or evaluation , Ordering medications/tests/procedures, referring and communicating with other health care professionals, Documenting clinical information in the EMR, Independently interpreting results (not separately reported), Communicating results to the  patient/family/caregiver, Counseling and educating the patient/family/caregiver and Care coordination (not separately reported).    Microbiology:   Antibiotics:  Cultures: Blood 5/2 1/2E faecium VRE    SUBJECTIVE: Resting in bed.  Interval:  Tmax 100.3 overnight.  ANC 0. Review of Systems: Review of Systems  All other systems reviewed and are negative.    Scheduled Meds:  sodium chloride    Intravenous Once   cyanocobalamin   1,000 mcg Oral Daily   feeding supplement  237 mL Oral TID BM   filgrastim  (NIVESTYM ) SQ  300 mcg Subcutaneous Daily   folic acid   1 mg Oral Daily   furosemide   40 mg Intravenous Once   levothyroxine   50 mcg Oral Q0600   pantoprazole   40 mg Oral Daily   senna-docusate  1 tablet Oral BID   simvastatin   20 mg Oral QHS   Continuous Infusions:  0.9 % NaCl with KCl 40 mEq / L     ceFEPime  (MAXIPIME ) IV 2 g (01/26/24 0859)   DAPTOmycin  500 mg (01/25/24 2250)   PRN Meds:.acetaminophen  **OR** acetaminophen , albuterol , bisacodyl , guaiFENesin -dextromethorphan , metoprolol tartrate, ondansetron  **OR** ondansetron  (ZOFRAN ) IV, mouth rinse, oxyCODONE , polyethylene glycol Allergies  Allergen Reactions   Latex Rash   Tape Rash    Glue on tape    OBJECTIVE: Vitals:   01/26/24 0802 01/26/24 0829 01/26/24 0852 01/26/24 0908  BP: (!) 129/93  (!) 147/99 (!) 154/85  Pulse: (!) 146  (!) 141 (!) 115  Resp: (!) 24  (!) 24   Temp: 99.1 F (37.3 C)  99.9 F (37.7 C)   TempSrc: Oral  Axillary   SpO2: (!) 84% 96% 97% 95%  Weight:      Height:  Body mass index is 22.35 kg/m.  Physical Exam Constitutional:      Appearance: Normal appearance.  HENT:     Head: Normocephalic and atraumatic.     Right Ear: Tympanic membrane normal.     Left Ear: Tympanic membrane normal.     Nose: Nose normal.     Mouth/Throat:     Mouth: Mucous membranes are moist.  Eyes:     Extraocular Movements: Extraocular movements intact.     Conjunctiva/sclera: Conjunctivae  normal.     Pupils: Pupils are equal, round, and reactive to light.  Cardiovascular:     Rate and Rhythm: Normal rate and regular rhythm.     Heart sounds: No murmur heard.    No friction rub. No gallop.  Pulmonary:     Effort: Pulmonary effort is normal.     Breath sounds: Normal breath sounds.  Abdominal:     General: Abdomen is flat.     Palpations: Abdomen is soft.  Musculoskeletal:        General: Normal range of motion.  Skin:    General: Skin is warm and dry.  Neurological:     General: No focal deficit present.     Mental Status: She is alert and oriented to person, place, and time.  Psychiatric:        Mood and Affect: Mood normal.       Lab Results Lab Results  Component Value Date   WBC 0.4 (LL) 01/26/2024   HGB 7.5 (L) 01/26/2024   HCT 22.8 (L) 01/26/2024   MCV 94.6 01/26/2024   PLT 57 (L) 01/26/2024    Lab Results  Component Value Date   CREATININE 0.97 01/26/2024   BUN 31 (H) 01/26/2024   NA 139 01/26/2024   K 3.4 (L) 01/26/2024   CL 98 01/26/2024   CO2 27 01/26/2024    Lab Results  Component Value Date   ALT 18 01/24/2024   AST 20 01/24/2024   ALKPHOS 225 (H) 01/24/2024   BILITOT 1.0 01/24/2024        Orlie Bjornstad, MD [facility] 01/26/2024, 10:24 AM

## 2024-01-26 NOTE — Progress Notes (Signed)
 Called to pt room by dtg, stating" my mother can't breathe, something in her throat, Pt is DNR/DNI. Pt restless and anxious, showing sighs of shortness of breathe, attempting to cough, weak cough noted. Audible congestion noted near throat area. Pt showing/exhibiting signs of resp distress, sat initially on 2 l, 84 %, increased O2 to 5 liters sat increased to 97%. Attempted to NT suction pt with catheter, unsuccessful, pt biting on catheter tip. RR RN called, RT called , CN notified and AC arrived. MD/NT notified and arrived to provide additional orders and discuss status with family. RT nasal trach suction pt, pt in additionally coughed up large mucous plug. Pt begin to settle down,. Family remain at bedside with pt.

## 2024-01-26 NOTE — Evaluation (Signed)
 Clinical/Bedside Swallow Evaluation Patient Details  Name: Amanda Potter MRN: [id number] Date of Birth: 06-21-1946  Today's Date: 01/26/2024 Time: SLP Start Time (ACUTE ONLY): 1615 SLP Stop Time (ACUTE ONLY): 1635 SLP Time Calculation (min) (ACUTE ONLY): 20 min  Past Medical History:  Past Medical History:  Diagnosis Date   Deviated nasal septum    Environmental allergies    GERD (gastroesophageal reflux disease)    Hyperlipidemia    Hypertension    Perforation of colon (HCC) 2006   Following colonoscopy   Sinusitis, acute    Past Surgical History:  Past Surgical History:  Procedure Laterality Date   CHOLECYSTECTOMY     COLON SURGERY Right 2006   hemicolectomy   COLONOSCOPY WITH PROPOFOL  N/A 05/30/2019   Procedure: COLONOSCOPY WITH PROPOFOL ;  Surgeon: Alvis Jourdain, MD;  Location: [facility];  Service: Endoscopy;  Laterality: N/A;   ENTEROSCOPY N/A 05/30/2019   Procedure: ENTEROSCOPY;  Surgeon: Alvis Jourdain, MD;  Location: [facility];  Service: Endoscopy;  Laterality: N/A;   HERNIA REPAIR  [id number]   INCISIONAL HERNIA REPAIR N/A 03/03/2013   Procedure: LAPAROSCOPIC INCISIONAL HERNIA REPAIR;  Surgeon: Harlee Lichtenstein, MD;  Location: [facility];  Service: General;  Laterality: N/A;  LYSIS OF ADHESIONS FOR 60 MINUTES   INSERTION OF MESH N/A 03/03/2013   Procedure: INSERTION OF MESH;  Surgeon: Harlee Lichtenstein, MD;  Location: [facility];  Service: General;  Laterality: N/A;   IR THORACENTESIS ASP PLEURAL SPACE W/IMG GUIDE  11/13/2023   POLYPECTOMY  05/30/2019   Procedure: POLYPECTOMY;  Surgeon: Alvis Jourdain, MD;  Location: [facility];  Service: Endoscopy;;   TONSILLECTOMY     TUBAL LIGATION     HPI:  Patient is an 78 y.o. female with PMH: stage IV non-small cell lung cancer, HTN, HLD, on palliative systemic chemotherapy, GERD. She presented to the hospital on 01/17/24 with severe weakness and cough. In ED, she was found to be tachycardic, leukopenic, pancytopenic, sodium of 125 with  elevated lactic acid. Palliative care following but patient's family does not wish for comfort measures at this time and goals are ongoing recovery/stablization with hope for SNF rehab. SLP swallow evaluation ordered on 01/25/24 due to family reporting patient was coughing while eating/drinking. She was made NPO on 5/3.    Assessment / Plan / Recommendation  Clinical Impression  Patient presents with clinical s/s of dysphagia as per this bedside swallow evaluation. Currently, she is weak, fatigued, lethargic, confused. Her oral mucosa was very dry, her voice sounds hoarse likely from frequent coughing. She was able to mobilize secretions a couple times that SLP then suctioned out of mouth. She exhibited poor awareness to boluses being presented initially and required cues to attend and engage. Coughing occured after PO's of spoon sips of water were given but also occured prior to and after PO's. Patient's daughter reported that since patient started chemotherapy in February of 2025, she has lost the taste for most foods, except for fruits. Her PO intake since February has been poor and PO intake here at the hospital (before changed to NPO today) was even worse. SLP provided education to family regarding benefit for keeping her oral mucosa clean and moist.Recommendation is for NPO status to continue, allow PRN small amounts ice chips, water sips. SLP will follow. SLP Visit Diagnosis: Dysphagia, unspecified (R13.10)    Aspiration Risk  Severe aspiration risk;Risk for inadequate nutrition/hydration;Moderate aspiration risk    Diet Recommendation NPO;Ice chips PRN after oral care;Free water protocol after oral care  Medication Administration: Via alternative means Supervision: Full supervision/cueing for compensatory strategies Compensations: Slow rate;Small sips/bites Postural Changes: Seated upright at 90 degrees    Other  Recommendations Oral Care Recommendations: Oral care BID;Oral care prior to ice  chip/H20    Recommendations for follow up therapy are one component of a multi-disciplinary discharge planning process, led by the attending physician.  Recommendations may be updated based on patient status, additional functional criteria and insurance authorization.  Follow up Recommendations Follow physician's recommendations for discharge plan and follow up therapies      Assistance Recommended at Discharge    Functional Status Assessment Patient has had a recent decline in their functional status and demonstrates the ability to make significant improvements in function in a reasonable and predictable amount of time.  Frequency and Duration min 2x/week  1 week       Prognosis Prognosis for improved oropharyngeal function: Fair Barriers to Reach Goals: Severity of deficits      Swallow Study   General Date of Onset: 01/25/24 HPI: Patient is an 78 y.o. female with PMH: stage IV non-small cell lung cancer, HTN, HLD, on palliative systemic chemotherapy, GERD. She presented to the hospital on 01/17/24 with severe weakness and cough. In ED, she was found to be tachycardic, leukopenic, pancytopenic, sodium of 125 with elevated lactic acid. Palliative care following but patient's family does not wish for comfort measures at this time and goals are ongoing recovery/stablization with hope for SNF rehab. SLP swallow evaluation ordered on 01/25/24 due to family reporting patient was coughing while eating/drinking. She was made NPO on 5/3. Type of Study: Bedside Swallow Evaluation Previous Swallow Assessment: none found Diet Prior to this Study: NPO Temperature Spikes Noted: Yes (101) Respiratory Status: Room air History of Recent Intubation: No Behavior/Cognition: Alert;Confused;Lethargic/Drowsy;Requires cueing Oral Cavity Assessment: Dry Oral Care Completed by SLP: Yes Oral Cavity - Dentition: Adequate natural dentition Self-Feeding Abilities: Total assist Patient Positioning: Upright in  bed Baseline Vocal Quality: Low vocal intensity;Hoarse Volitional Cough: Weak;Congested Volitional Swallow: Able to elicit    Oral/Motor/Sensory Function Overall Oral Motor/Sensory Function: Within functional limits   Ice Chips     Thin Liquid Thin Liquid: Impaired Presentation: Spoon Oral Phase Impairments: Poor awareness of bolus Pharyngeal  Phase Impairments: Cough - Delayed;Suspected delayed Swallow    Nectar Thick     Honey Thick     Puree Puree: Not tested   Solid     Solid: Not tested      Jacqualine Mater, MA, CCC-SLP Speech Therapy

## 2024-01-26 NOTE — Progress Notes (Signed)
     Patient Name: Amanda Potter           DOB: [birth year]  MRN: [id number]      Admission Date: 01/17/2024  Attending Provider: Audria Leather, MD  Primary Diagnosis: Sepsis (HCC)   Level of care: Progressive   OVERNIGHT PROGRESS REPORT   Concerns for aspiration.  Per family, pt has not been able to tolerate PO solids and liquids as it triggers immediate cough.  Has mild fever and is tachycardic, increase cough.   Currently on cefepime  for febrile neutropenia. BC + 1/3 enterococcus faecium, Van A/B detected. Started on daptomycin .      Plan:  SLP ordered NPO, continue IVF Chest xray Lactic acid - negative     Bailey Lesser, DNP, ACNPC- AG [facility] [place]

## 2024-01-27 ENCOUNTER — Inpatient Hospital Stay (HOSPITAL_COMMUNITY)

## 2024-01-27 DIAGNOSIS — Z515 Encounter for palliative care: Secondary | ICD-10-CM | POA: Diagnosis not present

## 2024-01-27 DIAGNOSIS — C3491 Malignant neoplasm of unspecified part of right bronchus or lung: Secondary | ICD-10-CM | POA: Diagnosis not present

## 2024-01-27 DIAGNOSIS — Z7189 Other specified counseling: Secondary | ICD-10-CM | POA: Diagnosis not present

## 2024-01-27 DIAGNOSIS — R6520 Severe sepsis without septic shock: Secondary | ICD-10-CM | POA: Diagnosis not present

## 2024-01-27 DIAGNOSIS — A419 Sepsis, unspecified organism: Secondary | ICD-10-CM | POA: Diagnosis not present

## 2024-01-27 LAB — CULTURE, BLOOD (ROUTINE X 2)

## 2024-01-27 LAB — PROCALCITONIN: Procalcitonin: <0.1 ng/mL

## 2024-01-27 LAB — COMPREHENSIVE METABOLIC PANEL WITH GFR
ALT: 15 U/L (ref 0–44)
AST: 26 U/L (ref 15–41)
Albumin: 1.6 g/dL — ABNORMAL LOW (ref 3.5–5.0)
Alkaline Phosphatase: 138 U/L — ABNORMAL HIGH (ref 38–126)
Anion gap: 9 (ref 5–15)
BUN: 42 mg/dL — ABNORMAL HIGH (ref 8–23)
CO2: 30 mmol/L (ref 22–32)
Calcium: 8.2 mg/dL — ABNORMAL LOW (ref 8.9–10.3)
Chloride: 104 mmol/L (ref 98–111)
Creatinine, Ser: 1.15 mg/dL — ABNORMAL HIGH (ref 0.44–1.00)
GFR, Estimated: 48 mL/min — ABNORMAL LOW (ref >60)
Glucose, Bld: 104 mg/dL — ABNORMAL HIGH (ref 70–99)
Potassium: 3.4 mmol/L — ABNORMAL LOW (ref 3.5–5.1)
Sodium: 143 mmol/L (ref 135–145)
Total Bilirubin: 0.8 mg/dL (ref 0.0–1.2)
Total Protein: 6 g/dL — ABNORMAL LOW (ref 6.5–8.1)

## 2024-01-27 LAB — URINE CULTURE: Culture: NO GROWTH

## 2024-01-27 LAB — MAGNESIUM: Magnesium: 1.6 mg/dL — ABNORMAL LOW (ref 1.7–2.4)

## 2024-01-27 MED ORDER — MORPHINE SULFATE (PF) 2 MG/ML IV SOLN
1.0000 mg | INTRAVENOUS | Status: DC | PRN
  Administered 2024-01-27: 1 mg via INTRAVENOUS
  Administered 2024-01-27: 2 mg via INTRAVENOUS
  Filled 2024-01-27 (×2): qty 1

## 2024-01-27 MED ORDER — MORPHINE 100MG IN NS 100ML (1MG/ML) PREMIX INFUSION
2.0000 mg/h | INTRAVENOUS | Status: DC
  Administered 2024-01-27: 2 mg/h via INTRAVENOUS
  Filled 2024-01-27: qty 100

## 2024-01-27 MED ORDER — OXYCODONE HCL 5 MG PO TABS: 5.0000 mg | ORAL_TABLET | ORAL | Status: DC | PRN

## 2024-01-27 NOTE — TOC Progression Note (Signed)
 Transition of Care [facility]) - Progression Note    Patient Details  Name: Amanda Potter MRN: [id number] Date of Birth: 1945/09/28  Transition of Care [facility]) CM/SW Contact  Gertha Ku, LCSW Phone Number: 01/27/2024, 12:04 PM  Clinical Narrative:     Pt is now on comfort care. CSW received a consult for residential hospice. CSW spoke with the pt's daughter to offer Choice. The daughter selected [facility]. A referral was sent to Todd Fossa, hospice liaison, for [facility] to review. TOC to follow.  Expected Discharge Plan: Hospice Medical Facility Barriers to Discharge: Continued Medical Work up  Expected Discharge Plan and Services In-house Referral: Clinical Social Work     Living arrangements for the past 2 months: Single Family Home                 DME Arranged: N/A DME Agency: NA                   Social Determinants of Health (SDOH) Interventions SDOH Screenings   Food Insecurity: No Food Insecurity (01/17/2024)  Housing: Low Risk  (01/17/2024)  Transportation Needs: No Transportation Needs (01/17/2024)  Utilities: Not At Risk (01/17/2024)  Social Connections: Socially Isolated (01/17/2024)  Tobacco Use: Low Risk  (01/18/2024)    Readmission Risk Interventions    01/18/2024   11:34 AM 11/12/2023    3:00 PM  Readmission Risk Prevention Plan  Transportation Screening Complete Complete  PCP or Specialist Appt within 5-7 Days  Complete  Home Care Screening  Complete  Medication Review (RN CM)  Complete  HRI or Home Care Consult Complete   Social Work Consult for Recovery Care Planning/Counseling Complete   Palliative Care Screening Not Applicable   Medication Review [employer]) Complete

## 2024-01-27 NOTE — Progress Notes (Signed)
 Amanda Potter [address] [facility] Liaison Note   Referral received from [facility] for family interest in [facility]. Eligibility confirmed. Met with daughter to confirm interest and explained services.    Unfortunately [facility] is unable to offer a bed today.   TOC aware that liaison will follow up tomorrow or sooner if a bed becomes available.    Thank you for the opportunity to participate in this patient's care.   Jacqlyn Matas, BSN, RN Hospice Nurse Liaison [phone]

## 2024-01-27 NOTE — Plan of Care (Signed)

## 2024-01-27 NOTE — Progress Notes (Signed)
 Pt restless at start of shift, intermittent cough noted, pt suctioned to remove large mucous plug. NP Extender assessed pt. Comfort Measures and Morphine  IV given throughout the shift and Haldol 2 mg. Pt Morphine  increased to 2 mg and most effective to calm pt and decrease restless state. DTG remain at bedside with pt. Will continue to monitor. SRP, RN

## 2024-01-27 NOTE — Progress Notes (Signed)
 2D echo attempted, echo no longer needed. Patient is comfort care

## 2024-01-27 NOTE — Progress Notes (Signed)
 PROGRESS NOTE    Amanda Potter  XLK:[id number] DOB: [birth year] DOA: 01/17/2024 PCP: Lory Rough., PA-C   Brief Narrative:  78 y.o. female with medical history significant for stage IV non-small cell lung cancer, hypertension, hyperlipidemia on palliative systemic chemotherapy presented with severe weakness, cough.  On presentation, she was tachycardic, leukopenic, pancytopenic, sodium of 125 with elevated lactic acid.  She was started on broad-spectrum antibiotics.  RSV/COVID/influenza PCR negative.  Oncology was consulted and patient was started on Granix .  ID was consulted and antibiotics were discontinued.  Subsequently, patient's condition did not improve.  She started spiking temperatures again and blood cultures grew Enterococcus.  ID was reconsulted: Patient was started on cefepime  and daptomycin .  Patient continued to remain neutropenic.  Palliative care following.  She had worsening oral intake and worsening oxygen requirement.  Family has decided to pursue comfort measures only.  Assessment & Plan:   Comfort measures only  neutropenic fever Staph hemolyticus bacteremia: Most likely contaminant Enterococcus faecium bacteremia Hyponatremia Poor oral intake Hypokalemia Stage IV non-small cell lung cancer Pancytopenia GERD Hyperlipidemia Hypothyroidism  Plan - As discussed above, family has decided to pursue comfort measures only from overnight of 01/26/2024 because of poor oral intake and worsening oxygen requirement.  Will discontinue other labs and medications and will only pursue comfort measures/  DVT prophylaxis: None for comfort measures Code Status: DNR Family Communication: Daughter at bedside Disposition Plan: Status is: Inpatient Remains inpatient appropriate because: Of severity of illness  Consultants: Oncology/ID/palliative care  Procedures: None  Antimicrobials:  Anti-infectives (From admission, onward)    Start     Dose/Rate Route Frequency Ordered  Stop   01/25/24 2315  DAPTOmycin  (CUBICIN ) IVPB 500 mg/40mL premix        500 mg 100 mL/hr over 30 Minutes Intravenous Daily 01/25/24 2220     01/25/24 0900  ceFEPIme  (MAXIPIME ) 2 g in sodium chloride  0.9 % 100 mL IVPB       Note to Pharmacy: CrCl 53 mL/min   2 g 200 mL/hr over 30 Minutes Intravenous Every 12 hours 01/25/24 0823     01/18/24 0500  metroNIDAZOLE  (FLAGYL ) IVPB 500 mg  Status:  Discontinued        500 mg 100 mL/hr over 60 Minutes Intravenous Every 12 hours 01/18/24 0158 01/21/24 1538   01/18/24 0400  vancomycin  (VANCOCIN ) IVPB 1000 mg/200 mL premix  Status:  Discontinued        1,000 mg 200 mL/hr over 60 Minutes Intravenous Every 24 hours 01/18/24 0158 01/21/24 1538   01/18/24 0300  ceFEPIme  (MAXIPIME ) 2 g in sodium chloride  0.9 % 100 mL IVPB        2 g 200 mL/hr over 30 Minutes Intravenous Every 12 hours 01/18/24 0158 01/22/24 1842   01/17/24 1400  ceFEPIme  (MAXIPIME ) 2 g in sodium chloride  0.9 % 100 mL IVPB        2 g 200 mL/hr over 30 Minutes Intravenous  Once 01/17/24 1357 01/17/24 1621   01/17/24 1400  metroNIDAZOLE  (FLAGYL ) IVPB 500 mg        500 mg 100 mL/hr over 60 Minutes Intravenous  Once 01/17/24 1357 01/17/24 1736   01/17/24 1400  vancomycin  (VANCOCIN ) IVPB 1000 mg/200 mL premix        1,000 mg 200 mL/hr over 60 Minutes Intravenous  Once 01/17/24 1357 01/17/24 1751        Subjective: Patient seen and examined at bedside.  Poor historian.  In no distress currently. Objective: Vitals:  01/26/24 1738 01/26/24 1810 01/26/24 2102 01/26/24 2148  BP:   126/75   Pulse:   (!) 114   Resp: (!) 22 17 (!) 24   Temp:  98 F (36.7 C) 98.3 F (36.8 C)   TempSrc:  Oral Oral   SpO2:   98% 100%  Weight:      Height:        Intake/Output Summary (Last 24 hours) at 01/27/2024 0800 Last data filed at 01/27/2024 0700 Gross per 24 hour  Intake 1325.54 ml  Output 2189 ml  Net -863.46 ml   Filed Weights   01/17/24 1232 01/25/24 0500 01/26/24 0500  Weight: 63.5 kg  65.2 kg 62.8 kg    Examination:  General: Currently in no distress.  Chronically ill and deconditioned looking.  Data Reviewed: I have personally reviewed following labs and imaging studies  CBC: Recent Labs  Lab 01/22/24 0355 01/23/24 0603 01/24/24 0406 01/25/24 0411 01/26/24 0252  WBC 0.5* 0.4* 0.4* 0.6* 0.4*  NEUTROABS 0.0* 0.0* 0.0* 0.0* 0.0*  HGB 7.5* 7.6* 7.3* 7.7* 7.5*  HCT 22.9* 21.5* 21.9* 22.9* 22.8*  MCV 93.5 90.3 92.4 93.1 94.6  PLT 16* 34* 35* 39* 57*   Basic Metabolic Panel: Recent Labs  Lab 01/23/24 0415 01/24/24 0406 01/25/24 0411 01/26/24 0252 01/27/24 0346  NA 132* 133* 135 139 143  K 2.2* 2.6* 3.0* 3.4* 3.4*  CL 91* 93* 95* 98 104  CO2 26 29 29 27 30   GLUCOSE 96 116* 101* 112* 104*  BUN 13 19 23  31* 42*  CREATININE 0.49 0.78 0.77 0.97 1.15*  CALCIUM 8.1* 8.0* 8.4* 8.5* 8.2*  MG  --  1.7 1.7 1.8 1.6*   GFR: Estimated Creatinine Clearance: 36.5 mL/min (A) (by C-G formula based on SCr of 1.15 mg/dL (H)). Liver Function Tests: Recent Labs  Lab 01/21/24 0323 01/22/24 0355 01/23/24 0415 01/24/24 0406 01/27/24 0346  AST 28 31 26 20 26   ALT 26 25 24 18 15   ALKPHOS 287* 286* 267* 225* 138*  BILITOT 1.0 1.0 1.2 1.0 0.8  PROT 5.5* 5.9* 6.6 6.1* 6.0*  ALBUMIN 2.0* 2.0* 2.2* 1.9* 1.6*   No results for input(s): "LIPASE", "AMYLASE" in the last 168 hours. No results for input(s): "AMMONIA" in the last 168 hours. Coagulation Profile: No results for input(s): "INR", "PROTIME" in the last 168 hours.  Cardiac Enzymes: No results for input(s): "CKTOTAL", "CKMB", "CKMBINDEX", "TROPONINI" in the last 168 hours. BNP (last 3 results) No results for input(s): "PROBNP" in the last 8760 hours. HbA1C: No results for input(s): "HGBA1C" in the last 72 hours. CBG: Recent Labs  Lab 01/26/24 2130  GLUCAP 104*   Lipid Profile: No results for input(s): "CHOL", "HDL", "LDLCALC", "TRIG", "CHOLHDL", "LDLDIRECT" in the last 72 hours. Thyroid  Function  Tests: No results for input(s): "TSH", "T4TOTAL", "FREET4", "T3FREE", "THYROIDAB" in the last 72 hours. Anemia Panel: No results for input(s): "VITAMINB12", "FOLATE", "FERRITIN", "TIBC", "IRON", "RETICCTPCT" in the last 72 hours. Sepsis Labs: Recent Labs  Lab 01/26/24 0252 01/27/24 0346  PROCALCITON  --  <0.10  LATICACIDVEN 1.4  --      Recent Results (from the past 240 hours)  Resp panel by RT-PCR (RSV, Flu A&B, Covid) Anterior Nasal Swab     Status: None   Collection Time: 01/17/24 12:45 PM   Specimen: Anterior Nasal Swab  Result Value Ref Range Status   SARS Coronavirus 2 by RT PCR NEGATIVE NEGATIVE Final    Comment: (NOTE) SARS-CoV-2 target nucleic acids are NOT  DETECTED.  The SARS-CoV-2 RNA is generally detectable in upper respiratory specimens during the acute phase of infection. The lowest concentration of SARS-CoV-2 viral copies this assay can detect is 138 copies/mL. A negative result does not preclude SARS-Cov-2 infection and should not be used as the sole basis for treatment or other patient management decisions. A negative result may occur with  improper specimen collection/handling, submission of specimen other than nasopharyngeal swab, presence of viral mutation(s) within the areas targeted by this assay, and inadequate number of viral copies(<138 copies/mL). A negative result must be combined with clinical observations, patient history, and epidemiological information. The expected result is Negative.  Fact Sheet for Patients:  BloggerCourse.com  Fact Sheet for Healthcare Providers:  SeriousBroker.it  This test is no t yet approved or cleared by the [place]  FDA and  has been authorized for detection and/or diagnosis of SARS-CoV-2 by FDA under an Emergency Use Authorization (EUA). This EUA will remain  in effect (meaning this test can be used) for the duration of the COVID-19 declaration under Section  564(b)(1) of the Act, 21 U.S.C.section 360bbb-3(b)(1), unless the authorization is terminated  or revoked sooner.       Influenza A by PCR NEGATIVE NEGATIVE Final   Influenza B by PCR NEGATIVE NEGATIVE Final    Comment: (NOTE) The Xpert Xpress SARS-CoV-2/FLU/RSV plus assay is intended as an aid in the diagnosis of influenza from Nasopharyngeal swab specimens and should not be used as a sole basis for treatment. Nasal washings and aspirates are unacceptable for Xpert Xpress SARS-CoV-2/FLU/RSV testing.  Fact Sheet for Patients: BloggerCourse.com  Fact Sheet for Healthcare Providers: SeriousBroker.it  This test is not yet approved or cleared by the [place]  FDA and has been authorized for detection and/or diagnosis of SARS-CoV-2 by FDA under an Emergency Use Authorization (EUA). This EUA will remain in effect (meaning this test can be used) for the duration of the COVID-19 declaration under Section 564(b)(1) of the Act, 21 U.S.C. section 360bbb-3(b)(1), unless the authorization is terminated or revoked.     Resp Syncytial Virus by PCR NEGATIVE NEGATIVE Final    Comment: (NOTE) Fact Sheet for Patients: BloggerCourse.com  Fact Sheet for Healthcare Providers: SeriousBroker.it  This test is not yet approved or cleared by the [place]  FDA and has been authorized for detection and/or diagnosis of SARS-CoV-2 by FDA under an Emergency Use Authorization (EUA). This EUA will remain in effect (meaning this test can be used) for the duration of the COVID-19 declaration under Section 564(b)(1) of the Act, 21 U.S.C. section 360bbb-3(b)(1), unless the authorization is terminated or revoked.  Performed at [facility], [address] [place], [place] [address]   Blood culture (routine x 2)     Status: None   Collection Time: 01/17/24  2:50 PM   Specimen:  BLOOD  Result Value Ref Range Status   Specimen Description   Final    BLOOD RIGHT ANTECUBITAL Performed at [facility], [address] [place], [place] [address]    Special Requests   Final    BOTTLES DRAWN AEROBIC AND ANAEROBIC Blood Culture results may not be optimal due to an inadequate volume of blood received in culture bottles Performed at [facility], [address] [place], [place] [address]    Culture   Final    NO GROWTH 5 DAYS Performed at [facility], [address] [place], [place] [address]    Report Status 01/22/2024 FINAL  Final  Blood culture (routine x 2)     Status: Abnormal   Collection Time: 01/17/24  2:56 PM   Specimen: BLOOD LEFT HAND  Result Value Ref Range Status   Specimen Description   Final    BLOOD LEFT HAND Performed at [facility], [address] [place], [place] [address]    Special Requests   Final    BOTTLES DRAWN AEROBIC AND ANAEROBIC Blood Culture results may not be optimal due to an inadequate volume of blood received in culture bottles Performed at [facility], [address], [place], [place] [address]    Culture  Setup Time   Final    GRAM POSITIVE COCCI ANAEROBIC BOTTLE ONLY CRITICAL RESULT CALLED TO, READ BACK BY AND VERIFIED WITH: PHARMD DREW WOFFORD [phone] AT 1100 BY EC    Culture (A)  Final    STAPHYLOCOCCUS HAEMOLYTICUS THE SIGNIFICANCE OF ISOLATING THIS ORGANISM FROM A SINGLE SET OF BLOOD CULTURES WHEN MULTIPLE SETS ARE DRAWN IS UNCERTAIN. PLEASE NOTIFY THE MICROBIOLOGY DEPARTMENT WITHIN ONE WEEK IF SPECIATION AND SENSITIVITIES ARE REQUIRED. Performed at [facility], [address] [place], [place] [address]    Report Status 01/20/2024 FINAL  Final  Blood Culture ID Panel (Reflexed)     Status: Abnormal   Collection Time: 01/17/24  2:56 PM  Result Value Ref Range Status   Enterococcus faecalis NOT DETECTED NOT DETECTED Final   Enterococcus  Faecium NOT DETECTED NOT DETECTED Final   Listeria monocytogenes NOT DETECTED NOT DETECTED Final   Staphylococcus species DETECTED (A) NOT DETECTED Final    Comment: CRITICAL RESULT CALLED TO, READ BACK BY AND VERIFIED WITH: PHARMD DREW WOFFORD [phone] AT 1100 BY EC    Staphylococcus aureus (BCID) NOT DETECTED NOT DETECTED Final   Staphylococcus epidermidis NOT DETECTED NOT DETECTED Final   Staphylococcus lugdunensis NOT DETECTED NOT DETECTED Final   Streptococcus species NOT DETECTED NOT DETECTED Final   Streptococcus agalactiae NOT DETECTED NOT DETECTED Final   Streptococcus pneumoniae NOT DETECTED NOT DETECTED Final   Streptococcus pyogenes NOT DETECTED NOT DETECTED Final   A.calcoaceticus-baumannii NOT DETECTED NOT DETECTED Final   Bacteroides fragilis NOT DETECTED NOT DETECTED Final   Enterobacterales NOT DETECTED NOT DETECTED Final   Enterobacter cloacae complex NOT DETECTED NOT DETECTED Final   Escherichia coli NOT DETECTED NOT DETECTED Final   Klebsiella aerogenes NOT DETECTED NOT DETECTED Final   Klebsiella oxytoca NOT DETECTED NOT DETECTED Final   Klebsiella pneumoniae NOT DETECTED NOT DETECTED Final   Proteus species NOT DETECTED NOT DETECTED Final   Salmonella species NOT DETECTED NOT DETECTED Final   Serratia marcescens NOT DETECTED NOT DETECTED Final   Haemophilus influenzae NOT DETECTED NOT DETECTED Final   Neisseria meningitidis NOT DETECTED NOT DETECTED Final   Pseudomonas aeruginosa NOT DETECTED NOT DETECTED Final   Stenotrophomonas maltophilia NOT DETECTED NOT DETECTED Final   Candida albicans NOT DETECTED NOT DETECTED Final   Candida auris NOT DETECTED NOT DETECTED Final   Candida glabrata NOT DETECTED NOT DETECTED Final   Candida krusei NOT DETECTED NOT DETECTED Final   Candida parapsilosis NOT DETECTED NOT DETECTED Final   Candida tropicalis NOT DETECTED NOT DETECTED Final   Cryptococcus neoformans/gattii NOT DETECTED NOT DETECTED Final    Comment: Performed  at [facility], [address], [place], [place] [address]  MRSA Next Gen by PCR, Nasal     Status: None   Collection Time: 01/18/24  8:42 AM   Specimen: Nasal Mucosa;  Nasal Swab  Result Value Ref Range Status   MRSA by PCR Next Gen NOT DETECTED NOT DETECTED Final    Comment: (NOTE) The GeneXpert MRSA Assay (FDA approved for NASAL specimens only), is one component of a comprehensive MRSA colonization surveillance program. It is not intended to diagnose MRSA infection nor to guide or monitor treatment for MRSA infections. Test performance is not FDA approved in patients less than 96 years old. Performed at [facility], [address], [place], [place] [address]   C Difficile Quick Screen w PCR reflex     Status: None   Collection Time: 01/19/24  9:19 AM   Specimen: STOOL  Result Value Ref Range Status   C Diff antigen NEGATIVE NEGATIVE Final   C Diff toxin NEGATIVE NEGATIVE Final   C Diff interpretation No C. difficile detected.  Final    Comment: Performed at [facility], [address], [place], [place] [address]  Culture, blood (Routine X 2) w Reflex to ID Panel     Status: None (Preliminary result)   Collection Time: 01/25/24  4:11 AM   Specimen: BLOOD LEFT FOREARM  Result Value Ref Range Status   Specimen Description   Final    BLOOD LEFT FOREARM Performed at [facility], [address] [place], [place] [address]    Special Requests   Final    BOTTLES DRAWN AEROBIC ONLY Blood Culture results may not be optimal due to an inadequate volume of blood received in culture bottles Performed at [facility], [address], [place], [place] [address]    Culture   Final    NO GROWTH 2 DAYS Performed at [facility], [address] [place], [place] [address]    Report Status PENDING  Incomplete  Culture, blood (Routine X 2) w Reflex to ID Panel     Status: Abnormal (Preliminary result)   Collection  Time: 01/25/24  4:11 AM   Specimen: BLOOD LEFT HAND  Result Value Ref Range Status   Specimen Description   Final    BLOOD LEFT HAND Performed at [facility], [address] [place], [place] [address]    Special Requests   Final    BOTTLES DRAWN AEROBIC AND ANAEROBIC Blood Culture results may not be optimal due to an inadequate volume of blood received in culture bottles Performed at [facility], [address], [place], [place] [address]    Culture  Setup Time   Final    GRAM POSITIVE COCCI IN PAIRS IN CHAINS IN BOTH AEROBIC AND ANAEROBIC BOTTLES CRITICAL RESULT CALLED TO, READ BACK BY AND VERIFIED WITH: PHARMD E. JACKSON [phone] FH    Culture (A)  Final    ENTEROCOCCUS FAECIUM SUSCEPTIBILITIES TO FOLLOW Performed at [facility], [address], [place], [place] [address]    Report Status PENDING  Incomplete  Blood Culture ID Panel (Reflexed)     Status: Abnormal (Preliminary result)   Collection Time: 01/25/24  4:11 AM  Result Value Ref Range Status   Enterococcus faecalis NOT DETECTED NOT DETECTED Final   Enterococcus Faecium DETECTED (A) NOT DETECTED Final    Comment: CRITICAL RESULT CALLED TO, READ BACK BY AND VERIFIED WITH: PHARMD E. JACKSON [phone] FH    Listeria monocytogenes NOT DETECTED NOT DETECTED Final   Staphylococcus species NOT DETECTED NOT DETECTED Final   Staphylococcus aureus (BCID) PENDING NOT DETECTED Incomplete   Staphylococcus epidermidis  PENDING NOT DETECTED Incomplete   Staphylococcus lugdunensis PENDING NOT DETECTED Incomplete   Streptococcus species NOT DETECTED NOT DETECTED Final   Streptococcus agalactiae NOT DETECTED NOT DETECTED Final   Streptococcus pneumoniae NOT DETECTED NOT DETECTED Final   Streptococcus pyogenes NOT DETECTED NOT DETECTED Final   A.calcoaceticus-baumannii NOT DETECTED NOT DETECTED Final   Bacteroides fragilis NOT DETECTED NOT DETECTED Final   Enterobacterales NOT  DETECTED NOT DETECTED Final   Enterobacter cloacae complex NOT DETECTED NOT DETECTED Final   Escherichia coli NOT DETECTED NOT DETECTED Final   Klebsiella aerogenes NOT DETECTED NOT DETECTED Final   Klebsiella oxytoca NOT DETECTED NOT DETECTED Final   Klebsiella pneumoniae NOT DETECTED NOT DETECTED Final   Proteus species NOT DETECTED NOT DETECTED Final   Salmonella species NOT DETECTED NOT DETECTED Final   Serratia marcescens NOT DETECTED NOT DETECTED Final   Haemophilus influenzae NOT DETECTED NOT DETECTED Final   Neisseria meningitidis NOT DETECTED NOT DETECTED Final   Pseudomonas aeruginosa NOT DETECTED NOT DETECTED Final   Stenotrophomonas maltophilia NOT DETECTED NOT DETECTED Final   Candida albicans NOT DETECTED NOT DETECTED Final   Candida auris NOT DETECTED NOT DETECTED Final   Candida glabrata NOT DETECTED NOT DETECTED Final   Candida krusei NOT DETECTED NOT DETECTED Final   Candida parapsilosis NOT DETECTED NOT DETECTED Final   Candida tropicalis NOT DETECTED NOT DETECTED Final   Cryptococcus neoformans/gattii NOT DETECTED NOT DETECTED Final   Vancomycin  resistance DETECTED (A) NOT DETECTED Final    Comment: CRITICAL RESULT CALLED TO, READ BACK BY AND VERIFIED WITH: PHARMD E. JACKSON [phone] FH Performed at [facility], [address], [place], [place] [address]   Culture, blood (Routine X 2) w Reflex to ID Panel     Status: None (Preliminary result)   Collection Time: 01/27/24  3:46 AM   Specimen: BLOOD LEFT FOREARM  Result Value Ref Range Status   Specimen Description   Final    BLOOD LEFT FOREARM Performed at [facility], [address], [place], [place] [address]    Special Requests   Final    BOTTLES DRAWN AEROBIC AND ANAEROBIC Blood Culture results may not be optimal due to an inadequate volume of blood received in culture bottles Performed at [facility], [address], [place], [place] [address]    Culture PENDING   Incomplete   Report Status PENDING  Incomplete         Radiology Studies: DG CHEST PORT 1 VIEW Result Date: 01/26/2024 CLINICAL DATA:  [id number] Aspiration into airway [id number] EXAM: PORTABLE CHEST - 1 VIEW COMPARISON:  CT 01/22/2024, and previous FINDINGS: Moderate hiatal hernia. Somewhat nodular opacities in the right mid and lower lung, increased since previous chest radiograph 01/17/2024. No overt edema. Heart size and mediastinal contours are within normal limits. Aortic Atherosclerosis (ICD10-170.0). No effusion. Visualized bones unremarkable. IMPRESSION: 1. Increasing right mid and lower lung  disease. 2. Moderate hiatal hernia. Electronically Signed   By: Nicoletta Barrier M.D.   On: 01/26/2024 08:38         Scheduled Meds:  sodium chloride    Intravenous Once   cyanocobalamin   1,000 mcg Oral Daily   feeding supplement  237 mL Oral TID BM   filgrastim  (NIVESTYM ) SQ  300 mcg Subcutaneous Daily   folic acid   1 mg Oral Daily   levothyroxine   50 mcg Oral Q0600   pantoprazole   40 mg Oral Daily   senna-docusate  1 tablet Oral BID  simvastatin   20 mg Oral QHS   Continuous Infusions:  ceFEPime  (MAXIPIME ) IV 2 g (01/26/24 2049)   DAPTOmycin  Stopped (01/26/24 1359)          Audria Leather, MD [facility] 01/27/2024, 8:00 AM  .

## 2024-01-27 NOTE — Progress Notes (Signed)
 Pt resting peacefully at present time without distress. Morphine  at 2 mg/hr. Dtg left earlier in shift. Defer moving pt at this time, diminished respirations. Febrile,  removed blankets, will cont to monitor. SRP, RN

## 2024-01-27 NOTE — Progress Notes (Signed)
  Daily Progress Note   Patient Name: Amanda Potter       Date: 01/27/2024 DOB: 12/17/1945  Age: 78 y.o. MRN#: [id number] Attending Physician: Amanda Leather, MD Primary Care Physician: Lory Rough., PA-C Admit Date: 01/17/2024 Length of Stay: 10 days  Reason for Consultation/Follow-up: Establishing goals of care  Subjective:   CC: Patient resting in bed, daughter and son present at bedside this morning Overnight events noted, patient now on comfort measures.  Following up regarding complex medical decision making.  Subjective:  EMR reviewed, patient resting in bed, shallow breathing, occasionally with "gasping" respirations and look of fear.     Objective:   Vital Signs:  BP 126/75   Pulse (!) 114   Temp 98.3 F (36.8 C) (Oral)   Resp (!) 24   Ht 5\' 6"  (1.676 m)   Wt 62.8 kg   SpO2 100%   BMI 22.35 kg/m   Physical Exam: General: NAD, awake, ill-appearing, frail Cardiovascular: RRR Respiratory: increased work of breathing noted, shallow coarse breath sounds Neuro: resting in bed.   Imaging: I personally reviewed recent imaging.   Assessment & Plan:   Assessment: Patient is an 78 year old female with a past medical history of stage IV non-small cell lung cancer on palliative chemotherapy, hypertension, and hyperlipidemia who was admitted on 01/17/2024 for management of neutropenia and severe weakness.  Upon admission, patient found to have sepsis in setting of neutropenic fever.  Unknown source of sepsis though patient having frequent diarrhea.  Patient on broad-spectrum antibiotics.  Patient also receiving management for electrolyte abnormalities.  Oncology consulted for recommendations.  Palliative medicine team consulted to assist with complex medical decision making.  Recommendations/Plan: # Complex medical decision making/goals of care: Patient admitted for management of sepsis in setting of severe neutropenia.  Patient had recently received chemotherapy for  stage IV non-small cell lung cancer.              DNR                 - Patient has ACP documentation already completed on file.  Patient named HCPOA Amanda Potter, primary alternate Amanda Potter, and secondary alternant Amanda Potter                -  Code Status: DNR Hospital course noted.  Overnight events noted.   Now comfort care.  # Symptom management Patient is receiving these palliative interventions for symptom management with an intent to avoid pain and suffering at end of life.                 - Pain/shortness of breath, in setting of stage IV non-small cell lung cancer IV Morphine  infusion, PRN IV Morphine  as well.  Hospice liaison consult for [facility].  Prognosis - some very limited number of days, in my opinion.  # Psycho-social/Spiritual Support:  - Support System: Daughter, son Discussed with daughter Amanda Potter and son who was also at bedside this morning about comfort care. Thank you for allowing the palliative care team to participate in the care Amanda Potter. Mod MDM Amanda Sake MD.  Palliative Care Provider PMT # [phone]  If patient remains symptomatic despite maximum doses, please call PMT at [phone] between 0700 and 1900. Outside of these hours, please call attending, as PMT does not have night coverage.

## 2024-01-28 ENCOUNTER — Other Ambulatory Visit: Payer: Self-pay

## 2024-01-28 ENCOUNTER — Telehealth: Payer: Self-pay | Admitting: Medical Oncology

## 2024-01-28 DIAGNOSIS — C3491 Malignant neoplasm of unspecified part of right bronchus or lung: Secondary | ICD-10-CM | POA: Diagnosis not present

## 2024-01-28 DIAGNOSIS — Z515 Encounter for palliative care: Secondary | ICD-10-CM | POA: Diagnosis not present

## 2024-01-28 DIAGNOSIS — T451X5A Adverse effect of antineoplastic and immunosuppressive drugs, initial encounter: Secondary | ICD-10-CM | POA: Diagnosis not present

## 2024-01-28 DIAGNOSIS — D701 Agranulocytosis secondary to cancer chemotherapy: Secondary | ICD-10-CM | POA: Diagnosis not present

## 2024-01-29 LAB — BLOOD CULTURE ID PANEL (REFLEXED) - BCID2
A.calcoaceticus-baumannii: NOT DETECTED
Bacteroides fragilis: NOT DETECTED
Candida albicans: NOT DETECTED
Candida auris: NOT DETECTED
Candida glabrata: NOT DETECTED
Candida krusei: NOT DETECTED
Candida parapsilosis: NOT DETECTED
Candida tropicalis: NOT DETECTED
Cryptococcus neoformans/gattii: NOT DETECTED
Enterobacter cloacae complex: NOT DETECTED
Enterobacterales: NOT DETECTED
Enterococcus Faecium: DETECTED — AB
Enterococcus faecalis: NOT DETECTED
Escherichia coli: NOT DETECTED
Haemophilus influenzae: NOT DETECTED
Klebsiella aerogenes: NOT DETECTED
Klebsiella oxytoca: NOT DETECTED
Klebsiella pneumoniae: NOT DETECTED
Listeria monocytogenes: NOT DETECTED
Neisseria meningitidis: NOT DETECTED
Proteus species: NOT DETECTED
Pseudomonas aeruginosa: NOT DETECTED
Salmonella species: NOT DETECTED
Serratia marcescens: NOT DETECTED
Staphylococcus species: NOT DETECTED
Stenotrophomonas maltophilia: NOT DETECTED
Streptococcus agalactiae: NOT DETECTED
Streptococcus pneumoniae: NOT DETECTED
Streptococcus pyogenes: NOT DETECTED
Streptococcus species: NOT DETECTED
Vancomycin resistance: DETECTED — AB

## 2024-01-30 ENCOUNTER — Inpatient Hospital Stay

## 2024-01-30 ENCOUNTER — Ambulatory Visit: Admitting: "Endocrinology

## 2024-01-30 LAB — CULTURE, BLOOD (ROUTINE X 2)

## 2024-01-31 ENCOUNTER — Other Ambulatory Visit

## 2024-01-31 ENCOUNTER — Ambulatory Visit: Payer: Self-pay

## 2024-01-31 ENCOUNTER — Ambulatory Visit: Payer: Self-pay | Admitting: Internal Medicine

## 2024-02-01 LAB — CULTURE, BLOOD (ROUTINE X 2): Culture: NO GROWTH

## 2024-02-06 ENCOUNTER — Inpatient Hospital Stay

## 2024-02-13 ENCOUNTER — Ambulatory Visit

## 2024-02-13 ENCOUNTER — Ambulatory Visit: Admitting: Physician Assistant

## 2024-02-13 ENCOUNTER — Other Ambulatory Visit

## 2024-02-21 ENCOUNTER — Ambulatory Visit

## 2024-02-21 ENCOUNTER — Other Ambulatory Visit

## 2024-02-21 ENCOUNTER — Ambulatory Visit: Payer: Self-pay | Admitting: Internal Medicine

## 2024-02-24 NOTE — Progress Notes (Signed)
 PROGRESS NOTE    Amanda Potter  ZOX:[id number] DOB: 06/26/1946 DOA: 01/17/2024 PCP: Lory Rough., PA-C   Brief Narrative:  78 y.o. female with medical history significant for stage IV non-small cell lung cancer, hypertension, hyperlipidemia on palliative systemic chemotherapy presented with severe weakness, cough.  On presentation, she was tachycardic, leukopenic, pancytopenic, sodium of 125 with elevated lactic acid.  She was started on broad-spectrum antibiotics.  RSV/COVID/influenza PCR negative.  Oncology was consulted and patient was started on Granix .  ID was consulted and antibiotics were discontinued.  Subsequently, patient's condition did not improve.  She started spiking temperatures again and blood cultures grew Enterococcus.  ID was reconsulted: Patient was started on cefepime  and daptomycin .  Patient continued to remain neutropenic.  Palliative care following.  She had worsening oral intake and worsening oxygen requirement.  Family has decided to pursue comfort measures only.  Assessment & Plan:   Comfort measures only  neutropenic fever Staph hemolyticus bacteremia: Most likely contaminant Enterococcus faecium bacteremia Hyponatremia Poor oral intake Hypokalemia Stage IV non-small cell lung cancer Pancytopenia GERD Hyperlipidemia Hypothyroidism  Plan - As discussed above, family has decided to pursue comfort measures only from overnight of 01/26/2024 because of poor oral intake and worsening oxygen requirement.  Palliative care following.  Continue comfort measures.  DVT prophylaxis: None for comfort measures Code Status: DNR Family Communication: Daughter at bedside Disposition Plan: Status is: Inpatient Remains inpatient appropriate because: Of severity of illness.  Consultants: Oncology/ID/palliative care  Procedures: None  Antimicrobials:  Anti-infectives (From admission, onward)    Start     Dose/Rate Route Frequency Ordered Stop   01/25/24 2315   DAPTOmycin  (CUBICIN ) IVPB 500 mg/50mL premix  Status:  Discontinued        500 mg 100 mL/hr over 30 Minutes Intravenous Daily 01/25/24 2220 01/27/24 1007   01/25/24 0900  ceFEPIme  (MAXIPIME ) 2 g in sodium chloride  0.9 % 100 mL IVPB  Status:  Discontinued       Note to Pharmacy: CrCl 53 mL/min   2 g 200 mL/hr over 30 Minutes Intravenous Every 12 hours 01/25/24 0823 01/27/24 1007   01/18/24 0500  metroNIDAZOLE  (FLAGYL ) IVPB 500 mg  Status:  Discontinued        500 mg 100 mL/hr over 60 Minutes Intravenous Every 12 hours 01/18/24 0158 01/21/24 1538   01/18/24 0400  vancomycin  (VANCOCIN ) IVPB 1000 mg/200 mL premix  Status:  Discontinued        1,000 mg 200 mL/hr over 60 Minutes Intravenous Every 24 hours 01/18/24 0158 01/21/24 1538   01/18/24 0300  ceFEPIme  (MAXIPIME ) 2 g in sodium chloride  0.9 % 100 mL IVPB        2 g 200 mL/hr over 30 Minutes Intravenous Every 12 hours 01/18/24 0158 01/22/24 1842   01/17/24 1400  ceFEPIme  (MAXIPIME ) 2 g in sodium chloride  0.9 % 100 mL IVPB        2 g 200 mL/hr over 30 Minutes Intravenous  Once 01/17/24 1357 01/17/24 1621   01/17/24 1400  metroNIDAZOLE  (FLAGYL ) IVPB 500 mg        500 mg 100 mL/hr over 60 Minutes Intravenous  Once 01/17/24 1357 01/17/24 1736   01/17/24 1400  vancomycin  (VANCOCIN ) IVPB 1000 mg/200 mL premix        1,000 mg 200 mL/hr over 60 Minutes Intravenous  Once 01/17/24 1357 01/17/24 1751        Subjective: Patient seen and examined at bedside.  Currently in no distress.   Objective: Vitals:  01/26/24 2102 01/26/24 2148 01/27/24 2134 01/31/2024 0638  BP: 126/75  (!) 66/45 (!) 72/37  Pulse: (!) 114  (!) 142 (!) 129  Resp: (!) 24  16 14   Temp: 98.3 F (36.8 C)  (!) 102.2 F (39 C) 98.5 F (36.9 C)  TempSrc: Oral  Oral Oral  SpO2: 98% 100% (!) 79% 91%  Weight:      Height:        Intake/Output Summary (Last 24 hours) at 02/09/2024 0745 Last data filed at 02/10/2024 0600 Gross per 24 hour  Intake 23.37 ml  Output 100 ml   Net -76.63 ml   Filed Weights   01/17/24 1232 01/25/24 0500 01/26/24 0500  Weight: 63.5 kg 65.2 kg 62.8 kg    Examination:  General: No acute distress.  Chronically ill and deconditioned looking.  Data Reviewed: I have personally reviewed following labs and imaging studies  CBC: Recent Labs  Lab 01/22/24 0355 01/23/24 0603 01/24/24 0406 01/25/24 0411 01/26/24 0252  WBC 0.5* 0.4* 0.4* 0.6* 0.4*  NEUTROABS 0.0* 0.0* 0.0* 0.0* 0.0*  HGB 7.5* 7.6* 7.3* 7.7* 7.5*  HCT 22.9* 21.5* 21.9* 22.9* 22.8*  MCV 93.5 90.3 92.4 93.1 94.6  PLT 16* 34* 35* 39* 57*   Basic Metabolic Panel: Recent Labs  Lab 01/23/24 0415 01/24/24 0406 01/25/24 0411 01/26/24 0252 01/27/24 0346  NA 132* 133* 135 139 143  K 2.2* 2.6* 3.0* 3.4* 3.4*  CL 91* 93* 95* 98 104  CO2 26 29 29 27 30   GLUCOSE 96 116* 101* 112* 104*  BUN 13 19 23  31* 42*  CREATININE 0.49 0.78 0.77 0.97 1.15*  CALCIUM 8.1* 8.0* 8.4* 8.5* 8.2*  MG  --  1.7 1.7 1.8 1.6*   GFR: Estimated Creatinine Clearance: 36.5 mL/min (A) (by C-G formula based on SCr of 1.15 mg/dL (H)). Liver Function Tests: Recent Labs  Lab 01/22/24 0355 01/23/24 0415 01/24/24 0406 01/27/24 0346  AST 31 26 20 26   ALT 25 24 18 15   ALKPHOS 286* 267* 225* 138*  BILITOT 1.0 1.2 1.0 0.8  PROT 5.9* 6.6 6.1* 6.0*  ALBUMIN 2.0* 2.2* 1.9* 1.6*   No results for input(s): "LIPASE", "AMYLASE" in the last 168 hours. No results for input(s): "AMMONIA" in the last 168 hours. Coagulation Profile: No results for input(s): "INR", "PROTIME" in the last 168 hours.  Cardiac Enzymes: No results for input(s): "CKTOTAL", "CKMB", "CKMBINDEX", "TROPONINI" in the last 168 hours. BNP (last 3 results) No results for input(s): "PROBNP" in the last 8760 hours. HbA1C: No results for input(s): "HGBA1C" in the last 72 hours. CBG: Recent Labs  Lab 01/26/24 2130  GLUCAP 104*   Lipid Profile: No results for input(s): "CHOL", "HDL", "LDLCALC", "TRIG", "CHOLHDL",  "LDLDIRECT" in the last 72 hours. Thyroid  Function Tests: No results for input(s): "TSH", "T4TOTAL", "FREET4", "T3FREE", "THYROIDAB" in the last 72 hours. Anemia Panel: No results for input(s): "VITAMINB12", "FOLATE", "FERRITIN", "TIBC", "IRON", "RETICCTPCT" in the last 72 hours. Sepsis Labs: Recent Labs  Lab 01/26/24 0252 01/27/24 0346  PROCALCITON  --  <0.10  LATICACIDVEN 1.4  --      Recent Results (from the past 240 hours)  MRSA Next Gen by PCR, Nasal     Status: None   Collection Time: 01/18/24  8:42 AM   Specimen: Nasal Mucosa; Nasal Swab  Result Value Ref Range Status   MRSA by PCR Next Gen NOT DETECTED NOT DETECTED Final    Comment: (NOTE) The GeneXpert MRSA Assay (FDA approved for NASAL specimens  only), is one component of a comprehensive MRSA colonization surveillance program. It is not intended to diagnose MRSA infection nor to guide or monitor treatment for MRSA infections. Test performance is not FDA approved in patients less than 69 years old. Performed at [facility], [address] [place], [place] [address]   C Difficile Quick Screen w PCR reflex     Status: None   Collection Time: 01/19/24  9:19 AM   Specimen: STOOL  Result Value Ref Range Status   C Diff antigen NEGATIVE NEGATIVE Final   C Diff toxin NEGATIVE NEGATIVE Final   C Diff interpretation No C. difficile detected.  Final    Comment: Performed at [facility], [address], [place], [place] [address]  Culture, blood (Routine X 2) w Reflex to ID Panel     Status: None (Preliminary result)   Collection Time: 01/25/24  4:11 AM   Specimen: BLOOD LEFT FOREARM  Result Value Ref Range Status   Specimen Description   Final    BLOOD LEFT FOREARM Performed at [facility], [address], [place], [place] [address]    Special Requests   Final    BOTTLES DRAWN AEROBIC ONLY Blood Culture results may not be optimal due to an inadequate volume of blood  received in culture bottles Performed at [facility], [address] [place], [place] [address]    Culture   Final    NO GROWTH 2 DAYS Performed at [facility], [address] [place], [place] [address]    Report Status PENDING  Incomplete  Culture, blood (Routine X 2) w Reflex to ID Panel     Status: Abnormal   Collection Time: 01/25/24  4:11 AM   Specimen: BLOOD LEFT HAND  Result Value Ref Range Status   Specimen Description   Final    BLOOD LEFT HAND Performed at [facility], [address], [place], [place] [address]    Special Requests   Final    BOTTLES DRAWN AEROBIC AND ANAEROBIC Blood Culture results may not be optimal due to an inadequate volume of blood received in culture bottles Performed at [facility], [address], [place], [place] [address]    Culture  Setup Time   Final    GRAM POSITIVE COCCI IN PAIRS IN CHAINS IN BOTH AEROBIC AND ANAEROBIC BOTTLES CRITICAL RESULT CALLED TO, READ BACK BY AND VERIFIED WITH: PHARMD E. JACKSON [phone] Performed at [facility], [address] [place], [place] [address]    Culture (A)  Final    ENTEROCOCCUS FAECIUM VANCOMYCIN  RESISTANT ENTEROCOCCUS    Report Status 01/27/2024 FINAL  Final   Organism ID, Bacteria ENTEROCOCCUS FAECIUM  Final      Susceptibility   Enterococcus faecium - MIC*    AMPICILLIN  >=32 RESISTANT Resistant     VANCOMYCIN  >=32 RESISTANT Resistant     GENTAMICIN SYNERGY SENSITIVE Sensitive     * ENTEROCOCCUS FAECIUM  Blood Culture ID Panel (Reflexed)     Status: Abnormal (Preliminary result)   Collection Time: 01/25/24  4:11 AM  Result Value Ref Range Status   Enterococcus faecalis NOT DETECTED NOT DETECTED Final   Enterococcus Faecium DETECTED (A) NOT DETECTED Final    Comment: CRITICAL RESULT CALLED TO, READ BACK BY AND VERIFIED WITH: PHARMD E. JACKSON [phone]    Listeria monocytogenes NOT DETECTED NOT DETECTED  Final   Staphylococcus species NOT  DETECTED NOT DETECTED Final   Staphylococcus aureus (BCID) PENDING NOT DETECTED Incomplete   Staphylococcus epidermidis PENDING NOT DETECTED Incomplete   Staphylococcus lugdunensis PENDING NOT DETECTED Incomplete   Streptococcus species NOT DETECTED NOT DETECTED Final   Streptococcus agalactiae NOT DETECTED NOT DETECTED Final   Streptococcus pneumoniae NOT DETECTED NOT DETECTED Final   Streptococcus pyogenes NOT DETECTED NOT DETECTED Final   A.calcoaceticus-baumannii NOT DETECTED NOT DETECTED Final   Bacteroides fragilis NOT DETECTED NOT DETECTED Final   Enterobacterales NOT DETECTED NOT DETECTED Final   Enterobacter cloacae complex NOT DETECTED NOT DETECTED Final   Escherichia coli NOT DETECTED NOT DETECTED Final   Klebsiella aerogenes NOT DETECTED NOT DETECTED Final   Klebsiella oxytoca NOT DETECTED NOT DETECTED Final   Klebsiella pneumoniae NOT DETECTED NOT DETECTED Final   Proteus species NOT DETECTED NOT DETECTED Final   Salmonella species NOT DETECTED NOT DETECTED Final   Serratia marcescens NOT DETECTED NOT DETECTED Final   Haemophilus influenzae NOT DETECTED NOT DETECTED Final   Neisseria meningitidis NOT DETECTED NOT DETECTED Final   Pseudomonas aeruginosa NOT DETECTED NOT DETECTED Final   Stenotrophomonas maltophilia NOT DETECTED NOT DETECTED Final   Candida albicans NOT DETECTED NOT DETECTED Final   Candida auris NOT DETECTED NOT DETECTED Final   Candida glabrata NOT DETECTED NOT DETECTED Final   Candida krusei NOT DETECTED NOT DETECTED Final   Candida parapsilosis NOT DETECTED NOT DETECTED Final   Candida tropicalis NOT DETECTED NOT DETECTED Final   Cryptococcus neoformans/gattii NOT DETECTED NOT DETECTED Final   Vancomycin  resistance DETECTED (A) NOT DETECTED Final    Comment: CRITICAL RESULT CALLED TO, READ BACK BY AND VERIFIED WITH: PHARMD E. JACKSON [phone] Performed at [facility], [address], [place], [place]  [address]   Urine Culture (for pregnant, neutropenic or urologic patients or patients with an indwelling urinary catheter)     Status: None   Collection Time: 01/26/24  5:30 PM   Specimen: Urine, Catheterized  Result Value Ref Range Status   Specimen Description   Final    URINE, CATHETERIZED Performed at [facility], [address] [place], [place] [address]    Special Requests   Final    NONE Performed at [facility], [address] [place], [place] [address]    Culture   Final    NO GROWTH Performed at [facility], [address], [place], [place] [address]    Report Status 01/27/2024 FINAL  Final  Culture, blood (Routine X 2) w Reflex to ID Panel     Status: None (Preliminary result)   Collection Time: 01/27/24  3:46 AM   Specimen: BLOOD LEFT FOREARM  Result Value Ref Range Status   Specimen Description   Final    BLOOD LEFT FOREARM Performed at [facility], [address] [place], [place] [address]    Special Requests   Final    BOTTLES DRAWN AEROBIC AND ANAEROBIC Blood Culture results may not be optimal due to an inadequate volume of blood received in culture bottles Performed at [facility], [address] [place], [place] [address]    Culture PENDING  Incomplete   Report Status PENDING  Incomplete         Radiology Studies: No results found.        Scheduled Meds:  sodium chloride    Intravenous Once   senna-docusate  1 tablet Oral BID   Continuous Infusions:  morphine   2 mg/hr (01/27/24 1518)          Audria Leather, MD [facility] 02/15/2024, 7:45 AM  .

## 2024-02-24 NOTE — Plan of Care (Signed)
   Problem: Education: Goal: Knowledge of General Education information will improve Description Including pain rating scale, medication(s)/side effects and non-pharmacologic comfort measures Outcome: Progressing

## 2024-02-24 NOTE — Progress Notes (Signed)
  Daily Progress Note   Patient Name: Amanda Potter       Date: 02/16/2024 DOB: [birth year]  Age: 78 y.o. MRN#: [id number] Attending Physician: Amanda Leather, MD Primary Care Physician: Amanda Potter., PA-C Admit Date: 01/17/2024 Length of Stay: 11 days  Reason for Consultation/Follow-up: Establishing goals of care  Subjective:   CC: Patient seen earlier this morning, actively dying.     Subjective:  EMR reviewed, patient unresponsive    Objective:   Vital Signs:  BP (!) 72/37 (BP Location: Right Arm)   Pulse (!) 129   Temp 98.5 F (36.9 C) (Oral)   Resp 14   Ht 5\' 6"  (1.676 m)   Wt 62.8 kg   SpO2 91%   BMI 22.35 kg/m   Physical Exam: General: unresponsive, frail Cardiovascular: RRR Respiratory: shallow apneic spells Neuro: resting in bed.   Imaging: I personally reviewed recent imaging.   Assessment & Plan:   Assessment: Patient is an 78 year old female with a past medical history of stage IV non-small cell lung cancer on palliative chemotherapy, hypertension, and hyperlipidemia who was admitted on 01/17/2024 for management of neutropenia and severe weakness.  Upon admission, patient found to have sepsis in setting of neutropenic fever.  Unknown source of sepsis though patient having frequent diarrhea.  Patient on broad-spectrum antibiotics.  Patient also receiving management for electrolyte abnormalities.  Oncology consulted for recommendations.  Palliative medicine team consulted to assist with complex medical decision making.  Recommendations/Plan: # Complex medical decision making/goals of care: Patient admitted for management of sepsis in setting of severe neutropenia.  Patient had recently received chemotherapy for stage IV non-small cell lung cancer.              DNR                 - Patient has ACP documentation already completed on file.  Patient named HCPOA Amanda Potter, primary alternate Amanda Potter, and secondary alternant Amanda Potter                 -  Code Status: DNR Hospital course noted.  Overnight events noted.   Now comfort care.  # Symptom management Patient is receiving these palliative interventions for symptom management with an intent to avoid pain and suffering at end of life.                 - Pain/shortness of breath, in setting of stage IV non-small cell lung cancer IV Morphine  infusion, PRN IV Morphine  as well.  Hospice liaison consult for [facility].  Prognosis - some very limited number of days, in my opinion.  # Psycho-social/Spiritual Support:  - Support System: Daughter, son   Thank you for allowing the palliative care team to participate in the care Amanda Potter. mod MDM Amanda Sake MD.  Palliative Care Provider PMT # [phone]  If patient remains symptomatic despite maximum doses, please call PMT at [phone] between 0700 and 1900. Outside of these hours, please call attending, as PMT does not have night coverage.   Addendum: Notified that patient has been pronounced dead. Condolences will be offered and all appropriate paperwork will be duly filled out.

## 2024-02-24 NOTE — Death Summary Note (Signed)
 Death Summary  Amanda Potter WGN:[id number] DOB: [birth year] DOA: 2024/01/27  PCP: Lory Rough., PA-C  Admit date: 2024-01-27 Date of Death: 2024-02-07 Time of Death: 02/13/1013   History of present illness:  78 y.o. female with medical history significant for stage IV non-small cell lung cancer, hypertension, hyperlipidemia on palliative systemic chemotherapy presented with severe weakness, cough.  On presentation, she was tachycardic, leukopenic, pancytopenic, sodium of 125 with elevated lactic acid.  She was started on broad-spectrum antibiotics.  RSV/COVID/influenza PCR negative.  Oncology was consulted and patient was started on Granix .  ID was consulted and antibiotics were discontinued.  Subsequently, patient's condition did not improve.  She started spiking temperatures again and blood cultures grew Enterococcus.  ID was reconsulted: Patient was started on cefepime  and daptomycin .  Patient continued to remain neutropenic.  Palliative care following.  She had worsening oral intake and worsening oxygen requirement.  Family has decided to pursue comfort measures only.  She passed away on Feb 07, 2024 at 1014.  Final Diagnoses:  Comfort measures only  neutropenic fever Staph hemolyticus bacteremia: Most likely contaminant Enterococcus faecium bacteremia Hyponatremia Poor oral intake Hypokalemia Stage IV non-small cell lung cancer Pancytopenia GERD Hyperlipidemia Hypothyroidism   The results of significant diagnostics from this hospitalization (including imaging, microbiology, ancillary and laboratory) are listed below for reference.    Significant Diagnostic Studies: DG CHEST PORT 1 VIEW Result Date: 01/26/2024 CLINICAL DATA:  [id number] Aspiration into airway [id number] EXAM: PORTABLE CHEST - 1 VIEW COMPARISON:  CT 01/22/2024, and previous FINDINGS: Moderate hiatal hernia. Somewhat nodular opacities in the right mid and lower lung, increased since previous chest radiograph 2024-01-27. No overt  edema. Heart size and mediastinal contours are within normal limits. Aortic Atherosclerosis (ICD10-170.0). No effusion. Visualized bones unremarkable. IMPRESSION: 1. Increasing right mid and lower lung  disease. 2. Moderate hiatal hernia. Electronically Signed   By: Nicoletta Barrier M.D.   On: 01/26/2024 08:38   CT CHEST ABDOMEN PELVIS W CONTRAST Result Date: 01/22/2024 CLINICAL DATA:  Stage IV lung cancer and malignant pleural effusion EXAM: CT CHEST, ABDOMEN, AND PELVIS WITH CONTRAST TECHNIQUE: Multidetector CT imaging of the chest, abdomen and pelvis was performed following the standard protocol during bolus administration of intravenous contrast. RADIATION DOSE REDUCTION: This exam was performed according to the departmental dose-optimization program which includes automated exposure control, adjustment of the mA and/or kV according to patient size and/or use of iterative reconstruction technique. CONTRAST:  OMNIPAQUE  IOHEXOL  300 MG/ML  SOLN COMPARISON:  01-27-24, 11/12/2023, 10/19/2023 FINDINGS: CT CHEST FINDINGS Cardiovascular: The heart is enlarged without pericardial effusion. No evidence of thoracic aortic aneurysm or dissection. Atherosclerosis of the aorta and coronary vasculature. Mediastinum/Nodes: Stable mediastinal adenopathy, measuring 12 mm in the pretracheal region and 17 mm in the precarinal region in short axis. Thyroid , trachea, and esophagus are stable. Moderate hiatal hernia again noted. Lungs/Pleura: There are small bilateral pleural effusions, right greater than left. Masslike consolidation within the right lower lobe measuring 4.4 x 2.6 cm image 91/4 corresponds to region of increased metabolic activity on prior PET scan. There is new consolidation within the lingula and left lower lobe, as well as patchy airspace disease throughout the right upper lobe, which could reflect multifocal infection. No pneumothorax. Central airways are patent. Musculoskeletal: No acute or destructive bony  abnormalities. Reconstructed images demonstrate no additional findings. CT ABDOMEN PELVIS FINDINGS Hepatobiliary: No focal liver abnormality is seen. Status post cholecystectomy. No biliary dilatation. Pancreas: Unremarkable. No pancreatic ductal dilatation or surrounding inflammatory changes. Spleen: Normal in  size without focal abnormality. Adrenals/Urinary Tract: Stable nonspecific thickening of the left adrenal gland, favor adenoma or hyperplasia given lack of metabolic activity on prior PET scan. Right adrenal is unremarkable. The kidneys enhance normally. Moderate distention of the urinary bladder without filling defect or mucosal abnormality. Stomach/Bowel: No bowel obstruction or ileus. Large amount of retained stool within the rectal vault consistent with fecal impaction, with mild rectal wall thickening and perirectal fat stranding concerning for stercoral colitis. Colonic diverticulosis without evidence of acute diverticulitis. Postsurgical changes from prior partial colon resection and reanastomosis. Moderate hiatal hernia. Vascular/Lymphatic: Aortic atherosclerosis. No enlarged abdominal or pelvic lymph nodes. Reproductive: Uterus and bilateral adnexa are unremarkable. Other: No free fluid or free intraperitoneal gas. Evidence of prior ventral hernia repair, with wide diastasis of the rectus musculature again noted and unchanged since prior exam. Musculoskeletal: No acute or destructive bony abnormalities. IMPRESSION: 1. Masslike consolidation within the right lower lobe compatible with known history of lung cancer. This is decreased in size since prior study. 2. Small bilateral pleural effusions, right greater than left. 3. New multifocal bilateral airspace disease most pronounced in the right upper and left lower lobes, suspicious for pneumonia. 4. Stable borderline enlarged mediastinal lymph nodes, equivocal based on prior PET scan. 5. No evidence of distant metastases. 6. Large amount of retained  stool in the rectal vault consistent with fecal impaction, with superimposed findings suggesting stercoral colitis. 7. Diffuse colonic diverticulosis without diverticulitis. 8. Moderate hiatal hernia. 9.  Aortic Atherosclerosis (ICD10-I70.0). Electronically Signed   By: Bobbye Burrow M.D.   On: 01/22/2024 18:21   DG Chest Portable 1 View Result Date: 01/17/2024 CLINICAL DATA:  Cough and fever EXAM: PORTABLE CHEST 1 VIEW COMPARISON:  November 13, 2023 FINDINGS: Chronic bronchial changes of the right lower lobe and left lower lobe retrocardiac atelectasis. No consolidations No pleural effusions. Heart and mediastinum normal IMPRESSION: Chronic bronchial changes of the right lower lobe and left lower lobe retrocardiac atelectasis. Electronically Signed   By: Fredrich Jefferson M.D.   On: 01/17/2024 14:28    Microbiology: Recent Results (from the past 240 hours)  C Difficile Quick Screen w PCR reflex     Status: None   Collection Time: 01/19/24  9:19 AM   Specimen: STOOL  Result Value Ref Range Status   C Diff antigen NEGATIVE NEGATIVE Final   C Diff toxin NEGATIVE NEGATIVE Final   C Diff interpretation No C. difficile detected.  Final    Comment: Performed at [facility], [address] [place], [place] [address]  Culture, blood (Routine X 2) w Reflex to ID Panel     Status: None (Preliminary result)   Collection Time: 01/25/24  4:11 AM   Specimen: BLOOD LEFT FOREARM  Result Value Ref Range Status   Specimen Description   Final    BLOOD LEFT FOREARM Performed at [facility], [address] [place], [place] [address]    Special Requests   Final    BOTTLES DRAWN AEROBIC ONLY Blood Culture results may not be optimal due to an inadequate volume of blood received in culture bottles Performed at [facility], [address] [place], [place] [address]    Culture   Final    NO GROWTH 3 DAYS Performed at [facility], [address],  [place], [place] [address]    Report Status PENDING  Incomplete  Culture, blood (Routine X 2) w Reflex to ID Panel     Status:  Abnormal   Collection Time: 01/25/24  4:11 AM   Specimen: BLOOD LEFT HAND  Result Value Ref Range Status   Specimen Description   Final    BLOOD LEFT HAND Performed at [facility], [address] [place], [place] [address]    Special Requests   Final    BOTTLES DRAWN AEROBIC AND ANAEROBIC Blood Culture results may not be optimal due to an inadequate volume of blood received in culture bottles Performed at [facility], [address] [place], [place] [address]    Culture  Setup Time   Final    GRAM POSITIVE COCCI IN PAIRS IN CHAINS IN BOTH AEROBIC AND ANAEROBIC BOTTLES CRITICAL RESULT CALLED TO, READ BACK BY AND VERIFIED WITH: PHARMD E. JACKSON [id number] Performed at [facility], [address], [place], [place] [address]    Culture (A)  Final    ENTEROCOCCUS FAECIUM VANCOMYCIN  RESISTANT ENTEROCOCCUS    Report Status 01/27/2024 FINAL  Final   Organism ID, Bacteria ENTEROCOCCUS FAECIUM  Final      Susceptibility   Enterococcus faecium - MIC*    AMPICILLIN  >=32 RESISTANT Resistant     VANCOMYCIN  >=32 RESISTANT Resistant     GENTAMICIN SYNERGY SENSITIVE Sensitive     * ENTEROCOCCUS FAECIUM  Blood Culture ID Panel (Reflexed)     Status: Abnormal (Preliminary result)   Collection Time: 01/25/24  4:11 AM  Result Value Ref Range Status   Enterococcus faecalis NOT DETECTED NOT DETECTED Final   Enterococcus Faecium DETECTED (A) NOT DETECTED Final    Comment: CRITICAL RESULT CALLED TO, READ BACK BY AND VERIFIED WITH: PHARMD E. JACKSON [id number] FH    Listeria monocytogenes NOT DETECTED NOT DETECTED Final   Staphylococcus species NOT DETECTED NOT DETECTED Final   Staphylococcus aureus (BCID) PENDING NOT DETECTED Incomplete   Staphylococcus epidermidis PENDING NOT DETECTED Incomplete   Staphylococcus  lugdunensis PENDING NOT DETECTED Incomplete   Streptococcus species NOT DETECTED NOT DETECTED Final   Streptococcus agalactiae NOT DETECTED NOT DETECTED Final   Streptococcus pneumoniae NOT DETECTED NOT DETECTED Final   Streptococcus pyogenes NOT DETECTED NOT DETECTED Final   A.calcoaceticus-baumannii NOT DETECTED NOT DETECTED Final   Bacteroides fragilis NOT DETECTED NOT DETECTED Final   Enterobacterales NOT DETECTED NOT DETECTED Final   Enterobacter cloacae complex NOT DETECTED NOT DETECTED Final   Escherichia coli NOT DETECTED NOT DETECTED Final   Klebsiella aerogenes NOT DETECTED NOT DETECTED Final   Klebsiella oxytoca NOT DETECTED NOT DETECTED Final   Klebsiella pneumoniae NOT DETECTED NOT DETECTED Final   Proteus species NOT DETECTED NOT DETECTED Final   Salmonella species NOT DETECTED NOT DETECTED Final   Serratia marcescens NOT DETECTED NOT DETECTED Final   Haemophilus influenzae NOT DETECTED NOT DETECTED Final   Neisseria meningitidis NOT DETECTED NOT DETECTED Final   Pseudomonas aeruginosa NOT DETECTED NOT DETECTED Final   Stenotrophomonas maltophilia NOT DETECTED NOT DETECTED Final   Candida albicans NOT DETECTED NOT DETECTED Final   Candida auris NOT DETECTED NOT DETECTED Final   Candida glabrata NOT DETECTED NOT DETECTED Final   Candida krusei NOT DETECTED NOT DETECTED Final   Candida parapsilosis NOT DETECTED NOT DETECTED Final   Candida tropicalis NOT DETECTED NOT DETECTED Final   Cryptococcus neoformans/gattii NOT DETECTED NOT DETECTED Final   Vancomycin  resistance DETECTED (A) NOT DETECTED Final    Comment: CRITICAL RESULT CALLED TO, READ BACK BY AND VERIFIED WITH: PHARMD E. JACKSON [id number] FH Performed at [facility], [address] [place], [place] [address]   Urine Culture (for pregnant, neutropenic or urologic patients or patients with an indwelling urinary catheter)     Status: None   Collection Time: 01/26/24  5:30 PM   Specimen: Urine,  Catheterized  Result Value Ref Range Status   Specimen Description   Final    URINE, CATHETERIZED Performed at [facility] [address], [place], [place] [address]    Special Requests   Final    NONE Performed at [facility], [address], [place], [place] [address]    Culture   Final    NO GROWTH Performed at [facility], [address] [place], [place] [address]    Report Status 01/27/2024 FINAL  Final  Culture, blood (Routine X 2) w Reflex to ID Panel     Status: None (Preliminary result)   Collection Time: 01/27/24  3:46 AM   Specimen: BLOOD LEFT FOREARM  Result Value Ref Range Status   Specimen Description   Final    BLOOD LEFT FOREARM Performed at [facility], [address] [place], [place] [address]    Special Requests   Final    BOTTLES DRAWN AEROBIC AND ANAEROBIC Blood Culture results may not be optimal due to an inadequate volume of blood received in culture bottles Performed at [facility], [address], [place], [place] [address]    Culture   Final    NO GROWTH 1 DAY Performed at [facility], [address], [place], [place] [address]    Report Status PENDING  Incomplete     Labs: Basic Metabolic Panel: Recent Labs  Lab 01/23/24 0415 01/24/24 0406 01/25/24 0411 01/26/24 0252 01/27/24 0346  NA 132* 133* 135 139 143  K 2.2* 2.6* 3.0* 3.4* 3.4*  CL 91* 93* 95* 98 104  CO2 26 29 29 27 30   GLUCOSE 96 116* 101* 112* 104*  BUN 13 19 23  31* 42*  CREATININE 0.49 0.78 0.77 0.97 1.15*  CALCIUM 8.1* 8.0* 8.4* 8.5* 8.2*  MG  --  1.7 1.7 1.8 1.6*   Liver Function Tests: Recent Labs  Lab 01/22/24 0355 01/23/24 0415 01/24/24 0406 01/27/24 0346  AST 31 26 20 26   ALT 25 24 18 15   ALKPHOS 286* 267* 225* 138*  BILITOT 1.0 1.2 1.0 0.8  PROT 5.9* 6.6 6.1* 6.0*  ALBUMIN 2.0* 2.2* 1.9* 1.6*   No results for input(s): "LIPASE", "AMYLASE" in the last 168 hours. No results  for input(s): "AMMONIA" in the last 168 hours. CBC: Recent Labs  Lab 01/22/24 0355 01/23/24 0603 01/24/24 0406 01/25/24 0411 01/26/24 0252  WBC 0.5* 0.4* 0.4* 0.6* 0.4*  NEUTROABS 0.0* 0.0* 0.0* 0.0* 0.0*  HGB 7.5* 7.6* 7.3* 7.7* 7.5*  HCT 22.9* 21.5* 21.9* 22.9* 22.8*  MCV 93.5 90.3 92.4 93.1 94.6  PLT 16* 34* 35* 39* 57*   Cardiac Enzymes: No results for input(s): "CKTOTAL", "CKMB", "CKMBINDEX", "TROPONINI" in the last 168 hours. D-Dimer No results for input(s): "DDIMER" in the last 72 hours. BNP: Invalid input(s): "POCBNP" CBG: Recent Labs  Lab 01/26/24 2130  GLUCAP 104*   Anemia work up No results for input(s): "VITAMINB12", "FOLATE", "FERRITIN", "TIBC", "IRON", "RETICCTPCT" in the last 72 hours. Urinalysis    Component Value Date/Time   COLORURINE STRAW (A) 01/26/2024 1730   APPEARANCEUR CLEAR 01/26/2024 1730   LABSPEC 1.009 01/26/2024 1730   PHURINE 5.0 01/26/2024 1730  GLUCOSEU NEGATIVE 01/26/2024 1730   HGBUR MODERATE (A) 01/26/2024 1730   BILIRUBINUR NEGATIVE 01/26/2024 1730   KETONESUR NEGATIVE 01/26/2024 1730   PROTEINUR NEGATIVE 01/26/2024 1730   NITRITE NEGATIVE 01/26/2024 1730   LEUKOCYTESUR NEGATIVE 01/26/2024 1730   Sepsis Labs Recent Labs  Lab 01/23/24 0603 01/24/24 0406 01/25/24 0411 01/26/24 0252  WBC 0.4* 0.4* 0.6* 0.4*       SIGNED:  Audria Leather, MD  [facility] 02/05/2024, 11:43 AM

## 2024-02-24 NOTE — Telephone Encounter (Signed)
"   Please cancel all my mothers appts . She is going on comfort care /Hospice. Thank you to everyone who took care of her".

## 2024-02-24 NOTE — Progress Notes (Cosign Needed Addendum)
 Wasted 40cc from morphine  gtt into stericycle. Waste witnessed by Micheline Ahr. Kirsten Mckone, Connell Degree, RN

## 2024-02-24 DEATH — deceased

## 2024-02-27 ENCOUNTER — Other Ambulatory Visit

## 2024-03-05 ENCOUNTER — Ambulatory Visit: Admitting: Internal Medicine

## 2024-03-05 ENCOUNTER — Other Ambulatory Visit

## 2024-03-05 ENCOUNTER — Ambulatory Visit

## 2024-03-13 ENCOUNTER — Ambulatory Visit

## 2024-03-13 ENCOUNTER — Encounter: Admitting: Nutrition

## 2024-03-13 ENCOUNTER — Other Ambulatory Visit

## 2024-03-13 ENCOUNTER — Ambulatory Visit: Admitting: Internal Medicine

## 2024-04-05 ENCOUNTER — Other Ambulatory Visit: Payer: Self-pay | Admitting: Internal Medicine
